# Patient Record
Sex: Female | Born: 1990 | Race: Black or African American | Hispanic: No | Marital: Married | Smoking: Never smoker
Health system: Southern US, Community
[De-identification: ages and names within clinical notes are randomized; demographics above are authoritative.]

## PROBLEM LIST (undated history)

## (undated) ENCOUNTER — Inpatient Hospital Stay (HOSPITAL_COMMUNITY): Payer: Self-pay

## (undated) DIAGNOSIS — J302 Other seasonal allergic rhinitis: Secondary | ICD-10-CM

## (undated) DIAGNOSIS — G4730 Sleep apnea, unspecified: Secondary | ICD-10-CM

## (undated) DIAGNOSIS — M1990 Unspecified osteoarthritis, unspecified site: Secondary | ICD-10-CM

## (undated) DIAGNOSIS — N189 Chronic kidney disease, unspecified: Secondary | ICD-10-CM

## (undated) DIAGNOSIS — F419 Anxiety disorder, unspecified: Secondary | ICD-10-CM

## (undated) DIAGNOSIS — R011 Cardiac murmur, unspecified: Secondary | ICD-10-CM

## (undated) DIAGNOSIS — Z8619 Personal history of other infectious and parasitic diseases: Secondary | ICD-10-CM

## (undated) DIAGNOSIS — J45909 Unspecified asthma, uncomplicated: Secondary | ICD-10-CM

## (undated) DIAGNOSIS — I10 Essential (primary) hypertension: Secondary | ICD-10-CM

## (undated) DIAGNOSIS — E119 Type 2 diabetes mellitus without complications: Secondary | ICD-10-CM

## (undated) DIAGNOSIS — F32A Depression, unspecified: Secondary | ICD-10-CM

## (undated) DIAGNOSIS — K219 Gastro-esophageal reflux disease without esophagitis: Secondary | ICD-10-CM

## (undated) DIAGNOSIS — F909 Attention-deficit hyperactivity disorder, unspecified type: Secondary | ICD-10-CM

## (undated) DIAGNOSIS — D49511 Neoplasm of unspecified behavior of right kidney: Secondary | ICD-10-CM

## (undated) DIAGNOSIS — T8859XA Other complications of anesthesia, initial encounter: Secondary | ICD-10-CM

## (undated) DIAGNOSIS — E785 Hyperlipidemia, unspecified: Secondary | ICD-10-CM

## (undated) DIAGNOSIS — K760 Fatty (change of) liver, not elsewhere classified: Secondary | ICD-10-CM

## (undated) DIAGNOSIS — E559 Vitamin D deficiency, unspecified: Secondary | ICD-10-CM

## (undated) HISTORY — PX: ABDOMINAL HYSTERECTOMY: SHX81

## (undated) HISTORY — PX: TUBAL LIGATION: SHX77

## (undated) HISTORY — PX: ESOPHAGOGASTRODUODENOSCOPY: SHX1529

## (undated) HISTORY — DX: Neoplasm of unspecified behavior of right kidney: D49.511

## (undated) HISTORY — DX: Personal history of other infectious and parasitic diseases: Z86.19

## (undated) HISTORY — PX: TONSILLECTOMY: SUR1361

## (undated) HISTORY — PX: UPPER GASTROINTESTINAL ENDOSCOPY: SHX188

## (undated) HISTORY — DX: Attention-deficit hyperactivity disorder, unspecified type: F90.9

## (undated) HISTORY — DX: Essential (primary) hypertension: I10

## (undated) HISTORY — PX: CHOLECYSTECTOMY: SHX55

## (undated) HISTORY — DX: Hyperlipidemia, unspecified: E78.5

---
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
## 2010-06-23 ENCOUNTER — Encounter: Payer: Self-pay | Admitting: Family Medicine

## 2010-06-25 ENCOUNTER — Other Ambulatory Visit (HOSPITAL_COMMUNITY)
Admission: RE | Admit: 2010-06-25 | Discharge: 2010-06-25 | Disposition: A | Discharge status: Home / Self Care | Payer: Medicaid Other | Source: Ambulatory Visit | Attending: Obstetrics and Gynecology | Admitting: Obstetrics and Gynecology

## 2010-06-25 ENCOUNTER — Other Ambulatory Visit: Payer: Self-pay | Admitting: Obstetrics and Gynecology

## 2010-06-25 ENCOUNTER — Encounter (INDEPENDENT_AMBULATORY_CARE_PROVIDER_SITE_OTHER): Payer: Self-pay | Admitting: Obstetrics and Gynecology

## 2010-06-25 DIAGNOSIS — R87619 Unspecified abnormal cytological findings in specimens from cervix uteri: Secondary | ICD-10-CM | POA: Insufficient documentation

## 2010-06-25 LAB — POCT PREGNANCY, URINE: Preg Test, Ur: NEGATIVE

## 2010-07-16 ENCOUNTER — Ambulatory Visit: Payer: Self-pay | Admitting: Physician Assistant

## 2010-07-16 DIAGNOSIS — R87612 Low grade squamous intraepithelial lesion on cytologic smear of cervix (LGSIL): Secondary | ICD-10-CM

## 2015-10-19 DIAGNOSIS — R1012 Left upper quadrant pain: Secondary | ICD-10-CM | POA: Diagnosis not present

## 2015-10-19 DIAGNOSIS — R1084 Generalized abdominal pain: Secondary | ICD-10-CM | POA: Diagnosis not present

## 2015-11-16 ENCOUNTER — Ambulatory Visit: Payer: 59 | Admitting: General Practice

## 2015-11-16 ENCOUNTER — Encounter: Payer: Self-pay | Admitting: Family Medicine

## 2015-11-16 DIAGNOSIS — Z3201 Encounter for pregnancy test, result positive: Secondary | ICD-10-CM

## 2015-11-16 LAB — POCT PREGNANCY, URINE: Preg Test, Ur: POSITIVE — AB

## 2015-11-16 NOTE — Progress Notes (Signed)
Patient here for UPT today. UPT positive. Patient reports first positive test 11/08/15. LMP 10/10/15 EDD 07/16/16. Patient reports having nexplanon removed in February-March and August was her first period. Patient states she has type 2 diabetes and is currently not on any medications or vitamins. Patient states she was on metformin in the past but when she got pregnant she had to be placed on insulin. Patient reports fasting 135ish. Recommended to patient she come see Maggie our diabetes educator in 2 weeks and to see Diane that day for ultrasound confirmation to ensure she is not further along. New OB visit in 5-6 weeks. Patient verbalized understanding to all. Provided new OB packet & recommended she begin PNV. Patient had no questions

## 2015-11-27 ENCOUNTER — Other Ambulatory Visit (HOSPITAL_COMMUNITY): Payer: Self-pay | Admitting: Radiology

## 2015-11-27 ENCOUNTER — Emergency Department (HOSPITAL_COMMUNITY)
Admission: EM | Admit: 2015-11-27 | Discharge: 2015-11-28 | Disposition: A | Discharge status: Home / Self Care | Payer: 59 | Attending: Emergency Medicine | Admitting: Emergency Medicine

## 2015-11-27 ENCOUNTER — Emergency Department (HOSPITAL_COMMUNITY): Payer: 59

## 2015-11-27 ENCOUNTER — Encounter (HOSPITAL_COMMUNITY): Payer: Self-pay

## 2015-11-27 DIAGNOSIS — R110 Nausea: Secondary | ICD-10-CM | POA: Diagnosis not present

## 2015-11-27 DIAGNOSIS — E119 Type 2 diabetes mellitus without complications: Secondary | ICD-10-CM | POA: Diagnosis not present

## 2015-11-27 DIAGNOSIS — Z3A01 Less than 8 weeks gestation of pregnancy: Secondary | ICD-10-CM | POA: Diagnosis not present

## 2015-11-27 DIAGNOSIS — R102 Pelvic and perineal pain: Secondary | ICD-10-CM | POA: Diagnosis not present

## 2015-11-27 DIAGNOSIS — O219 Vomiting of pregnancy, unspecified: Secondary | ICD-10-CM | POA: Diagnosis not present

## 2015-11-27 DIAGNOSIS — O24311 Unspecified pre-existing diabetes mellitus in pregnancy, first trimester: Secondary | ICD-10-CM | POA: Diagnosis not present

## 2015-11-27 DIAGNOSIS — R072 Precordial pain: Secondary | ICD-10-CM | POA: Diagnosis not present

## 2015-11-27 DIAGNOSIS — O26891 Other specified pregnancy related conditions, first trimester: Secondary | ICD-10-CM | POA: Diagnosis present

## 2015-11-27 DIAGNOSIS — O99411 Diseases of the circulatory system complicating pregnancy, first trimester: Secondary | ICD-10-CM | POA: Diagnosis not present

## 2015-11-27 DIAGNOSIS — O3481 Maternal care for other abnormalities of pelvic organs, first trimester: Secondary | ICD-10-CM | POA: Diagnosis not present

## 2015-11-27 HISTORY — DX: Type 2 diabetes mellitus without complications: E11.9

## 2015-11-27 LAB — COMPREHENSIVE METABOLIC PANEL
ALT: 29 U/L (ref 14–54)
ANION GAP: 9 (ref 5–15)
AST: 23 U/L (ref 15–41)
Albumin: 4.1 g/dL (ref 3.5–5.0)
Alkaline Phosphatase: 58 U/L (ref 38–126)
BUN: 7 mg/dL (ref 6–20)
CHLORIDE: 104 mmol/L (ref 101–111)
CO2: 22 mmol/L (ref 22–32)
CREATININE: 0.47 mg/dL (ref 0.44–1.00)
Calcium: 9 mg/dL (ref 8.9–10.3)
Glucose, Bld: 148 mg/dL — ABNORMAL HIGH (ref 65–99)
Potassium: 3.7 mmol/L (ref 3.5–5.1)
SODIUM: 135 mmol/L (ref 135–145)
Total Bilirubin: 0.4 mg/dL (ref 0.3–1.2)
Total Protein: 7.3 g/dL (ref 6.5–8.1)

## 2015-11-27 LAB — CBC WITH DIFFERENTIAL/PLATELET
BASOS PCT: 0 %
Basophils Absolute: 0 10*3/uL (ref 0.0–0.1)
EOS ABS: 0.2 10*3/uL (ref 0.0–0.7)
EOS PCT: 1 %
HCT: 41.7 % (ref 36.0–46.0)
HEMOGLOBIN: 13.5 g/dL (ref 12.0–15.0)
Lymphocytes Relative: 31 %
Lymphs Abs: 4 10*3/uL (ref 0.7–4.0)
MCH: 27.7 pg (ref 26.0–34.0)
MCHC: 32.4 g/dL (ref 30.0–36.0)
MCV: 85.5 fL (ref 78.0–100.0)
MONOS PCT: 6 %
Monocytes Absolute: 0.8 10*3/uL (ref 0.1–1.0)
NEUTROS PCT: 62 %
Neutro Abs: 8 10*3/uL — ABNORMAL HIGH (ref 1.7–7.7)
PLATELETS: 257 10*3/uL (ref 150–400)
RBC: 4.88 MIL/uL (ref 3.87–5.11)
RDW: 13.8 % (ref 11.5–15.5)
WBC: 13 10*3/uL — ABNORMAL HIGH (ref 4.0–10.5)

## 2015-11-27 LAB — LIPASE, BLOOD: LIPASE: 19 U/L (ref 11–51)

## 2015-11-27 LAB — I-STAT TROPONIN, ED: TROPONIN I, POC: 0 ng/mL (ref 0.00–0.08)

## 2015-11-27 LAB — I-STAT CG4 LACTIC ACID, ED: Lactic Acid, Venous: 1.56 mmol/L (ref 0.5–1.9)

## 2015-11-27 LAB — I-STAT BETA HCG BLOOD, ED (MC, WL, AP ONLY)

## 2015-11-27 MED ORDER — ONDANSETRON HCL 4 MG PO TABS: 4.0000 mg | ORAL_TABLET | Freq: Four times a day (QID) | ORAL | 0 refills | Status: DC

## 2015-11-27 MED ORDER — ONDANSETRON 4 MG PO TBDP
4.0000 mg | ORAL_TABLET | Freq: Once | ORAL | Status: AC
  Administered 2015-11-27: 4 mg via ORAL
  Filled 2015-11-27: qty 1

## 2015-11-27 MED ORDER — ACETAMINOPHEN 325 MG PO TABS
650.0000 mg | ORAL_TABLET | Freq: Once | ORAL | Status: AC
  Administered 2015-11-27: 650 mg via ORAL
  Filled 2015-11-27: qty 2

## 2015-11-27 NOTE — ED Triage Notes (Signed)
Pt presents with onset of mid-sternal chest pain  That began this morning.  +shortness of breath and diaphoresis.  Pt is pregnant, LMP 8/12

## 2015-11-27 NOTE — Discharge Instructions (Signed)
Return to [facility] for [place] and HCG in 14 days or arrange one with your OB-GYN doctor

## 2015-11-27 NOTE — Discharge Planning (Signed)
EDCM reviewed discharging chart for possible CM needs.  No needs identified.    

## 2015-11-27 NOTE — ED Provider Notes (Signed)
[place] DEPT Provider Note   CSN: [id number] Arrival date & time: 11/27/15  [id number]     History   Chief Complaint Chief Complaint  Patient presents with  . Chest Pain    HPI Michelle Lamb is a 25 y.o. female.  HPI Patient presents emergency room with some midsternal chest pain which began this morning.  Patient is currently rock's May 6 [redacted] weeks pregnant.  She has no history of cardiac disease.  Denies nausea or diaphoresis.  Said she had some mild shortness of breath.  Also has had slight headache.  Denies fever chills. Past Medical History:  Diagnosis Date  . Diabetes mellitus without complication ([place])     There are no active problems to display for this patient.   Past Surgical History:  Procedure Laterality Date  . CHOLECYSTECTOMY    . TONSILLECTOMY      OB History    Gravida Para Term Preterm AB Living   1             SAB TAB Ectopic Multiple Live Births                   Home Medications    Prior to Admission medications   Medication Sig Start Date End Date Taking? Authorizing Provider  ondansetron (ZOFRAN) 4 MG tablet Take 1 tablet (4 mg total) by mouth every 6 (six) hours. 11/27/15   Leonard Schwartz, MD    Family History History reviewed. No pertinent family history.  Social History Social History  Substance Use Topics  . Smoking status: Never Smoker  . Smokeless tobacco: Never Used  . Alcohol use No     Allergies   Review of patient's allergies indicates no known allergies.   Review of Systems Review of Systems  All other systems reviewed and are negative Physical Exam Updated Vital Signs BP 115/62 (BP Location: Right Arm)   Pulse 67   Temp 98.7 F (37.1 C) (Oral)   Resp 20   Ht 5\' 3"  (1.6 m)   Wt 284 lb (128.8 kg)   LMP 10/10/2015 (Exact Date)   SpO2 100%   BMI 50.31 kg/m   Physical Exam  Physical Exam  Nursing note and vitals reviewed. Constitutional: She is oriented to person, place, and time. She appears  well-developed and well-nourished. No distress.  HENT:  Head: Normocephalic and atraumatic.  Eyes: Pupils are equal, round, and reactive to light.  Neck: Normal range of motion.  Cardiovascular: Normal rate and intact distal pulses.   Pulmonary/Chest: No respiratory distress.  Abdominal: Normal appearance.  Patient has a gravid uterus.  No focal tenderness to palpation.   Musculoskeletal: Normal range of motion.  Neurological: She is alert and oriented to person, place, and time. No cranial nerve deficit.  Skin: Skin is warm and dry. No rash noted.  Psychiatric: She has a normal mood and affect. Her behavior is normal.   ED Treatments / Results  Labs (all labs ordered are listed, but only abnormal results are displayed) Labs Reviewed  COMPREHENSIVE METABOLIC PANEL - Abnormal; Notable for the following:       Result Value   Glucose, Bld 148 (*)    All other components within normal limits  CBC WITH DIFFERENTIAL/PLATELET - Abnormal; Notable for the following:    WBC 13.0 (*)    Neutro Abs 8.0 (*)    All other components within normal limits  I-STAT BETA HCG BLOOD, ED (MC, [facility], AP ONLY) - Abnormal; Notable for the following:  I-stat hCG, quantitative >2,000.0 (*)    All other components within normal limits  LIPASE, BLOOD  I-STAT TROPOININ, ED  I-STAT CG4 LACTIC ACID, ED    EKG  EKG Interpretation  Date/Time:  Friday November 27 2015 09:54:52 EDT Ventricular Rate:  98 PR Interval:    QRS Duration: 87 QT Interval:  328 QTC Calculation: 419 R Axis:   14 Text Interpretation:  Sinus rhythm Low voltage, precordial leads Baseline wander in lead(s) V3 V4 V5 V6 No previous tracing Confirmed by Yamilet Mcfayden  MD, Billyjoe Go ([id number]) on 11/27/2015 9:56:56 AM       Radiology US Ob Comp Less 14 Wks  Result Date: 11/27/2015 CLINICAL DATA:  Pelvic pain. EXAM: OBSTETRIC <14 WK [place] AND TRANSVAGINAL OB US TECHNIQUE: Both transabdominal and transvaginal ultrasound examinations were performed for  complete evaluation of the gestation as well as the maternal uterus, adnexal regions, and pelvic cul-de-sac. Transvaginal technique was performed to assess early pregnancy. COMPARISON:  No prior . FINDINGS: Intrauterine gestational sac: Single Yolk sac:  Not present Embryo:  Not present Cardiac Activity: Not present MSD: 1.3 cm 6 w   1  d Subchorionic hemorrhage: Tiny subchorionic hemorrhage cannot be excluded. Maternal uterus/adnexae: Small 1.5 cm small cyst right ovary, most likely corpus luteal cyst IMPRESSION: 1. Intrauterine gestational sac. No fetal pole noted. Probable early intrauterine gestational sac, but no yolk sac, fetal pole, or cardiac activity yet visualized. Recommend follow-up quantitative B-HCG levels and follow-up US in 14 days to confirm and assess viability and to exclude ectopic pregnancy. This recommendation follows SRU consensus guidelines: Diagnostic Criteria for Nonviable Pregnancy Early in the First Trimester. [place] Med [id number]. 2.  Tiny subchorionic hemorrhage cannot be excluded. 3. 1.5 cm small complex cyst right ovary, most likely corpus luteal cyst. Electronically Signed   By: Marcello Moores  Register   On: 11/27/2015 12:54   US Ob Transvaginal  Result Date: 11/27/2015 CLINICAL DATA:  Pelvic pain. EXAM: OBSTETRIC <14 WK [place] AND TRANSVAGINAL OB US TECHNIQUE: Both transabdominal and transvaginal ultrasound examinations were performed for complete evaluation of the gestation as well as the maternal uterus, adnexal regions, and pelvic cul-de-sac. Transvaginal technique was performed to assess early pregnancy. COMPARISON:  No prior . FINDINGS: Intrauterine gestational sac: Single Yolk sac:  Not present Embryo:  Not present Cardiac Activity: Not present MSD: 1.3 cm 6 w   1  d Subchorionic hemorrhage: Tiny subchorionic hemorrhage cannot be excluded. Maternal uterus/adnexae: Small 1.5 cm small cyst right ovary, most likely corpus luteal cyst IMPRESSION: 1. Intrauterine gestational  sac. No fetal pole noted. Probable early intrauterine gestational sac, but no yolk sac, fetal pole, or cardiac activity yet visualized. Recommend follow-up quantitative B-HCG levels and follow-up US in 14 days to confirm and assess viability and to exclude ectopic pregnancy. This recommendation follows SRU consensus guidelines: Diagnostic Criteria for Nonviable Pregnancy Early in the First Trimester. [place] [employer] Med [id number]. 2.  Tiny subchorionic hemorrhage cannot be excluded. 3. 1.5 cm small complex cyst right ovary, most likely corpus luteal cyst. Electronically Signed   By: Marcello Moores  Register   On: 11/27/2015 12:54    Procedures Procedures (including critical care time)  Medications Ordered in ED Medications  acetaminophen (TYLENOL) tablet 650 mg (650 mg Oral Given 11/27/15 1010)  ondansetron (ZOFRAN-ODT) disintegrating tablet 4 mg (4 mg Oral Given 11/27/15 1025)  ondansetron (ZOFRAN-ODT) disintegrating tablet 4 mg (4 mg Oral Given 11/27/15 1318)     Initial Impression / Assessment and Plan /  ED Course  I have reviewed the triage vital signs and the nursing notes.  Pertinent labs & imaging results that were available during my care of the patient were reviewed by me and considered in my medical decision making (see chart for details).  Clinical Course  Discussed the findings of the ultrasound with patient.  Instructed her to follow-up with her OB/GYN doctor for repeat ultrasound in 2 weeks or go to maternity admissions unit at [facility] hospital here in [place] for repeat ultrasound.    Final Clinical Impressions(s) / ED Diagnoses   Final diagnoses:  Nausea and vomiting of pregnancy, antepartum    New Prescriptions New Prescriptions   ONDANSETRON (ZOFRAN) 4 MG TABLET    Take 1 tablet (4 mg total) by mouth every 6 (six) hours.     Leonard Schwartz, MD 11/27/15 1324

## 2015-11-30 ENCOUNTER — Ambulatory Visit: Payer: 59 | Admitting: *Deleted

## 2015-11-30 ENCOUNTER — Encounter: Payer: 59 | Attending: Obstetrics and Gynecology | Admitting: Dietician

## 2015-11-30 DIAGNOSIS — O24119 Pre-existing diabetes mellitus, type 2, in pregnancy, unspecified trimester: Secondary | ICD-10-CM

## 2015-11-30 DIAGNOSIS — Z713 Dietary counseling and surveillance: Secondary | ICD-10-CM | POA: Diagnosis not present

## 2015-11-30 MED ORDER — GLUCOSE BLOOD VI STRP: ORAL_STRIP | 12 refills | Status: DC

## 2015-11-30 MED ORDER — BLOOD GLUCOSE METER KIT: PACK | 0 refills | Status: DC

## 2015-11-30 MED ORDER — ACCU-CHEK SOFT TOUCH LANCETS MISC: 12 refills | Status: DC

## 2015-11-30 NOTE — Progress Notes (Signed)
Michelle Lamb is a 25 yr old G32P3.  Notes she had GDM with her third child 22 months ago.  Currently working full time as a Quarry manager at [employer] " a lot of nausea and limited things that I can eat.  Finding that she is having palpations at work and was in the ED last Thursday (09/28). Notes that she developed type 2 diabetes following her delivery, and her diabetes is treated by Metformin.  Has stopped taking the Metformin.  I related that Metformin is used by ladies with GDM in our clinic and to plan to discuss medication with her MD at next visit.  She is to call the clinic and if her fasting glucose levels stay elevated above 90, she is to ask for an earlier appointment to see the MD. Last pregnancy, was followed by a practice in [place].   Completed review of GDM and its effects on the baby and mom. Review of the post partem self-care measures to help with weight loss and prevention of or the limiting of type 2 DM. Completed review of factors affecting blood glucose. Recommended walking 15 minutes after each meal. Review of blood glucose monitoring procedure.  She has obtained pregnancy Medicaid and we reviewed the Accu-Chek Aviva Plus meter.  Will have prescription posted to the [facility] in [place] on [address] for the meter, strips and lancets. Instructed to monitor fasting and 2 hr postprandial glucose levels,  Record and to bring her meter and glucose log to all clinic appointments. On random check this morning fasting level is 143 mg/dl.  She had a large fried dinner late last evening. Completed review of the carb restricted diet, carb counting and meal scheduling.  Tried to relate these factors in light of the nausea she is experiencing. Will follow as needed. Michelle Brae Gartman, RN, RD, LDN

## 2015-12-01 ENCOUNTER — Telehealth: Payer: Self-pay | Admitting: *Deleted

## 2015-12-01 NOTE — Telephone Encounter (Signed)
Patient left message on nurse line. Stated she was in [facility] ED on Fri for chest pain. They did and u/s and wanted her to f/u in 2 weeks for another. Wants to know if this can wait till the 30th when she has her doctor appointment if possible. Please return call.

## 2015-12-10 NOTE — Telephone Encounter (Signed)
Called patient, no answer- left message stating we are trying to reach you to see if you still need assistance, if so please call back.

## 2015-12-18 ENCOUNTER — Telehealth: Payer: Self-pay | Admitting: *Deleted

## 2015-12-18 NOTE — Telephone Encounter (Signed)
Pt left message stating that she needs the flu shot for her job and wants to know if she can receive it prior to her new Ob appt on 10/30. She stated that a message can be left on her voice mail.  I called pt back and left message stating that she may call back on Monday to schedule appt for flu shot prior to her appt on 10/30 or she can wait and receive it on that [place].

## 2015-12-24 DIAGNOSIS — O200 Threatened abortion: Secondary | ICD-10-CM | POA: Diagnosis not present

## 2015-12-24 DIAGNOSIS — Z3A01 Less than 8 weeks gestation of pregnancy: Secondary | ICD-10-CM | POA: Diagnosis not present

## 2015-12-24 DIAGNOSIS — Z3A10 10 weeks gestation of pregnancy: Secondary | ICD-10-CM | POA: Diagnosis not present

## 2015-12-28 ENCOUNTER — Ambulatory Visit (INDEPENDENT_AMBULATORY_CARE_PROVIDER_SITE_OTHER): Payer: 59 | Admitting: Obstetrics and Gynecology

## 2015-12-28 ENCOUNTER — Ambulatory Visit: Payer: Self-pay

## 2015-12-28 ENCOUNTER — Other Ambulatory Visit: Payer: Self-pay | Admitting: Obstetrics and Gynecology

## 2015-12-28 ENCOUNTER — Ambulatory Visit (HOSPITAL_COMMUNITY)
Admission: RE | Admit: 2015-12-28 | Discharge: 2015-12-28 | Disposition: A | Discharge status: Home / Self Care | Payer: 59 | Source: Ambulatory Visit | Attending: Obstetrics and Gynecology | Admitting: Obstetrics and Gynecology

## 2015-12-28 ENCOUNTER — Telehealth: Payer: Self-pay | Admitting: Obstetrics and Gynecology

## 2015-12-28 ENCOUNTER — Encounter: Payer: Self-pay | Admitting: Obstetrics and Gynecology

## 2015-12-28 VITALS — BP 144/100 | HR 92 | Wt 297.6 lb

## 2015-12-28 DIAGNOSIS — O10911 Unspecified pre-existing hypertension complicating pregnancy, first trimester: Secondary | ICD-10-CM | POA: Insufficient documentation

## 2015-12-28 DIAGNOSIS — O0991 Supervision of high risk pregnancy, unspecified, first trimester: Secondary | ICD-10-CM

## 2015-12-28 DIAGNOSIS — O200 Threatened abortion: Secondary | ICD-10-CM | POA: Insufficient documentation

## 2015-12-28 DIAGNOSIS — O208 Other hemorrhage in early pregnancy: Secondary | ICD-10-CM | POA: Insufficient documentation

## 2015-12-28 DIAGNOSIS — O3680X0 Pregnancy with inconclusive fetal viability, not applicable or unspecified: Secondary | ICD-10-CM

## 2015-12-28 DIAGNOSIS — E669 Obesity, unspecified: Secondary | ICD-10-CM | POA: Diagnosis not present

## 2015-12-28 DIAGNOSIS — O24911 Unspecified diabetes mellitus in pregnancy, first trimester: Secondary | ICD-10-CM | POA: Diagnosis not present

## 2015-12-28 DIAGNOSIS — Z3A01 Less than 8 weeks gestation of pregnancy: Secondary | ICD-10-CM | POA: Diagnosis not present

## 2015-12-28 DIAGNOSIS — O99210 Obesity complicating pregnancy, unspecified trimester: Secondary | ICD-10-CM | POA: Diagnosis not present

## 2015-12-28 DIAGNOSIS — O0990 Supervision of high risk pregnancy, unspecified, unspecified trimester: Secondary | ICD-10-CM | POA: Insufficient documentation

## 2015-12-28 DIAGNOSIS — Z124 Encounter for screening for malignant neoplasm of cervix: Secondary | ICD-10-CM | POA: Diagnosis not present

## 2015-12-28 DIAGNOSIS — Z6841 Body Mass Index (BMI) 40.0 and over, adult: Secondary | ICD-10-CM | POA: Insufficient documentation

## 2015-12-28 DIAGNOSIS — Z113 Encounter for screening for infections with a predominantly sexual mode of transmission: Secondary | ICD-10-CM | POA: Diagnosis not present

## 2015-12-28 DIAGNOSIS — Z8619 Personal history of other infectious and parasitic diseases: Secondary | ICD-10-CM | POA: Insufficient documentation

## 2015-12-28 DIAGNOSIS — O99211 Obesity complicating pregnancy, first trimester: Secondary | ICD-10-CM | POA: Diagnosis not present

## 2015-12-28 DIAGNOSIS — O209 Hemorrhage in early pregnancy, unspecified: Secondary | ICD-10-CM | POA: Diagnosis not present

## 2015-12-28 DIAGNOSIS — N939 Abnormal uterine and vaginal bleeding, unspecified: Secondary | ICD-10-CM | POA: Diagnosis present

## 2015-12-28 HISTORY — DX: Body Mass Index (BMI) 40.0 and over, adult: Z684

## 2015-12-28 LAB — POCT URINALYSIS DIP (DEVICE)
BILIRUBIN URINE: NEGATIVE
LEUKOCYTES UA: NEGATIVE
Nitrite: POSITIVE — AB
Protein, ur: 100 mg/dL — AB
SPECIFIC GRAVITY, URINE: 1.025 (ref 1.005–1.030)
UROBILINOGEN UA: 1 mg/dL (ref 0.0–1.0)
pH: 5.5 (ref 5.0–8.0)

## 2015-12-28 LAB — TSH: TSH: 0.79 mIU/L

## 2015-12-28 IMAGING — US US OB TRANSVAGINAL
1 series · 15 of 28 positions shown · non-contrast
Comparison: [DATE]

CLINICAL DATA: Pelvic pain and cramping. Vaginal bleeding.
Threatened abortion. First trimester pregnancy with inconclusive
fetal viability.

EXAM:
TRANSVAGINAL OB ULTRASOUND
TECHNIQUE: Transvaginal ultrasound was performed for complete evaluation of the
gestation as well as the maternal uterus, adnexal regions, and
pelvic cul-de-sac.

[Series 1: us ob transvaginal · 15 of 61 slices shown]
[im 1/61]
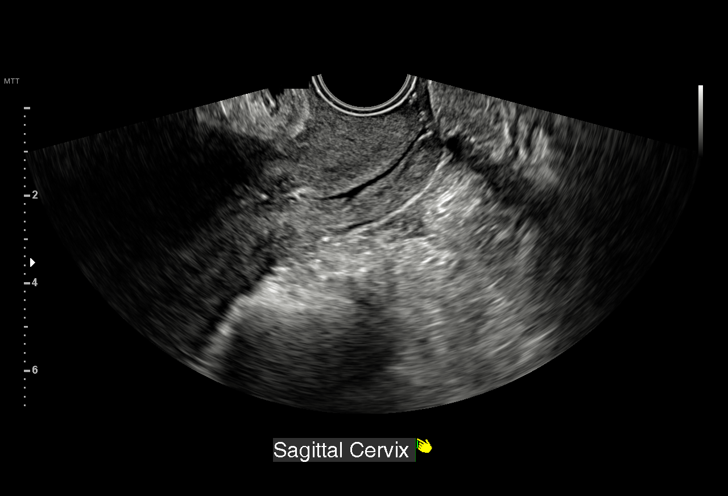
[im 5/61]
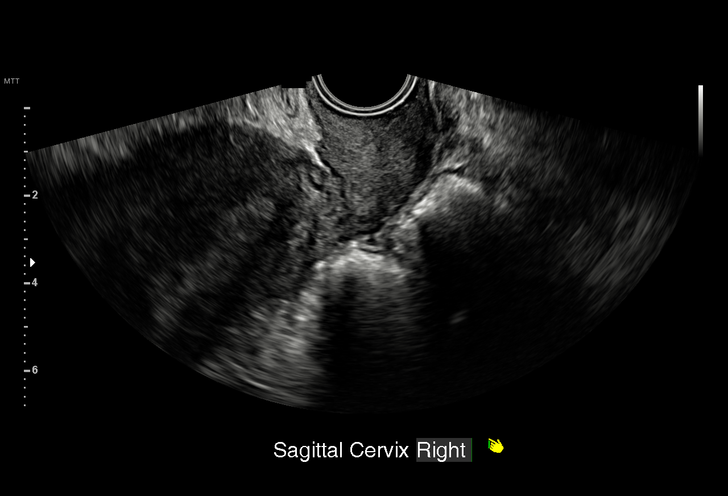
[im 9/61]
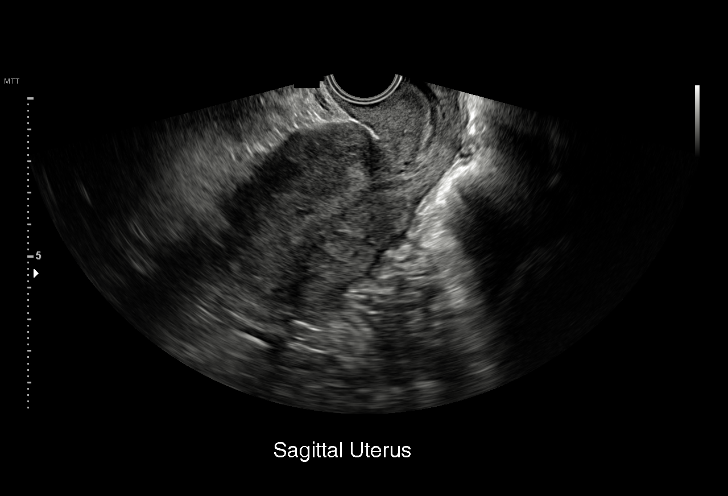
[im 14/61]
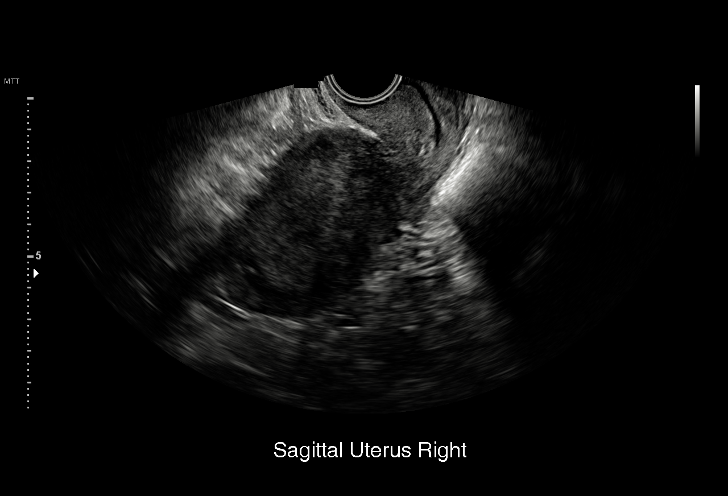
[im 18/61]
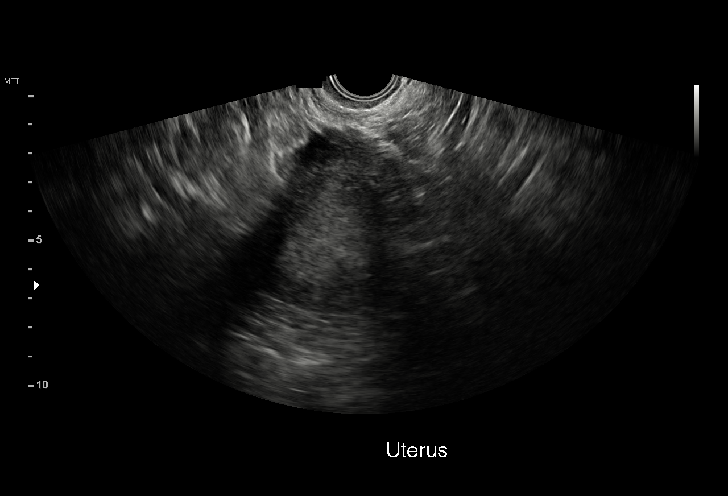
[im 23/61]
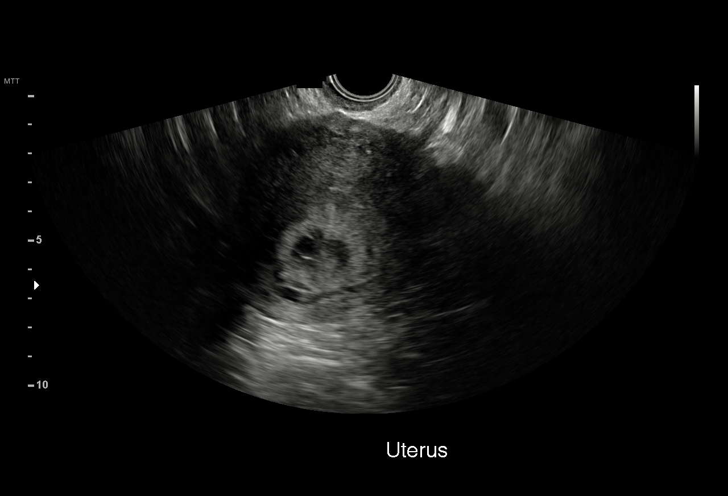
[im 27/61]
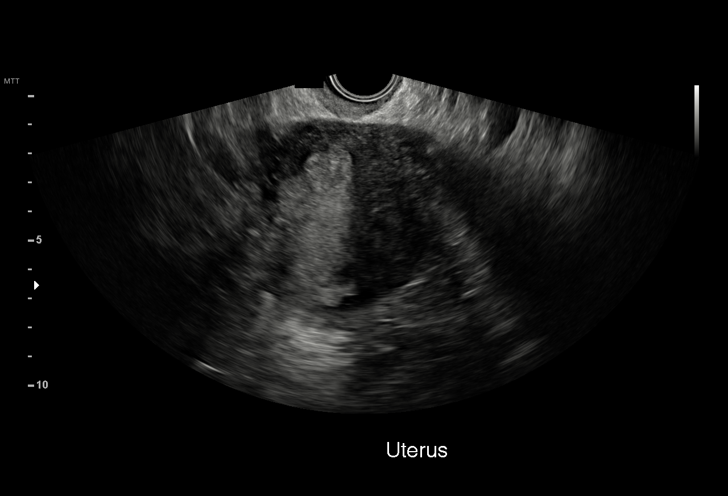
[im 32/61]
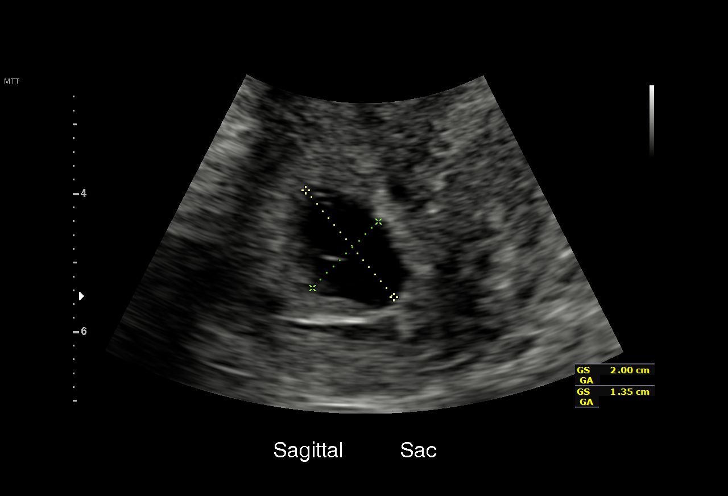
[im 34/61]
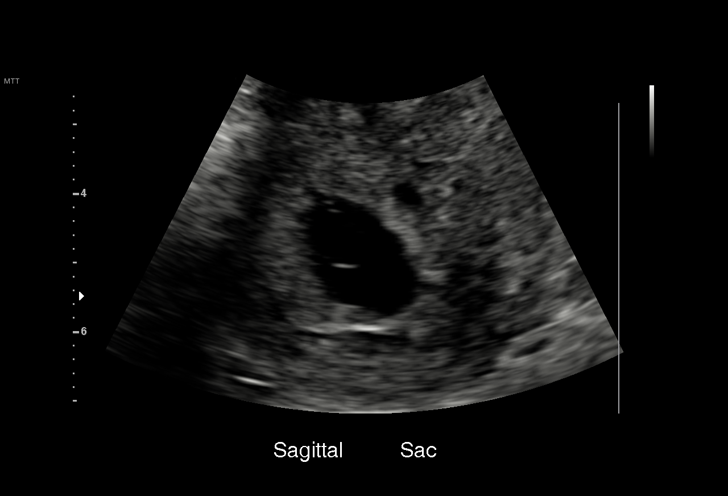
[im 38/61]
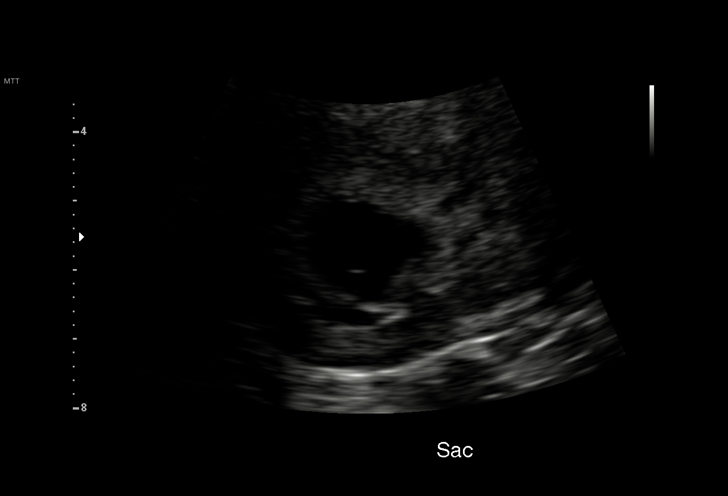
[im 43/61]
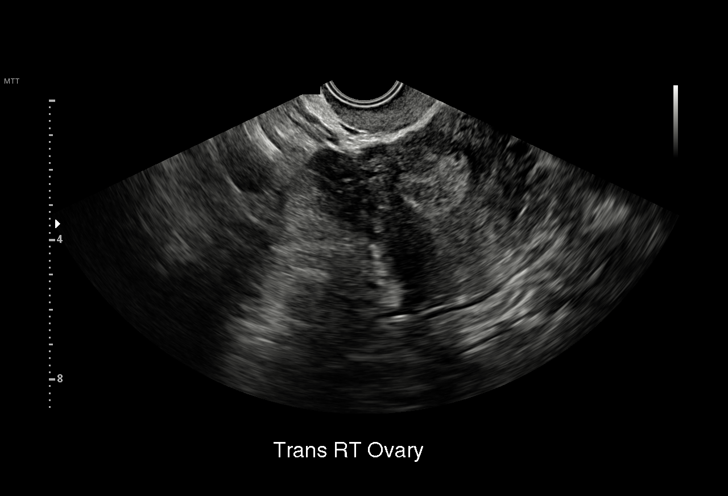
[im 47/61]
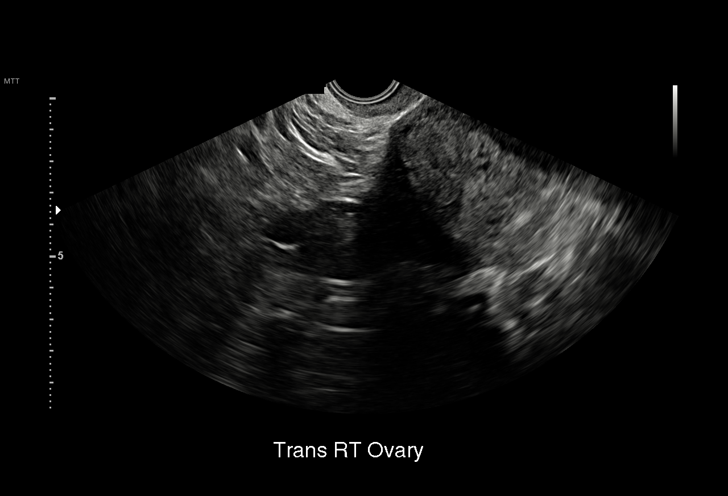
[im 52/61]
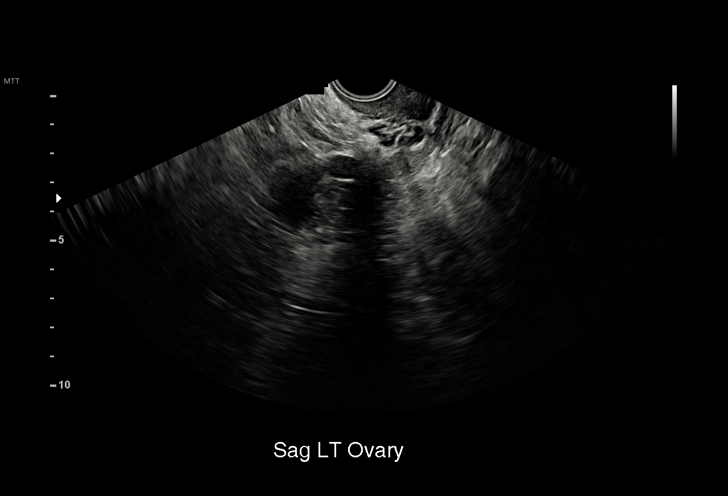
[im 56/61]
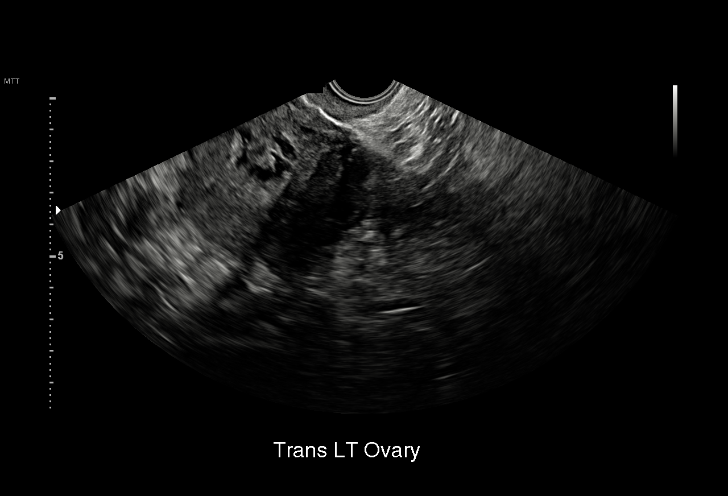
[im 61/61]
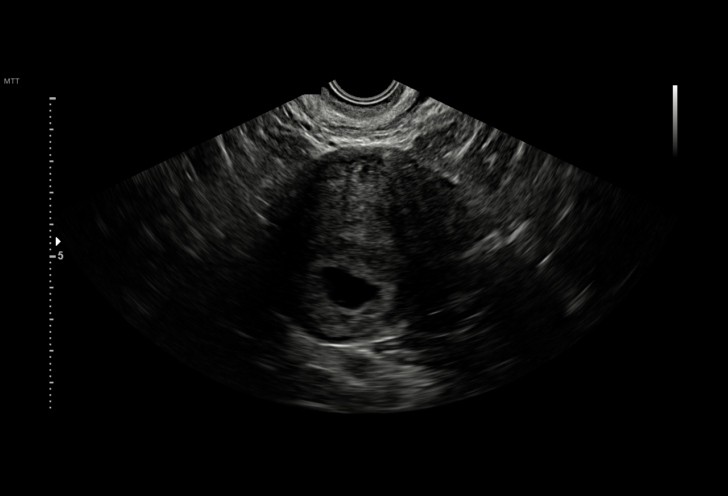

[15 of 28 positions shown; findings below may reference images not displayed]

FINDINGS: Intrauterine gestational sac: Single ; irregular shape

Yolk sac:  Abnormally enlarged yolk sac versus amnion

Embryo:  None visualized

MSD: 17  mm   6 w   4  d

Subchorionic hemorrhage: Small to moderate subchorionic hemorrhage
seen inferior to the gestational sac.

Maternal uterus/adnexae: Both ovaries are normal in appearance. No
mass or free fluid identified.
IMPRESSION: Single abnormal appearing intrauterine gestational sac shows lack of
interval progression and no embryo since prior study approximately 1
month ago. Findings meet definitive criteria for failed pregnancy.
This follows SRU consensus guidelines: Diagnostic Criteria for
Nonviable Pregnancy Early in the First Trimester. N Engl J Med

## 2015-12-28 MED ORDER — CEPHALEXIN 500 MG PO CAPS: 500.0000 mg | ORAL_CAPSULE | Freq: Four times a day (QID) | ORAL | 0 refills | Status: DC

## 2015-12-28 MED ORDER — CEPHALEXIN 500 MG PO CAPS
500.0000 mg | ORAL_CAPSULE | Freq: Three times a day (TID) | ORAL | 0 refills | Status: DC
Start: 2015-12-28 — End: 2015-12-28

## 2015-12-28 NOTE — Telephone Encounter (Signed)
GYN Telephone Note 12/28/2015 1547  D/w her re: failed pregnancy diagnosis and options including expectant vs medical vs surgical and she would like to do surgical.  Request sent to office for suction d&c scheduling and ER precautions given to her. She believes she is O pos and denies any h/o needing a shot b/c of her blood type.  Durene Romans MD Attending Center for [employer])

## 2015-12-28 NOTE — Progress Notes (Addendum)
New OB Note  12/31/2015   Clinic: Center for [facility]  Chief Complaint: NOB  Transfer of Care Patient: no  History of Present Illness: Ms. Stoffers is a 25 y.o. [id number] @ 11/2 weeks ([place] 07/16/16, based on Patient's last menstrual period was 10/10/2015 (exact date).=6wk u/s), with the above CC. Preg complicated by has Supervision of high-risk pregnancy; BMI 50.0-59.9, adult ([place]); Obesity in pregnancy; Chronic hypertension complicating or reason for care during pregnancy, first trimester; Diabetes mellitus type 2 affecting pregnancy in first trimester; Threatened abortion; and History of herpes simplex infection (serology only) on her problem list.   Her periods were: irregular after removal of her nexplanon in early 2017 She was using no method when she conceived.  She has Negative signs or symptoms of nausea/vomiting of pregnancy. She has Positive signs or symptoms of miscarriage or preterm labor: patient went to [facility] one week ago with non painful VB which is scant to small amount but continuing. She said that they drew blood and did an u/s but didn't tell her what they saw On any different medications around the time she conceived/early pregnancy: No  History of varicella: Yes  She denies any chest pain or SOB.  ROS: A 12-point review of systems was performed and negative, except as stated in the above HPI.  OBGYN History: As per HPI. OB History  Gravida Para Term Preterm AB Living  5 3 3   1 3   SAB TAB Ectopic Multiple Live Births  1       3    # Outcome Date GA Lbr Len/2nd Weight Sex Delivery Anes PTL Lv  5 Current           4 Term 02/12/14 [redacted]w[redacted]d  6 lb 12 oz (3.062 kg) F Vag-Spont EPI N LIV  3 SAB 2015          2 Term 08/06/08 [redacted]w[redacted]d  6 lb 5 oz (2.863 kg) F Vag-Spont EPI N LIV  1 Term 04/02/07 [redacted]w[redacted]d  5 lb 5 oz (2.41 kg) F Vag-Spont EPI Y LIV     Complications: Preeclampsia,Diabetes mellitus complicating pregnancy    Obstetric Comments  H/o GDM and HTN  in her last pregnancy    Any issues with any prior pregnancies: no Any prior children are healthy, doing well, without any problems or issues: yes History of pap smears: Yes. Last pap smear 2015 (negative). H/o CIN in 2012 History of STIs: Yes   Past Medical History: Past Medical History:  Diagnosis Date  . Benign essential HTN   . Diabetes mellitus without complication ([place])    TYPE 2 LAST DOSE FRIDAY OCTOBER 27 (METFORMIN)  . History of herpes simplex infection   . Hyperlipidemia     Past Surgical History: Past Surgical History:  Procedure Laterality Date  . CHOLECYSTECTOMY    . TONSILLECTOMY      Family History:  Family History  Problem Relation Age of Onset  . Hypertension Mother   . Cancer Maternal Grandmother     stomach cancer  . Depression Maternal Grandfather   . Cancer Maternal Grandfather     leukemia  . Depression Paternal Grandmother    She denies any female cancers, bleeding or blood clotting disorders.  She denies any history of mental retardation, birth defects or genetic disorders in her or the FOB's history  Social History:  Social History   Social History  . Marital status: Single    Spouse name: N/A  . Number of children: N/A  .  Years of education: N/A   Occupational History  . Not on file.   Social History Main Topics  . Smoking status: Never Smoker  . Smokeless tobacco: Never Used  . Alcohol use Yes     Comment: OCCASIONAL  . Drug use: No  . Sexual activity: Yes    Birth control/ protection: None     Comment: Nexplanon removed early 2017   Other Topics Concern  . Not on file   Social History Narrative  . No narrative on file  CNA with [employer]  Allergy: No Known Allergies  Health Maintenance:  Mammogram Up to Date: not applicable  Current Outpatient Medications: PNV  Physical Exam:   BP (!) 144/100   Pulse 92   Wt 297 lb 9.6 oz (135 kg)   LMP 10/10/2015 (Exact Date)   BMI 52.72 kg/m  Body mass index is 52.72  kg/m. FHTs: see below (none seen)  General appearance: Well nourished, well developed female in no acute distress.  Neck:  Supple, normal appearance, and no thyromegaly  Cardiovascular: S1, S2 normal, no murmur, rub or gallop, regular rate and rhythm Respiratory:  Clear to auscultation bilateral. Normal respiratory effort Abdomen: positive bowel sounds and no masses, hernias; diffusely non tender to palpation, non distended Breasts: breasts appear normal, no suspicious masses, no skin or nipple changes or axillary nodes, and normal inspection. Neuro/Psych:  Normal mood and affect.  Skin:  Warm and dry.  Lymphatic:  No inguinal lymphadenopathy.   Pelvic exam: is limited by body habitus EGBUS: within normal limits, Vagina: within normal limits and with scant amount of old pink mixed discharge  in the vault, Cervix: normal appearing cervix without discharge or lesions, closed/long/high, but no active bleeding. Cervix anterior and nttp Uterus:  Not palpable, and Adnexa:  normal adnexa and no mass, fullness, tenderness  Laboratory: none  Imaging:  As above. No records from [facility] from last week Bedside u/s (transabd) done after exam: ?GS but no fetus and uterus doesn't appear 11wks per ultrasonographer in clinic  Assessment: ?incomplete AB. Pt stable  Plan: 1. Supervision of high risk pregnancy in first trimester Routine care. Pt amenable to genetic screening but will hold off prenatal labs, etc until formal TVUS is done. +nitrites so will send in keflex - GC/Chlamydia probe amp ([place])not at [facility] - Urine Culture - Pain Mgmt, Profile 6 Conf w/o mM, U  2. BMI 50.0-59.9, adult ([facility]) - Protein / Creatinine Ratio, Urine - TSH  3.  Chronic hypertension complicating or reason for care during pregnancy, first trimester Patient states she was on labetalol in the past. Follow up BP at next visit and start meds PRN and based on u/s today Start baby ASA after 12wks if viable  pregnancy and no longer having bleeding Baseline cmp negative. Will get baseline PC ratio and TSH. 10/2015 ER ECG normal  4. Diabetes mellitus type 2 affecting pregnancy in first trimester Pt states supplies after early dm education visit not sent into pharmacy but patient didn't let us know Supplies sent in and RTC 1wk to f/u BS and BP - HgB A1c  5. Threatened abortion F/u viability scan today. D/w pt very concerning for miscarriage. Type and screen ordered. Will f/u results and call pt for plan of care discussion.   6. H/o HSV + serology Prodromal s/s precautions given Start ppx at 35-36wks  Problem list reviewed and updated.  Follow up in 1 weeks.  >50% of 30 min visit spent on counseling  and coordination of care.     Durene Romans MD Attending Center for [facility])

## 2015-12-28 NOTE — Progress Notes (Signed)
Initial prenatal labs today Prenatal education packet given Urine positive for nitrites

## 2015-12-29 ENCOUNTER — Telehealth: Payer: Self-pay | Admitting: General Practice

## 2015-12-29 LAB — PROTEIN / CREATININE RATIO, URINE
Creatinine, Urine: 120 mg/dL (ref 20–320)
Protein Creatinine Ratio: 292 mg/g creat — ABNORMAL HIGH (ref 21–161)
Total Protein, Urine: 35 mg/dL — ABNORMAL HIGH (ref 5–24)

## 2015-12-29 LAB — HEMOGLOBIN A1C
Hgb A1c MFr Bld: 7.2 % — ABNORMAL HIGH (ref <5.7)
Mean Plasma Glucose: 160 mg/dL

## 2015-12-29 LAB — GC/CHLAMYDIA PROBE AMP (~~LOC~~) NOT AT ARMC
CHLAMYDIA, DNA PROBE: NEGATIVE
NEISSERIA GONORRHEA: NEGATIVE

## 2015-12-29 LAB — ABO AND RH: RH TYPE: POSITIVE

## 2015-12-29 NOTE — Telephone Encounter (Signed)
Patient called into front office wanting to know when her surgery will be & also wanted to let someone know she has been feeling sick/nauseous all morning. Patient reports increase in pain & feeling hot flashes/cold all day. Spoke with Dr Kennon Rounds who recommended patient go to [facility] for evaluation. Discussed with patient. Patient verbalized understanding & had no questions

## 2015-12-30 ENCOUNTER — Other Ambulatory Visit: Payer: Self-pay | Admitting: Obstetrics and Gynecology

## 2015-12-30 ENCOUNTER — Encounter (HOSPITAL_COMMUNITY): Payer: Self-pay | Admitting: *Deleted

## 2015-12-30 LAB — PAIN MGMT, PROFILE 6 CONF W/O MM, U
6 ACETYLMORPHINE: NEGATIVE ng/mL (ref <10)
ALCOHOL METABOLITES: NEGATIVE ng/mL (ref <500)
Amphetamines: NEGATIVE ng/mL (ref <500)
BARBITURATES: NEGATIVE ng/mL (ref <300)
Benzodiazepines: NEGATIVE ng/mL (ref <100)
COCAINE METABOLITE: NEGATIVE ng/mL (ref <150)
Creatinine: 101.3 mg/dL (ref >=20.0)
METHADONE METABOLITE: NEGATIVE ng/mL (ref <100)
Marijuana Metabolite: NEGATIVE ng/mL (ref <20)
OXYCODONE: NEGATIVE ng/mL (ref <100)
Opiates: NEGATIVE ng/mL (ref <100)
Oxidant: NEGATIVE ug/mL (ref <200)
PH: 7 (ref 4.5–9.0)
PHENCYCLIDINE: NEGATIVE ng/mL (ref <25)
Please note:: 0

## 2015-12-30 LAB — URINE CULTURE

## 2015-12-31 ENCOUNTER — Encounter (HOSPITAL_COMMUNITY): Payer: Self-pay | Admitting: *Deleted

## 2015-12-31 ENCOUNTER — Encounter (HOSPITAL_COMMUNITY)
Admission: RE | Disposition: A | Discharge status: Home / Self Care | Payer: Self-pay | Source: Ambulatory Visit | Attending: Obstetrics and Gynecology

## 2015-12-31 ENCOUNTER — Ambulatory Visit (HOSPITAL_COMMUNITY): Payer: 59 | Admitting: Anesthesiology

## 2015-12-31 ENCOUNTER — Ambulatory Visit (HOSPITAL_COMMUNITY): Payer: 59

## 2015-12-31 ENCOUNTER — Ambulatory Visit (HOSPITAL_COMMUNITY)
Admission: RE | Admit: 2015-12-31 | Discharge: 2015-12-31 | Disposition: A | Discharge status: Home / Self Care | Payer: 59 | Source: Ambulatory Visit | Attending: Obstetrics and Gynecology | Admitting: Obstetrics and Gynecology

## 2015-12-31 DIAGNOSIS — O021 Missed abortion: Secondary | ICD-10-CM | POA: Diagnosis not present

## 2015-12-31 DIAGNOSIS — O034 Incomplete spontaneous abortion without complication: Secondary | ICD-10-CM | POA: Insufficient documentation

## 2015-12-31 DIAGNOSIS — O26891 Other specified pregnancy related conditions, first trimester: Secondary | ICD-10-CM | POA: Insufficient documentation

## 2015-12-31 DIAGNOSIS — Z3A11 11 weeks gestation of pregnancy: Secondary | ICD-10-CM | POA: Diagnosis not present

## 2015-12-31 DIAGNOSIS — O2341 Unspecified infection of urinary tract in pregnancy, first trimester: Secondary | ICD-10-CM | POA: Diagnosis not present

## 2015-12-31 DIAGNOSIS — O24911 Unspecified diabetes mellitus in pregnancy, first trimester: Secondary | ICD-10-CM | POA: Diagnosis not present

## 2015-12-31 DIAGNOSIS — Z3A01 Less than 8 weeks gestation of pregnancy: Secondary | ICD-10-CM | POA: Insufficient documentation

## 2015-12-31 DIAGNOSIS — Z6841 Body Mass Index (BMI) 40.0 and over, adult: Secondary | ICD-10-CM | POA: Diagnosis not present

## 2015-12-31 DIAGNOSIS — O99211 Obesity complicating pregnancy, first trimester: Secondary | ICD-10-CM | POA: Diagnosis not present

## 2015-12-31 DIAGNOSIS — E785 Hyperlipidemia, unspecified: Secondary | ICD-10-CM | POA: Diagnosis not present

## 2015-12-31 DIAGNOSIS — O161 Unspecified maternal hypertension, first trimester: Secondary | ICD-10-CM | POA: Diagnosis not present

## 2015-12-31 DIAGNOSIS — N719 Inflammatory disease of uterus, unspecified: Secondary | ICD-10-CM | POA: Diagnosis not present

## 2015-12-31 HISTORY — PX: DILATION AND CURETTAGE OF UTERUS: SHX78

## 2015-12-31 LAB — GLUCOSE, CAPILLARY
GLUCOSE-CAPILLARY: 111 mg/dL — AB (ref 65–99)
Glucose-Capillary: 127 mg/dL — ABNORMAL HIGH (ref 65–99)

## 2015-12-31 LAB — CBC
HCT: 38.6 % (ref 36.0–46.0)
Hemoglobin: 13.1 g/dL (ref 12.0–15.0)
MCH: 28 pg (ref 26.0–34.0)
MCHC: 33.9 g/dL (ref 30.0–36.0)
MCV: 82.5 fL (ref 78.0–100.0)
PLATELETS: 239 10*3/uL (ref 150–400)
RBC: 4.68 MIL/uL (ref 3.87–5.11)
RDW: 13.9 % (ref 11.5–15.5)
WBC: 12 10*3/uL — AB (ref 4.0–10.5)

## 2015-12-31 IMAGING — US US OB TRANSVAGINAL
1 series · 15 of 25 positions shown · non-contrast
Comparison: [DATE]

CLINICAL DATA: Incomplete abortion. By LMP patient is 11 weeks 3
days. LMP [DATE]. Patient is gravida 5 para 3 AB 1.

EXAM:
TRANSVAGINAL OB ULTRASOUND
TECHNIQUE: Transvaginal ultrasound was performed for complete evaluation of the
gestation as well as the maternal uterus, adnexal regions, and
pelvic cul-de-sac.

[Series 1: us ob transvaginal · 15 of 25 slices shown]
[im 1/25]
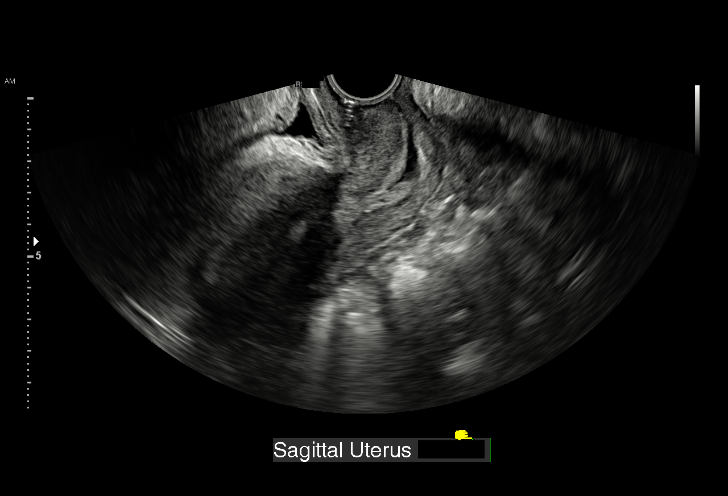
[im 3/25]
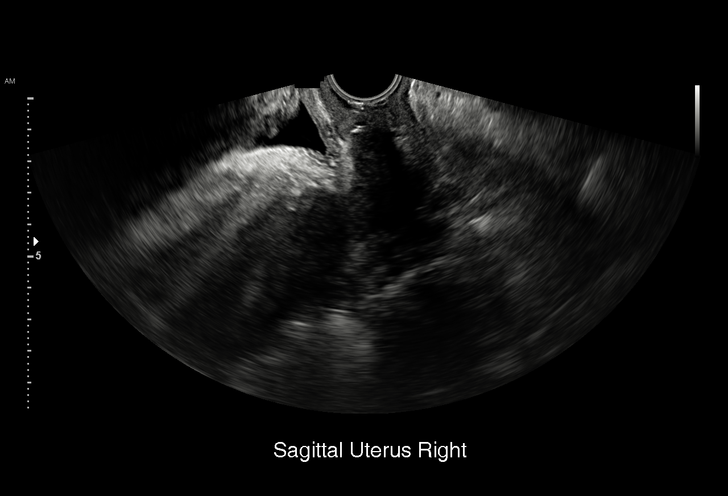
[im 5/25]
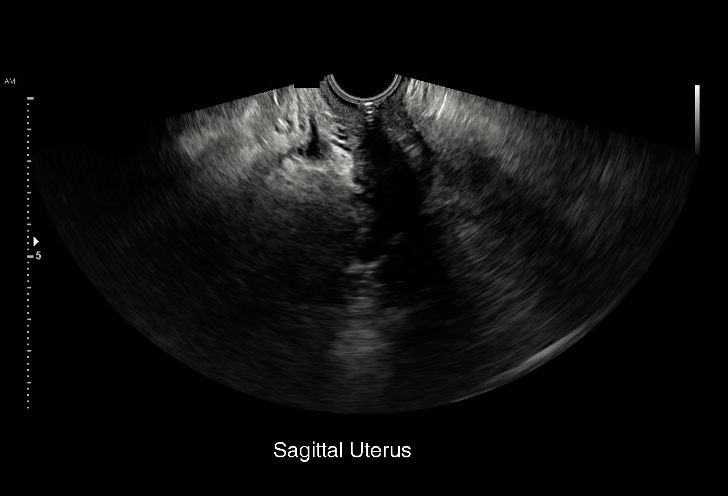
[im 6/25]
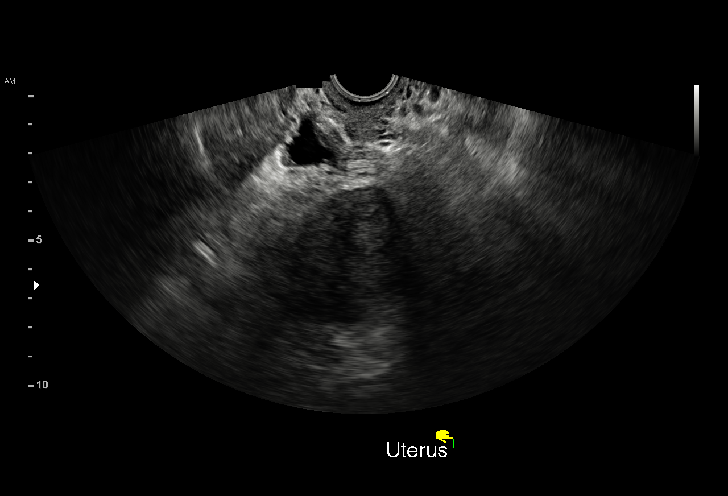
[im 8/25]
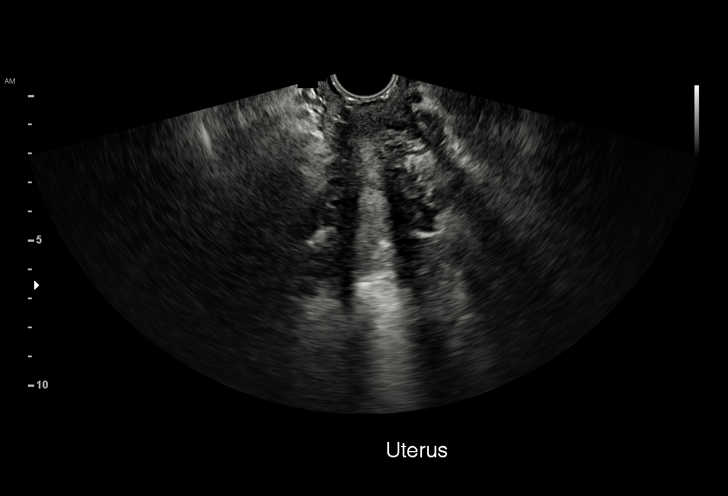
[im 10/25]
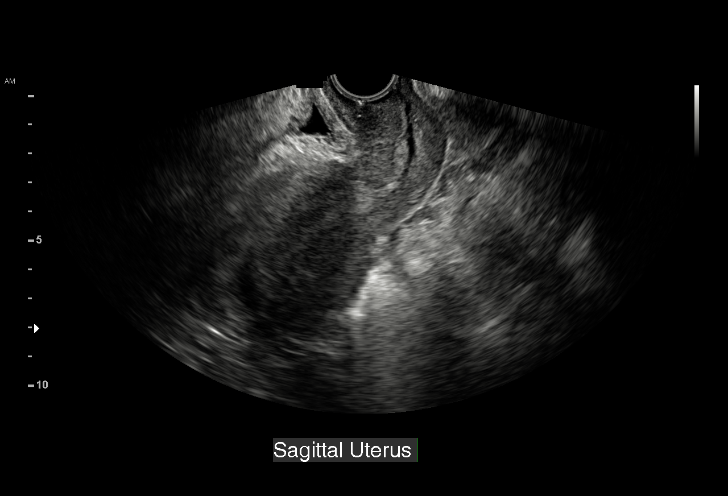
[im 11/25]
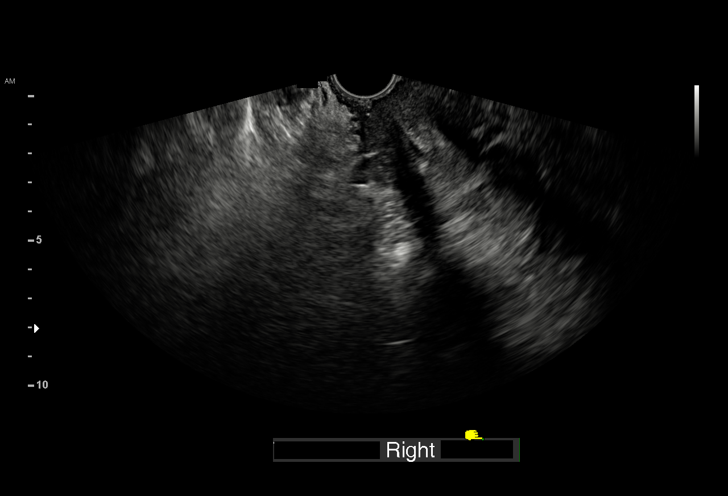
[im 13/25]
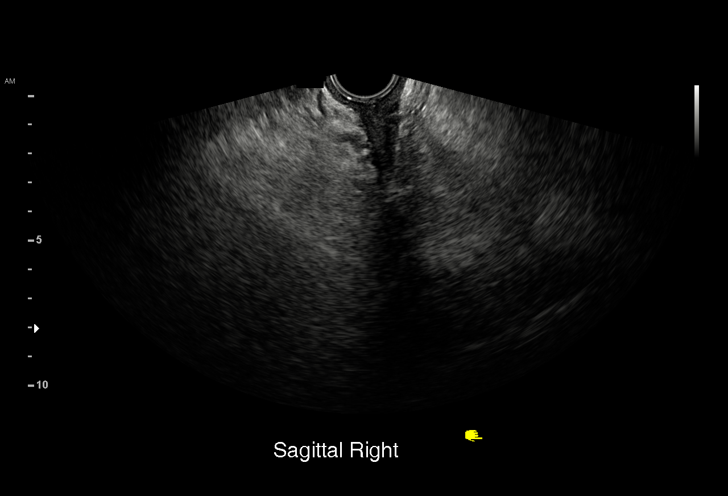
[im 15/25]
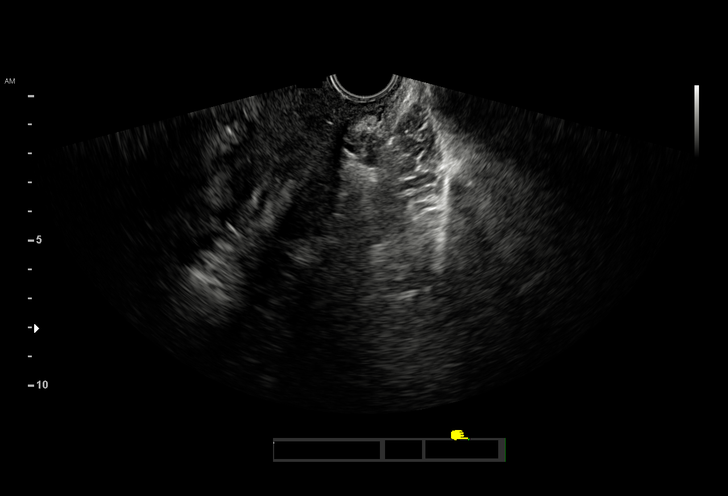
[im 16/25]
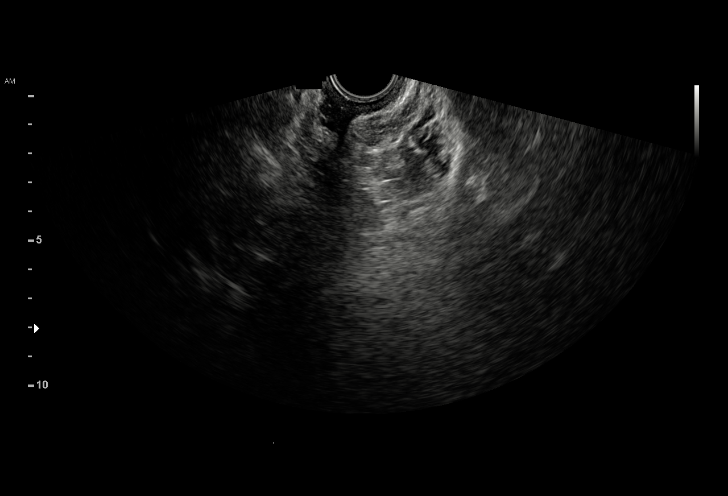
[im 18/25]
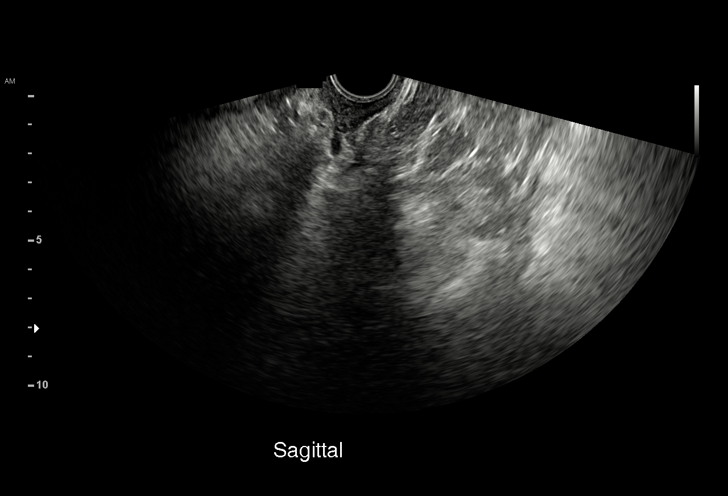
[im 20/25]
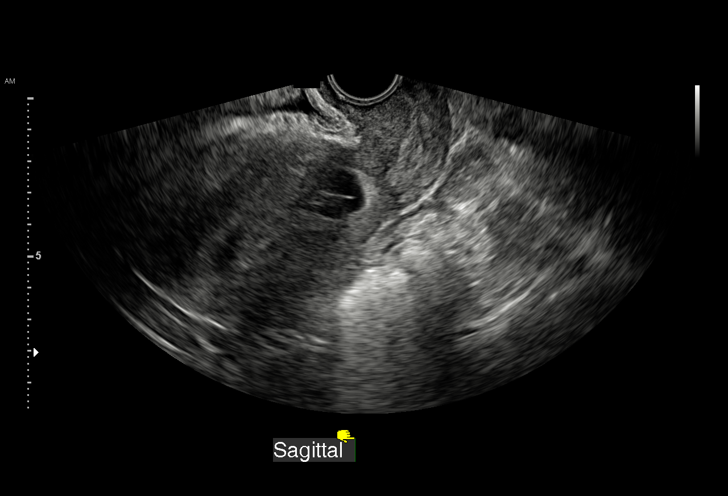
[im 21/25]
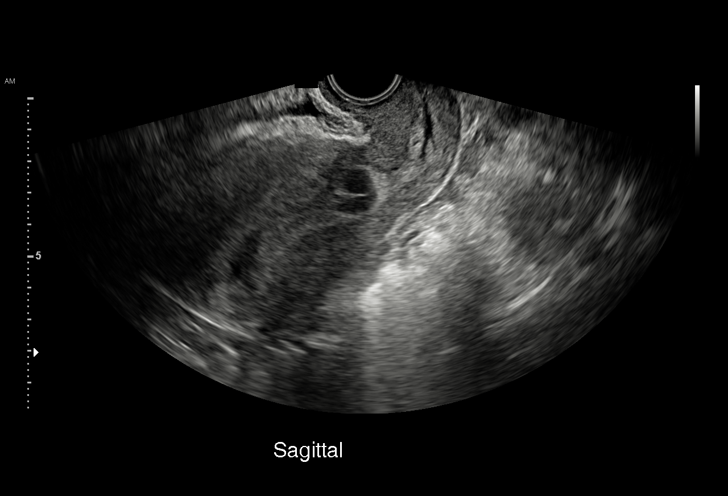
[im 23/25]
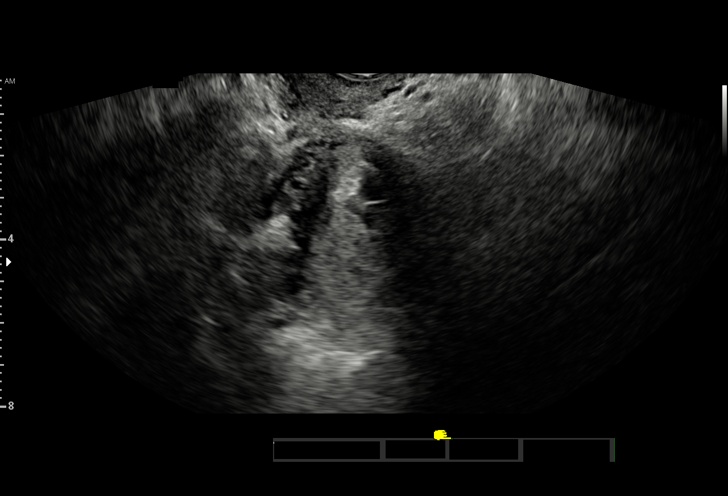
[im 25/25]
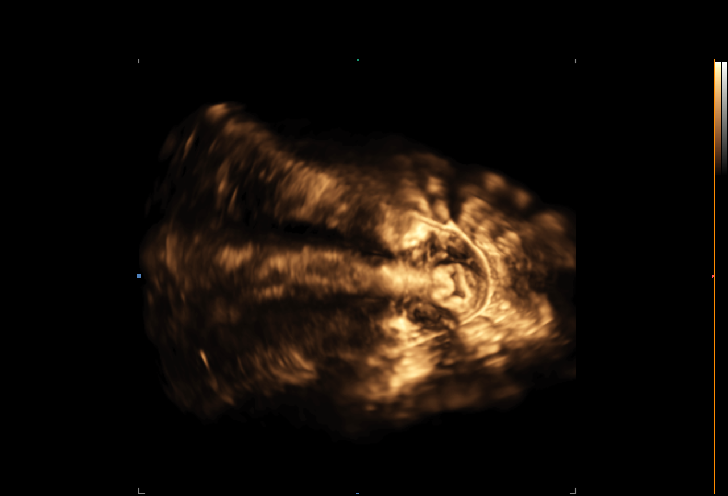

[15 of 25 positions shown; findings below may reference images not displayed]

FINDINGS: Intrauterine gestational sac: Possible ; there is a cystic structure
within the lower uterine segment endometrial canal. This could
represent the gestational sac image previously but now in the lower
uterine location. Images provided are inadequate to be confirmatory.
Study quality is degraded by patient tolerance for the exam.

Yolk sac:  Not seen

Embryo:  Not seen

Cardiac Activity: Absent

Subchorionic hemorrhage:  Fluid identified within the endometrium

Maternal uterus/adnexae: Ovaries are not visualized.
IMPRESSION: 1. Study today is quite limited by patient's tolerance for the exam.
2. There is a sac-like structure in the lower uterine endometrial
canal. There is a small amount of fluid in the upper uterine canal,
possibly indicating gestational sac in the upper canal. Detail is
too limited to resolve its location.
3. Consider transabdominal images and/or follow-up imaging when the
patient is able.

## 2015-12-31 SURGERY — DILATION AND CURETTAGE
Anesthesia: Epidural | Site: Vagina

## 2015-12-31 MED ORDER — LIDOCAINE HCL (PF) 1 % IJ SOLN
INTRAMUSCULAR | Status: DC | PRN
  Administered 2015-12-31: 20 mL

## 2015-12-31 MED ORDER — PROPOFOL 10 MG/ML IV BOLUS
INTRAVENOUS | Status: AC
  Filled 2015-12-31: qty 20

## 2015-12-31 MED ORDER — PROPOFOL 10 MG/ML IV BOLUS
INTRAVENOUS | Status: DC | PRN
  Administered 2015-12-31: 250 mg via INTRAVENOUS

## 2015-12-31 MED ORDER — MIDAZOLAM HCL 2 MG/2ML IJ SOLN
INTRAMUSCULAR | Status: AC
  Filled 2015-12-31: qty 2

## 2015-12-31 MED ORDER — LIDOCAINE HCL (CARDIAC) 20 MG/ML IV SOLN
INTRAVENOUS | Status: DC | PRN
  Administered 2015-12-31: 100 mg via INTRAVENOUS

## 2015-12-31 MED ORDER — NITROFURANTOIN MONOHYD MACRO 100 MG PO CAPS: ORAL_CAPSULE | ORAL | 0 refills | Status: DC

## 2015-12-31 MED ORDER — ACETAMINOPHEN 160 MG/5ML PO SOLN: 325.0000 mg | ORAL | Status: DC | PRN

## 2015-12-31 MED ORDER — FENTANYL CITRATE (PF) 100 MCG/2ML IJ SOLN
INTRAMUSCULAR | Status: AC
  Filled 2015-12-31: qty 2

## 2015-12-31 MED ORDER — GLYCOPYRROLATE 0.2 MG/ML IJ SOLN
INTRAMUSCULAR | Status: DC | PRN
  Administered 2015-12-31: 0.1 mg via INTRAVENOUS

## 2015-12-31 MED ORDER — LIDOCAINE HCL 2 % EX GEL
CUTANEOUS | Status: AC
  Filled 2015-12-31: qty 5

## 2015-12-31 MED ORDER — METOCLOPRAMIDE HCL 5 MG/ML IJ SOLN
INTRAMUSCULAR | Status: DC | PRN
  Administered 2015-12-31 (×2): 5 mg via INTRAVENOUS

## 2015-12-31 MED ORDER — FENTANYL CITRATE (PF) 100 MCG/2ML IJ SOLN
25.0000 ug | INTRAMUSCULAR | Status: DC | PRN
  Administered 2015-12-31: 50 ug via INTRAVENOUS

## 2015-12-31 MED ORDER — OXYCODONE HCL 5 MG/5ML PO SOLN: 5.0000 mg | Freq: Once | ORAL | Status: DC | PRN

## 2015-12-31 MED ORDER — ONDANSETRON HCL 4 MG/2ML IJ SOLN
INTRAMUSCULAR | Status: AC
  Filled 2015-12-31: qty 2

## 2015-12-31 MED ORDER — SODIUM CHLORIDE 0.9 % IV SOLN: INTRAVENOUS | Status: DC

## 2015-12-31 MED ORDER — ONDANSETRON HCL 4 MG/2ML IJ SOLN
INTRAMUSCULAR | Status: DC | PRN
  Administered 2015-12-31: 4 mg via INTRAVENOUS

## 2015-12-31 MED ORDER — ACETAMINOPHEN 325 MG PO TABS: 325.0000 mg | ORAL_TABLET | ORAL | Status: DC | PRN

## 2015-12-31 MED ORDER — LIDOCAINE HCL 1 % IJ SOLN
INTRAMUSCULAR | Status: AC
  Filled 2015-12-31: qty 20

## 2015-12-31 MED ORDER — FENTANYL CITRATE (PF) 100 MCG/2ML IJ SOLN
INTRAMUSCULAR | Status: AC
  Filled 2015-12-31: qty 4

## 2015-12-31 MED ORDER — MEPERIDINE HCL 25 MG/ML IJ SOLN: 6.2500 mg | INTRAMUSCULAR | Status: DC | PRN

## 2015-12-31 MED ORDER — KETOROLAC TROMETHAMINE 30 MG/ML IJ SOLN
INTRAMUSCULAR | Status: DC | PRN
  Administered 2015-12-31: 30 mg via INTRAVENOUS

## 2015-12-31 MED ORDER — MIDAZOLAM HCL 2 MG/2ML IJ SOLN
INTRAMUSCULAR | Status: DC | PRN
  Administered 2015-12-31 (×2): 1 mg via INTRAVENOUS

## 2015-12-31 MED ORDER — OXYCODONE HCL 5 MG PO TABS: 5.0000 mg | ORAL_TABLET | Freq: Once | ORAL | Status: DC | PRN

## 2015-12-31 MED ORDER — SCOPOLAMINE 1 MG/3DAYS TD PT72
1.0000 | MEDICATED_PATCH | Freq: Once | TRANSDERMAL | Status: DC
  Administered 2015-12-31: 1.5 mg via TRANSDERMAL

## 2015-12-31 MED ORDER — LACTATED RINGERS IV SOLN
INTRAVENOUS | Status: DC
  Administered 2015-12-31 (×2): via INTRAVENOUS

## 2015-12-31 MED ORDER — ONDANSETRON HCL 4 MG/2ML IJ SOLN: 4.0000 mg | Freq: Once | INTRAMUSCULAR | Status: DC | PRN

## 2015-12-31 MED ORDER — KETOROLAC TROMETHAMINE 30 MG/ML IJ SOLN: 30.0000 mg | Freq: Once | INTRAMUSCULAR | Status: DC

## 2015-12-31 MED ORDER — DOXYCYCLINE HYCLATE 100 MG IV SOLR
100.0000 mg | Freq: Once | INTRAVENOUS | Status: AC
  Administered 2015-12-31: 100 mg via INTRAVENOUS
  Filled 2015-12-31: qty 100

## 2015-12-31 MED ORDER — OXYCODONE-ACETAMINOPHEN 5-325 MG PO TABS: 1.0000 | ORAL_TABLET | Freq: Four times a day (QID) | ORAL | 0 refills | Status: DC | PRN

## 2015-12-31 MED ORDER — SCOPOLAMINE 1 MG/3DAYS TD PT72
MEDICATED_PATCH | TRANSDERMAL | Status: AC
  Administered 2015-12-31: 1.5 mg via TRANSDERMAL
  Filled 2015-12-31: qty 1

## 2015-12-31 MED ORDER — FENTANYL CITRATE (PF) 250 MCG/5ML IJ SOLN
INTRAMUSCULAR | Status: DC | PRN
  Administered 2015-12-31: 100 ug via INTRAVENOUS
  Administered 2015-12-31 (×2): 25 ug via INTRAVENOUS
  Administered 2015-12-31: 100 ug via INTRAVENOUS

## 2015-12-31 MED ORDER — LIDOCAINE HCL (CARDIAC) 20 MG/ML IV SOLN
INTRAVENOUS | Status: AC
  Filled 2015-12-31: qty 5

## 2015-12-31 SURGICAL SUPPLY — 18 items
CATH ROBINSON RED A/P 16FR (CATHETERS) IMPLANT
CLOTH BEACON ORANGE TIMEOUT ST (SAFETY) ×2 IMPLANT
CONTAINER PREFILL 10% NBF 60ML (FORM) ×4 IMPLANT
GLOVE BIO SURGEON STRL SZ7 (GLOVE) ×2 IMPLANT
GLOVE BIOGEL PI IND STRL 7.0 (GLOVE) ×1 IMPLANT
GLOVE BIOGEL PI IND STRL 7.5 (GLOVE) ×1 IMPLANT
GLOVE BIOGEL PI INDICATOR 7.0 (GLOVE) ×1
GLOVE BIOGEL PI INDICATOR 7.5 (GLOVE) ×1
GOWN STRL REUS W/ TWL LRG LVL3 (GOWN DISPOSABLE) ×1 IMPLANT
GOWN STRL REUS W/ TWL XL LVL3 (GOWN DISPOSABLE) ×1 IMPLANT
GOWN STRL REUS W/TWL LRG LVL3 (GOWN DISPOSABLE) ×5 IMPLANT
GOWN STRL REUS W/TWL XL LVL3 (GOWN DISPOSABLE) ×1
NS IRRIG 1000ML POUR BTL (IV SOLUTION) ×2 IMPLANT
PACK VAGINAL MINOR WOMEN LF (CUSTOM PROCEDURE TRAY) ×2 IMPLANT
PAD OB MATERNITY 4.3X12.25 (PERSONAL CARE ITEMS) ×2 IMPLANT
PAD PREP 24X48 CUFFED NSTRL (MISCELLANEOUS) ×2 IMPLANT
TOWEL OR 17X24 6PK STRL BLUE (TOWEL DISPOSABLE) ×4 IMPLANT
VACURETTE 7MM CVD STRL WRAP (CANNULA) ×2 IMPLANT

## 2015-12-31 NOTE — Anesthesia Preprocedure Evaluation (Addendum)
Anesthesia Evaluation  Patient identified by MRN, date of birth, ID band Patient awake    Reviewed: Allergy & Precautions, H&P , NPO status , Patient's Chart, lab work & pertinent test results  Airway Mallampati: III  TM Distance: >3 FB Neck ROM: full    Dental  (+) Teeth Intact   Pulmonary neg pulmonary ROS,    Pulmonary exam normal        Cardiovascular hypertension, Normal cardiovascular exam     Neuro/Psych negative neurological ROS  negative psych ROS   GI/Hepatic negative GI ROS, Neg liver ROS,   Endo/Other  diabetesMorbid obesity  Renal/GU negative Renal ROS     Musculoskeletal   Abdominal (+) + obese,   Peds  Hematology negative hematology ROS (+)   Anesthesia Other Findings   Reproductive/Obstetrics negative OB ROS                            Anesthesia Physical Anesthesia Plan  ASA: III  Anesthesia Plan: Epidural   Post-op Pain Management:    Induction: Intravenous  Airway Management Planned: LMA  Additional Equipment:   Intra-op Plan:   Post-operative Plan:   Informed Consent: I have reviewed the patients History and Physical, chart, labs and discussed the procedure including the risks, benefits and alternatives for the proposed anesthesia with the patient or authorized representative who has indicated his/her understanding and acceptance.   Dental Advisory Given  Plan Discussed with: CRNA and Surgeon  Anesthesia Plan Comments:         Anesthesia Quick Evaluation

## 2015-12-31 NOTE — Op Note (Signed)
Operative Note   12/31/2015  PRE-OP DIAGNOSIS: ?Incomplete abortion. Gestational sac 18mm consistent with 6 weeks. BMI 53   POST-OP DIAGNOSIS: Likely completed abortion  SURGEON: Surgeon(s) and Role:    * Aletha Halim, MD - Primary  ASSISTANT: None  PROCEDURE:  Suction dilation and curettage  ANESTHESIA: Monitor Anesthesia Care and paracervical block  ESTIMATED BLOOD LOSS: 54mL  DRAINS: no I/O cath done  TOTAL IV FLUIDS: per anesthesia notes  SPECIMENS: products of conception to pathology  VTE PROPHYLAXIS: SCDs to the bilateral lower extremities  ANTIBIOTICS: Doxycycline 100mg  IV x 1 pre op  COMPLICATIONS: none  DISPOSITION: PACU - hemodynamically stable.  CONDITION: stable  BLOOD TYPE: O positive. Rhogam given:not applicable  FINDINGS: Exam under anesthesia limited by body habitus but no discrete masses felt. Gritty texture at the end of the case but no discrete sac or tissue noted on curettage specimens or suction specimen.   PROCEDURE IN DETAIL:  After informed consent was obtained, the patient was taken to the operating room where anesthesia was obtained without difficulty. The patient was positioned in the dorsal lithotomy position in [place]. The patient was examined under anesthesia, with the above noted findings.  The bi-valved speculum was placed inside the patient's vagina, and the the anterior lip of the cervix was seen and grasped with the tenaculum.  A paracervical block was achieved with 57mL of 1% lidocaine and then the cervix was progressively dilated to a 19 French-Pratt dilator.  The suction was then calibrated to 27mmHg and connected to the number 7 cannula, which was then introduced with the above noted findings. A gentle curettage was done, with no discrete products of conception seen.  The uterus was then sounded to 10cm x 2.  Next, the suction cannula was then introduced again into the uterine cavity to the fundus, the cannula at the external os  clamped with ringed forceps and brought back out; this was measured and went to 10cm.  Suction curettage done three more times yielding minimal results and four quadrant curettage done again with gritty texture noted throughout; suction was then done one more time.   Excellent hemostasis was noted, and all instruments were removed, with excellent hemostasis noted throughout.  She was then taken out of dorsal lithotomy. The patient tolerated the procedure well.  Sponge, lap and instrument counts were correct x2.  The patient was taken to recovery room in excellent condition.  Discussed with partner that patient likely had already passed POCs prior to procedure given above noted findings. Will follow up final pathology.   Durene Romans MD Attending Center for [employer] farm [employer])

## 2015-12-31 NOTE — Progress Notes (Addendum)
Pre Op H&P  12/31/2015  Time: 1343  Clinic: [facility]  Chief Complaint: Pre op   History of Present Illness: [name] is a 25 y.o. [id number],  with the above CC. Preg complicated by has Supervision of high-risk pregnancy; BMI 50.0-59.9, adult ([place]); Obesity in pregnancy; Chronic hypertension complicating or reason for care during pregnancy, first trimester; Diabetes mellitus type 2 affecting pregnancy in first trimester; Threatened abortion; and History of herpes simplex infection (serology only) on her problem list.   She was seen on 10/30 for her NOB and was diagnosed with an incomplete AB via formal transvag u/s with MSD of 14mm c/w [redacted]w[redacted]d with a yolk sac seen.  Options d/w pt after dx, including exp management vs medical management vs surgical management and she elected for surgical management.   Since she was last seen in clinic she's had some spotting but no heavy bleeding but she states she did pass a large clot when just went to the bathroom. No pain, fevers, chills, nausea, vomiting.   She is O pos.  ROS: as per HPI  OBGYN History: As per HPI. OB History  Gravida Para Term Preterm AB Living  5 3 3   1 3   SAB TAB Ectopic Multiple Live Births  1       3    # Outcome Date GA Lbr Len/2nd Weight Sex Delivery Anes PTL Lv  5 Current           4 Term 02/12/14 [redacted]w[redacted]d  6 lb 12 oz (3.062 kg) F Vag-Spont EPI N LIV  3 SAB 2015          2 Term 08/06/08 [redacted]w[redacted]d  6 lb 5 oz (2.863 kg) F Vag-Spont EPI N LIV  1 Term 04/02/07 [redacted]w[redacted]d  5 lb 5 oz (2.41 kg) F Vag-Spont EPI Y LIV     Complications: Preeclampsia,Diabetes mellitus complicating pregnancy    Obstetric Comments  H/o GDM and HTN in her last pregnancy    Any issues with any prior pregnancies: no Any prior children are healthy, doing well, without any problems or issues: yes History of pap smears: Yes. Last pap smear 2015 (negative). H/o CIN in 2012 History of STIs: Yes   Past Medical History: Past Medical  History:  Diagnosis Date  . Benign essential HTN   . Diabetes mellitus without complication ([place])    TYPE 2 LAST DOSE FRIDAY OCTOBER 27 (METFORMIN)  . History of herpes simplex infection   . Hyperlipidemia   . Vaginal delivery 2009, 2010, 2015    Past Surgical History: Past Surgical History:  Procedure Laterality Date  . CHOLECYSTECTOMY    . TONSILLECTOMY      Family History:  Family History  Problem Relation Age of Onset  . Hypertension Mother   . Cancer Maternal Grandmother     stomach cancer  . Depression Maternal Grandfather   . Cancer Maternal Grandfather     leukemia  . Depression Paternal Grandmother    She denies any female cancers, bleeding or blood clotting disorders.  She denies any history of mental retardation, birth defects or genetic disorders in her or the FOB's history  Social History:  Social History   Social History  . Marital status: Single    Spouse name: N/A  . Number of children: N/A  . Years of education: N/A   Occupational History  . Not on file.   Social History Main Topics  . Smoking status: Never Smoker  . Smokeless tobacco:  Never Used  . Alcohol use Yes     Comment: OCCASIONAL  . Drug use: No  . Sexual activity: Yes    Birth control/ protection: None     Comment: Nexplanon removed early 2017   Other Topics Concern  . Not on file   Social History Narrative  . No narrative on file  CNA with [employer]  Allergy: No Known Allergies  Health Maintenance:  Mammogram Up to Date: not applicable  Current Outpatient Medications: PNV  Physical Exam:   BP (!) 157/90   Pulse 89   Temp 98.4 F (36.9 C) (Oral)   Resp 18   Ht 5\' 3"  (1.6 m)   Wt 297 lb (134.7 kg)   LMP 10/10/2015 (Exact Date)   SpO2 100%   BMI 52.61 kg/m   Body mass index is 52.61 kg/m.   PE from 10/30 clinic visit General appearance: Well nourished, well developed female in no acute distress.  Neck:  Supple, normal appearance, and no thyromegaly   Cardiovascular: S1, S2 normal, no murmur, rub or gallop, regular rate and rhythm Respiratory:  Clear to auscultation bilateral. Normal respiratory effort Abdomen: positive bowel sounds and no masses, hernias; diffusely non tender to palpation, non distended Breasts: breasts appear normal, no suspicious masses, no skin or nipple changes or axillary nodes, and normal inspection. Neuro/Psych:  Normal mood and affect.  Skin:  Warm and dry.  Lymphatic:  No inguinal lymphadenopathy.   Pelvic exam: is limited by body habitus EGBUS: within normal limits, Vagina: within normal limits and with scant amount of old pink mixed discharge  in the vault, Cervix: normal appearing cervix without discharge or lesions, closed/long/high, but no active bleeding. Cervix anterior and nttp Uterus:  Not palpable, and Adnexa:  normal adnexa and no mass, fullness, tenderness  Laboratory: CBC Latest Ref Rng & Units 12/31/2015 11/27/2015  WBC 4.0 - 10.5 K/uL 12.0(H) 13.0(H)  Hemoglobin 12.0 - 15.0 g/dL 13.1 13.5  Hematocrit 36.0 - 46.0 % 38.6 41.7  Platelets 150 - 400 K/uL 239 257   Imaging:  As above.  Bedside u/s: transabdominal. Bladder empty. U/s mid plane. Difficult to see uterine cavity but GS seen. U/s machine doesn't have doppler flow capability  Assessment: possible completed AB. Pat stable  Plan: ?incomplete AB Will get transvag u/s and if +GS seen will proceed with suction D&C. Pt consented to this and possible uterine perforation, etc risks d/w her.  If negative, will come by and d/w pt that likely has passed on it's own and d/w her how to proceed from there.  UTI Patient hasn't filled keflex Rx yet. Sx are back and pt told not to pick and to pick up macrobid that i'll send in  Primary care Patient will need referral for PCP after procedure.     Durene Romans MD Attending Center for [place] (Faculty Practice)   **ADDENDUM** Time: 1432 U/s images reviewed and appears to be GS  still in the uterus. Will proceed with suction D&C when all parties are ready.  Durene Romans MD Attending Center for [employer] farm [employer])

## 2015-12-31 NOTE — H&P (Signed)
See progress note from today

## 2015-12-31 NOTE — Anesthesia Procedure Notes (Signed)
Procedure Name: LMA Insertion Date/Time: 12/31/2015 2:54 PM Performed by: Flossie Dibble Pre-anesthesia Checklist: Patient identified, Emergency Drugs available, Suction available and Patient being monitored Patient Re-evaluated:Patient Re-evaluated prior to inductionPreoxygenation: Pre-oxygenation with 100% oxygen Intubation Type: IV induction LMA: LMA inserted LMA Size: 4.0 Number of attempts: 1 Placement Confirmation: positive ETCO2 and breath sounds checked- equal and bilateral Tube secured with: Tape Dental Injury: Teeth and Oropharynx as per pre-operative assessment

## 2015-12-31 NOTE — Discharge Instructions (Addendum)
We will discuss your surgery once again in detail at your post-op visit in two to four weeks. If you havent already done so, please call to make your appointment as soon as possible.  Dilation and Curettage or Vacuum Curettage, Care After These instructions give you information on caring for yourself after your procedure. Your doctor may also give you more specific instructions. Call your doctor if you have any problems or questions after your procedure. HOME CARE  Do not drive for 24 hours.  Wait 1 week before doing any activities that wear you out.  Do not stand for a long time.  Limit stair climbing to once or twice a day.  Rest often.  Continue with your usual diet.  Drink enough fluids to keep your pee (urine) clear or pale yellow.  If you have a hard time pooping (constipation), you may:  Take a medicine to help you go poop (laxative) as told by your doctor.  Eat more fruit and bran.  Drink more fluids.  Take showers, not baths, for as long as told by your doctor.  Do not swim or use a hot tub until your doctor says it is okay.  Have someone with you for 1day after the procedure.  Do not douche, use tampons, or have sex (intercourse) until seen by your doctor  Only take medicines as told by your doctor. Do not take aspirin. It can cause bleeding.  Keep all doctor visits. GET HELP IF:  You have cramps or pain not helped by medicine.  You have new pain in the belly (abdomen).  You have a bad smelling fluid coming from your vagina.  You have a rash.  You have problems with any medicine. GET HELP RIGHT AWAY IF:   You start to bleed more than a regular period.  You have a fever.  You have chest pain.  You have trouble breathing.  You feel dizzy or feel like passing out (fainting).  You pass out.  You have pain in the tops of your shoulders.  You have vaginal bleeding with or without clumps of blood (blood clots). MAKE SURE YOU:  Understand  these instructions.  Will watch your condition.  Will get help right away if you are not doing well or get worse. Document Released: 11/24/2007 Document Revised: 02/19/2013 Document Reviewed: 09/13/2012 [facility] Patient Information 2015 [place], [place]. This information is not intended to replace advice given to you by your health care provider. Make sure you discuss any questions you have with your health care provider. DISCHARGE INSTRUCTIONS: D&C / D&E The following instructions have been prepared to help you care for yourself upon your return home.   Personal hygiene:  Use sanitary pads for vaginal drainage, not tampons.  Shower the day after your procedure.  NO tub baths, pools or Jacuzzis for 2-3 weeks.  Wipe front to back after using the bathroom.  Activity and limitations:  Do NOT drive or operate any equipment for 24 hours. The effects of anesthesia are still present and drowsiness may result.  Do NOT rest in bed all day.  Walking is encouraged.  Walk up and down stairs slowly.  You may resume your normal activity in one to two days or as indicated by your physician.  Sexual activity: NO intercourse for at least 2 weeks after the procedure, or as indicated by your physician.  Diet: Eat a light meal as desired this evening. You may resume your usual diet tomorrow.  Return to work: You  may resume your work activities in one to two days or as indicated by your doctor.  What to expect after your surgery: Expect to have vaginal bleeding/discharge for 2-3 days and spotting for up to 10 days. It is not unusual to have soreness for up to 1-2 weeks. You may have a slight burning sensation when you urinate for the first day. Mild cramps may continue for a couple of days. You may have a regular period in 2-6 weeks.  Call your doctor for any of the following:  Excessive vaginal bleeding, saturating and changing one pad every hour.  Inability to urinate 6 hours after discharge  from hospital.  Pain not relieved by pain medication.  Fever of 100.4 F or greater.  Unusual vaginal discharge or odor.   Call for an appointment:    Patients signature: ______________________  Nurses signature ________________________  Support person's signature_______________________  Post Anesthesia Home Care Instructions  NO IBUPROFEN PRODUCTS UNTIL: 9:30 PM TONIGHT  Activity: Get plenty of rest for the remainder of the day. A responsible adult should stay with you for 24 hours following the procedure.  For the next 24 hours, DO NOT: -Drive a car -Paediatric nurse -Drink alcoholic beverages -Take any medication unless instructed by your physician -Make any legal decisions or sign important papers.  Meals: Start with liquid foods such as gelatin or soup. Progress to regular foods as tolerated. Avoid greasy, spicy, heavy foods. If nausea and/or vomiting occur, drink only clear liquids until the nausea and/or vomiting subsides. Call your physician if vomiting continues.  Special Instructions/Symptoms: Your throat may feel dry or sore from the anesthesia or the breathing tube placed in your throat during surgery. If this causes discomfort, gargle with warm salt water. The discomfort should disappear within 24 hours.  If you had a scopolamine patch placed behind your ear for the management of post- operative nausea and/or vomiting:  1. The medication in the patch is effective for 72 hours, after which it should be removed.  Wrap patch in a tissue and discard in the trash. Wash hands thoroughly with soap and water. 2. You may remove the patch earlier than 72 hours if you experience unpleasant side effects which may include dry mouth, dizziness or visual disturbances. 3. Avoid touching the patch. Wash your hands with soap and water after contact with the patch.

## 2015-12-31 NOTE — Anesthesia Postprocedure Evaluation (Signed)
Anesthesia Post Note  Patient: Michelle Lamb  Procedure(s) Performed: Procedure(s) (LRB): SUCTION DILATATION AND CURETTAGE (N/A)  Patient location during evaluation: PACU Anesthesia Type: General Level of consciousness: awake Pain management: pain level controlled Vital Signs Assessment: post-procedure vital signs reviewed and stable Respiratory status: respiratory function stable Cardiovascular status: stable Postop Assessment: patient able to bend at knees Anesthetic complications: no     Last Vitals:  Vitals:   12/31/15 1600 12/31/15 1615  BP: 137/66 138/63  Pulse: 77 73  Resp: 16 20  Temp:      Last Pain:  Vitals:   12/31/15 1630  TempSrc:   PainSc: (P) 0-No pain   Pain Goal: Patients Stated Pain Goal: (P) 4 (12/31/15 1630)               [name]

## 2015-12-31 NOTE — Transfer of Care (Signed)
Immediate Anesthesia Transfer of Care Note  Patient: Michelle Lamb  Procedure(s) Performed: Procedure(s): SUCTION DILATATION AND CURETTAGE (N/A)  Patient Location: PACU  Anesthesia Type:General  Level of Consciousness: awake, alert  and oriented  Airway & Oxygen Therapy: Patient Spontanous Breathing and Patient connected to nasal cannula oxygen  Post-op Assessment: Report given to RN, Post -op Vital signs reviewed and stable and Patient moving all extremities  Post vital signs: Reviewed and stable  Last Vitals:  Vitals:   12/31/15 1303  BP: (!) 157/90  Pulse: 89  Resp: 18  Temp: 36.9 C    Last Pain:  Vitals:   12/31/15 1303  TempSrc: Oral  PainSc: 5       Patients Stated Pain Goal: 4 ([id number])  Complications: No apparent anesthesia complications

## 2016-01-01 ENCOUNTER — Inpatient Hospital Stay (HOSPITAL_COMMUNITY): Payer: 59 | Admitting: Anesthesiology

## 2016-01-01 ENCOUNTER — Encounter (HOSPITAL_COMMUNITY)
Admission: AD | Disposition: A | Discharge status: Home / Self Care | Payer: Self-pay | Source: Ambulatory Visit | Attending: Obstetrics and Gynecology

## 2016-01-01 ENCOUNTER — Encounter (HOSPITAL_COMMUNITY): Payer: Self-pay | Admitting: Obstetrics and Gynecology

## 2016-01-01 ENCOUNTER — Inpatient Hospital Stay (HOSPITAL_COMMUNITY): Payer: 59

## 2016-01-01 ENCOUNTER — Inpatient Hospital Stay (HOSPITAL_COMMUNITY)
Admission: AD | Admit: 2016-01-01 | Discharge: 2016-01-02 | Disposition: A | Discharge status: Home / Self Care | Payer: 59 | Source: Ambulatory Visit | Attending: Obstetrics and Gynecology | Admitting: Obstetrics and Gynecology

## 2016-01-01 DIAGNOSIS — O021 Missed abortion: Secondary | ICD-10-CM | POA: Diagnosis not present

## 2016-01-01 DIAGNOSIS — Z6841 Body Mass Index (BMI) 40.0 and over, adult: Secondary | ICD-10-CM | POA: Diagnosis not present

## 2016-01-01 DIAGNOSIS — O209 Hemorrhage in early pregnancy, unspecified: Secondary | ICD-10-CM

## 2016-01-01 HISTORY — PX: DILATION AND EVACUATION: SHX1459

## 2016-01-01 LAB — CBC
HCT: 29.4 % — ABNORMAL LOW (ref 36.0–46.0)
HEMATOCRIT: 33.7 % — AB (ref 36.0–46.0)
HEMOGLOBIN: 11.3 g/dL — AB (ref 12.0–15.0)
HEMOGLOBIN: 9.7 g/dL — AB (ref 12.0–15.0)
MCH: 27.6 pg (ref 26.0–34.0)
MCH: 27.4 pg (ref 26.0–34.0)
MCHC: 33.5 g/dL (ref 30.0–36.0)
MCHC: 33 g/dL (ref 30.0–36.0)
MCV: 82.4 fL (ref 78.0–100.0)
MCV: 83.1 fL (ref 78.0–100.0)
PLATELETS: 279 10*3/uL (ref 150–400)
Platelets: 245 10*3/uL (ref 150–400)
RBC: 4.09 MIL/uL (ref 3.87–5.11)
RBC: 3.54 MIL/uL — ABNORMAL LOW (ref 3.87–5.11)
RDW: 13.7 % (ref 11.5–15.5)
RDW: 13.8 % (ref 11.5–15.5)
WBC: 13.1 10*3/uL — AB (ref 4.0–10.5)
WBC: 12.1 10*3/uL — ABNORMAL HIGH (ref 4.0–10.5)

## 2016-01-01 LAB — GLUCOSE, CAPILLARY: Glucose-Capillary: 219 mg/dL — ABNORMAL HIGH (ref 65–99)

## 2016-01-01 LAB — PREPARE RBC (CROSSMATCH)

## 2016-01-01 SURGERY — DILATION AND EVACUATION, UTERUS
Anesthesia: General | Site: Vagina

## 2016-01-01 MED ORDER — SUCCINYLCHOLINE CHLORIDE 20 MG/ML IJ SOLN
INTRAMUSCULAR | Status: DC | PRN
  Administered 2016-01-01: 140 mg via INTRAVENOUS

## 2016-01-01 MED ORDER — PROPOFOL 10 MG/ML IV BOLUS
INTRAVENOUS | Status: AC
  Filled 2016-01-01: qty 40

## 2016-01-01 MED ORDER — PROPOFOL 10 MG/ML IV BOLUS
INTRAVENOUS | Status: DC | PRN
  Administered 2016-01-01: 200 mg via INTRAVENOUS

## 2016-01-01 MED ORDER — KETOROLAC TROMETHAMINE 30 MG/ML IJ SOLN
30.0000 mg | Freq: Once | INTRAMUSCULAR | Status: AC
  Administered 2016-01-01: 30 mg via INTRAVENOUS
  Filled 2016-01-01: qty 1

## 2016-01-01 MED ORDER — PROMETHAZINE HCL 25 MG/ML IJ SOLN: 6.2500 mg | INTRAMUSCULAR | Status: DC | PRN

## 2016-01-01 MED ORDER — LACTATED RINGERS IV SOLN
INTRAVENOUS | Status: DC | PRN
  Administered 2016-01-01: 22:00:00 via INTRAVENOUS

## 2016-01-01 MED ORDER — LIDOCAINE HCL (CARDIAC) 20 MG/ML IV SOLN
INTRAVENOUS | Status: DC | PRN
  Administered 2016-01-01: 80 mg via INTRAVENOUS

## 2016-01-01 MED ORDER — FENTANYL CITRATE (PF) 100 MCG/2ML IJ SOLN
INTRAMUSCULAR | Status: AC
  Filled 2016-01-01: qty 2

## 2016-01-01 MED ORDER — OXYTOCIN 40 UNITS IN LACTATED RINGERS INFUSION - SIMPLE MED
40.0000 [IU] | Freq: Once | INTRAVENOUS | Status: AC
  Administered 2016-01-01: 40 [IU] via INTRAVENOUS

## 2016-01-01 MED ORDER — LIDOCAINE HCL 1 % IJ SOLN
INTRAMUSCULAR | Status: DC | PRN
  Administered 2016-01-01: 20 mL

## 2016-01-01 MED ORDER — ONDANSETRON HCL 4 MG/2ML IJ SOLN
INTRAMUSCULAR | Status: DC | PRN
  Administered 2016-01-01: 4 mg via INTRAVENOUS

## 2016-01-01 MED ORDER — FENTANYL CITRATE (PF) 100 MCG/2ML IJ SOLN: 25.0000 ug | INTRAMUSCULAR | Status: DC | PRN

## 2016-01-01 MED ORDER — MISOPROSTOL 200 MCG PO TABS
800.0000 ug | ORAL_TABLET | Freq: Once | ORAL | Status: DC
  Filled 2016-01-01: qty 4

## 2016-01-01 MED ORDER — OXYTOCIN 40 UNITS IN LACTATED RINGERS INFUSION - SIMPLE MED
INTRAVENOUS | Status: DC | PRN
  Administered 2016-01-01: 100 mL via INTRAVENOUS

## 2016-01-01 MED ORDER — MIDAZOLAM HCL 2 MG/2ML IJ SOLN
INTRAMUSCULAR | Status: AC
  Filled 2016-01-01: qty 2

## 2016-01-01 MED ORDER — MIDAZOLAM HCL 2 MG/2ML IJ SOLN
INTRAMUSCULAR | Status: DC | PRN
  Administered 2016-01-01: 2 mg via INTRAVENOUS

## 2016-01-01 MED ORDER — SODIUM CHLORIDE 0.9 % IV SOLN: Freq: Once | INTRAVENOUS | Status: DC

## 2016-01-01 MED ORDER — LIDOCAINE HCL 1 % IJ SOLN
INTRAMUSCULAR | Status: AC
  Filled 2016-01-01: qty 20

## 2016-01-01 MED ORDER — MISOPROSTOL 200 MCG PO TABS: 200.0000 ug | ORAL_TABLET | Freq: Two times a day (BID) | ORAL | 0 refills | Status: DC

## 2016-01-01 MED ORDER — OXYTOCIN 40 UNITS IN LACTATED RINGERS INFUSION - SIMPLE MED
INTRAVENOUS | Status: AC
  Administered 2016-01-01: 40 [IU] via INTRAVENOUS
  Filled 2016-01-01: qty 1000

## 2016-01-01 MED ORDER — SOD CITRATE-CITRIC ACID 500-334 MG/5ML PO SOLN
30.0000 mL | Freq: Once | ORAL | Status: AC
  Administered 2016-01-01: 30 mL via ORAL
  Filled 2016-01-01: qty 15

## 2016-01-01 MED ORDER — FENTANYL CITRATE (PF) 100 MCG/2ML IJ SOLN
INTRAMUSCULAR | Status: DC | PRN
  Administered 2016-01-01: 100 ug via INTRAVENOUS

## 2016-01-01 SURGICAL SUPPLY — 20 items
CATH ROBINSON RED A/P 16FR (CATHETERS) ×3 IMPLANT
CLOTH BEACON ORANGE TIMEOUT ST (SAFETY) ×3 IMPLANT
DECANTER SPIKE VIAL GLASS SM (MISCELLANEOUS) ×3 IMPLANT
GLOVE BIOGEL PI IND STRL 7.0 (GLOVE) ×1 IMPLANT
GLOVE BIOGEL PI INDICATOR 7.0 (GLOVE) ×2
GLOVE ECLIPSE 9.0 STRL (GLOVE) ×6 IMPLANT
GOWN STRL REUS W/TWL 2XL LVL3 (GOWN DISPOSABLE) ×3 IMPLANT
GOWN STRL REUS W/TWL LRG LVL3 (GOWN DISPOSABLE) ×3 IMPLANT
KIT BERKELEY 1ST TRIMESTER 3/8 (MISCELLANEOUS) ×3 IMPLANT
NS IRRIG 1000ML POUR BTL (IV SOLUTION) ×3 IMPLANT
PACK VAGINAL MINOR WOMEN LF (CUSTOM PROCEDURE TRAY) ×3 IMPLANT
PAD OB MATERNITY 4.3X12.25 (PERSONAL CARE ITEMS) ×3 IMPLANT
PAD PREP 24X48 CUFFED NSTRL (MISCELLANEOUS) ×3 IMPLANT
SET BERKELEY SUCTION TUBING (SUCTIONS) ×3 IMPLANT
TOWEL OR 17X24 6PK STRL BLUE (TOWEL DISPOSABLE) ×6 IMPLANT
VACURETTE 10 RIGID CVD (CANNULA) ×3 IMPLANT
VACURETTE 12 RIGID CVD (CANNULA) IMPLANT
VACURETTE 7MM CVD STRL WRAP (CANNULA) IMPLANT
VACURETTE 8 RIGID CVD (CANNULA) IMPLANT
VACURETTE 9 RIGID CVD (CANNULA) IMPLANT

## 2016-01-01 NOTE — Transfer of Care (Signed)
Immediate Anesthesia Transfer of Care Note  Patient: Michelle Lamb  Procedure(s) Performed: Procedure(s): DILATATION AND EVACUATION (N/A)  Patient Location: PACU  Anesthesia Type:General  Level of Consciousness: awake, alert  and oriented  Airway & Oxygen Therapy: Patient Spontanous Breathing and Patient connected to nasal cannula oxygen  Post-op Assessment: Report given to RN and Post -op Vital signs reviewed and stable  Post vital signs: Reviewed and stable  Last Vitals:  Vitals:   01/01/16 2124 01/01/16 2135  BP: 124/59 (!) 138/115  Pulse: 91 92  Resp:    Temp:      Last Pain:  Vitals:   01/01/16 2016  TempSrc: Oral         Complications: No apparent anesthesia complications

## 2016-01-01 NOTE — Discharge Instructions (Signed)
Incomplete Miscarriage A miscarriage is the sudden loss of an unborn baby (fetus) before the 20th week of pregnancy. In an incomplete miscarriage, parts of the fetus or placenta (afterbirth) remain in the body.  Having a miscarriage can be an emotional experience. Talk with your health care provider about any questions you may have about miscarrying, the grieving process, and your future pregnancy plans. CAUSES   Problems with the fetal chromosomes that make it impossible for the baby to develop normally. Problems with the baby's genes or chromosomes are most often the result of errors that occur by chance as the embryo divides and grows. The problems are not inherited from the parents.  Infection of the cervix or uterus.  Hormone problems.  Problems with the cervix, such as having an incompetent cervix. This is when the tissue in the cervix is not strong enough to hold the pregnancy.  Problems with the uterus, such as an abnormally shaped uterus, uterine fibroids, or congenital abnormalities.  Certain medical conditions.  Smoking, drinking alcohol, or taking illegal drugs.  Trauma. SYMPTOMS   Vaginal bleeding or spotting, with or without cramps or pain.  Pain or cramping in the abdomen or lower back.  Passing fluid, tissue, or blood clots from the vagina. DIAGNOSIS  Your health care provider will perform a physical exam. You may also have an ultrasound to confirm the miscarriage. Blood or urine tests may also be ordered. TREATMENT   Usually, a dilation and curettage (D&C) procedure is performed. During a D&C procedure, the cervix is widened (dilated) and any remaining fetal or placental tissue is gently removed from the uterus.  Antibiotic medicines are prescribed if there is an infection. Other medicines may be given to reduce the size of the uterus (contract) if there is a lot of bleeding.  If you have Rh negative blood and your baby was Rh positive, you will need a Rho (D)  immune globulin shot. This shot will protect any future baby from having Rh blood problems in future pregnancies.  You may be confined to bed rest. This means you should stay in bed and only get up to use the bathroom. HOME CARE INSTRUCTIONS   Rest as directed by your health care provider.  Restrict activity as directed by your health care provider. You may be allowed to continue light activity if curettage was not done but you require further treatment.  Keep track of the number of pads you use each day. Keep track of how soaked (saturated) they are. Record this information.  Do not  use tampons.  Do not douche or have sexual intercourse until approved by your health care provider.  Keep all follow-up appointments for reevaluation and continuing management.  Only take over-the-counter or prescription medicines for pain, fever, or discomfort as directed by your health care provider.  Take antibiotic medicine as directed by your health care provider. Make sure you finish it even if you start to feel better. SEEK IMMEDIATE MEDICAL CARE IF:   You experience severe cramps in your stomach, back, or abdomen.  You have an unexplained temperature (make sure to record these temperatures).  You pass large clots or tissue (save these for your health care provider to inspect).  Your bleeding increases.  You become light-headed, weak, or have fainting episodes. MAKE SURE YOU:   Understand these instructions.  Will watch your condition.  Will get help right away if you are not doing well or get worse.   This information is not intended to   replace advice given to you by your health care provider. Make sure you discuss any questions you have with your health care provider.   Document Released: 02/14/2005 Document Revised: 03/07/2014 Document Reviewed: 09/13/2012 Elsevier Interactive Patient Education 2016 [employer]  

## 2016-01-01 NOTE — Anesthesia Procedure Notes (Signed)
Procedure Name: Intubation Date/Time: 01/01/2016 10:24 PM Performed by: Elenore Paddy Pre-anesthesia Checklist: Patient identified, Emergency Drugs available, Suction available, Patient being monitored and Timeout performed Patient Re-evaluated:Patient Re-evaluated prior to inductionOxygen Delivery Method: Circle system utilized Preoxygenation: Pre-oxygenation with 100% oxygen Intubation Type: IV induction, Rapid sequence and Cricoid Pressure applied Laryngoscope Size: Mac and 3 Grade View: Grade I Tube type: Oral Tube size: 7.0 mm Number of attempts: 1 Airway Equipment and Method: Stylet Placement Confirmation: ETT inserted through vocal cords under direct vision,  positive ETCO2 and breath sounds checked- equal and bilateral Secured at: 22 cm Tube secured with: Tape Dental Injury: Teeth and Oropharynx as per pre-operative assessment

## 2016-01-01 NOTE — Anesthesia Preprocedure Evaluation (Signed)
Anesthesia Evaluation  Patient identified by MRN, date of birth, ID band Patient awake    Reviewed: Allergy & Precautions, NPO status , Patient's Chart, lab work & pertinent test results  History of Anesthesia Complications Negative for: history of anesthetic complications  Airway Mallampati: II  TM Distance: >3 FB Neck ROM: Full    Dental  (+) Teeth Intact, Dental Advisory Given   Pulmonary neg pulmonary ROS,    Pulmonary exam normal breath sounds clear to auscultation       Cardiovascular hypertension, Normal cardiovascular exam Rhythm:Regular Rate:Normal     Neuro/Psych negative neurological ROS  negative psych ROS   GI/Hepatic negative GI ROS, Neg liver ROS,   Endo/Other  diabetesMorbid obesity  Renal/GU negative Renal ROS     Musculoskeletal negative musculoskeletal ROS (+)   Abdominal   Peds  Hematology negative hematology ROS (+)   Anesthesia Other Findings Day of surgery medications reviewed with the patient.  Reproductive/Obstetrics D&C 12/31/15                             Anesthesia Physical Anesthesia Plan  ASA: III and emergent  Anesthesia Plan: General   Post-op Pain Management:    Induction: Intravenous, Rapid sequence and Cricoid pressure planned  Airway Management Planned: Oral ETT  Additional Equipment:   Intra-op Plan:   Post-operative Plan: Extubation in OR  Informed Consent: I have reviewed the patients History and Physical, chart, labs and discussed the procedure including the risks, benefits and alternatives for the proposed anesthesia with the patient or authorized representative who has indicated his/her understanding and acceptance.   Dental advisory given  Plan Discussed with: CRNA  Anesthesia Plan Comments: (Risks/benefits of general anesthesia discussed with patient including risk of damage to teeth, lips, gum, and tongue, nausea/vomiting,  allergic reactions to medications, and the possibility of heart attack, stroke and death.  All patient questions answered.  Patient wishes to proceed.  Ate at 1900. Will proceed as case is emergent. RSI with GETA.)        Anesthesia Quick Evaluation

## 2016-01-01 NOTE — MAU Note (Signed)
Started bleeding about around 5 got worse when she arrived. Started having clots and heavy bleeding.

## 2016-01-01 NOTE — MAU Provider Note (Signed)
Chief Complaint: Vaginal Bleeding   First Provider Initiated Contact with Patient 01/01/16 2035     SUBJECTIVE HPI: Michelle Lamb is a 25 y.o. [id number] at POD#1 D&C for incomplete AB who presents to Maternity Admissions reporting heavy bleeding and passing clots this evening. No obvious POC's obtained during D&C. Pathology pending.   Associated signs and symptoms: Pos for mild cramping. Neg for fever, chills, dizziness. Unsure if she has passed tissue this evening.   Past Medical History:  Diagnosis Date  . Benign essential HTN   . Diabetes mellitus without complication ([place])   . History of herpes simplex infection   . Hyperlipidemia   . Vaginal delivery 2009, 2010, 2015   OB History  Gravida Para Term Preterm AB Living  5 3 3   1 3   SAB TAB Ectopic Multiple Live Births  1       3    # Outcome Date GA Lbr Len/2nd Weight Sex Delivery Anes PTL Lv  5 Current           4 Term 02/12/14 [redacted]w[redacted]d  6 lb 12 oz (3.062 kg) F Vag-Spont EPI N LIV  3 SAB 2015          2 Term 08/06/08 [redacted]w[redacted]d  6 lb 5 oz (2.863 kg) F Vag-Spont EPI N LIV  1 Term 04/02/07 [redacted]w[redacted]d  5 lb 5 oz (2.41 kg) F Vag-Spont EPI Y LIV     Complications: Preeclampsia,Diabetes mellitus complicating pregnancy    Obstetric Comments  H/o GDM and HTN in her last pregnancy   Past Surgical History:  Procedure Laterality Date  . CHOLECYSTECTOMY    . DILATION AND CURETTAGE OF UTERUS N/A 12/31/2015   Procedure: SUCTION DILATATION AND CURETTAGE;  Surgeon: Aletha Halim, MD;  Location: [facility];  Service: Gynecology;  Laterality: N/A;  . TONSILLECTOMY     Social History   Social History  . Marital status: Single    Spouse name: N/A  . Number of children: N/A  . Years of education: N/A   Occupational History  . Not on file.   Social History Main Topics  . Smoking status: Never Smoker  . Smokeless tobacco: Never Used  . Alcohol use Yes     Comment: OCCASIONAL  . Drug use: No  . Sexual activity: Yes    Birth control/ protection:  None     Comment: Nexplanon removed early 2017   Other Topics Concern  . Not on file   Social History Narrative  . No narrative on file   Family History  Problem Relation Age of Onset  . Hypertension Mother   . Cancer Maternal Grandmother     stomach cancer  . Depression Maternal Grandfather   . Cancer Maternal Grandfather     leukemia  . Depression Paternal Grandmother    No current facility-administered medications on file prior to encounter.    Current Outpatient Prescriptions on File Prior to Encounter  Medication Sig Dispense Refill  . oxyCODONE-acetaminophen (ROXICET) 5-325 MG tablet Take 1 tablet by mouth every 6 (six) hours as needed for severe pain. 4 tablet 0  . Prenatal Vit-Fe Fumarate-FA (PRENATAL MULTIVITAMIN) TABS tablet Take 1 tablet by mouth daily.      No Known Allergies  I have reviewed patient's Past Medical Hx, Surgical Hx, Family Hx, Social Hx, medications and allergies.   Review of Systems  Constitutional: Negative for chills and fever.  Gastrointestinal: Positive for abdominal pain. Negative for nausea and vomiting.  Genitourinary: Positive for vaginal  bleeding. Negative for vaginal pain.  Neurological: Negative for dizziness.    OBJECTIVE Patient Vitals for the past 24 hrs:  BP Temp Temp src Pulse Resp  01/01/16 2135 (!) 138/115 - - 92 -  01/01/16 2124 124/59 - - 91 -  01/01/16 2120 (!) 115/49 - - 89 -  01/01/16 2119 (!) 104/26 - - 92 -  01/01/16 2113 134/67 - - 90 -  01/01/16 2100 144/87 - - - -  01/01/16 2034 154/68 - - 90 -  01/01/16 2016 (!) 165/101 98.8 F (37.1 C) Oral 99 18  Initial 3 BP's w/ incorrect cuff.  Constitutional: Well-developed, well-nourished female in no acute distress. No pallor.  Cardiovascular: normal rate Respiratory: normal rate and effort.  GI: Abd soft, non-tender. MS: Extremities nontender, no edema, normal ROM Neurologic: Alert and oriented x 4.  GU:  SPECULUM EXAM: NEFG, large amount of bright red blood  and clots, cervix clean, but incompletely visualized due to pt intolerance of exam. 2 cm possible fragment of tissue included in clots.   LAB RESULTS Results for orders placed or performed during the hospital encounter of 01/01/16 (from the past 24 hour(s))  Glucose, capillary     Status: Abnormal   Collection Time: 01/01/16  8:20 PM  Result Value Ref Range   Glucose-Capillary 219 (H) 65 - 99 mg/dL  CBC     Status: Abnormal   Collection Time: 01/01/16  8:20 PM  Result Value Ref Range   WBC 13.1 (H) 4.0 - 10.5 K/uL   RBC 4.09 3.87 - 5.11 MIL/uL   Hemoglobin 11.3 (L) 12.0 - 15.0 g/dL   HCT 33.7 (L) 36.0 - 46.0 %   MCV 82.4 78.0 - 100.0 fL   MCH 27.6 26.0 - 34.0 pg   MCHC 33.5 30.0 - 36.0 g/dL   RDW 13.7 11.5 - 15.5 %   Platelets 279 150 - 400 K/uL  Type and screen [place]     Status: None   Collection Time: 01/01/16  8:20 PM  Result Value Ref Range   ABO/RH(D) O POS    Antibody Screen NEG    Sample Expiration 01/04/2016     IMAGING US Ob Transvaginal  Result Date: 12/31/2015 CLINICAL DATA:  Incomplete abortion. By LMP patient is 11 weeks 3 days. LMP 10/12/2015. Patient is gravida 5 para 3 AB 1. EXAM: TRANSVAGINAL OB ULTRASOUND TECHNIQUE: Transvaginal ultrasound was performed for complete evaluation of the gestation as well as the maternal uterus, adnexal regions, and pelvic cul-de-sac. COMPARISON:  12/28/2015 FINDINGS: Intrauterine gestational sac: Possible ; there is a cystic structure within the lower uterine segment endometrial canal. This could represent the gestational sac image previously but now in the lower uterine location. Images provided are inadequate to be confirmatory. Study quality is degraded by patient tolerance for the exam. Yolk sac:  Not seen Embryo:  Not seen Cardiac Activity: Absent Subchorionic hemorrhage:  Fluid identified within the endometrium Maternal uterus/adnexae: Ovaries are not visualized. IMPRESSION: 1. Study today is quite limited  by patient's tolerance for the exam. 2. There is a sac-like structure in the lower uterine endometrial canal. There is a small amount of fluid in the upper uterine canal, possibly indicating gestational sac in the upper canal. Detail is too limited to resolve its location. 3. Consider transabdominal images and/or follow-up imaging when the patient is able. Electronically Signed   By: Nolon Nations M.D.   On: 12/31/2015 14:35   US Ob Transvaginal  Result Date: 12/28/2015  CLINICAL DATA:  Pelvic pain and cramping. Vaginal bleeding. Threatened abortion. First trimester pregnancy with inconclusive fetal viability. EXAM: TRANSVAGINAL OB ULTRASOUND TECHNIQUE: Transvaginal ultrasound was performed for complete evaluation of the gestation as well as the maternal uterus, adnexal regions, and pelvic cul-de-sac. COMPARISON:  11/27/2015 FINDINGS: Intrauterine gestational sac: Single ; irregular shape Yolk sac:  Abnormally enlarged yolk sac versus amnion Embryo:  None visualized MSD: 17  mm   6 w   4  d Subchorionic hemorrhage: Small to moderate subchorionic hemorrhage seen inferior to the gestational sac. Maternal uterus/adnexae: Both ovaries are normal in appearance. No mass or free fluid identified. IMPRESSION: Single abnormal appearing intrauterine gestational sac shows lack of interval progression and no embryo since prior study approximately 1 month ago. Findings meet definitive criteria for failed pregnancy. This follows SRU consensus guidelines: Diagnostic Criteria for Nonviable Pregnancy Early in the First Trimester. Alison Stalling J Med [phone]. Electronically Signed   By: Earle Gell M.D.   On: 12/28/2015 12:05    MAU COURSE Orders Placed This Encounter  Procedures  . Glucose, capillary  . CBC  . Diet NPO time specified Except for: Sips with Meds  . Practitioner attestation of consent  . Complete patient signature process for consent form  . Informed Consent Details: Transcribe to consent form and  obtain patient signature  . Type and screen [place]  . ABO/Rh  . Prepare RBC  Pitocin, Toradol, LR bolus  Dr Glo Herring notified of Hx, exam, current EBL ~600. Will come see pt.  Dr. Glo Herring repeat spec exam. EBL now ~900 ml. Bleeding improved w/ pitocin but still actively bleeding. Will prep for OR for D&C.   MDM/ASSESSMENT POD#1 D&C w/ significant hemorrhage that has not resolved w/ pitocin. Showing hemodynamic changes.   PLAN Prep OR per Dr. Glo Herring.   [place], [place] 01/01/2016  8:47 PM

## 2016-01-01 NOTE — Progress Notes (Signed)
To OR via stretcher

## 2016-01-01 NOTE — Brief Op Note (Signed)
01/01/2016  11:00 PM  PATIENT:  Michelle Lamb  25 y.o. female  PRE-OPERATIVE DIAGNOSIS:  Retained products status post dilation and curettage Missed AB POST-OPERATIVE DIAGNOSIS:  Same completed  PROCEDURE:  Procedure(s): DILATATION AND EVACUATION (N/A)  SURGEON:  Surgeon(s) and Role:    * Jonnie Kind, MD - Primary  PHYSICIAN ASSISTANT:   ASSISTANTS: none   ANESTHESIA:   general and paracervical block  EBL:  Total I/O In: 1000 [I.V.:1000] Out: 666 [Urine:75; Blood:591]  BLOOD ADMINISTERED:none  DRAINS: none   LOCAL MEDICATIONS USED:  MARCAINE    and Amount: 20 ml  SPECIMEN:  Source of Specimen:  Uterine curettings, products of conception  DISPOSITION OF SPECIMEN:  PATHOLOGY  COUNTS:  YES  TOURNIQUET:  * No tourniquets in log *  DICTATION: .Dragon Dictation  PLAN OF CARE: Discharge to home after PACU  PATIENT DISPOSITION:  PACU - hemodynamically stable.   Delay start of Pharmacological VTE agent (>24hrs) due to surgical blood loss or risk of bleeding: yes Heavy vaginal bleeding noted upon return to MAU tonight 24 hours after suction D&C notable for technical difficulties as per operative note No suspicion of perforation . Details of procedure patient was taken operating room prepped and draped for vaginal procedure timeout conducted. Patient was hemodynamically stable. Cervix was grasped with single-tooth tenaculum and uterus sounded to 11 cm in the anteflexed position as previously confirmed by ultrasound in the MAU, and then uterus easily dilated to 31 [place] allowing introduction of a 10 mm suction curette which was used in a circumferential fashion under 60 mmHg pressure removing some blood and significant amounts of tissue products of conception estimated 50 cc. Sharp curettage confirmed that the uterine cavity was smooth and uniform gritty feel was obtained the procedure was successfully completed. Paracervical block had been applied at the start of the  procedure. Minimal bleeding was present at the end of the case. Patient will go to recovery room and then be discharged home

## 2016-01-01 NOTE — H&P (Signed)
Chief Complaint: Vaginal Bleeding   First Provider Initiated Contact with Patient 01/01/16 2035     SUBJECTIVE HPI: Michelle Lamb is a 25 y.o. [id number] at POD#1 D&C for incomplete AB who presents to Maternity Admissions reporting heavy bleeding and passing clots this evening. No obvious POC's obtained during D&C. Pathology pending.   Associated signs and symptoms: Pos for mild cramping. Neg for fever, chills, dizziness. Unsure if she has passed tissue this evening.       Past Medical History:  Diagnosis Date  . Benign essential HTN   . Diabetes mellitus without complication ([place])   . History of herpes simplex infection   . Hyperlipidemia   . Vaginal delivery 2009, 2010, 2015                   OB History  Gravida Para Term Preterm AB Living  5 3 3   1 3   SAB TAB Ectopic Multiple Live Births  1       3    # Outcome Date GA Lbr Len/2nd Weight Sex Delivery Anes PTL Lv  5 Current           4 Term 02/12/14 [redacted]w[redacted]d  6 lb 12 oz (3.062 kg) F Vag-Spont EPI N LIV  3 SAB 2015          2 Term 08/06/08 [redacted]w[redacted]d  6 lb 5 oz (2.863 kg) F Vag-Spont EPI N LIV  1 Term 04/02/07 [redacted]w[redacted]d  5 lb 5 oz (2.41 kg) F Vag-Spont EPI Y LIV     Complications: Preeclampsia,Diabetes mellitus complicating pregnancy    Obstetric Comments  H/o GDM and HTN in her last pregnancy        Past Surgical History:  Procedure Laterality Date  . CHOLECYSTECTOMY    . DILATION AND CURETTAGE OF UTERUS N/A 12/31/2015   Procedure: SUCTION DILATATION AND CURETTAGE;  Surgeon: Aletha Halim, MD;  Location: [facility];  Service: Gynecology;  Laterality: N/A;  . TONSILLECTOMY     Social History        Social History  . Marital status: Single    Spouse name: N/A  . Number of children: N/A  . Years of education: N/A      Occupational History  . Not on file.         Social History Main Topics  . Smoking status: Never Smoker  . Smokeless tobacco: Never Used  . Alcohol use Yes      Comment: OCCASIONAL  . Drug use: No  . Sexual activity: Yes    Birth control/ protection: None     Comment: Nexplanon removed early 2017       Other Topics Concern  . Not on file      Social History Narrative  . No narrative on file         Family History  Problem Relation Age of Onset  . Hypertension Mother   . Cancer Maternal Grandmother     stomach cancer  . Depression Maternal Grandfather   . Cancer Maternal Grandfather     leukemia  . Depression Paternal Grandmother    No current facility-administered medications on file prior to encounter.          Current Outpatient Prescriptions on File Prior to Encounter  Medication Sig Dispense Refill  . oxyCODONE-acetaminophen (ROXICET) 5-325 MG tablet Take 1 tablet by mouth every 6 (six) hours as needed for severe pain. 4 tablet 0  . Prenatal Vit-Fe Fumarate-FA (PRENATAL MULTIVITAMIN) TABS tablet Take  1 tablet by mouth daily.      No Known Allergies  I have reviewed patient's Past Medical Hx, Surgical Hx, Family Hx, Social Hx, medications and allergies.   Review of Systems  Constitutional: Negative for chills and fever.  Gastrointestinal: Positive for abdominal pain. Negative for nausea and vomiting.  Genitourinary: Positive for vaginal bleeding. Negative for vaginal pain.  Neurological: Negative for dizziness.    OBJECTIVE Patient Vitals for the past 24 hrs:  BP Temp Temp src Pulse Resp  01/01/16 2135 (!) 138/115 - - 92 -  01/01/16 2124 124/59 - - 91 -  01/01/16 2120 (!) 115/49 - - 89 -  01/01/16 2119 (!) 104/26 - - 92 -  01/01/16 2113 134/67 - - 90 -  01/01/16 2100 144/87 - - - -  01/01/16 2034 154/68 - - 90 -  01/01/16 2016 (!) 165/101 98.8 F (37.1 C) Oral 99 18  Initial 3 BP's w/ incorrect cuff.  Constitutional: Well-developed, well-nourished female in no acute distress. No pallor.  Cardiovascular: normal rate Respiratory: normal rate and effort.  GI: Abd soft, non-tender. MS:  Extremities nontender, no edema, normal ROM Neurologic: Alert and oriented x 4.  GU:      SPECULUM EXAM: NEFG, large amount of bright red blood and clots, cervix clean, but incompletely visualized due to pt intolerance of exam. 2 cm possible fragment of tissue included in clots.   LAB RESULTS Lab Results Last 24 Hours       Results for orders placed or performed during the hospital encounter of 01/01/16 (from the past 24 hour(s))  Glucose, capillary     Status: Abnormal   Collection Time: 01/01/16  8:20 PM  Result Value Ref Range   Glucose-Capillary 219 (H) 65 - 99 mg/dL  CBC     Status: Abnormal   Collection Time: 01/01/16  8:20 PM  Result Value Ref Range   WBC 13.1 (H) 4.0 - 10.5 K/uL   RBC 4.09 3.87 - 5.11 MIL/uL   Hemoglobin 11.3 (L) 12.0 - 15.0 g/dL   HCT 33.7 (L) 36.0 - 46.0 %   MCV 82.4 78.0 - 100.0 fL   MCH 27.6 26.0 - 34.0 pg   MCHC 33.5 30.0 - 36.0 g/dL   RDW 13.7 11.5 - 15.5 %   Platelets 279 150 - 400 K/uL  Type and screen [place]     Status: None   Collection Time: 01/01/16  8:20 PM  Result Value Ref Range   ABO/RH(D) O POS    Antibody Screen NEG    Sample Expiration 01/04/2016       IMAGING  Imaging Results  US Ob Transvaginal  Result Date: 12/31/2015 CLINICAL DATA:  Incomplete abortion. By LMP patient is 11 weeks 3 days. LMP 10/12/2015. Patient is gravida 5 para 3 AB 1. EXAM: TRANSVAGINAL OB ULTRASOUND TECHNIQUE: Transvaginal ultrasound was performed for complete evaluation of the gestation as well as the maternal uterus, adnexal regions, and pelvic cul-de-sac. COMPARISON:  12/28/2015 FINDINGS: Intrauterine gestational sac: Possible ; there is a cystic structure within the lower uterine segment endometrial canal. This could represent the gestational sac image previously but now in the lower uterine location. Images provided are inadequate to be confirmatory. Study quality is degraded by patient tolerance for the exam.  Yolk sac:  Not seen Embryo:  Not seen Cardiac Activity: Absent Subchorionic hemorrhage:  Fluid identified within the endometrium Maternal uterus/adnexae: Ovaries are not visualized. IMPRESSION: 1. Study today is quite limited by patient's  tolerance for the exam. 2. There is a sac-like structure in the lower uterine endometrial canal. There is a small amount of fluid in the upper uterine canal, possibly indicating gestational sac in the upper canal. Detail is too limited to resolve its location. 3. Consider transabdominal images and/or follow-up imaging when the patient is able. Electronically Signed   By: Nolon Nations M.D.   On: 12/31/2015 14:35   US Ob Transvaginal  Result Date: 12/28/2015 CLINICAL DATA:  Pelvic pain and cramping. Vaginal bleeding. Threatened abortion. First trimester pregnancy with inconclusive fetal viability. EXAM: TRANSVAGINAL OB ULTRASOUND TECHNIQUE: Transvaginal ultrasound was performed for complete evaluation of the gestation as well as the maternal uterus, adnexal regions, and pelvic cul-de-sac. COMPARISON:  11/27/2015 FINDINGS: Intrauterine gestational sac: Single ; irregular shape Yolk sac:  Abnormally enlarged yolk sac versus amnion Embryo:  None visualized MSD: 17  mm   6 w   4  d Subchorionic hemorrhage: Small to moderate subchorionic hemorrhage seen inferior to the gestational sac. Maternal uterus/adnexae: Both ovaries are normal in appearance. No mass or free fluid identified. IMPRESSION: Single abnormal appearing intrauterine gestational sac shows lack of interval progression and no embryo since prior study approximately 1 month ago. Findings meet definitive criteria for failed pregnancy. This follows SRU consensus guidelines: Diagnostic Criteria for Nonviable Pregnancy Early in the First Trimester. Alison Stalling J Med [phone]. Electronically Signed   By: Earle Gell M.D.   On: 12/28/2015 12:05     MAU COURSE    Orders Placed This Encounter  Procedures  .  Glucose, capillary  . CBC  . Diet NPO time specified Except for: Sips with Meds  . Practitioner attestation of consent  . Complete patient signature process for consent form  . Informed Consent Details: Transcribe to consent form and obtain patient signature  . Type and screen [place]  . ABO/Rh  . Prepare RBC  Pitocin, Toradol, LR bolus  Dr Glo Herring notified of Hx, exam, current EBL ~600. Will come see pt.  Dr. Glo Herring repeat spec exam. EBL now ~900 ml. Bleeding improved w/ pitocin but still actively bleeding. Will prep for OR for D&C.   MDM/ASSESSMENT POD#1 D&C w/ significant hemorrhage that has not resolved w/ pitocin. Showing hemodynamic changes.   PLAN Prep OR per Dr. Glo Herring.   [place], [place] 01/01/2016  8:47 PM

## 2016-01-01 NOTE — Op Note (Signed)
Please see the brief operative note for details 

## 2016-01-02 LAB — ABO/RH: ABO/RH(D): O POS

## 2016-01-02 NOTE — Anesthesia Postprocedure Evaluation (Signed)
Anesthesia Post Note  Patient: Michelle Lamb  Procedure(s) Performed: Procedure(s) (LRB): DILATATION AND EVACUATION (N/A)  Patient location during evaluation: PACU Anesthesia Type: General Level of consciousness: awake and alert Pain management: pain level controlled Vital Signs Assessment: post-procedure vital signs reviewed and stable Respiratory status: spontaneous breathing, nonlabored ventilation, respiratory function stable and patient connected to nasal cannula oxygen Cardiovascular status: blood pressure returned to baseline and stable Postop Assessment: no signs of nausea or vomiting Anesthetic complications: no     Last Vitals:  Vitals:   01/01/16 2345 01/02/16 0035  BP: (!) 114/53 134/70  Pulse: 82   Resp: 20 18  Temp: 36.9 C 36.9 C    Last Pain:  Vitals:   01/01/16 2016  TempSrc: Oral   Pain Goal:                 Catalina Gravel

## 2016-01-04 ENCOUNTER — Telehealth: Payer: Self-pay | Admitting: General Practice

## 2016-01-04 ENCOUNTER — Encounter: Payer: Self-pay | Admitting: *Deleted

## 2016-01-04 ENCOUNTER — Encounter (HOSPITAL_COMMUNITY): Payer: Self-pay | Admitting: Obstetrics and Gynecology

## 2016-01-04 NOTE — Telephone Encounter (Signed)
Patient called and left message stating she has a question about returning to work. Called patient and she states she had a procedure on 11/2 and had to have another surgery on 11/3. Patient wants to know when she can return back to work. Spoke with Dr Ihor Dow who states patient can return a week from surgery or whenever she feels able. Discussed with patient. Patient states she will come by the office tomorrow to pick up a note. Patient is thinking about returning on Wednesday to work. Patient had no questions

## 2016-01-05 ENCOUNTER — Ambulatory Visit (HOSPITAL_COMMUNITY): Payer: 59

## 2016-01-05 LAB — TYPE AND SCREEN
ABO/RH(D): O POS
Antibody Screen: NEGATIVE
UNIT DIVISION: 0
Unit division: 0

## 2016-01-11 ENCOUNTER — Ambulatory Visit (INDEPENDENT_AMBULATORY_CARE_PROVIDER_SITE_OTHER): Payer: 59 | Admitting: Clinical

## 2016-01-11 ENCOUNTER — Encounter: Payer: 59 | Admitting: Obstetrics and Gynecology

## 2016-01-11 ENCOUNTER — Encounter: Payer: Self-pay | Admitting: Obstetrics and Gynecology

## 2016-01-11 ENCOUNTER — Ambulatory Visit (INDEPENDENT_AMBULATORY_CARE_PROVIDER_SITE_OTHER): Payer: 59 | Admitting: Obstetrics and Gynecology

## 2016-01-11 VITALS — BP 112/69 | HR 86 | Temp 98.3°F | Wt 293.8 lb

## 2016-01-11 DIAGNOSIS — R1030 Lower abdominal pain, unspecified: Secondary | ICD-10-CM | POA: Diagnosis not present

## 2016-01-11 DIAGNOSIS — Z1389 Encounter for screening for other disorder: Secondary | ICD-10-CM

## 2016-01-11 DIAGNOSIS — Z1331 Encounter for screening for depression: Secondary | ICD-10-CM

## 2016-01-11 DIAGNOSIS — F4321 Adjustment disorder with depressed mood: Secondary | ICD-10-CM

## 2016-01-11 DIAGNOSIS — F4320 Adjustment disorder, unspecified: Secondary | ICD-10-CM

## 2016-01-11 LAB — POCT URINALYSIS DIP (DEVICE)
Bilirubin Urine: NEGATIVE
Glucose, UA: NEGATIVE mg/dL
Hgb urine dipstick: NEGATIVE
KETONES UR: NEGATIVE mg/dL
Leukocytes, UA: NEGATIVE
Nitrite: NEGATIVE
PH: 6 (ref 5.0–8.0)
PROTEIN: NEGATIVE mg/dL
Specific Gravity, Urine: 1.025 (ref 1.005–1.030)
Urobilinogen, UA: 0.2 mg/dL (ref 0.0–1.0)

## 2016-01-11 NOTE — BH Specialist Note (Signed)
Session Start time: 8:35   End Time: 9:20 Total Time:  45 minutes Type of Service: [place] Interpreter: No.   Interpreter Name & Language: n/a # [facility] Visits July 2017-June 2018: 1st   SUBJECTIVE: Michelle Lamb is a 25 y.o. female  Pt. was referred by Aletha Halim, MD for:  anxiety and depression. Pt. reports the following symptoms/concerns: Pt states that her primary concern is that she is not sleeping well, and is easily irritated after recent miscarriage; very open to try additional strategies to cope with overwhelming life stress, including anxiety/stress when she sees happy mothers/babies. Duration of problem:  Less than two weeks Severity: moderate Previous treatment: treated with antidepressant after first miscarriage, uncertain about taking again   OBJECTIVE: Mood: Depressed & Affect: Tearful Risk of harm to self or others: No known risk of harm to self or others Assessments administered: PHQ9: 15/ GAD7: 16  LIFE CONTEXT:  Family & Social: Lives with fiancee and two children; does not feel she can talk about feelings with friends/family School/ Work: Working fulltime, thinks she may have returned to work too soon  Self-Care: Little appetite or sleep, in mourning over loss Life changes: Recent miscarriage What is important to pt/family (values): Children and fiancee, having a child with fiancee   GOALS ADDRESSED:  -Alleviate symptoms of anxiety and depression  INTERVENTIONS: Strength-based, Supportive and Meditation: CALM relaxation breathing exercise   ASSESSMENT:  Pt currently experiencing Grief.  Pt may benefit from psychoeducation and brief therapeutic intervention regarding coping with symptoms of anxiety and depression related to grief.      PLAN: 1. F/U with behavioral health clinician: Two weeks, or as needed/ (Discuss BH meds and additional grief support at next visit) 2. Behavioral Health meds: none  3. Behavioral  recommendations:  -Talk to medical provider at next visit about [place] meds, if symptoms do not improve -Practice daily relaxation breathing exercise -Consider Worry Hour strategy to cope with daily life stressors -Read educational materials regarding coping with symptoms of anxiety and depression 4. Referral: Brief Counseling/Psychotherapy and Psychoeducation   [place] Clinician  Geraldine Contras:   Warm Hand Off Completed.        Depression screen [facility] 2/9 01/11/2016 12/28/2015  Decreased Interest 2 1  Down, Depressed, Hopeless 3 0  PHQ - 2 Score 5 1  Altered sleeping 3 0  Tired, decreased energy 2 1  Change in appetite 0 0  Feeling bad or failure about yourself  3 0  Trouble concentrating 2 0  Moving slowly or fidgety/restless 0 0  Suicidal thoughts 0 0  PHQ-9 Score 15 2   GAD 7 : Generalized Anxiety Score 01/11/2016 12/28/2015  Nervous, Anxious, on Edge 3 0  Control/stop worrying 3 0  Worry too much - different things 3 0  Trouble relaxing 2 0  Restless 0 0  Easily annoyed or irritable 3 0  Afraid - awful might happen 2 0  Total GAD 7 Score 16 0

## 2016-01-11 NOTE — Progress Notes (Signed)
Obstetrics and Gynecology Visit Post Op Evaluation  Appointment Date: 01/11/2016  OBGYN Clinic: Center for [facility]  Primary Care Provider: No PCP Per Patient  Chief Complaint:  Chief Complaint  Patient presents with  . Follow-up    History of Present Illness: Michelle Lamb is a 25 y.o. African-American [id number] (Patient's last menstrual period was 10/10/2015 (exact date).), seen for the above chief complaint. Her past medical history is significant for DM2, HTN, BMI 50s   Patient is s/p 11/2 suction D&C for incomplete AB in early pregnancy and had repeat procedure on 11/3 for rPOCs and bleeding. Pt was discharged from the PACU both times.  She denies any fevers, chills, nausea, vomiting, dysuria, VB, discharge. Has occasional low belly cramps and is feeling down about the miscarriage. Pt back at work and doing well there.    CBC Latest Ref Rng & Units 01/01/2016 01/01/2016 12/31/2015  WBC 4.0 - 10.5 K/uL 12.1(H) 13.1(H) 12.0(H)  Hemoglobin 12.0 - 15.0 g/dL 9.7(L) 11.3(L) 13.1  Hematocrit 36.0 - 46.0 % 29.4(L) 33.7(L) 38.6  Platelets 150 - 400 K/uL 245 279 239   Pathology from both surgeries were appropriate with negative POCs seen  Review of Systems: Her 12 point review of systems is negative or as noted in the History of Present Illness.  Past Medical History:  Past Medical History:  Diagnosis Date  . Benign essential HTN   . Diabetes mellitus without complication ([place])    TYPE 2 LAST DOSE FRIDAY OCTOBER 27 (METFORMIN)  . History of herpes simplex infection   . Hyperlipidemia   . Vaginal delivery 2009, 2010, 2015    Past Surgical History:  Past Surgical History:  Procedure Laterality Date  . CHOLECYSTECTOMY    . DILATION AND CURETTAGE OF UTERUS N/A 12/31/2015   Procedure: SUCTION DILATATION AND CURETTAGE;  Surgeon: Aletha Halim, MD;  Location: [facility];  Service: Gynecology;  Laterality: N/A;  . DILATION AND EVACUATION N/A 01/01/2016   Procedure: DILATATION  AND EVACUATION;  Surgeon: Jonnie Kind, MD;  Location: [facility];  Service: Gynecology;  Laterality: N/A;  . TONSILLECTOMY      Past Obstetrical History:  OB History  Gravida Para Term Preterm AB Living  5 3 3   1 3   SAB TAB Ectopic Multiple Live Births  1       3    # Outcome Date GA Lbr Len/2nd Weight Sex Delivery Anes PTL Lv  5 Gravida           4 Term 02/12/14 [redacted]w[redacted]d  6 lb 12 oz (3.062 kg) F Vag-Spont EPI N LIV  3 SAB 2015          2 Term 08/06/08 [redacted]w[redacted]d  6 lb 5 oz (2.863 kg) F Vag-Spont EPI N LIV  1 Term 04/02/07 [redacted]w[redacted]d  5 lb 5 oz (2.41 kg) F Vag-Spont EPI Y LIV     Complications: Preeclampsia,Diabetes mellitus complicating pregnancy    Obstetric Comments  H/o GDM and HTN in her last pregnancy   Past Gynecological History: As per HPI. History of pap smears: Yes. Last pap smear 2015 (negative). H/o CIN in 2012 History of STIs: Yes  Social History:  Social History   Social History  . Marital status: Single    Spouse name: N/A  . Number of children: N/A  . Years of education: N/A   Occupational History  . Not on file.   Social History Main Topics  . Smoking status: Never Smoker  . Smokeless tobacco: Never  Used  . Alcohol use Yes     Comment: OCCASIONAL  . Drug use: No  . Sexual activity: Yes    Birth control/ protection: None     Comment: Nexplanon removed early 2017   Other Topics Concern  . Not on file   Social History Narrative  . No narrative on file    Family History:  Family History  Problem Relation Age of Onset  . Hypertension Mother   . Cancer Maternal Grandmother     stomach cancer  . Depression Maternal Grandfather   . Cancer Maternal Grandfather     leukemia  . Depression Paternal Grandmother    She denies any female cancers, bleeding or blood clotting disorders.   Medications Michelle Lamb had no medications administered during this visit. Current Outpatient Prescriptions  Medication Sig Dispense Refill  . acetaminophen (TYLENOL) 500 MG  tablet Take 1,000 mg by mouth every 6 (six) hours as needed for mild pain, moderate pain or headache.    . Prenatal Vit-Fe Fumarate-FA (PRENATAL MULTIVITAMIN) TABS tablet Take 1 tablet by mouth daily.     . misoprostol (CYTOTEC) 200 MCG tablet Take 1 tablet (200 mcg total) by mouth 2 (two) times daily before a meal. (Patient not taking: Reported on 01/11/2016) 4 tablet 0  . oxyCODONE-acetaminophen (ROXICET) 5-325 MG tablet Take 1 tablet by mouth every 6 (six) hours as needed for severe pain. (Patient not taking: Reported on 01/11/2016) 4 tablet 0   No current facility-administered medications for this visit.     Allergies Patient has no known allergies.   Physical Exam:  BP 112/69   Pulse 86   Temp 98.3 F (36.8 C)   Wt 293 lb 12.8 oz (133.3 kg)   LMP 10/10/2015 (Exact Date)   Breastfeeding? No   BMI 52.04 kg/m  Body mass index is 52.04 kg/m. General appearance: Well nourished, well developed female in no acute distress.  Abdomen: positive bowel sounds and no masses, hernias; diffusely non tender to palpation, non distended Neuro/Psych:  Normal mood and affect.  Skin:  Warm and dry.   Laboratory: as above  Radiology: as above   Assessment: pt with appropriate depressive s/s but physically doing well  Plan:  D/w pt re: surgery and post op course and miscarriage and patient grieiving appropriately and she's amenable to seeing Vesta Mixer SW today. Will see patient back in about two weeks for exam and d/w re: contraception, PCP referral and f/u mood  Patient contracts for safety  Orders Placed This Encounter  Procedures  . Urine Culture  . Ambulatory referral to [place]  . POCT urinalysis dip (device)    RTC 2wks  Durene Romans MD Attending Center for [facility])

## 2016-01-15 LAB — URINE CULTURE

## 2016-01-18 ENCOUNTER — Other Ambulatory Visit: Payer: Self-pay

## 2016-01-18 MED ORDER — NITROFURANTOIN MONOHYD MACRO 100 MG PO CAPS: 100.0000 mg | ORAL_CAPSULE | Freq: Two times a day (BID) | ORAL | 0 refills | Status: DC

## 2016-01-18 NOTE — Telephone Encounter (Signed)
Patient has been notified of result and medications has been called into her pharmacy.

## 2016-01-27 ENCOUNTER — Ambulatory Visit: Payer: 59 | Admitting: Obstetrics and Gynecology

## 2016-01-28 ENCOUNTER — Encounter: Payer: Self-pay | Admitting: Obstetrics and Gynecology

## 2016-01-28 NOTE — Progress Notes (Signed)
Patient did not keep GYN follow up appointment for 01/27/2016.  Durene Romans MD Attending Center for [employer])

## 2016-02-10 ENCOUNTER — Ambulatory Visit: Payer: 59 | Admitting: Obstetrics and Gynecology

## 2016-02-10 ENCOUNTER — Encounter: Payer: Self-pay | Admitting: *Deleted

## 2016-02-10 NOTE — Progress Notes (Signed)
Michelle Lamb missed a scheduled post op appointment again. Per discussion with provider - no need to call patient. May reschedule if she calls.

## 2016-02-24 ENCOUNTER — Telehealth: Payer: Self-pay

## 2016-02-24 NOTE — Telephone Encounter (Signed)
LM for pt that her FMLA paperwork has been filled out and faxed.  Her original copy is available for her to keep up at the front office. If she has any questions to please give the office a call.

## 2016-02-29 DIAGNOSIS — Z5189 Encounter for other specified aftercare: Secondary | ICD-10-CM

## 2016-02-29 HISTORY — DX: Encounter for other specified aftercare: Z51.89

## 2016-03-18 DIAGNOSIS — J111 Influenza due to unidentified influenza virus with other respiratory manifestations: Secondary | ICD-10-CM | POA: Diagnosis not present

## 2016-06-08 ENCOUNTER — Inpatient Hospital Stay (HOSPITAL_COMMUNITY)
Admission: AD | Admit: 2016-06-08 | Discharge: 2016-06-08 | Discharge status: Against Medical Advice | Payer: Medicaid Other | Source: Ambulatory Visit | Attending: Obstetrics & Gynecology | Admitting: Obstetrics & Gynecology

## 2016-06-08 ENCOUNTER — Encounter (HOSPITAL_COMMUNITY): Payer: Self-pay | Admitting: *Deleted

## 2016-06-08 DIAGNOSIS — Z5321 Procedure and treatment not carried out due to patient leaving prior to being seen by health care provider: Secondary | ICD-10-CM | POA: Insufficient documentation

## 2016-06-08 LAB — URINALYSIS, ROUTINE W REFLEX MICROSCOPIC
BILIRUBIN URINE: NEGATIVE
Glucose, UA: >=500 mg/dL — AB
HGB URINE DIPSTICK: NEGATIVE
KETONES UR: 5 mg/dL — AB
Leukocytes, UA: NEGATIVE
NITRITE: NEGATIVE
Protein, ur: NEGATIVE mg/dL
SPECIFIC GRAVITY, URINE: 1.027 (ref 1.005–1.030)
pH: 6 (ref 5.0–8.0)

## 2016-06-08 NOTE — MAU Note (Signed)
Pt C/O upper abd pain, if she sits forward she has lower abd pain - started today.  Also having mid-upper back pain today.  Denies bleeding, having white discharge.  Get [facility] in [place].

## 2016-06-08 NOTE — Progress Notes (Signed)
Pt left after being triaged.

## 2017-01-31 ENCOUNTER — Encounter (HOSPITAL_COMMUNITY): Payer: Self-pay

## 2018-03-03 DIAGNOSIS — R05 Cough: Secondary | ICD-10-CM | POA: Diagnosis not present

## 2018-04-13 DIAGNOSIS — R51 Headache: Secondary | ICD-10-CM | POA: Diagnosis not present

## 2018-12-04 DIAGNOSIS — Z03818 Encounter for observation for suspected exposure to other biological agents ruled out: Secondary | ICD-10-CM | POA: Diagnosis not present

## 2019-01-14 DIAGNOSIS — Z20828 Contact with and (suspected) exposure to other viral communicable diseases: Secondary | ICD-10-CM | POA: Diagnosis not present

## 2019-06-20 DIAGNOSIS — I10 Essential (primary) hypertension: Secondary | ICD-10-CM | POA: Insufficient documentation

## 2019-06-20 DIAGNOSIS — E1169 Type 2 diabetes mellitus with other specified complication: Secondary | ICD-10-CM | POA: Insufficient documentation

## 2019-06-20 HISTORY — DX: Essential (primary) hypertension: I10

## 2019-10-29 DIAGNOSIS — G4733 Obstructive sleep apnea (adult) (pediatric): Secondary | ICD-10-CM

## 2019-10-29 HISTORY — DX: Obstructive sleep apnea (adult) (pediatric): G47.33

## 2019-12-25 ENCOUNTER — Encounter: Payer: Self-pay | Admitting: Family Medicine

## 2019-12-25 ENCOUNTER — Ambulatory Visit (INDEPENDENT_AMBULATORY_CARE_PROVIDER_SITE_OTHER): Payer: No Typology Code available for payment source | Admitting: Family Medicine

## 2019-12-25 VITALS — BP 140/96 | HR 94 | Temp 97.3°F | Resp 16 | Ht 63.0 in | Wt 283.0 lb

## 2019-12-25 DIAGNOSIS — R1031 Right lower quadrant pain: Secondary | ICD-10-CM

## 2019-12-25 DIAGNOSIS — G4489 Other headache syndrome: Secondary | ICD-10-CM

## 2019-12-25 DIAGNOSIS — R42 Dizziness and giddiness: Secondary | ICD-10-CM | POA: Diagnosis not present

## 2019-12-25 DIAGNOSIS — N3001 Acute cystitis with hematuria: Secondary | ICD-10-CM

## 2019-12-25 LAB — POCT URINALYSIS DIP (CLINITEK)
Bilirubin, UA: NEGATIVE
Glucose, UA: NEGATIVE mg/dL
Nitrite, UA: POSITIVE — AB
POC PROTEIN,UA: 100 — AB
Spec Grav, UA: >=1.03 — AB (ref 1.010–1.025)
Urobilinogen, UA: 1 E.U./dL
pH, UA: 6 (ref 5.0–8.0)

## 2019-12-25 NOTE — Progress Notes (Signed)
Acute Office Visit  Subjective:    Patient ID: Michelle Lamb, female    DOB: 1991-02-10, 29 y.o.   MRN: [id number]  Chief Complaint  Patient presents with  . Dizziness  . Abdominal Pain   HPI:  Patient is in today for dizziness, nauseas, abdominal pain and headache on the top of the head and forehead. She tried tylenol for a little bit and it did not help too long. Patient had covid on 09/2019. She has not been exposed to covid. Patient denied cough, fever,  Body aches. Patient is diabetic and she is taking trulicity 1.5 mg weekly, metformin. She checked her sugar everyday. Today was 106 mg/dl.  Dizziness Associated symptoms include abdominal pain and headaches. Pertinent negatives include no arthralgias, chest pain, congestion, coughing, fatigue, fever, myalgias, sore throat or weakness.  Abdominal Pain Associated symptoms include headaches. Pertinent negatives include no arthralgias, fever or myalgias.     Past Medical History:  Diagnosis Date  . Benign essential HTN   . Diabetes mellitus without complication ([place])    TYPE 2 LAST DOSE FRIDAY OCTOBER 27 (METFORMIN)  . History of herpes simplex infection   . Hyperlipidemia   . Vaginal delivery 2009, 2010, 2015    Past Surgical History:  Procedure Laterality Date  . CESAREAN SECTION  09/29/2016  . CHOLECYSTECTOMY    . DILATION AND CURETTAGE OF UTERUS N/A 12/31/2015   Procedure: SUCTION DILATATION AND CURETTAGE;  Surgeon: Aletha Halim, MD;  Location: [facility];  Service: Gynecology;  Laterality: N/A;  . DILATION AND EVACUATION N/A 01/01/2016   Procedure: DILATATION AND EVACUATION;  Surgeon: Jonnie Kind, MD;  Location: [facility];  Service: Gynecology;  Laterality: N/A;  . TONSILLECTOMY      Family History  Problem Relation Age of Onset  . Hypertension Mother   . Cancer Maternal Grandmother        stomach cancer  . Depression Maternal Grandfather   . Cancer Maternal Grandfather        leukemia  . Depression Paternal  Grandmother     Social History   Socioeconomic History  . Marital status: Married    Spouse name: Not on file  . Number of children: 4  . Years of education: Not on file  . Highest education level: Not on file  Occupational History  . Occupation: Physiological scientist  Tobacco Use  . Smoking status: Never Smoker  . Smokeless tobacco: Never Used  Substance and Sexual Activity  . Alcohol use: Yes    Comment: OCCASIONAL  . Drug use: No  . Sexual activity: Yes    Birth control/protection: None    Comment: Nexplanon removed early 2017  Other Topics Concern  . Not on file  Social History Narrative  . Not on file   Social Determinants of Health   Financial Resource Strain:   . Difficulty of Paying Living Expenses: Not on file  Food Insecurity:   . Worried About Charity fundraiser in the Last Year: Not on file  . Ran Out of Food in the Last Year: Not on file  Transportation Needs:   . Lack of Transportation (Medical): Not on file  . Lack of Transportation (Non-Medical): Not on file  Physical Activity:   . Days of Exercise per Week: Not on file  . Minutes of Exercise per Session: Not on file  Stress:   . Feeling of Stress : Not on file  Social Connections:   . Frequency of Communication with Friends and  Family: Not on file  . Frequency of Social Gatherings with Friends and Family: Not on file  . Attends Religious Services: Not on file  . Active Member of Clubs or Organizations: Not on file  . Attends Archivist Meetings: Not on file  . Marital Status: Not on file  Intimate Partner Violence:   . Fear of Current or Ex-Partner: Not on file  . Emotionally Abused: Not on file  . Physically Abused: Not on file  . Sexually Abused: Not on file    Outpatient Medications Prior to Visit  Medication Sig Dispense Refill  . albuterol (VENTOLIN HFA) 108 (90 Base) MCG/ACT inhaler Inhale 2 puffs into the lungs every 6 (six) hours as needed.    .  budesonide-formoterol (SYMBICORT) 80-4.5 MCG/ACT inhaler Inhale 2 puffs into the lungs 2 (two) times daily.    . DULoxetine (CYMBALTA) 60 MG capsule Take 60 mg by mouth daily.    Marland Kitchen escitalopram (LEXAPRO) 5 MG tablet Take 5 mg by mouth daily.    Marland Kitchen glucose blood (ACCU-CHEK GUIDE) test strip USE 1 STRIP TO CHECK GLUCOSE THREE TIMES DAILY    . hydrochlorothiazide (HYDRODIURIL) 12.5 MG tablet Take 12.5 mg by mouth daily.    . hydrOXYzine (ATARAX/VISTARIL) 10 MG tablet Take 10 mg by mouth 2 (two) times daily.    . metFORMIN (GLUCOPHAGE-XR) 500 MG 24 hr tablet Take 500 mg by mouth daily.    . metoprolol tartrate (LOPRESSOR) 50 MG tablet Take 50 mg by mouth 2 (two) times daily.    . phentermine (ADIPEX-P) 37.5 MG tablet One po QAM    . topiramate (TOPAMAX) 50 MG tablet One po daily    . TRULICITY 1.5 VZ/5.6LO SOPN Inject into the skin.    . Cholecalciferol 125 MCG (5000 UT) capsule Take by mouth.    . cyanocobalamin 1000 MCG tablet Take by mouth.    . insulin glargine (LANTUS SOLOSTAR) 100 UNIT/ML Solostar Pen Inject into the skin.    Marland Kitchen acetaminophen (TYLENOL) 500 MG tablet Take 1,000 mg by mouth every 6 (six) hours as needed for mild pain, moderate pain or headache.    Marland Kitchen amLODipine (NORVASC) 5 MG tablet Take 5 mg by mouth daily.    . cloNIDine (CATAPRES) 0.1 MG tablet     . dicyclomine (BENTYL) 20 MG tablet Take 20 mg by mouth 3 (three) times daily.    . misoprostol (CYTOTEC) 200 MCG tablet Take 1 tablet (200 mcg total) by mouth 2 (two) times daily before a meal. (Patient not taking: Reported on 01/11/2016) 4 tablet 0  . nitrofurantoin, macrocrystal-monohydrate, (MACROBID) 100 MG capsule Take 1 capsule (100 mg total) by mouth 2 (two) times daily. 14 capsule 0  . omeprazole (PRILOSEC) 40 MG capsule Take 40 mg by mouth 2 (two) times daily.    Marland Kitchen oxyCODONE-acetaminophen (ROXICET) 5-325 MG tablet Take 1 tablet by mouth every 6 (six) hours as needed for severe pain. (Patient not taking: Reported on  01/11/2016) 4 tablet 0  . Prenatal Vit-Fe Fumarate-FA (PRENATAL MULTIVITAMIN) TABS tablet Take 1 tablet by mouth daily.      No facility-administered medications prior to visit.    No Known Allergies  Review of Systems  Constitutional: Negative for fatigue and fever.  HENT: Negative for congestion, ear pain, sinus pressure and sore throat.   Eyes: Negative for visual disturbance.  Respiratory: Negative for cough and shortness of breath.   Cardiovascular: Negative for chest pain and palpitations.  Gastrointestinal: Positive for abdominal pain.  Endocrine: Negative for polydipsia, polyphagia and polyuria.  Musculoskeletal: Negative for arthralgias, back pain and myalgias.  Neurological: Positive for dizziness and headaches. Negative for weakness.       Objective:    Physical Exam Constitutional:      Appearance: She is obese.  HENT:     Right Ear: Tympanic membrane, ear canal and external ear normal.     Left Ear: Tympanic membrane, ear canal and external ear normal.     Mouth/Throat:     Mouth: Mucous membranes are moist.  Cardiovascular:     Rate and Rhythm: Normal rate and regular rhythm.     Pulses: Normal pulses.     Heart sounds: Normal heart sounds.  Pulmonary:     Effort: Pulmonary effort is normal.     Breath sounds: Normal breath sounds.  Abdominal:     General: Abdomen is protuberant. Bowel sounds are normal.     Tenderness: There is abdominal tenderness in the right lower quadrant.     BP (!) 140/96   Pulse 94   Temp (!) 97.3 F (36.3 C)   Resp 16   Ht 5\' 3"  (1.6 m)   Wt 283 lb (128.4 kg)   SpO2 97%   BMI 50.13 kg/m  Wt Readings from Last 3 Encounters:  12/25/19 283 lb (128.4 kg)  06/08/16 (!) 305 lb (138.3 kg)  01/11/16 293 lb 12.8 oz (133.3 kg)    Health Maintenance Due  Topic Date Due  . Hepatitis C Screening  Never done  . PNEUMOCOCCAL POLYSACCHARIDE VACCINE AGE 20-64 HIGH RISK  Never done  . FOOT EXAM  Never done  . OPHTHALMOLOGY EXAM   Never done  . URINE MICROALBUMIN  Never done  . COVID-19 Vaccine (1) Never done  . HIV Screening  Never done  . PAP SMEAR-Modifier  Never done  . HEMOGLOBIN A1C  06/27/2016    There are no preventive care reminders to display for this patient.   Lab Results  Component Value Date   TSH 0.79 12/28/2015   Lab Results  Component Value Date   WBC 12.1 (H) 01/01/2016   HGB 9.7 (L) 01/01/2016   HCT 29.4 (L) 01/01/2016   MCV 83.1 01/01/2016   PLT 245 01/01/2016   Lab Results  Component Value Date   NA 135 11/27/2015   K 3.7 11/27/2015   CO2 22 11/27/2015   GLUCOSE 148 (H) 11/27/2015   BUN 7 11/27/2015   CREATININE 0.47 11/27/2015   BILITOT 0.4 11/27/2015   ALKPHOS 58 11/27/2015   AST 23 11/27/2015   ALT 29 11/27/2015   PROT 7.3 11/27/2015   ALBUMIN 4.1 11/27/2015   CALCIUM 9.0 11/27/2015   ANIONGAP 9 11/27/2015   No results found for: CHOL No results found for: HDL No results found for: LDLCALC No results found for: TRIG No results found for: CHOLHDL Lab Results  Component Value Date   HGBA1C 7.2 (H) 12/28/2015       Assessment & Plan:  1. RLQ abdominal pain - POCT URINALYSIS DIP (CLINITEK) - normal.  Monitor for worsening symptoms.   2. Other headache syndrome Tension. Does not seem like migraines.  otc tylenol.  3. Dizziness  decrease trulicity to 8.11 mg weekly.   No orders of the defined types were placed in this encounter.   Orders Placed This Encounter  Procedures  . Urine Culture  . POCT URINALYSIS DIP (CLINITEK)     Follow-up: No follow-ups on file.  An After Visit Summary  was printed and given to the patient.  Rochel Brome [facility] [phone]

## 2019-12-29 LAB — URINE CULTURE

## 2019-12-30 ENCOUNTER — Other Ambulatory Visit: Payer: Self-pay | Admitting: Family Medicine

## 2019-12-30 MED ORDER — AMOXICILLIN-POT CLAVULANATE 875-125 MG PO TABS: 1.0000 | ORAL_TABLET | Freq: Two times a day (BID) | ORAL | 0 refills | Status: DC

## 2019-12-30 MED ORDER — NITROFURANTOIN MONOHYD MACRO 100 MG PO CAPS: 100.0000 mg | ORAL_CAPSULE | Freq: Two times a day (BID) | ORAL | 0 refills | Status: DC

## 2020-01-02 ENCOUNTER — Encounter: Payer: Self-pay | Admitting: Family Medicine

## 2020-03-22 ENCOUNTER — Other Ambulatory Visit: Payer: Self-pay | Admitting: Family Medicine

## 2020-03-22 MED ORDER — AMOXICILLIN-POT CLAVULANATE 875-125 MG PO TABS: 1.0000 | ORAL_TABLET | Freq: Two times a day (BID) | ORAL | 0 refills | Status: DC

## 2020-05-20 ENCOUNTER — Other Ambulatory Visit: Payer: Self-pay | Admitting: Physician Assistant

## 2020-05-20 DIAGNOSIS — D72828 Other elevated white blood cell count: Secondary | ICD-10-CM

## 2020-05-20 LAB — CBC WITH DIFFERENTIAL/PLATELET
Basophils Absolute: 0.1 10*3/uL (ref 0.0–0.2)
Basos: 1 %
EOS (ABSOLUTE): 0.4 10*3/uL (ref 0.0–0.4)
Eos: 3 %
Hematocrit: 38.1 % (ref 34.0–46.6)
Hemoglobin: 12.5 g/dL (ref 11.1–15.9)
Immature Grans (Abs): 0 10*3/uL (ref 0.0–0.1)
Immature Granulocytes: 0 %
Lymphocytes Absolute: 3.7 10*3/uL — ABNORMAL HIGH (ref 0.7–3.1)
Lymphs: 32 %
MCH: 26.8 pg (ref 26.6–33.0)
MCHC: 32.8 g/dL (ref 31.5–35.7)
MCV: 82 fL (ref 79–97)
Monocytes Absolute: 0.6 10*3/uL (ref 0.1–0.9)
Monocytes: 5 %
Neutrophils Absolute: 6.9 10*3/uL (ref 1.4–7.0)
Neutrophils: 59 %
Platelets: 330 10*3/uL (ref 150–450)
RBC: 4.67 x10E6/uL (ref 3.77–5.28)
RDW: 13.7 % (ref 11.7–15.4)
WBC: 11.7 10*3/uL — ABNORMAL HIGH (ref 3.4–10.8)

## 2020-05-22 DIAGNOSIS — J45909 Unspecified asthma, uncomplicated: Secondary | ICD-10-CM | POA: Insufficient documentation

## 2020-06-02 DIAGNOSIS — Z9884 Bariatric surgery status: Secondary | ICD-10-CM | POA: Insufficient documentation

## 2020-06-02 HISTORY — PX: GASTRIC BYPASS: SHX52

## 2020-06-02 HISTORY — DX: Morbid (severe) obesity due to excess calories: E66.01

## 2020-06-02 HISTORY — DX: Bariatric surgery status: Z98.84

## 2020-07-09 LAB — HM DIABETES EYE EXAM

## 2020-09-16 ENCOUNTER — Other Ambulatory Visit: Payer: Self-pay

## 2020-09-16 DIAGNOSIS — E1165 Type 2 diabetes mellitus with hyperglycemia: Secondary | ICD-10-CM

## 2020-09-16 DIAGNOSIS — E782 Mixed hyperlipidemia: Secondary | ICD-10-CM

## 2020-09-16 DIAGNOSIS — E559 Vitamin D deficiency, unspecified: Secondary | ICD-10-CM

## 2020-09-17 LAB — COMPREHENSIVE METABOLIC PANEL
ALT: 23 IU/L (ref 0–32)
AST: 24 IU/L (ref 0–40)
Albumin/Globulin Ratio: 2.1 (ref 1.2–2.2)
Albumin: 4.4 g/dL (ref 3.8–4.8)
Alkaline Phosphatase: 75 IU/L (ref 44–121)
BUN/Creatinine Ratio: 21 (ref 9–23)
BUN: 9 mg/dL (ref 6–20)
Bilirubin Total: 0.5 mg/dL (ref 0.0–1.2)
CO2: 24 mmol/L (ref 20–29)
Calcium: 9.2 mg/dL (ref 8.7–10.2)
Chloride: 105 mmol/L (ref 96–106)
Creatinine, Ser: 0.43 mg/dL — ABNORMAL LOW (ref 0.57–1.00)
Globulin, Total: 2.1 g/dL (ref 1.5–4.5)
Glucose: 88 mg/dL (ref 65–99)
Potassium: 3.8 mmol/L (ref 3.5–5.2)
Sodium: 145 mmol/L — ABNORMAL HIGH (ref 134–144)
Total Protein: 6.5 g/dL (ref 6.0–8.5)
eGFR: 133 mL/min/{1.73_m2} (ref >59)

## 2020-09-17 LAB — CBC WITH DIFFERENTIAL/PLATELET
Basophils Absolute: 0 10*3/uL (ref 0.0–0.2)
Basos: 0 %
EOS (ABSOLUTE): 0.2 10*3/uL (ref 0.0–0.4)
Eos: 2 %
Hematocrit: 38.1 % (ref 34.0–46.6)
Hemoglobin: 12.3 g/dL (ref 11.1–15.9)
Immature Grans (Abs): 0 10*3/uL (ref 0.0–0.1)
Immature Granulocytes: 0 %
Lymphocytes Absolute: 4.8 10*3/uL — ABNORMAL HIGH (ref 0.7–3.1)
Lymphs: 44 %
MCH: 26.3 pg — ABNORMAL LOW (ref 26.6–33.0)
MCHC: 32.3 g/dL (ref 31.5–35.7)
MCV: 81 fL (ref 79–97)
Monocytes Absolute: 0.8 10*3/uL (ref 0.1–0.9)
Monocytes: 7 %
Neutrophils Absolute: 5.1 10*3/uL (ref 1.4–7.0)
Neutrophils: 47 %
Platelets: 346 10*3/uL (ref 150–450)
RBC: 4.68 x10E6/uL (ref 3.77–5.28)
RDW: 16.1 % — ABNORMAL HIGH (ref 11.7–15.4)
WBC: 10.9 10*3/uL — ABNORMAL HIGH (ref 3.4–10.8)

## 2020-09-17 LAB — LIPID PANEL
Chol/HDL Ratio: 3.8 ratio (ref 0.0–4.4)
Cholesterol, Total: 180 mg/dL (ref 100–199)
HDL: 47 mg/dL (ref >39)
LDL Chol Calc (NIH): 117 mg/dL — ABNORMAL HIGH (ref 0–99)
Triglycerides: 86 mg/dL (ref 0–149)
VLDL Cholesterol Cal: 16 mg/dL (ref 5–40)

## 2020-09-17 LAB — MICROALBUMIN / CREATININE URINE RATIO
Creatinine, Urine: 348.1 mg/dL
Microalb/Creat Ratio: 13 mg/g creat (ref 0–29)
Microalbumin, Urine: 46.7 ug/mL

## 2020-09-17 LAB — HEMOGLOBIN A1C
Est. average glucose Bld gHb Est-mCnc: 108 mg/dL
Hgb A1c MFr Bld: 5.4 % (ref 4.8–5.6)

## 2020-09-17 LAB — CARDIOVASCULAR RISK ASSESSMENT

## 2020-09-17 LAB — VITAMIN D 25 HYDROXY (VIT D DEFICIENCY, FRACTURES): Vit D, 25-Hydroxy: 10.8 ng/mL — ABNORMAL LOW (ref 30.0–100.0)

## 2020-09-23 ENCOUNTER — Other Ambulatory Visit: Payer: Self-pay

## 2020-09-23 DIAGNOSIS — F418 Other specified anxiety disorders: Secondary | ICD-10-CM | POA: Insufficient documentation

## 2020-09-23 HISTORY — DX: Other specified anxiety disorders: F41.8

## 2020-09-29 ENCOUNTER — Other Ambulatory Visit: Payer: Self-pay | Admitting: Nurse Practitioner

## 2020-09-29 ENCOUNTER — Ambulatory Visit: Payer: No Typology Code available for payment source | Admitting: Physician Assistant

## 2020-09-29 DIAGNOSIS — R112 Nausea with vomiting, unspecified: Secondary | ICD-10-CM

## 2020-09-29 MED ORDER — ONDANSETRON 4 MG PO TBDP: 4.0000 mg | ORAL_TABLET | Freq: Three times a day (TID) | ORAL | 1 refills | Status: DC | PRN

## 2020-09-30 ENCOUNTER — Encounter: Payer: Self-pay | Admitting: Family Medicine

## 2020-09-30 ENCOUNTER — Ambulatory Visit: Payer: Medicaid Other | Admitting: Physician Assistant

## 2020-09-30 ENCOUNTER — Other Ambulatory Visit: Payer: Self-pay

## 2020-09-30 ENCOUNTER — Encounter: Payer: Self-pay | Admitting: Physician Assistant

## 2020-09-30 VITALS — BP 112/70 | HR 72 | Temp 97.3°F | Resp 15 | Ht 63.0 in | Wt 223.0 lb

## 2020-09-30 DIAGNOSIS — R634 Abnormal weight loss: Secondary | ICD-10-CM

## 2020-09-30 DIAGNOSIS — E782 Mixed hyperlipidemia: Secondary | ICD-10-CM

## 2020-09-30 DIAGNOSIS — E559 Vitamin D deficiency, unspecified: Secondary | ICD-10-CM | POA: Insufficient documentation

## 2020-09-30 DIAGNOSIS — F418 Other specified anxiety disorders: Secondary | ICD-10-CM | POA: Diagnosis not present

## 2020-09-30 HISTORY — DX: Mixed hyperlipidemia: E78.2

## 2020-09-30 HISTORY — DX: Vitamin D deficiency, unspecified: E55.9

## 2020-09-30 HISTORY — DX: Abnormal weight loss: R63.4

## 2020-09-30 MED ORDER — VITAMIN D (ERGOCALCIFEROL) 1.25 MG (50000 UNIT) PO CAPS: ORAL_CAPSULE | ORAL | 1 refills | Status: DC

## 2020-09-30 MED ORDER — LORAZEPAM 1 MG PO TABS: 1.0000 mg | ORAL_TABLET | Freq: Every day | ORAL | 1 refills | Status: DC | PRN

## 2020-09-30 NOTE — Progress Notes (Signed)
Subjective:  Patient ID: Michelle Lamb, female    DOB: 03/23/1990  Age: 30 y.o. MRN: [id number]  Chief Complaint  Patient presents with   Vit D def    HPI  Pt with history of low vitamin D - currently she is on ergocalciferol 50000units weekly however recent labwork shows level low at 10.8 -- recommend to increase to twice weekly and will recheck in one month  Pt with history of hyperlipidemia - last labwork 2 weeks ago showed normal total chol and triglycerides however LDL elevated at 117 --- currently pt not on medication and is watching her diet  Pt with history of diabetes diagnosed in 2015 - she has since had gastric bypass surgery on April 5th and has not been on glucophage or trulicity since that time - recent labwork shows glucose at 88 and hgb a1c normal at 5.4- recommend to stay off medication at this time  Pt with history of anxiety with depression - currently on lexapro '5mg'$  qd and ativan '1mg'$  prn - states those medications working well for her  Pt with history of seasonal asthma - uses ventolin and symbicort as needed  Pt with history of intermittent left leg pain - she occasionally takes neurontin '100mg'$  at night for this Michelle does not take regularly due to how medication makes her feel next morning  Recent CBC and CMP were done which were normal Current Outpatient Medications on File Prior to Visit  Medication Sig Dispense Refill   albuterol (VENTOLIN HFA) 108 (90 Base) MCG/ACT inhaler Inhale 2 puffs into Michelle lungs every 6 (six) hours as needed.     budesonide-formoterol (SYMBICORT) 80-4.5 MCG/ACT inhaler Inhale 2 puffs into Michelle lungs 2 (two) times daily.     escitalopram (LEXAPRO) 5 MG tablet Take 5 mg by mouth daily.     gabapentin (NEURONTIN) 100 MG capsule TAKE 1 CAPSULE BY MOUTH THREE TIMES DAILY AS NEEDED     glucose blood (ACCU-CHEK GUIDE) test strip USE 1 STRIP TO CHECK GLUCOSE THREE TIMES DAILY     lactulose (CHRONULAC) 10 GM/15ML solution SMARTSIG:15 Milliliter(s)  By Mouth Every 6 Hours PRN     metroNIDAZOLE (FLAGYL) 500 MG tablet Take 500 mg by mouth 2 (two) times daily.     omeprazole (PRILOSEC) 20 MG capsule Take 20 mg by mouth daily.     ondansetron (ZOFRAN-ODT) 4 MG disintegrating tablet Take 1 tablet (4 mg total) by mouth every 8 (eight) hours as needed for nausea or vomiting. 30 tablet 1   promethazine (PHENERGAN) 25 MG tablet Take by mouth.     No current facility-administered medications on file prior to visit.   Past Medical History:  Diagnosis Date   Benign essential HTN    Diabetes mellitus without complication ([place])    TYPE 2 LAST DOSE FRIDAY OCTOBER 27 (METFORMIN)   History of herpes simplex infection    Hyperlipidemia    Vaginal delivery 2009, 2010, 2015   Past Surgical History:  Procedure Laterality Date   CESAREAN SECTION  09/29/2016   CHOLECYSTECTOMY     DILATION AND CURETTAGE OF UTERUS N/A 12/31/2015   Procedure: SUCTION DILATATION AND CURETTAGE;  Surgeon: Aletha Halim, MD;  Location: [facility];  Service: Gynecology;  Laterality: N/A;   DILATION AND EVACUATION N/A 01/01/2016   Procedure: DILATATION AND EVACUATION;  Surgeon: Jonnie Kind, MD;  Location: [facility];  Service: Gynecology;  Laterality: N/A;   ESOPHAGOGASTRODUODENOSCOPY     GASTRIC BYPASS  06/02/2020   TONSILLECTOMY  Family History  Problem Relation Age of Onset   Hypertension Mother    Cancer Maternal Grandmother        stomach cancer   Depression Maternal Grandfather    Cancer Maternal Grandfather        leukemia   Depression Paternal Grandmother    Social History   Socioeconomic History   Marital status: Married    Spouse name: Not on file   Number of children: 4   Years of education: Not on file   Highest education level: Not on file  Occupational History   Occupation: Physiological scientist  Tobacco Use   Smoking status: Never   Smokeless tobacco: Never  Substance and Sexual Activity   Alcohol use: Yes    Comment: OCCASIONAL    Drug use: No   Sexual activity: Yes    Birth control/protection: None    Comment: Nexplanon removed early 2017  Other Topics Concern   Not on file  Social History Narrative   Not on file   Social Determinants of Health   Financial Resource Strain: Not on file  Food Insecurity: Not on file  Transportation Needs: Not on file  Physical Activity: Not on file  Stress: Not on file  Social Connections: Not on file    Review of Systems  CONSTITUTIONAL: Negative for chills, fatigue, fever, unintentional weight gain and unintentional weight loss.  E/N/T: Negative for ear pain, nasal congestion and sore throat.  CARDIOVASCULAR: Negative for chest pain, dizziness, palpitations and pedal edema.  RESPIRATORY: Negative for recent cough and dyspnea.  GASTROINTESTINAL: Negative for abdominal pain, acid reflux symptoms, constipation, diarrhea, nausea and vomiting.  MSK: see HPI INTEGUMENTARY: Negative for rash.  PSYCHIATRIC: Negative for sleep disturbance and to question depression screen.  Negative for depression, negative for anhedonia.      Objective:  PHYSICAL EXAM:   VS: BP 112/70   Pulse 72   Temp (!) 97.3 F (36.3 C)   Resp 15   Ht '5\' 3"'$  (1.6 m)   Wt 223 lb (101.2 kg)   LMP 08/30/2020 (Approximate)   SpO2 98%   BMI 39.50 kg/m   GEN: Well nourished, well developed, in no acute distress  Cardiac: RRR; no murmurs, rubs, or gallops, Respiratory:  normal respiratory rate and pattern with no distress - normal breath sounds with no rales, rhonchi, wheezes or rubs Skin: warm and dry, no rash  Psych: euthymic mood, appropriate affect and demeanor  Diabetic Foot Exam - Simple   No data filed      Lab Results  Component Value Date   WBC 10.9 (H) 09/16/2020   HGB 12.3 09/16/2020   HCT 38.1 09/16/2020   PLT 346 09/16/2020   GLUCOSE 88 09/16/2020   CHOL 180 09/16/2020   TRIG 86 09/16/2020   HDL 47 09/16/2020   LDLCALC 117 (H) 09/16/2020   ALT 23 09/16/2020   AST 24  09/16/2020   NA 145 (H) 09/16/2020   K 3.8 09/16/2020   CL 105 09/16/2020   CREATININE 0.43 (L) 09/16/2020   BUN 9 09/16/2020   CO2 24 09/16/2020   TSH 0.79 12/28/2015   HGBA1C 5.4 09/16/2020      Assessment & Plan:   1. Vitamin D insufficiency - VITAMIN D 25 Hydroxy (Vit-D Deficiency, Fractures); Future - Vitamin D, Ergocalciferol, (DRISDOL) 1.25 MG (50000 UNIT) CAPS capsule; One pill twice weekly  Dispense: 24 capsule; Refill: 1  2. Weight loss - TSH; Future Continue to watch diet and follow up  with specialist as directed 3. Anxiety with depression - LORazepam (ATIVAN) 1 MG tablet; Take 1 tablet (1 mg total) by mouth daily as needed.  Dispense: 30 tablet; Refill: 1 Continue other meds 4. Mixed hyperlipidemia  Watch diet  Meds ordered this encounter  Medications   LORazepam (ATIVAN) 1 MG tablet    Sig: Take 1 tablet (1 mg total) by mouth daily as needed.    Dispense:  30 tablet    Refill:  1    Order Specific Question:   Supervising Provider    Answer:   Shelton Silvas   Vitamin D, Ergocalciferol, (DRISDOL) 1.25 MG (50000 UNIT) CAPS capsule    Sig: One pill twice weekly    Dispense:  24 capsule    Refill:  1    Order Specific Question:   Supervising Provider    AnswerShelton Silvas    Orders Placed This Encounter  Procedures   TSH   VITAMIN D 25 Hydroxy (Vit-D Deficiency, Fractures)     Follow-up: Return in about 6 months (around 04/02/2021) for chronic fasting and 1 month for labwork (nurse).  An After Visit Summary was printed and given to Michelle patient.  Yetta Flock [facility] [phone]

## 2020-10-01 ENCOUNTER — Ambulatory Visit: Payer: Medicaid Other | Admitting: Physician Assistant

## 2020-10-05 ENCOUNTER — Encounter: Payer: Self-pay | Admitting: Nurse Practitioner

## 2020-10-05 ENCOUNTER — Ambulatory Visit: Payer: Medicaid Other | Admitting: Nurse Practitioner

## 2020-10-05 ENCOUNTER — Other Ambulatory Visit: Payer: Self-pay

## 2020-10-05 VITALS — BP 110/70 | HR 88 | Temp 97.0°F | Ht 63.0 in | Wt 223.0 lb

## 2020-10-05 DIAGNOSIS — N3001 Acute cystitis with hematuria: Secondary | ICD-10-CM

## 2020-10-05 DIAGNOSIS — R1032 Left lower quadrant pain: Secondary | ICD-10-CM | POA: Diagnosis not present

## 2020-10-05 DIAGNOSIS — R1031 Right lower quadrant pain: Secondary | ICD-10-CM

## 2020-10-05 DIAGNOSIS — Z8619 Personal history of other infectious and parasitic diseases: Secondary | ICD-10-CM | POA: Diagnosis not present

## 2020-10-05 LAB — POCT URINALYSIS DIPSTICK
Glucose, UA: NEGATIVE
Nitrite, UA: POSITIVE
Protein, UA: POSITIVE — AB
Spec Grav, UA: >=1.03 — AB (ref 1.010–1.025)
Urobilinogen, UA: 1 E.U./dL
pH, UA: 5.5 (ref 5.0–8.0)

## 2020-10-05 MED ORDER — SULFAMETHOXAZOLE-TRIMETHOPRIM 800-160 MG PO TABS: 1.0000 | ORAL_TABLET | Freq: Two times a day (BID) | ORAL | 0 refills | Status: DC

## 2020-10-05 MED ORDER — FLUCONAZOLE 150 MG PO TABS
150.0000 mg | ORAL_TABLET | Freq: Once | ORAL | 0 refills | Status: AC
Start: 2020-10-05 — End: 2020-10-05

## 2020-10-05 NOTE — Patient Instructions (Addendum)
Rest and push fluids Take Bactrim twice daily for 10 days We will call you with CT abd and pelvis appointment  Urinary Tract Infection, Adult A urinary tract infection (UTI) is an infection of any part of the urinary tract. The urinary tract includes: The kidneys. The ureters. The bladder. The urethra. These organs make, store, and get rid of pee (urine) in the body. What are the causes? This infection is caused by germs (bacteria) in your genital area. These germs grow and cause swelling (inflammation) of your urinary tract. What increases the risk? The following factors may make you more likely to develop this condition: Using a small, thin tube (catheter) to drain pee. Not being able to control when you pee or poop (incontinence). Being female. If you are female, these things can increase the risk: Using these methods to prevent pregnancy: A medicine that kills sperm (spermicide). A device that blocks sperm (diaphragm). Having low levels of a female hormone (estrogen). Being pregnant. You are more likely to develop this condition if: You have genes that add to your risk. You are sexually active. You take antibiotic medicines. You have trouble peeing because of: A prostate that is bigger than normal, if you are female. A blockage in the part of your body that drains pee from the bladder. A kidney stone. A nerve condition that affects your bladder. Not getting enough to drink. Not peeing often enough. You have other conditions, such as: Diabetes. A weak disease-fighting system (immune system). Sickle cell disease. Gout. Injury of the spine. What are the signs or symptoms? Symptoms of this condition include: Needing to pee right away. Peeing small amounts often. Pain or burning when peeing. Blood in the pee. Pee that smells bad or not like normal. Trouble peeing. Pee that is cloudy. Fluid coming from the vagina, if you are female. Pain in the belly or lower  back. Other symptoms include: Vomiting. Not feeling hungry. Feeling mixed up (confused). This may be the first symptom in older adults. Being tired and grouchy (irritable). A fever. Watery poop (diarrhea). How is this treated? Taking antibiotic medicine. Taking other medicines. Drinking enough water. In some cases, you may need to see a specialist. Follow these instructions at home:  Medicines Take over-the-counter and prescription medicines only as told by your doctor. If you were prescribed an antibiotic medicine, take it as told by your doctor. Do not stop taking it even if you start to feel better. General instructions Make sure you: Pee until your bladder is empty. Do not hold pee for a long time. Empty your bladder after sex. Wipe from front to back after peeing or pooping if you are a female. Use each tissue one time when you wipe. Drink enough fluid to keep your pee pale yellow. Keep all follow-up visits. Contact a doctor if: You do not get better after 1-2 days. Your symptoms go away and then come back. Get help right away if: You have very bad back pain. You have very bad pain in your lower belly. You have a fever. You have chills. You feeling like you will vomit or you vomit. Summary A urinary tract infection (UTI) is an infection of any part of the urinary tract. This condition is caused by germs in your genital area. There are many risk factors for a UTI. Treatment includes antibiotic medicines. Drink enough fluid to keep your pee pale yellow. This information is not intended to replace advice given to you by your health care provider. Make  sure you discuss any questions you have with your healthcare provider. Document Revised: 09/27/2019 Document Reviewed: 09/27/2019 Elsevier Patient Education  [place].

## 2020-10-05 NOTE — Progress Notes (Signed)
Acute Office Visit  Subjective:    Patient ID: Michelle Lamb, female    DOB: 15-Feb-1991, 29 y.o.   MRN: [id number]  Chief Complaint  Patient presents with   Lower abdominal pain    HPI Patient is in today for RLQ pain, nausea, and vomiting for one-week. Treatment includes Tylenol. She had gastric bypass April 5th, 2022.    Abdominal Pain  She reports new onset abdominal pain. The most recent episode started about a week ago and is rapidly worsening. The abdominal pain is located in the right lower quadrant with radiation to the periumbilical region. It is described as aching, is 8/10 in intensity, occurring constantly. It is aggravated by  sitting  and is relieved by nothing. She has tried Tylenol to try to relieve pain with no relief.  Associated symptoms: No anorexia  Yes belching  No bloody stool Yes blood in urine   Yes constipation Yes diarrhea  No dysuria No fever  Yes flatus Yes headaches  Yes headaches No joint pains  No myalgias Yes nausea  Yes vomiting Yes weight loss-intentional     Recent GI studies:upper GI endoscopy Relevant medical history includes: had esophageal dilation after gastric bypass due to difficulty swallowing  Previous labs Lab Results  Component Value Date   WBC 10.9 (H) 09/16/2020   HGB 12.3 09/16/2020   HCT 38.1 09/16/2020   MCV 81 09/16/2020   MCH 26.3 (L) 09/16/2020   RDW 16.1 (H) 09/16/2020   PLT 346 09/16/2020   Lab Results  Component Value Date   GLUCOSE 88 09/16/2020   NA 145 (H) 09/16/2020   K 3.8 09/16/2020   CL 105 09/16/2020   CO2 24 09/16/2020   BUN 9 09/16/2020   CREATININE 0.43 (L) 09/16/2020   GFRNONAA >60 11/27/2015   GFRAA >60 11/27/2015   CALCIUM 9.2 09/16/2020   PROT 6.5 09/16/2020   ALBUMIN 4.4 09/16/2020   LABGLOB 2.1 09/16/2020   AGRATIO 2.1 09/16/2020   BILITOT 0.5 09/16/2020   ALKPHOS 75 09/16/2020   AST 24 09/16/2020   ALT 23 09/16/2020   ANIONGAP 9 11/27/2015     Past Medical History:   Diagnosis Date   Benign essential HTN    Diabetes mellitus without complication ([place])    TYPE 2 LAST DOSE FRIDAY OCTOBER 27 (METFORMIN)   History of herpes simplex infection    Hyperlipidemia    Vaginal delivery 2009, 2010, 2015    Past Surgical History:  Procedure Laterality Date   CESAREAN SECTION  09/29/2016   CHOLECYSTECTOMY     DILATION AND CURETTAGE OF UTERUS N/A 12/31/2015   Procedure: SUCTION DILATATION AND CURETTAGE;  Surgeon: Aletha Halim, MD;  Location: [facility];  Service: Gynecology;  Laterality: N/A;   DILATION AND EVACUATION N/A 01/01/2016   Procedure: DILATATION AND EVACUATION;  Surgeon: Jonnie Kind, MD;  Location: [facility];  Service: Gynecology;  Laterality: N/A;   ESOPHAGOGASTRODUODENOSCOPY     GASTRIC BYPASS  06/02/2020   TONSILLECTOMY      Family History  Problem Relation Age of Onset   Hypertension Mother    Cancer Maternal Grandmother        stomach cancer   Depression Maternal Grandfather    Cancer Maternal Grandfather        leukemia   Depression Paternal Grandmother     Social History   Socioeconomic History   Marital status: Married    Spouse name: Not on file   Number of children: 4   Years  of education: Not on file   Highest education level: Not on file  Occupational History   Occupation: Physiological scientist  Tobacco Use   Smoking status: Never   Smokeless tobacco: Never  Substance and Sexual Activity   Alcohol use: Yes    Comment: OCCASIONAL   Drug use: No   Sexual activity: Yes    Birth control/protection: None    Comment: Nexplanon removed early 2017  Other Topics Concern   Not on file  Social History Narrative   Not on file   Social Determinants of Health   Financial Resource Strain: Not on file  Food Insecurity: Not on file  Transportation Needs: Not on file  Physical Activity: Not on file  Stress: Not on file  Social Connections: Not on file  Intimate Partner Violence: Not on file    Outpatient  Medications Prior to Visit  Medication Sig Dispense Refill   albuterol (VENTOLIN HFA) 108 (90 Base) MCG/ACT inhaler Inhale 2 puffs into the lungs every 6 (six) hours as needed.     budesonide-formoterol (SYMBICORT) 80-4.5 MCG/ACT inhaler Inhale 2 puffs into the lungs 2 (two) times daily.     escitalopram (LEXAPRO) 5 MG tablet Take 5 mg by mouth daily.     gabapentin (NEURONTIN) 100 MG capsule TAKE 1 CAPSULE BY MOUTH THREE TIMES DAILY AS NEEDED     glucose blood (ACCU-CHEK GUIDE) test strip USE 1 STRIP TO CHECK GLUCOSE THREE TIMES DAILY     lactulose (CHRONULAC) 10 GM/15ML solution SMARTSIG:15 Milliliter(s) By Mouth Every 6 Hours PRN     LORazepam (ATIVAN) 1 MG tablet Take 1 tablet (1 mg total) by mouth daily as needed. 30 tablet 1   metroNIDAZOLE (FLAGYL) 500 MG tablet Take 500 mg by mouth 2 (two) times daily.     omeprazole (PRILOSEC) 20 MG capsule Take 20 mg by mouth daily.     ondansetron (ZOFRAN-ODT) 4 MG disintegrating tablet Take 1 tablet (4 mg total) by mouth every 8 (eight) hours as needed for nausea or vomiting. 30 tablet 1   promethazine (PHENERGAN) 25 MG tablet Take by mouth.     Vitamin D, Ergocalciferol, (DRISDOL) 1.25 MG (50000 UNIT) CAPS capsule One pill twice weekly 24 capsule 1   No facility-administered medications prior to visit.    No Known Allergies  Review of Systems  Constitutional:  Positive for appetite change (decreased) and fatigue. Negative for fever.  HENT:  Negative for congestion, ear pain, sinus pressure and sore throat.   Eyes:  Negative for pain.  Respiratory:  Negative for cough, chest tightness, shortness of breath and wheezing.   Cardiovascular:  Negative for chest pain and palpitations.  Gastrointestinal:  Positive for abdominal pain (Lower abdominal pain), constipation, diarrhea, nausea and vomiting.  Endocrine: Negative.   Genitourinary:  Positive for frequency and hematuria. Negative for dysuria.  Musculoskeletal:  Negative for arthralgias, back  pain, joint swelling and myalgias.  Skin:  Negative for rash.  Allergic/Immunologic: Negative.   Neurological:  Positive for headaches. Negative for dizziness and weakness.  Hematological: Negative.   Psychiatric/Behavioral:  Negative for dysphoric mood. The patient is not nervous/anxious.       Objective:    Physical Exam Vitals reviewed.  Constitutional:      Appearance: Normal appearance.  Cardiovascular:     Rate and Rhythm: Normal rate and regular rhythm.     Pulses: Normal pulses.     Heart sounds: Normal heart sounds.  Pulmonary:     Effort: Pulmonary effort  is normal.     Breath sounds: Normal breath sounds.  Abdominal:     General: Bowel sounds are normal.     Palpations: Abdomen is soft.     Tenderness: There is abdominal tenderness. There is right CVA tenderness, left CVA tenderness, guarding and rebound.  Musculoskeletal:        General: Normal range of motion.  Skin:    General: Skin is warm and dry.     Capillary Refill: Capillary refill takes less than 2 seconds.  Neurological:     General: No focal deficit present.     Mental Status: She is alert and oriented to person, place, and time.  Psychiatric:        Mood and Affect: Mood normal.        Behavior: Behavior normal.    BP 110/70 (BP Location: Left Arm, Patient Position: Sitting)   Pulse 88   Temp (!) 97 F (36.1 C) (Temporal)   Ht _0  (1.6 m)   Wt 223 lb (101.2 kg)   SpO2 98%   BMI 39.50 kg/m  Wt Readings from Last 3 Encounters:  10/05/20 223 lb (101.2 kg)  09/30/20 223 lb (101.2 kg)  12/25/19 283 lb (128.4 kg)    Health Maintenance Due  Topic Date Due   INFLUENZA VACCINE  09/28/2020       Lab Results  Component Value Date   TSH 0.79 12/28/2015   Lab Results  Component Value Date   WBC 10.9 (H) 09/16/2020   HGB 12.3 09/16/2020   HCT 38.1 09/16/2020   MCV 81 09/16/2020   PLT 346 09/16/2020   Lab Results  Component Value Date   NA 145 (H) 09/16/2020   K 3.8 09/16/2020    CO2 24 09/16/2020   GLUCOSE 88 09/16/2020   BUN 9 09/16/2020   CREATININE 0.43 (L) 09/16/2020   BILITOT 0.5 09/16/2020   ALKPHOS 75 09/16/2020   AST 24 09/16/2020   ALT 23 09/16/2020   PROT 6.5 09/16/2020   ALBUMIN 4.4 09/16/2020   CALCIUM 9.2 09/16/2020   ANIONGAP 9 11/27/2015   EGFR 133 09/16/2020   Lab Results  Component Value Date   CHOL 180 09/16/2020   Lab Results  Component Value Date   HDL 47 09/16/2020   Lab Results  Component Value Date   LDLCALC 117 (H) 09/16/2020   Lab Results  Component Value Date   TRIG 86 09/16/2020   Lab Results  Component Value Date   CHOLHDL 3.8 09/16/2020   Lab Results  Component Value Date   HGBA1C 5.4 09/16/2020         Assessment & Plan:    1. Acute cystitis with hematuria - Urine Culture - sulfamethoxazole-trimethoprim (BACTRIM DS) 800-160 MG tablet; Take 1 tablet by mouth 2 (two) times daily.  Dispense: 20 tablet; Refill: 0  2. Acute bilateral lower abdominal pain - POCT urinalysis dipstick - CT ABDOMEN PELVIS W WO CONTRAST  3. RLQ abdominal pain - CT ABDOMEN PELVIS W WO CONTRAST  4. History of candidiasis of vagina - fluconazole (DIFLUCAN) 150 MG tablet; Take 1 tablet (150 mg total) by mouth once for 1 dose.  Dispense: 1 tablet; Refill: 0     Rest and push fluids Take Bactrim twice daily for 10 days We will call you with CT abd and pelvis appointment  I,Lauren M Auman,acting as a scribe for [employer], NP.,have documented all relevant documentation on the behalf of Rip Harbour, NP,as directed by  Rip Harbour, NP while in the presence of Rip Harbour, NP.    I, Rip Harbour, NP, have reviewed all documentation for this visit. The documentation on 10/05/20 for the exam, diagnosis, procedures, and orders are all accurate and complete.

## 2020-10-07 LAB — URINE CULTURE

## 2020-10-23 ENCOUNTER — Encounter: Payer: Self-pay | Admitting: Physician Assistant

## 2020-10-28 ENCOUNTER — Other Ambulatory Visit: Payer: Medicaid Other

## 2020-11-04 ENCOUNTER — Other Ambulatory Visit: Payer: Self-pay | Admitting: Nurse Practitioner

## 2020-11-04 DIAGNOSIS — K769 Liver disease, unspecified: Secondary | ICD-10-CM

## 2020-11-09 ENCOUNTER — Ambulatory Visit (INDEPENDENT_AMBULATORY_CARE_PROVIDER_SITE_OTHER): Payer: Medicaid Other | Admitting: Physician Assistant

## 2020-11-09 ENCOUNTER — Encounter: Payer: Self-pay | Admitting: Physician Assistant

## 2020-11-09 VITALS — BP 118/80 | HR 69 | Temp 97.0°F | Ht 63.0 in | Wt 213.0 lb

## 2020-11-09 DIAGNOSIS — N3001 Acute cystitis with hematuria: Secondary | ICD-10-CM

## 2020-11-09 LAB — POCT URINALYSIS DIP (CLINITEK)
Glucose, UA: NEGATIVE mg/dL
Ketones, POC UA: NEGATIVE mg/dL
Nitrite, UA: NEGATIVE
Spec Grav, UA: >=1.03 — AB (ref 1.010–1.025)
Urobilinogen, UA: 2 E.U./dL — AB
pH, UA: 6 (ref 5.0–8.0)

## 2020-11-09 MED ORDER — CIPROFLOXACIN HCL 500 MG PO TABS: 500.0000 mg | ORAL_TABLET | Freq: Two times a day (BID) | ORAL | 0 refills | Status: AC

## 2020-11-09 NOTE — Progress Notes (Signed)
Acute Office Visit  Subjective:    Patient ID: Michelle Lamb, female    DOB: [birth year], 30 y.o.   MRN: [id number]  Chief Complaint  Patient presents with   Urinary Tract Infection    HPI Patient is in today for uti symptoms - states she began having some low back pain and discomfort today - no hematuria Pt does have known kidney stone on right side Denies nausea, vomiting , fever  Past Medical History:  Diagnosis Date   Benign essential HTN    Diabetes mellitus without complication ([place])    TYPE 2 LAST DOSE FRIDAY OCTOBER 27 (METFORMIN)   History of herpes simplex infection    Hyperlipidemia    Vaginal delivery 2009, 2010, 2015    Past Surgical History:  Procedure Laterality Date   CESAREAN SECTION  09/29/2016   CHOLECYSTECTOMY     DILATION AND CURETTAGE OF UTERUS N/A 12/31/2015   Procedure: SUCTION DILATATION AND CURETTAGE;  Surgeon: Aletha Halim, MD;  Location: [facility];  Service: Gynecology;  Laterality: N/A;   DILATION AND EVACUATION N/A 01/01/2016   Procedure: DILATATION AND EVACUATION;  Surgeon: Jonnie Kind, MD;  Location: [facility];  Service: Gynecology;  Laterality: N/A;   ESOPHAGOGASTRODUODENOSCOPY     GASTRIC BYPASS  06/02/2020   TONSILLECTOMY      Family History  Problem Relation Age of Onset   Hypertension Mother    Cancer Maternal Grandmother        stomach cancer   Depression Maternal Grandfather    Cancer Maternal Grandfather        leukemia   Depression Paternal Grandmother     Social History   Socioeconomic History   Marital status: Married    Spouse name: Not on file   Number of children: 4   Years of education: Not on file   Highest education level: Not on file  Occupational History   Occupation: Physiological scientist  Tobacco Use   Smoking status: Never   Smokeless tobacco: Never  Substance and Sexual Activity   Alcohol use: Yes    Comment: OCCASIONAL   Drug use: No   Sexual activity: Yes    Birth control/protection:  None    Comment: Nexplanon removed early 2017  Other Topics Concern   Not on file  Social History Narrative   Not on file   Social Determinants of Health   Financial Resource Strain: Not on file  Food Insecurity: Not on file  Transportation Needs: Not on file  Physical Activity: Not on file  Stress: Not on file  Social Connections: Not on file  Intimate Partner Violence: Not on file    Outpatient Medications Prior to Visit  Medication Sig Dispense Refill   albuterol (VENTOLIN HFA) 108 (90 Base) MCG/ACT inhaler Inhale 2 puffs into the lungs every 6 (six) hours as needed.     budesonide-formoterol (SYMBICORT) 80-4.5 MCG/ACT inhaler Inhale 2 puffs into the lungs 2 (two) times daily.     escitalopram (LEXAPRO) 5 MG tablet Take 5 mg by mouth daily.     gabapentin (NEURONTIN) 100 MG capsule TAKE 1 CAPSULE BY MOUTH THREE TIMES DAILY AS NEEDED     glucose blood (ACCU-CHEK GUIDE) test strip USE 1 STRIP TO CHECK GLUCOSE THREE TIMES DAILY     lactulose (CHRONULAC) 10 GM/15ML solution SMARTSIG:15 Milliliter(s) By Mouth Every 6 Hours PRN     LORazepam (ATIVAN) 1 MG tablet Take 1 tablet (1 mg total) by mouth daily as needed. 30 tablet 1  metroNIDAZOLE (FLAGYL) 500 MG tablet Take 500 mg by mouth 2 (two) times daily.     omeprazole (PRILOSEC) 20 MG capsule Take 20 mg by mouth daily.     ondansetron (ZOFRAN-ODT) 4 MG disintegrating tablet Take 1 tablet (4 mg total) by mouth every 8 (eight) hours as needed for nausea or vomiting. 30 tablet 1   promethazine (PHENERGAN) 25 MG tablet Take by mouth.     sulfamethoxazole-trimethoprim (BACTRIM DS) 800-160 MG tablet Take 1 tablet by mouth 2 (two) times daily. 20 tablet 0   Vitamin D, Ergocalciferol, (DRISDOL) 1.25 MG (50000 UNIT) CAPS capsule One pill twice weekly 24 capsule 1   No facility-administered medications prior to visit.    No Known Allergies  Review of Systems CONSTITUTIONAL: Negative for chills, fatigue, fever, unintentional weight gain  and unintentional weight loss.  CARDIOVASCULAR: Negative for chest pain, dizziness, palpitations and pedal edema.  RESPIRATORY: Negative for recent cough and dyspnea.  GASTROINTESTINAL: Negative for abdominal pain, acid reflux symptoms, constipation, diarrhea, nausea and vomiting.  MSK:see HPI GU - see HPI      Objective:    Physical Exam  BP 118/80   Pulse 69   Temp (!) 97 F (36.1 C)   Ht $R'5\' 3"'eN$  (1.6 m)   Wt 213 lb (96.6 kg)   SpO2 99%   BMI 37.73 kg/m  Wt Readings from Last 3 Encounters:  11/09/20 213 lb (96.6 kg)  10/05/20 223 lb (101.2 kg)  09/30/20 223 lb (101.2 kg)  PHYSICAL EXAM:   VS: BP 118/80   Pulse 69   Temp (!) 97 F (36.1 C)   Ht $R'5\' 3"'ho$  (1.6 m)   Wt 213 lb (96.6 kg)   SpO2 99%   BMI 37.73 kg/m   GEN: Well nourished, well developed, in no acute distress  Cardiac: RRR; no murmurs, rubs, or gallops,no edema -  Respiratory:  normal respiratory rate and pattern with no distress - normal breath sounds with no rales, rhonchi, wheezes or rubs GI: normal bowel sounds, no masses or tenderness Mild right back side pain  Office Visit on 11/09/2020  Component Date Value Ref Range Status   Color, UA 11/09/2020 brown (A) yellow Final   Clarity, UA 11/09/2020 cloudy (A) clear Final   Glucose, UA 11/09/2020 negative  negative mg/dL Final   Bilirubin, UA 11/09/2020 small (A) negative Final   Ketones, POC UA 11/09/2020 negative  negative mg/dL Final   Spec Grav, UA 11/09/2020 >=1.030 (A) 1.010 - 1.025 Final   Blood, UA 11/09/2020 moderate (A) negative Final   pH, UA 11/09/2020 6.0  5.0 - 8.0 Final   POC PROTEIN,UA 11/09/2020 trace  negative, trace Final   Urobilinogen, UA 11/09/2020 2.0 (A) 0.2 or 1.0 E.U./dL Final   Nitrite, UA 11/09/2020 Negative  Negative Final   Leukocytes, UA 11/09/2020 Large (3+) (A) Negative Final    Health Maintenance Due  Topic Date Due   COVID-19 Vaccine (3 - Booster for Pfizer series) 09/09/2019   INFLUENZA VACCINE  09/28/2020     There are no preventive care reminders to display for this patient.   Lab Results  Component Value Date   TSH 0.79 12/28/2015   Lab Results  Component Value Date   WBC 10.9 (H) 09/16/2020   HGB 12.3 09/16/2020   HCT 38.1 09/16/2020   MCV 81 09/16/2020   PLT 346 09/16/2020   Lab Results  Component Value Date   NA 145 (H) 09/16/2020   K 3.8 09/16/2020   CO2  24 09/16/2020   GLUCOSE 88 09/16/2020   BUN 9 09/16/2020   CREATININE 0.43 (L) 09/16/2020   BILITOT 0.5 09/16/2020   ALKPHOS 75 09/16/2020   AST 24 09/16/2020   ALT 23 09/16/2020   PROT 6.5 09/16/2020   ALBUMIN 4.4 09/16/2020   CALCIUM 9.2 09/16/2020   ANIONGAP 9 11/27/2015   EGFR 133 09/16/2020   Lab Results  Component Value Date   CHOL 180 09/16/2020   Lab Results  Component Value Date   HDL 47 09/16/2020   Lab Results  Component Value Date   LDLCALC 117 (H) 09/16/2020   Lab Results  Component Value Date   TRIG 86 09/16/2020   Lab Results  Component Value Date   CHOLHDL 3.8 09/16/2020   Lab Results  Component Value Date   HGBA1C 5.4 09/16/2020       Assessment & Plan:  1. Urinary tract infection with hematuria, site unspecified - POCT URINALYSIS DIP (CLINITEK)    No orders of the defined types were placed in this encounter.   Orders Placed This Encounter  Procedures   POCT URINALYSIS DIP (CLINITEK)      Follow-up: Return if symptoms worsen or fail to improve.  An After Visit Summary was printed and given to the patient.  Yetta Flock [facility] [phone]

## 2020-11-12 LAB — URINE CULTURE

## 2020-11-23 ENCOUNTER — Encounter: Payer: Self-pay | Admitting: Nurse Practitioner

## 2020-11-23 ENCOUNTER — Ambulatory Visit (INDEPENDENT_AMBULATORY_CARE_PROVIDER_SITE_OTHER): Payer: Medicaid Other | Admitting: Nurse Practitioner

## 2020-11-23 VITALS — BP 138/82 | HR 70 | Temp 97.3°F | Ht 63.0 in | Wt 213.0 lb

## 2020-11-23 DIAGNOSIS — M62838 Other muscle spasm: Secondary | ICD-10-CM

## 2020-11-23 DIAGNOSIS — M5441 Lumbago with sciatica, right side: Secondary | ICD-10-CM | POA: Diagnosis not present

## 2020-11-23 MED ORDER — TRIAMCINOLONE ACETONIDE 40 MG/ML IJ SUSP
60.0000 mg | Freq: Once | INTRAMUSCULAR | Status: AC
  Administered 2020-11-23: 60 mg via INTRAMUSCULAR

## 2020-11-23 MED ORDER — KETOROLAC TROMETHAMINE 60 MG/2ML IM SOLN
60.0000 mg | Freq: Once | INTRAMUSCULAR | Status: AC
  Administered 2020-11-23: 60 mg via INTRAMUSCULAR

## 2020-11-23 MED ORDER — CYCLOBENZAPRINE HCL 10 MG PO TABS: 10.0000 mg | ORAL_TABLET | Freq: Three times a day (TID) | ORAL | 0 refills | Status: DC | PRN

## 2020-11-23 NOTE — Progress Notes (Signed)
Acute Office Visit  Subjective:    Patient ID: Michelle Lamb, female    DOB: 1990/08/04, 30 y.o.   MRN: [id number]  Chief Complaint  Patient presents with   Low back pain    Right Sided with Sciatica    HPI Patient is in today for right hip/back pain.Onset was yesterday. She has lost 90 pounds since April with gastric bypass surgery. She denies falling, trauma, or previous injury to right hip or back.  Back Pain  She reports new onset back pain. There was not an injury that may have caused the pain. The most recent episode started yesterday and is staying constant. The pain is located in the right hip radiating to calf. It is described as sharp, is 8/10 in intensity, occurring intermittently. Symptoms are worse in the: same all day  Aggravating factors: standing, weight bearing, moving right leg or hip Relieving factors: none.  She has tried application of heat, application of ice, and prescription pain relievers with no relief.   Associated symptoms: No abdominal pain No bowel incontinence  No chest pain No dysuria   No fever No headaches  Yes joint pains right hip No pelvic pain  Yes weakness in leg  No tingling in lower extremities  No urinary incontinence Yes weight loss        Past Medical History:  Diagnosis Date   Benign essential HTN    Diabetes mellitus without complication ([place])    TYPE 2 LAST DOSE FRIDAY OCTOBER 27 (METFORMIN)   History of herpes simplex infection    Hyperlipidemia    Vaginal delivery 2009, 2010, 2015    Past Surgical History:  Procedure Laterality Date   CESAREAN SECTION  09/29/2016   CHOLECYSTECTOMY     DILATION AND CURETTAGE OF UTERUS N/A 12/31/2015   Procedure: SUCTION DILATATION AND CURETTAGE;  Surgeon: Aletha Halim, MD;  Location: [facility];  Service: Gynecology;  Laterality: N/A;   DILATION AND EVACUATION N/A 01/01/2016   Procedure: DILATATION AND EVACUATION;  Surgeon: Jonnie Kind, MD;  Location: [facility];  Service: Gynecology;   Laterality: N/A;   ESOPHAGOGASTRODUODENOSCOPY     GASTRIC BYPASS  06/02/2020   TONSILLECTOMY      Family History  Problem Relation Age of Onset   Hypertension Mother    Cancer Maternal Grandmother        stomach cancer   Depression Maternal Grandfather    Cancer Maternal Grandfather        leukemia   Depression Paternal Grandmother     Social History   Socioeconomic History   Marital status: Married    Spouse name: Not on file   Number of children: 4   Years of education: Not on file   Highest education level: Not on file  Occupational History   Occupation: Physiological scientist  Tobacco Use   Smoking status: Never   Smokeless tobacco: Never  Substance and Sexual Activity   Alcohol use: Yes    Comment: OCCASIONAL   Drug use: No   Sexual activity: Yes    Birth control/protection: None    Comment: Nexplanon removed early 2017  Other Topics Concern   Not on file  Social History Narrative   Not on file   Social Determinants of Health   Financial Resource Strain: Not on file  Food Insecurity: Not on file  Transportation Needs: Not on file  Physical Activity: Not on file  Stress: Not on file  Social Connections: Not on file  Intimate Partner  Violence: Not on file    Outpatient Medications Prior to Visit  Medication Sig Dispense Refill   albuterol (VENTOLIN HFA) 108 (90 Base) MCG/ACT inhaler Inhale 2 puffs into the lungs every 6 (six) hours as needed.     budesonide-formoterol (SYMBICORT) 80-4.5 MCG/ACT inhaler Inhale 2 puffs into the lungs 2 (two) times daily.     escitalopram (LEXAPRO) 5 MG tablet Take 5 mg by mouth daily.     gabapentin (NEURONTIN) 100 MG capsule TAKE 1 CAPSULE BY MOUTH THREE TIMES DAILY AS NEEDED     glucose blood (ACCU-CHEK GUIDE) test strip USE 1 STRIP TO CHECK GLUCOSE THREE TIMES DAILY     lactulose (CHRONULAC) 10 GM/15ML solution SMARTSIG:15 Milliliter(s) By Mouth Every 6 Hours PRN     LORazepam (ATIVAN) 1 MG tablet Take 1 tablet (1  mg total) by mouth daily as needed. 30 tablet 1   omeprazole (PRILOSEC) 20 MG capsule Take 20 mg by mouth daily.     ondansetron (ZOFRAN-ODT) 4 MG disintegrating tablet Take 1 tablet (4 mg total) by mouth every 8 (eight) hours as needed for nausea or vomiting. 30 tablet 1   promethazine (PHENERGAN) 25 MG tablet Take by mouth.     Vitamin D, Ergocalciferol, (DRISDOL) 1.25 MG (50000 UNIT) CAPS capsule One pill twice weekly 24 capsule 1   No facility-administered medications prior to visit.    No Known Allergies  Review of Systems  Constitutional:  Negative for appetite change, fatigue and fever.  HENT:  Negative for congestion, ear pain, sinus pressure and sore throat.   Eyes:  Negative for pain.  Respiratory:  Negative for cough, chest tightness, shortness of breath and wheezing.   Cardiovascular:  Negative for chest pain and palpitations.  Gastrointestinal:  Negative for abdominal pain, constipation, diarrhea, nausea and vomiting.  Genitourinary:  Negative for dysuria and hematuria.  Musculoskeletal:  Positive for back pain (Right sided with sciatica). Negative for arthralgias, joint swelling and myalgias.  Skin:  Negative for rash.  Neurological:  Negative for dizziness, weakness and headaches.  Psychiatric/Behavioral:  Negative for dysphoric mood. The patient is not nervous/anxious.       Objective:    Physical Exam Vitals reviewed.  Constitutional:      Appearance: Normal appearance.  Cardiovascular:     Rate and Rhythm: Normal rate and regular rhythm.     Pulses: Normal pulses.     Heart sounds: Normal heart sounds.  Pulmonary:     Effort: Pulmonary effort is normal.     Breath sounds: Normal breath sounds.  Abdominal:     General: Bowel sounds are normal.     Palpations: Abdomen is soft.  Musculoskeletal:        General: Swelling and tenderness present.     Cervical back: Normal.     Thoracic back: Normal.     Lumbar back: Normal.     Right hip: Tenderness and bony  tenderness present. Decreased range of motion. Decreased strength.  Skin:    General: Skin is warm and dry.     Capillary Refill: Capillary refill takes less than 2 seconds.  Neurological:     General: No focal deficit present.     Mental Status: She is alert and oriented to person, place, and time.  Psychiatric:        Mood and Affect: Mood normal.        Behavior: Behavior normal.    BP 138/82 (BP Location: Left Arm, Patient Position: Sitting)   Pulse 70  Temp (!) 97.3 F (36.3 C) (Temporal)   Ht 5' 3"  (1.6 m)   Wt 213 lb (96.6 kg)   SpO2 98%   BMI 37.73 kg/m  Wt Readings from Last 3 Encounters:  11/23/20 213 lb (96.6 kg)  11/09/20 213 lb (96.6 kg)  10/05/20 223 lb (101.2 kg)    Health Maintenance Due  Topic Date Due   COVID-19 Vaccine (3 - Booster for Pfizer series) 09/09/2019   INFLUENZA VACCINE  09/28/2020       Lab Results  Component Value Date   TSH 0.79 12/28/2015   Lab Results  Component Value Date   WBC 10.9 (H) 09/16/2020   HGB 12.3 09/16/2020   HCT 38.1 09/16/2020   MCV 81 09/16/2020   PLT 346 09/16/2020   Lab Results  Component Value Date   NA 145 (H) 09/16/2020   K 3.8 09/16/2020   CO2 24 09/16/2020   GLUCOSE 88 09/16/2020   BUN 9 09/16/2020   CREATININE 0.43 (L) 09/16/2020   BILITOT 0.5 09/16/2020   ALKPHOS 75 09/16/2020   AST 24 09/16/2020   ALT 23 09/16/2020   PROT 6.5 09/16/2020   ALBUMIN 4.4 09/16/2020   CALCIUM 9.2 09/16/2020   ANIONGAP 9 11/27/2015   EGFR 133 09/16/2020   Lab Results  Component Value Date   CHOL 180 09/16/2020   Lab Results  Component Value Date   HDL 47 09/16/2020   Lab Results  Component Value Date   LDLCALC 117 (H) 09/16/2020   Lab Results  Component Value Date   TRIG 86 09/16/2020   Lab Results  Component Value Date   CHOLHDL 3.8 09/16/2020   Lab Results  Component Value Date   HGBA1C 5.4 09/16/2020         Assessment & Plan:    1. Acute right-sided low back pain with  right-sided sciatica - ketorolac (TORADOL) injection 60 mg - triamcinolone acetonide (KENALOG-40) injection 60 mg - cyclobenzaprine (FLEXERIL) 10 MG tablet; Take 1 tablet (10 mg total) by mouth 3 (three) times daily as needed for muscle spasms.  Dispense: 30 tablet; Refill: 0  2. Muscle spasm of right lower extremity - cyclobenzaprine (FLEXERIL) 10 MG tablet; Take 1 tablet (10 mg total) by mouth 3 (three) times daily as needed for muscle spasms.  Dispense: 30 tablet; Refill: 0     Take Flexeril 10 mg up to three times daily Apply Voltaren gel to right hip,leg Kenalog and Toradol given in office Alternate heat and ice to right hip Perform back stretching exercises as tolerated Follow-up as needed  I,Lauren M Auman,acting as a scribe for [employer], NP.,have documented all relevant documentation on the behalf of Rip Harbour, NP,as directed by  Rip Harbour, NP while in the presence of Rip Harbour, NP.   I, Rip Harbour, NP, have reviewed all documentation for this visit. The documentation on 11/23/20 for the exam, diagnosis, procedures, and orders are all accurate and complete.    SignedJerrell Belfast, DNP 11/27/20 at 11:36 AM

## 2020-11-23 NOTE — Patient Instructions (Addendum)
Take Flexeril 10 mg up to three times daily Apply Voltaren gel to right hip,leg Kenalog and Toradol given in office Alternate heat and ice to right hip Perform back stretching exercises as tolerated Follow-up as needed  Acute Back Pain, Adult Acute back pain is sudden and usually short-lived. It is often caused by an injury to the muscles and tissues in the back. The injury may result from: A muscle, tendon, or ligament getting overstretched or torn. Ligaments are tissues that connect bones to each other. Lifting something improperly can cause a back strain. Wear and tear (degeneration) of the spinal disks. Spinal disks are circular tissue that provide cushioning between the bones of the spine (vertebrae). Twisting motions, such as while playing sports or doing yard work. A hit to the back. Arthritis. You may have a physical exam, lab tests, and imaging tests to find the cause of your pain. Acute back pain usually goes away with rest and home care. Follow these instructions at home: Managing pain, stiffness, and swelling Take over-the-counter and prescription medicines only as told by your health care provider. Treatment may include medicines for pain and inflammation that are taken by mouth or applied to the skin, or muscle relaxants. Your health care provider may recommend applying ice during the first 24-48 hours after your pain starts. To do this: Put ice in a plastic bag. Place a towel between your skin and the bag. Leave the ice on for 20 minutes, 2-3 times a day. Remove the ice if your skin turns bright red. This is very important. If you cannot feel pain, heat, or cold, you have a greater risk of damage to the area. If directed, apply heat to the affected area as often as told by your health care provider. Use the heat source that your health care provider recommends, such as a moist heat pack or a heating pad. Place a towel between your skin and the heat source. Leave the heat on  for 20-30 minutes. Remove the heat if your skin turns bright red. This is especially important if you are unable to feel pain, heat, or cold. You have a greater risk of getting burned. Activity  Do not stay in bed. Staying in bed for more than 1-2 days can delay your recovery. Sit up and stand up straight. Avoid leaning forward when you sit or hunching over when you stand. If you work at a desk, sit close to it so you do not need to lean over. Keep your chin tucked in. Keep your neck drawn back, and keep your elbows bent at a 90-degree angle (right angle). Sit high and close to the steering wheel when you drive. Add lower back (lumbar) support to your car seat, if needed. Take short walks on even surfaces as soon as you are able. Try to increase the length of time you walk each day. Do not sit, drive, or stand in one place for more than 30 minutes at a time. Sitting or standing for long periods of time can put stress on your back. Do not drive or use heavy machinery while taking prescription pain medicine. Use proper lifting techniques. When you bend and lift, use positions that put less stress on your back: [place] your knees. Keep the load close to your body. Avoid twisting. Exercise regularly as told by your health care provider. Exercising helps your back heal faster and helps prevent back injuries by keeping muscles strong and flexible. Work with a physical therapist to make a  safe exercise program, as recommended by your health care provider. Do any exercises as told by your physical therapist. Lifestyle Maintain a healthy weight. Extra weight puts stress on your back and makes it difficult to have good posture. Avoid activities or situations that make you feel anxious or stressed. Stress and anxiety increase muscle tension and can make back pain worse. Learn ways to manage anxiety and stress, such as through exercise. General instructions Sleep on a firm mattress in a comfortable position.  Try lying on your side with your knees slightly bent. If you lie on your back, put a pillow under your knees. Keep your head and neck in a straight line with your spine (neutral position) when using electronic equipment like smartphones or pads. To do this: Raise your smartphone or pad to look at it instead of bending your head or neck to look down. Put the smartphone or pad at the level of your face while looking at the screen. Follow your treatment plan as told by your health care provider. This may include: Cognitive or behavioral therapy. Acupuncture or massage therapy. Meditation or yoga. Contact a health care provider if: You have pain that is not relieved with rest or medicine. You have increasing pain going down into your legs or buttocks. Your pain does not improve after 2 weeks. You have pain at night. You lose weight without trying. You have a fever or chills. You develop nausea or vomiting. You develop abdominal pain. Get help right away if: You develop new bowel or bladder control problems. You have unusual weakness or numbness in your arms or legs. You feel faint. These symptoms may represent a serious problem that is an emergency. Do not wait to see if the symptoms will go away. Get medical help right away. Call your local emergency services (911 in the [place] Do not drive yourself to the hospital. Summary Acute back pain is sudden and usually short-lived. Use proper lifting techniques. When you bend and lift, use positions that put less stress on your back. Take over-the-counter and prescription medicines only as told by your health care provider, and apply heat or ice as told. This information is not intended to replace advice given to you by your health care provider. Make sure you discuss any questions you have with your health care provider. Document Revised: 05/08/2020 Document Reviewed: 05/08/2020 [employer] Patient Education  [employer] [employer]. Back Exercises These  exercises help to make your trunk and back strong. They also help to keep the lower back flexible. Doing these exercises can help to prevent or lessen pain in your lower back. If you have back pain, try to do these exercises 2-3 times each day or as told by your doctor. As you get better, do the exercises once each day. Repeat the exercises more often as told by your doctor. To stop back pain from coming back, do the exercises once each day, or as told by your doctor. Do exercises exactly as told by your doctor. Stop right away if you feel sudden pain or your pain gets worse. Exercises Single knee to chest Do these steps 3-5 times in a row for each leg: Lie on your back on a firm bed or the floor with your legs stretched out. Bring one knee to your chest. Grab your knee or thigh with both hands and hold it in place. Pull on your knee until you feel a gentle stretch in your lower back or butt. Keep doing the stretch for  10-30 seconds. Slowly let go of your leg and straighten it. Pelvic tilt Do these steps 5-10 times in a row: Lie on your back on a firm bed or the floor with your legs stretched out. Bend your knees so they point up to the ceiling. Your feet should be flat on the floor. Tighten your lower belly (abdomen) muscles to press your lower back against the floor. This will make your tailbone point up to the ceiling instead of pointing down to your feet or the floor. Stay in this position for 5-10 seconds while you gently tighten your muscles and breathe evenly. Cat-cow Do these steps until your lower back bends more easily: Get on your hands and knees on a firm bed or the floor. Keep your hands under your shoulders, and keep your knees under your hips. You may put padding under your knees. Let your head hang down toward your chest. Tighten (contract) the muscles in your belly. Point your tailbone toward the floor so your lower back becomes rounded like the back of a cat. Stay in this  position for 5 seconds. Slowly lift your head. Let the muscles of your belly relax. Point your tailbone up toward the ceiling so your back forms a sagging arch like the back of a cow. Stay in this position for 5 seconds.  Press-ups Do these steps 5-10 times in a row: Lie on your belly (face-down) on a firm bed or the floor. Place your hands near your head, about shoulder-width apart. While you keep your back relaxed and keep your hips on the floor, slowly straighten your arms to raise the top half of your body and lift your shoulders. Do not use your back muscles. You may change where you place your hands to make yourself more comfortable. Stay in this position for 5 seconds. Keep your back relaxed. Slowly return to lying flat on the floor.  Bridges Do these steps 10 times in a row: Lie on your back on a firm bed or the floor. Bend your knees so they point up to the ceiling. Your feet should be flat on the floor. Your arms should be flat at your sides, next to your body. Tighten your butt muscles and lift your butt off the floor until your waist is almost as high as your knees. If you do not feel the muscles working in your butt and the back of your thighs, slide your feet 1-2 inches (2.5-5 cm) farther away from your butt. Stay in this position for 3-5 seconds. Slowly lower your butt to the floor, and let your butt muscles relax. If this exercise is too easy, try doing it with your arms crossed over your chest. Belly crunches Do these steps 5-10 times in a row: Lie on your back on a firm bed or the floor with your legs stretched out. Bend your knees so they point up to the ceiling. Your feet should be flat on the floor. Cross your arms over your chest. Tip your chin a little bit toward your chest, but do not bend your neck. Tighten your belly muscles and slowly raise your chest just enough to lift your shoulder blades a tiny bit off the floor. Avoid raising your body higher than that  because it can put too much stress on your lower back. Slowly lower your chest and your head to the floor. Back lifts Do these steps 5-10 times in a row: Lie on your belly (face-down) with your arms at your sides,  and rest your forehead on the floor. Tighten the muscles in your legs and your butt. Slowly lift your chest off the floor while you keep your hips on the floor. Keep the back of your head in line with the curve in your back. Look at the floor while you do this. Stay in this position for 3-5 seconds. Slowly lower your chest and your face to the floor. Contact a doctor if: Your back pain gets a lot worse when you do an exercise. Your back pain does not get better within 2 hours after you exercise. If you have any of these problems, stop doing the exercises. Do not do them again unless your doctor says it is okay. Get help right away if: You have sudden, very bad back pain. If this happens, stop doing the exercises. Do not do them again unless your doctor says it is okay. This information is not intended to replace advice given to you by your health care provider. Make sure you discuss any questions you have with your health care provider. Document Revised: 04/29/2020 Document Reviewed: 04/29/2020 Elsevier Patient Education  [place].

## 2020-11-26 ENCOUNTER — Encounter: Payer: Self-pay | Admitting: Family Medicine

## 2020-11-26 ENCOUNTER — Telehealth: Payer: Self-pay | Admitting: Nurse Practitioner

## 2020-11-26 ENCOUNTER — Other Ambulatory Visit: Payer: Self-pay | Admitting: Nurse Practitioner

## 2020-11-26 DIAGNOSIS — M5441 Lumbago with sciatica, right side: Secondary | ICD-10-CM

## 2020-11-26 DIAGNOSIS — K219 Gastro-esophageal reflux disease without esophagitis: Secondary | ICD-10-CM

## 2020-11-26 MED ORDER — OMEPRAZOLE 20 MG PO CPDR: DELAYED_RELEASE_CAPSULE | ORAL | 0 refills | Status: DC

## 2020-11-26 MED ORDER — PREDNISONE 20 MG PO TABS: 20.0000 mg | ORAL_TABLET | Freq: Every day | ORAL | 0 refills | Status: DC

## 2020-11-26 NOTE — Telephone Encounter (Signed)
Pt continues to experience right lower back pain and difficulty ambulating. Prednisone taper prescribed. Pt to take Prilosec 20 mg BID while taking Prednisone. Verbalized understanding of all instructions.

## 2020-11-29 ENCOUNTER — Ambulatory Visit
Admission: RE | Admit: 2020-11-29 | Discharge: 2020-11-29 | Disposition: A | Discharge status: Home / Self Care | Payer: Medicaid Other | Source: Ambulatory Visit | Attending: Nurse Practitioner | Admitting: Nurse Practitioner

## 2020-11-29 ENCOUNTER — Other Ambulatory Visit: Payer: Self-pay

## 2020-11-29 DIAGNOSIS — K769 Liver disease, unspecified: Secondary | ICD-10-CM

## 2020-11-29 IMAGING — MR MR ABDOMEN WO/W CM
14 of 16 series · 41 of 48 positions shown · IV contrast (multihance)
Comparison: CT of the abdomen and pelvis [DATE].
COMPARISON: CT of the abdomen and pelvis [DATE].

Addendum:
CLINICAL DATA: 30-year-old female with history of abdominal pain
for the past several months. Indeterminate liver lesion noted on
prior CT examination. Follow-up study.

EXAM:
MRI ABDOMEN WITHOUT AND WITH CONTRAST
TECHNIQUE: Multiplanar multisequence MR imaging of the abdomen was performed
both before and after the administration of intravenous contrast.
CONTRAST:  20mL MULTIHANCE GADOBENATE DIMEGLUMINE 529 MG/ML IV SOLN

[Series 2: T2 · coronal · 5.0mm · 1.56mm/px · 2 of 34 slices shown (1 of 3)]
[im 1/34]
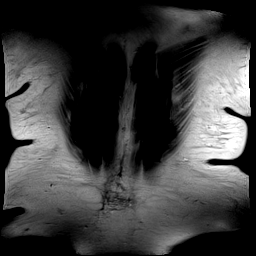
[im 34/34]
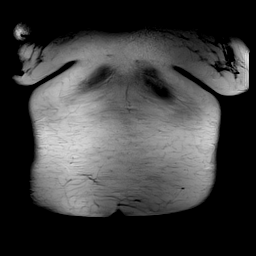

[Series 3: T1 · axial · 3.0mm · 1.25mm/px · z∈[-99,+137]mm · 8 of 160 slices shown]
[im 1/160]
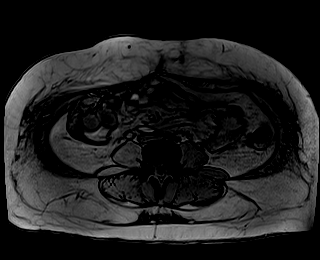
[im 23/160]
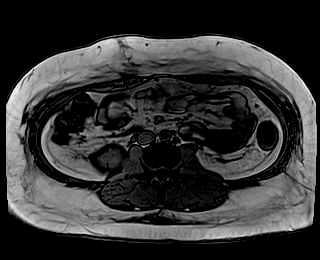
[im 46/160]
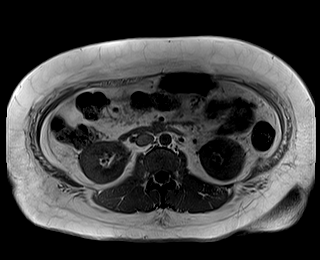
[im 69/160]
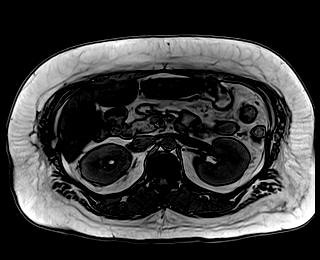
[im 91/160]
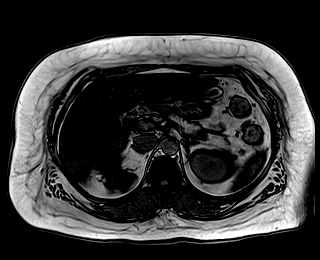
[im 114/160]
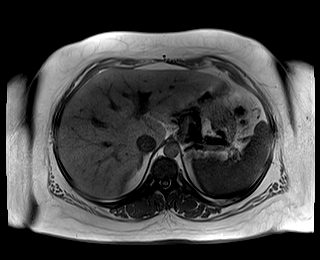
[im 137/160]
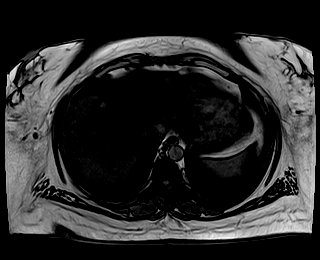
[im 160/160]
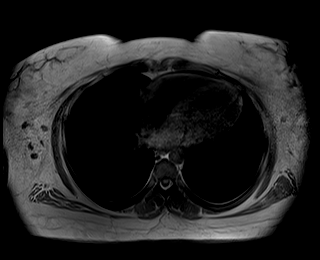

[Series 4: bSSFP · axial · 5.0mm · 1.25mm/px · z∈[-101,+139]mm · 3 of 41 slices shown]
[im 1/41]
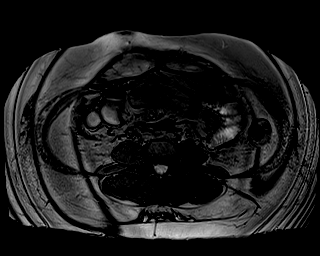
[im 21/41]
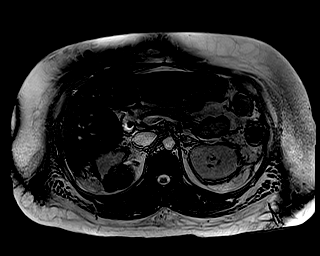
[im 41/41]
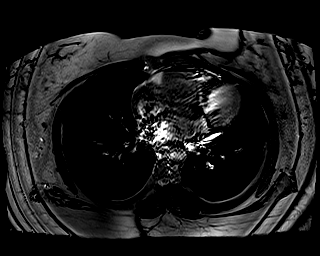

[Series 5: T2 · axial · 5.0mm · 1.56mm/px · z∈[-96,+162]mm · 2 of 44 slices shown (2 of 3)]
[im 1/44]
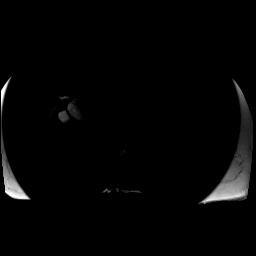
[im 44/44]
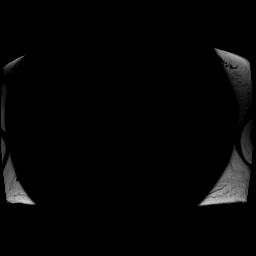

[Series 6: DWI · axial · 5.0mm · 1.42mm/px · z∈[-72,+138]mm · 4 of 108 slices shown (1 of 2)]
[im 1/108]
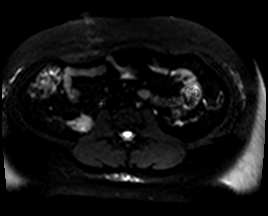
[im 36/108]
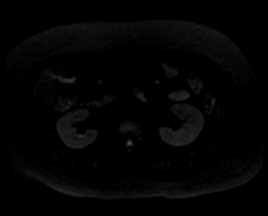
[im 72/108]
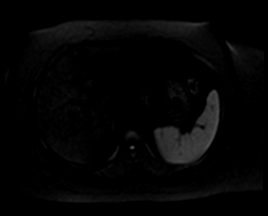
[im 108/108]
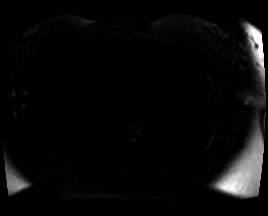

[Series 7: DWI · axial · 5.0mm · 1.42mm/px · 1 of 36 slices shown (2 of 2)]
[im 1/36]
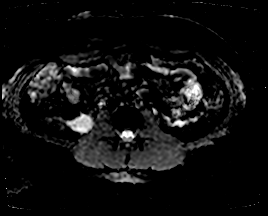

[Series 8: T2 · axial · 6.0mm · 1.25mm/px · 1 of 34 slices shown (3 of 3)]
[im 1/34]
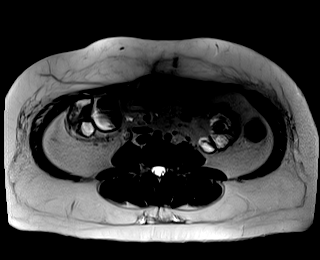

[Series 10: T1 dynamic · axial · non-contrast · 3.0mm · 1.25mm/px · z∈[-93,+143]mm · 3 of 80 slices shown]
[im 1/80]
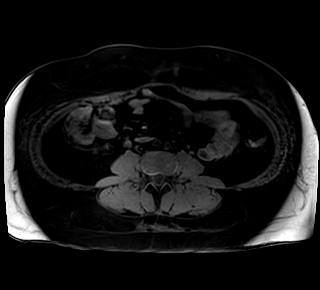
[im 40/80]
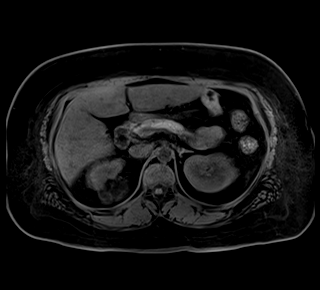
[im 80/80]
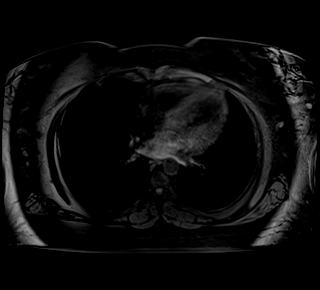

[Series 11: T1 dynamic post-contrast · axial · 3.0mm · 1.25mm/px · z∈[-93,+143]mm · 3 of 80 slices shown (1 of 5)]
[im 1/80]
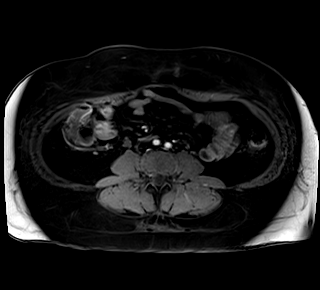
[im 40/80]
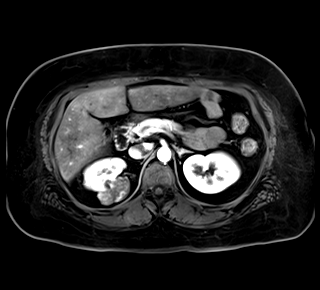
[im 80/80]
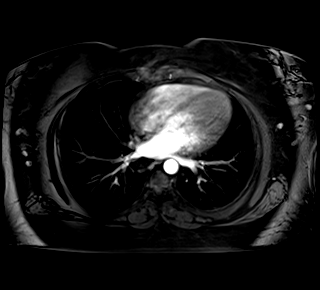

[Series 12: T1 dynamic post-contrast · axial · 3.0mm · 1.25mm/px · z∈[-93,+143]mm · 3 of 80 slices shown (2 of 5)]
[im 1/80]
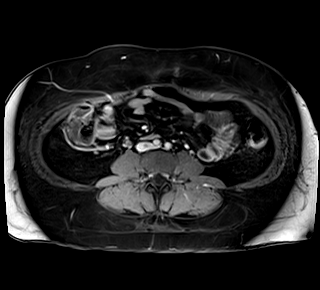
[im 40/80]
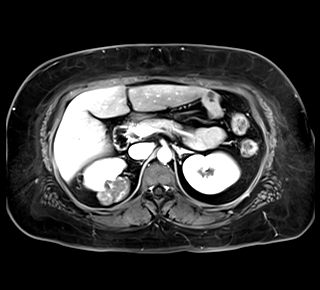
[im 80/80]
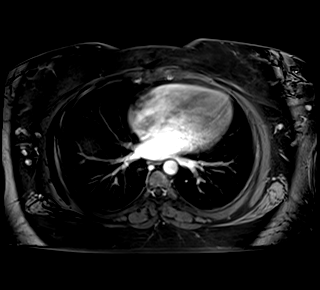

[Series 13: T1 dynamic post-contrast · axial · 3.0mm · 1.25mm/px · z∈[-93,+143]mm · 3 of 80 slices shown (3 of 5)]
[im 1/80]
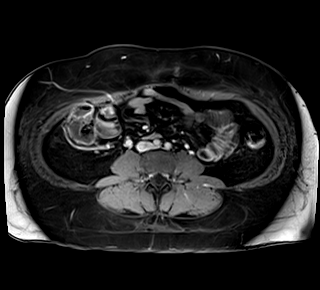
[im 40/80]
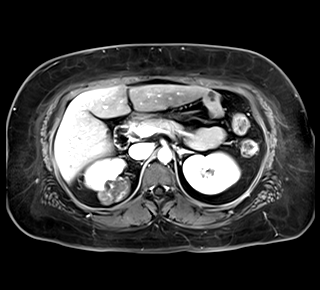
[im 80/80]
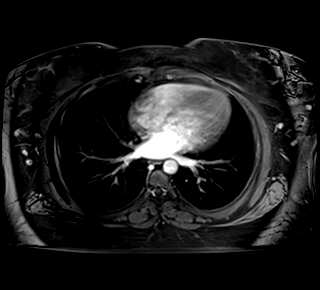

[Series 14: T1 dynamic post-contrast · coronal · 3.0mm · 1.25mm/px · 3 of 72 slices shown (4 of 5)]
[im 1/72]
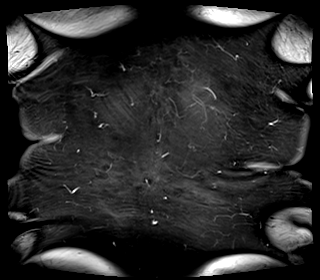
[im 36/72]
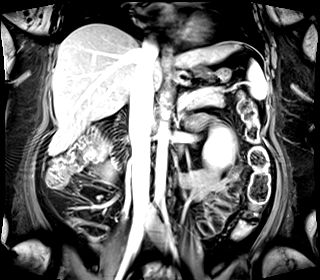
[im 72/72]
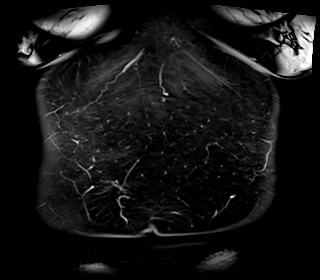

[Series 15: T1 dynamic post-contrast · axial · 3.0mm · 1.25mm/px · z∈[-93,+143]mm · 3 of 80 slices shown (5 of 5)]
[im 1/80]
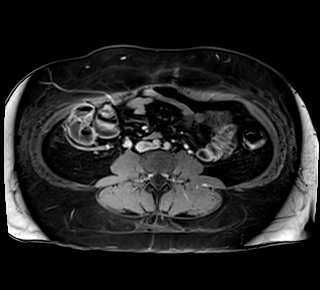
[im 40/80]
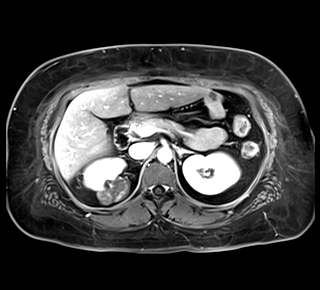
[im 80/80]
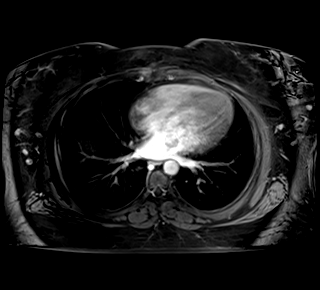

[Series 100: sub 25 sec · axial · 3.0mm · 1.25mm/px · z∈[-93,+23]mm · 2 of 80 slices shown]
[im 1/80]
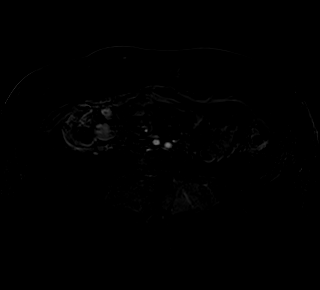
[im 40/80]
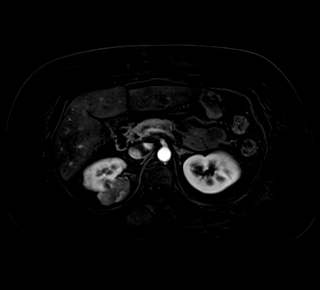

[41 of 48 positions shown; findings below may reference images not displayed]

FINDINGS: Lower chest: Unremarkable.

Hepatobiliary: Liver has a slightly nodular contour. There is severe
diffuse but heterogeneous loss of signal intensity throughout the
hepatic parenchyma on out of phase dual echo images, indicative of a
background of severe hepatic steatosis. Several small nodules are
noted throughout the hepatic parenchyma which demonstrate T1
hyperintensity (example in segment 8 on axial image 19 of series 10
measuring 1.3 x 1.0 cm). Some of these demonstrate mild T2
hyperintensity, and some of these demonstrate arterial phase
hyperenhancement (example on axial image 34 of series 11 measuring
1.0 x 0.8 cm in segment 3), but none of these demonstrate washout on
delayed post gadolinium imaging. In the central aspects of segments
4A and B of the liver (coronal image 49 of series 14 and axial image
32 of series 13) there is a lesion measuring approximately 5.5 x
x 3.4 cm which corresponds with more focal area of severe loss of
signal intensity on out of phase dual echo images, and heterogeneous
T1 and T2 signal intensity (predominantly centrally T1 hyperintense
and peripherally T1 hypointense, with mild generalized T2
hyperintensity), which demonstrates some central arterial phase
hyperenhancement which becomes less pronounced on delayed imaging.
Notably, there is no capsular pseudocapsule on delayed imaging. No
intra or extrahepatic biliary ductal dilatation. Status post
cholecystectomy.

Pancreas: No pancreatic mass. No pancreatic ductal dilatation. No
pancreatic or peripancreatic fluid collections or inflammatory
changes.

Spleen:  Unremarkable.

Adrenals/Urinary Tract: In the medial aspect of the upper pole of
the right kidney (axial image 21 of series 5 and coronal image 8 of
series 2) there is an exophytic lesion which predominantly follows
fat signal intensity on all pulse sequences, and demonstrates some
heterogeneous internal enhancement, compatible with a large
angiomyolipoma measuring 4.4 x 3.4 x 4.0 cm. Left kidney and
bilateral adrenal glands are normal in appearance. No
hydroureteronephrosis in the visualized portions of the abdomen.

Stomach/Bowel: Unremarkable.

Vascular/Lymphatic: No aneurysm identified in the visualized
abdominal vasculature. No lymphadenopathy noted in the abdomen.

Other:  No significant volume of ascites.  No pneumoperitoneum.

Musculoskeletal: Aggressive appearing osseous lesions are noted in
the visualized portions of the skeleton.
IMPRESSION: 1. There is a nodular liver with a background of hepatic steatosis.
In addition to multiple small nodular lesions in the liver, there is
a dominant lesion in segment 4A/4B has imaging characteristics
suggestive of a large hepatic adenoma containing large amounts of
fat. At this time, no definitive imaging findings to clearly
indicate an aggressive lesion such as a hepatocellular carcinoma are
confidently identified. Follow-up evaluation with repeat abdominal
MRI with and without IV gadolinium is recommended in 3-6 months to
ensure the stability of this and the smaller lesions.
2. Large angiomyolipoma in the upper pole of the right kidney,
similar to prior CT examination. Urologic consultation for further
management is recommended.

ADDENDUM:
There was a voice recognition error in the original dictation. The
musculoskeletal section of the findings should read: "NO aggressive
appearing osseous lesions are noted in the visualized portions of
the skeleton."

*** End of Addendum ***
FINDINGS: Lower chest: Unremarkable.

Hepatobiliary: Liver has a slightly nodular contour. There is severe
diffuse but heterogeneous loss of signal intensity throughout the
hepatic parenchyma on out of phase dual echo images, indicative of a
background of severe hepatic steatosis. Several small nodules are
noted throughout the hepatic parenchyma which demonstrate T1
hyperintensity (example in segment 8 on axial image 19 of series 10
measuring 1.3 x 1.0 cm). Some of these demonstrate mild T2
hyperintensity, and some of these demonstrate arterial phase
hyperenhancement (example on axial image 34 of series 11 measuring
1.0 x 0.8 cm in segment 3), but none of these demonstrate washout on
delayed post gadolinium imaging. In the central aspects of segments
4A and B of the liver (coronal image 49 of series 14 and axial image
32 of series 13) there is a lesion measuring approximately 5.5 x
x 3.4 cm which corresponds with more focal area of severe loss of
signal intensity on out of phase dual echo images, and heterogeneous
T1 and T2 signal intensity (predominantly centrally T1 hyperintense
and peripherally T1 hypointense, with mild generalized T2
hyperintensity), which demonstrates some central arterial phase
hyperenhancement which becomes less pronounced on delayed imaging.
Notably, there is no capsular pseudocapsule on delayed imaging. No
intra or extrahepatic biliary ductal dilatation. Status post
cholecystectomy.

Pancreas: No pancreatic mass. No pancreatic ductal dilatation. No
pancreatic or peripancreatic fluid collections or inflammatory
changes.

Spleen:  Unremarkable.

Adrenals/Urinary Tract: In the medial aspect of the upper pole of
the right kidney (axial image 21 of series 5 and coronal image 8 of
series 2) there is an exophytic lesion which predominantly follows
fat signal intensity on all pulse sequences, and demonstrates some
heterogeneous internal enhancement, compatible with a large
angiomyolipoma measuring 4.4 x 3.4 x 4.0 cm. Left kidney and
bilateral adrenal glands are normal in appearance. No
hydroureteronephrosis in the visualized portions of the abdomen.

Stomach/Bowel: Unremarkable.

Vascular/Lymphatic: No aneurysm identified in the visualized
abdominal vasculature. No lymphadenopathy noted in the abdomen.

Other:  No significant volume of ascites.  No pneumoperitoneum.

Musculoskeletal: Aggressive appearing osseous lesions are noted in
the visualized portions of the skeleton.
IMPRESSION: 1. There is a nodular liver with a background of hepatic steatosis.
In addition to multiple small nodular lesions in the liver, there is
a dominant lesion in segment 4A/4B has imaging characteristics
suggestive of a large hepatic adenoma containing large amounts of
fat. At this time, no definitive imaging findings to clearly
indicate an aggressive lesion such as a hepatocellular carcinoma are
confidently identified. Follow-up evaluation with repeat abdominal
MRI with and without IV gadolinium is recommended in 3-6 months to
ensure the stability of this and the smaller lesions.
2. Large angiomyolipoma in the upper pole of the right kidney,
similar to prior CT examination. Urologic consultation for further
management is recommended.

## 2020-11-29 MED ORDER — GADOBENATE DIMEGLUMINE 529 MG/ML IV SOLN
20.0000 mL | Freq: Once | INTRAVENOUS | Status: AC | PRN
  Administered 2020-11-29: 20 mL via INTRAVENOUS

## 2020-12-01 ENCOUNTER — Other Ambulatory Visit: Payer: Self-pay

## 2020-12-01 DIAGNOSIS — D1771 Benign lipomatous neoplasm of kidney: Secondary | ICD-10-CM

## 2020-12-01 DIAGNOSIS — R634 Abnormal weight loss: Secondary | ICD-10-CM

## 2020-12-01 MED ORDER — OZEMPIC (0.25 OR 0.5 MG/DOSE) 2 MG/1.5ML ~~LOC~~ SOPN: 0.2500 mg | PEN_INJECTOR | SUBCUTANEOUS | 0 refills | Status: DC

## 2020-12-03 ENCOUNTER — Other Ambulatory Visit: Payer: Self-pay | Admitting: Nurse Practitioner

## 2020-12-03 DIAGNOSIS — E1169 Type 2 diabetes mellitus with other specified complication: Secondary | ICD-10-CM

## 2020-12-03 MED ORDER — SEMAGLUTIDE (1 MG/DOSE) 4 MG/3ML ~~LOC~~ SOPN: 1.0000 mg | PEN_INJECTOR | SUBCUTANEOUS | 0 refills | Status: DC

## 2020-12-10 ENCOUNTER — Ambulatory Visit (INDEPENDENT_AMBULATORY_CARE_PROVIDER_SITE_OTHER): Payer: Medicaid Other | Admitting: Nurse Practitioner

## 2020-12-10 ENCOUNTER — Encounter: Payer: Self-pay | Admitting: Physician Assistant

## 2020-12-10 DIAGNOSIS — Z23 Encounter for immunization: Secondary | ICD-10-CM | POA: Diagnosis not present

## 2020-12-14 ENCOUNTER — Ambulatory Visit: Payer: Medicaid Other | Admitting: Nurse Practitioner

## 2020-12-14 ENCOUNTER — Ambulatory Visit (INDEPENDENT_AMBULATORY_CARE_PROVIDER_SITE_OTHER): Payer: Medicaid Other | Admitting: Family Medicine

## 2020-12-14 ENCOUNTER — Other Ambulatory Visit: Payer: Self-pay

## 2020-12-14 ENCOUNTER — Encounter: Payer: Self-pay | Admitting: Family Medicine

## 2020-12-14 VITALS — Ht 63.0 in | Wt 213.0 lb

## 2020-12-14 DIAGNOSIS — J101 Influenza due to other identified influenza virus with other respiratory manifestations: Secondary | ICD-10-CM

## 2020-12-14 HISTORY — DX: Influenza due to other identified influenza virus with other respiratory manifestations: J10.1

## 2020-12-14 MED ORDER — OSELTAMIVIR PHOSPHATE 75 MG PO CAPS: 75.0000 mg | ORAL_CAPSULE | Freq: Two times a day (BID) | ORAL | 0 refills | Status: AC

## 2020-12-14 NOTE — Progress Notes (Signed)
Virtual Visit via Video Note   This visit type was conducted due to national recommendations for restrictions regarding the COVID-19 Pandemic (e.g. social distancing) in an effort to limit this patient's exposure and mitigate transmission in our community.  Due to her co-morbid illnesses, this patient is at least at moderate risk for complications without adequate follow up.  This format is felt to be most appropriate for this patient at this time.  All issues noted in this document were discussed and addressed.  A limited physical exam was performed with this format.  A verbal consent was obtained for the virtual visit.   Patient Location:home Provider Location:office Evaluation Performed:  acute Acute Office Visit  Subjective:    Patient ID: Michelle Lamb, female    DOB: [birth year], 30 y.o.   MRN: [id number]  Chief Complaint  Patient presents with   URI    HPI: Patient is in today for URI sxs started this morning has only taken tylenol. 2 children tested positive for flu A. C/o headache and fatigue. Denies fever/chills/SOB/ST  Past Medical History:  Diagnosis Date   Benign essential HTN    Diabetes mellitus without complication ([place])    TYPE 2 LAST DOSE FRIDAY OCTOBER 27 (METFORMIN)   History of herpes simplex infection    Hyperlipidemia    Vaginal delivery 2009, 2010, 2015    Past Surgical History:  Procedure Laterality Date   CESAREAN SECTION  09/29/2016   CHOLECYSTECTOMY     DILATION AND CURETTAGE OF UTERUS N/A 12/31/2015   Procedure: SUCTION DILATATION AND CURETTAGE;  Surgeon: Aletha Halim, MD;  Location: [facility];  Service: Gynecology;  Laterality: N/A;   DILATION AND EVACUATION N/A 01/01/2016   Procedure: DILATATION AND EVACUATION;  Surgeon: Jonnie Kind, MD;  Location: [facility];  Service: Gynecology;  Laterality: N/A;   ESOPHAGOGASTRODUODENOSCOPY     GASTRIC BYPASS  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/2020   TONSILLECTOMY      Family History  Problem Relation Age of Onset   Hypertension  Mother    Cancer Maternal Grandmother        stomach cancer   Depression Maternal Grandfather    Cancer Maternal Grandfather        leukemia   Depression Paternal Grandmother     Social History   Socioeconomic History   Marital status: Married    Spouse name: Not on file   Number of children: 4   Years of education: Not on file   Highest education level: Not on file  Occupational History   Occupation: Physiological scientist  Tobacco Use   Smoking status: Never   Smokeless tobacco: Never  Substance and Sexual Activity   Alcohol use: Yes    Comment: OCCASIONAL   Drug use: No   Sexual activity: Yes    Birth control/protection: None    Comment: Nexplanon removed early 2017  Other Topics Concern   Not on file  Social History Narrative   Not on file   Social Determinants of Health   Financial Resource Strain: Not on file  Food Insecurity: Not on file  Transportation Needs: Not on file  Physical Activity: Not on file  Stress: Not on file  Social Connections: Not on file  Intimate Partner Violence: Not on file    Outpatient Medications Prior to Visit  Medication Sig Dispense Refill   albuterol (VENTOLIN HFA) 108 (90 Base) MCG/ACT inhaler Inhale 2 puffs into the lungs every 6 (six) hours as needed.     budesonide-formoterol (SYMBICORT) 80-4.5  MCG/ACT inhaler Inhale 2 puffs into the lungs 2 (two) times daily.     cyclobenzaprine (FLEXERIL) 10 MG tablet Take 1 tablet (10 mg total) by mouth 3 (three) times daily as needed for muscle spasms. 30 tablet 0   escitalopram (LEXAPRO) 5 MG tablet Take 5 mg by mouth daily.     gabapentin (NEURONTIN) 100 MG capsule TAKE 1 CAPSULE BY MOUTH THREE TIMES DAILY AS NEEDED     glucose blood (ACCU-CHEK GUIDE) test strip USE 1 STRIP TO CHECK GLUCOSE THREE TIMES DAILY     lactulose (CHRONULAC) 10 GM/15ML solution SMARTSIG:15 Milliliter(s) By Mouth Every 6 Hours PRN     LORazepam (ATIVAN) 1 MG tablet Take 1 tablet (1 mg total) by mouth  daily as needed. 30 tablet 1   omeprazole (PRILOSEC) 20 MG capsule Take twice daily while taking prednisone 30 capsule 0   ondansetron (ZOFRAN-ODT) 4 MG disintegrating tablet Take 1 tablet (4 mg total) by mouth every 8 (eight) hours as needed for nausea or vomiting. 30 tablet 1   promethazine (PHENERGAN) 25 MG tablet Take by mouth.     Semaglutide,0.25 or 0.5MG/DOS, (OZEMPIC, 0.25 OR 0.5 MG/DOSE,) 2 MG/1.5ML SOPN Inject 0.25 mg into the skin once a week. For 4 weeks starting 12/01/2020-12/29/2020. 1.5 mL 0   Vitamin D, Ergocalciferol, (DRISDOL) 1.25 MG (50000 UNIT) CAPS capsule One pill twice weekly 24 capsule 1   predniSONE (DELTASONE) 20 MG tablet Take 1 tablet (20 mg total) by mouth daily with breakfast. 1 po tid for 3 days then 1 po bid for 3 days then 1 po qd for 3 days 18 tablet 0   Semaglutide, 1 MG/DOSE, 4 MG/3ML SOPN Inject 1 mg as directed once a week. 3 mL 0   No facility-administered medications prior to visit.    No Known Allergies  Review of Systems  Constitutional:  Positive for fatigue. Negative for chills and fever.  HENT:  Positive for postnasal drip. Negative for congestion, ear pain, rhinorrhea, sinus pressure, sinus pain and sore throat.   Respiratory:  Negative for cough and shortness of breath.   Cardiovascular:  Negative for chest pain.  Gastrointestinal:  Negative for diarrhea and nausea.  Neurological:  Positive for headaches. Negative for dizziness.      Objective:    Physical Exam  Ht _0  (1.6 m)   Wt 213 lb (96.6 kg)   BMI 37.73 kg/m  Wt Readings from Last 3 Encounters:  12/14/20 213 lb (96.6 kg)  11/23/20 213 lb (96.6 kg)  11/09/20 213 lb (96.6 kg)    Health Maintenance Due  Topic Date Due   COVID-19 Vaccine (3 - Booster for [employer] series) 09/09/2019    There are no preventive care reminders to display for this patient.   Lab Results  Component Value Date   TSH 0.79 12/28/2015   Lab Results  Component Value Date   WBC 10.9 (H)  09/16/2020   HGB 12.3 09/16/2020   HCT 38.1 09/16/2020   MCV 81 09/16/2020   PLT 346 09/16/2020   Lab Results  Component Value Date   NA 145 (H) 09/16/2020   K 3.8 09/16/2020   CO2 24 09/16/2020   GLUCOSE 88 09/16/2020   BUN 9 09/16/2020   CREATININE 0.43 (L) 09/16/2020   BILITOT 0.5 09/16/2020   ALKPHOS 75 09/16/2020   AST 24 09/16/2020   ALT 23 09/16/2020   PROT 6.5 09/16/2020   ALBUMIN 4.4 09/16/2020   CALCIUM 9.2 09/16/2020   ANIONGAP 9  11/27/2015   EGFR 133 09/16/2020   Lab Results  Component Value Date   CHOL 180 09/16/2020   Lab Results  Component Value Date   HDL 47 09/16/2020   Lab Results  Component Value Date   LDLCALC 117 (H) 09/16/2020   Lab Results  Component Value Date   TRIG 86 09/16/2020   Lab Results  Component Value Date   CHOLHDL 3.8 09/16/2020   Lab Results  Component Value Date   HGBA1C 5.4 09/16/2020       Assessment & Plan:   Problem List Items Addressed This Visit       Respiratory   Influenza A - Primary    Tamiflu rx.  Recommend rest, fluids, tylenol for symptom mgmt.       Relevant Medications   oseltamivir (TAMIFLU) 75 MG capsule   Meds ordered this encounter  Medications   oseltamivir (TAMIFLU) 75 MG capsule    Sig: Take 1 capsule (75 mg total) by mouth 2 (two) times daily for 5 days.    Dispense:  10 capsule    Refill:  0     Time:  Today, I have spent 7 minutes with the patient with telehealth technology discussing the above problems.    Follow Up:  in person prn   An After Visit Summary was printed and given to the patient.  Rochel Brome, MD [facility] [phone]

## 2020-12-14 NOTE — Assessment & Plan Note (Signed)
Tamiflu rx.  Recommend rest, fluids, tylenol for symptom mgmt.

## 2020-12-22 ENCOUNTER — Ambulatory Visit (INDEPENDENT_AMBULATORY_CARE_PROVIDER_SITE_OTHER): Payer: Medicaid Other | Admitting: Physician Assistant

## 2020-12-22 ENCOUNTER — Encounter: Payer: Self-pay | Admitting: Physician Assistant

## 2020-12-22 VITALS — BP 118/82 | HR 86 | Temp 97.3°F | Ht 63.0 in | Wt 210.0 lb

## 2020-12-22 DIAGNOSIS — N3001 Acute cystitis with hematuria: Secondary | ICD-10-CM

## 2020-12-22 DIAGNOSIS — N3000 Acute cystitis without hematuria: Secondary | ICD-10-CM

## 2020-12-22 DIAGNOSIS — R5381 Other malaise: Secondary | ICD-10-CM

## 2020-12-22 HISTORY — DX: Other malaise: R53.81

## 2020-12-22 HISTORY — DX: Acute cystitis with hematuria: N30.01

## 2020-12-22 LAB — POCT URINALYSIS DIPSTICK
Bilirubin, UA: POSITIVE
Blood, UA: NEGATIVE
Glucose, UA: NEGATIVE
Ketones, UA: POSITIVE
Nitrite, UA: POSITIVE
Protein, UA: POSITIVE — AB
Spec Grav, UA: 1.025 (ref 1.010–1.025)
Urobilinogen, UA: 1 E.U./dL
pH, UA: 6 (ref 5.0–8.0)

## 2020-12-22 LAB — POCT INFLUENZA A/B
Influenza A, POC: NEGATIVE
Influenza B, POC: NEGATIVE

## 2020-12-22 LAB — POCT RAPID STREP A (OFFICE): Rapid Strep A Screen: NEGATIVE

## 2020-12-22 LAB — POC COVID19 BINAXNOW: SARS Coronavirus 2 Ag: NEGATIVE

## 2020-12-22 NOTE — Progress Notes (Signed)
Acute Office Visit  Subjective:    Patient ID: Michelle Lamb, female    DOB: 04-Jun-1990, 30 y.o.   MRN: [id number]  Chief Complaint  Patient presents with   Urinary Tract Infection    HPI: Patient is in today for complaints of low back pain and strong odor to her urine -  denies vaginal symptoms - LMP was 10/1  Pt also complains of having a headache and fatigue for the past week - she was recently empirically treated with tamiflu (her family with positive flu)  Past Medical History:  Diagnosis Date   Benign essential HTN    Diabetes mellitus without complication ([place])    TYPE 2 LAST DOSE FRIDAY OCTOBER 27 (METFORMIN)   History of herpes simplex infection    Hyperlipidemia    Vaginal delivery 2009, 2010, 2015    Past Surgical History:  Procedure Laterality Date   CESAREAN SECTION  09/29/2016   CHOLECYSTECTOMY     DILATION AND CURETTAGE OF UTERUS N/A 12/31/2015   Procedure: SUCTION DILATATION AND CURETTAGE;  Surgeon: Aletha Halim, MD;  Location: [facility];  Service: Gynecology;  Laterality: N/A;   DILATION AND EVACUATION N/A 01/01/2016   Procedure: DILATATION AND EVACUATION;  Surgeon: Jonnie Kind, MD;  Location: [facility];  Service: Gynecology;  Laterality: N/A;   ESOPHAGOGASTRODUODENOSCOPY     GASTRIC BYPASS  06/02/2020   TONSILLECTOMY      Family History  Problem Relation Age of Onset   Hypertension Mother    Cancer Maternal Grandmother        stomach cancer   Depression Maternal Grandfather    Cancer Maternal Grandfather        leukemia   Depression Paternal Grandmother     Social History   Socioeconomic History   Marital status: Married    Spouse name: Not on file   Number of children: 4   Years of education: Not on file   Highest education level: Not on file  Occupational History   Occupation: Physiological scientist  Tobacco Use   Smoking status: Never   Smokeless tobacco: Never  Substance and Sexual Activity   Alcohol use: Yes    Comment:  OCCASIONAL   Drug use: No   Sexual activity: Yes    Birth control/protection: None    Comment: Nexplanon removed early 2017  Other Topics Concern   Not on file  Social History Narrative   Not on file   Social Determinants of Health   Financial Resource Strain: Not on file  Food Insecurity: Not on file  Transportation Needs: Not on file  Physical Activity: Not on file  Stress: Not on file  Social Connections: Not on file  Intimate Partner Violence: Not on file    Outpatient Medications Prior to Visit  Medication Sig Dispense Refill   albuterol (VENTOLIN HFA) 108 (90 Base) MCG/ACT inhaler Inhale 2 puffs into the lungs every 6 (six) hours as needed.     budesonide-formoterol (SYMBICORT) 80-4.5 MCG/ACT inhaler Inhale 2 puffs into the lungs 2 (two) times daily.     cyclobenzaprine (FLEXERIL) 10 MG tablet Take 1 tablet (10 mg total) by mouth 3 (three) times daily as needed for muscle spasms. 30 tablet 0   escitalopram (LEXAPRO) 5 MG tablet Take 5 mg by mouth daily.     gabapentin (NEURONTIN) 100 MG capsule TAKE 1 CAPSULE BY MOUTH THREE TIMES DAILY AS NEEDED     glucose blood (ACCU-CHEK GUIDE) test strip USE 1 STRIP TO CHECK GLUCOSE  THREE TIMES DAILY     lactulose (CHRONULAC) 10 GM/15ML solution SMARTSIG:15 Milliliter(s) By Mouth Every 6 Hours PRN     LORazepam (ATIVAN) 1 MG tablet Take 1 tablet (1 mg total) by mouth daily as needed. 30 tablet 1   omeprazole (PRILOSEC) 20 MG capsule Take twice daily while taking prednisone 30 capsule 0   ondansetron (ZOFRAN-ODT) 4 MG disintegrating tablet Take 1 tablet (4 mg total) by mouth every 8 (eight) hours as needed for nausea or vomiting. 30 tablet 1   promethazine (PHENERGAN) 25 MG tablet Take by mouth.     Semaglutide,0.25 or 0.5MG/DOS, (OZEMPIC, 0.25 OR 0.5 MG/DOSE,) 2 MG/1.5ML SOPN Inject 0.25 mg into the skin once a week. For 4 weeks starting 12/01/2020-12/29/2020. 1.5 mL 0   Vitamin D, Ergocalciferol, (DRISDOL) 1.25 MG (50000 UNIT) CAPS capsule  One pill twice weekly 24 capsule 1   No facility-administered medications prior to visit.    No Known Allergies  Review of Systems CONSTITUTIONAL: Negative for chills, fatigue, fever, unintentional weight gain and unintentional weight loss.  E/N/T:see HPI CARDIOVASCULAR: Negative for chest pain, dizziness, palpitations and pedal edema.  RESPIRATORY: Negative for recent cough and dyspnea.  GASTROINTESTINAL: Negative for abdominal pain, acid reflux symptoms, constipation, diarrhea, nausea and vomiting.  GU - see HPI           Objective:    Physical Exam  BP 118/82   Pulse 86   Temp (!) 97.3 F (36.3 C)   Ht 5' 3"  (1.6 m)   Wt 210 lb (95.3 kg)   SpO2 99%   BMI 37.20 kg/m  Wt Readings from Last 3 Encounters:  12/22/20 210 lb (95.3 kg)  12/14/20 213 lb (96.6 kg)  11/23/20 213 lb (96.6 kg)   PHYSICAL EXAM:   VS: BP 118/82   Pulse 86   Temp (!) 97.3 F (36.3 C)   Ht 5' 3"  (1.6 m)   Wt 210 lb (95.3 kg)   SpO2 99%   BMI 37.20 kg/m   GEN: Well nourished, well developed, in no acute distress  HEENT: normal external ears and nose - normal external auditory canals and TMS -- Lips, Teeth and Gums - normal  Oropharynx - normal mucosa, palate, and posterior pharynx Cardiac: RRR; no murmurs, rubs, or gallops, Respiratory:  normal respiratory rate and pattern with no distress - normal breath sounds with no rales, rhonchi, wheezes or rubs  Office Visit on 12/22/2020  Component Date Value Ref Range Status   Glucose, UA 12/22/2020 Negative  Negative Final   Bilirubin, UA 12/22/2020 positive   Final   Ketones, UA 12/22/2020 positive   Final   Spec Grav, UA 12/22/2020 1.025  1.010 - 1.025 Final   Blood, UA 12/22/2020 neg   Final   pH, UA 12/22/2020 6.0  5.0 - 8.0 Final   Protein, UA 12/22/2020 Positive (A)  Negative Final   Urobilinogen, UA 12/22/2020 1.0  0.2 or 1.0 E.U./dL Final   Nitrite, UA 12/22/2020 positive   Final   Leukocytes, UA 12/22/2020 Moderate (2+) (A)   Negative Final   SARS Coronavirus 2 Ag 12/22/2020 Negative  Negative Final   Influenza A, POC 12/22/2020 Negative  Negative Final   Influenza B, POC 12/22/2020 Negative  Negative Final   Rapid Strep A Screen 12/22/2020 Negative  Negative Final    Health Maintenance Due  Topic Date Due   Pneumococcal Vaccine 56-56 Years old (1 - PCV) Never done   COVID-19 Vaccine (3 - Booster for [employer]  series) 06/07/2019    There are no preventive care reminders to display for this patient.   Lab Results  Component Value Date   TSH 0.79 12/28/2015   Lab Results  Component Value Date   WBC 10.9 (H) 09/16/2020   HGB 12.3 09/16/2020   HCT 38.1 09/16/2020   MCV 81 09/16/2020   PLT 346 09/16/2020   Lab Results  Component Value Date   NA 145 (H) 09/16/2020   K 3.8 09/16/2020   CO2 24 09/16/2020   GLUCOSE 88 09/16/2020   BUN 9 09/16/2020   CREATININE 0.43 (L) 09/16/2020   BILITOT 0.5 09/16/2020   ALKPHOS 75 09/16/2020   AST 24 09/16/2020   ALT 23 09/16/2020   PROT 6.5 09/16/2020   ALBUMIN 4.4 09/16/2020   CALCIUM 9.2 09/16/2020   ANIONGAP 9 11/27/2015   EGFR 133 09/16/2020   Lab Results  Component Value Date   CHOL 180 09/16/2020   Lab Results  Component Value Date   HDL 47 09/16/2020   Lab Results  Component Value Date   LDLCALC 117 (H) 09/16/2020   Lab Results  Component Value Date   TRIG 86 09/16/2020   Lab Results  Component Value Date   CHOLHDL 3.8 09/16/2020   Lab Results  Component Value Date   HGBA1C 5.4 09/16/2020       Assessment & Plan:   Problem List Items Addressed This Visit       Genitourinary   Acute cystitis without hematuria - Primary   Relevant Orders   Urine Culture   POCT urinalysis dipstick (Completed)     Other   Malaise   Relevant Orders   POC COVID-19 (Completed)   POCT Influenza A/B (Completed)   POCT rapid strep A (Completed)   No orders of the defined types were placed in this encounter.   Orders Placed This Encounter   Procedures   Urine Culture   POCT urinalysis dipstick   POC COVID-19   POCT Influenza A/B   POCT rapid strep A     Follow-up: No follow-ups on file.  An After Visit Summary was printed and given to the patient.  Yetta Flock [facility] [phone]

## 2020-12-23 MED ORDER — DOXYCYCLINE HYCLATE 100 MG PO TABS: 100.0000 mg | ORAL_TABLET | Freq: Two times a day (BID) | ORAL | 0 refills | Status: DC

## 2020-12-23 NOTE — Addendum Note (Signed)
Addended byMarge Duncans on: 12/23/2020 03:07 PM   Modules accepted: Orders

## 2020-12-24 ENCOUNTER — Ambulatory Visit (INDEPENDENT_AMBULATORY_CARE_PROVIDER_SITE_OTHER): Payer: Medicaid Other | Admitting: Family Medicine

## 2020-12-24 ENCOUNTER — Encounter: Payer: Self-pay | Admitting: Family Medicine

## 2020-12-24 VITALS — HR 99 | Temp 98.6°F | Ht 63.0 in | Wt 210.0 lb

## 2020-12-24 DIAGNOSIS — E860 Dehydration: Secondary | ICD-10-CM

## 2020-12-24 DIAGNOSIS — R1012 Left upper quadrant pain: Secondary | ICD-10-CM

## 2020-12-24 DIAGNOSIS — N3000 Acute cystitis without hematuria: Secondary | ICD-10-CM | POA: Diagnosis not present

## 2020-12-24 HISTORY — DX: Dehydration: E86.0

## 2020-12-24 HISTORY — DX: Left upper quadrant pain: R10.12

## 2020-12-24 LAB — POCT URINALYSIS DIPSTICK
Blood, UA: NEGATIVE
Glucose, UA: NEGATIVE
Nitrite, UA: NEGATIVE
Protein, UA: POSITIVE — AB
Spec Grav, UA: >=1.03 — AB (ref 1.010–1.025)
Urobilinogen, UA: 1 E.U./dL
pH, UA: 6 (ref 5.0–8.0)

## 2020-12-24 NOTE — Progress Notes (Signed)
Acute Office Visit  Subjective:    Patient ID: Maat Kafer, female    DOB: 13-Aug-1990, 30 y.o.   MRN: [id number]  Chief Complaint  Patient presents with   Abdominal Pain    HPI: Patient is in today for c/o abdominal pain and nausea. Recently started on doxycycline for UTI w/o hematuria. No dysuria. Worsened while here at work. No fever, chills. Seems to radiate to her back.  Past Medical History:  Diagnosis Date   Benign essential HTN    Diabetes mellitus without complication ([place])    TYPE 2 LAST DOSE FRIDAY OCTOBER 27 (METFORMIN)   History of herpes simplex infection    Hyperlipidemia    Vaginal delivery 2009, 2010, 2015    Past Surgical History:  Procedure Laterality Date   CESAREAN SECTION  09/29/2016   CHOLECYSTECTOMY     DILATION AND CURETTAGE OF UTERUS N/A 12/31/2015   Procedure: SUCTION DILATATION AND CURETTAGE;  Surgeon: Aletha Halim, MD;  Location: [facility];  Service: Gynecology;  Laterality: N/A;   DILATION AND EVACUATION N/A 01/01/2016   Procedure: DILATATION AND EVACUATION;  Surgeon: Jonnie Kind, MD;  Location: [facility];  Service: Gynecology;  Laterality: N/A;   ESOPHAGOGASTRODUODENOSCOPY     GASTRIC BYPASS  06/02/2020   TONSILLECTOMY      Family History  Problem Relation Age of Onset   Hypertension Mother    Cancer Maternal Grandmother        stomach cancer   Depression Maternal Grandfather    Cancer Maternal Grandfather        leukemia   Depression Paternal Grandmother     Social History   Socioeconomic History   Marital status: Married    Spouse name: Not on file   Number of children: 4   Years of education: Not on file   Highest education level: Not on file  Occupational History   Occupation: Physiological scientist  Tobacco Use   Smoking status: Never   Smokeless tobacco: Never  Substance and Sexual Activity   Alcohol use: Yes    Comment: OCCASIONAL   Drug use: No   Sexual activity: Yes    Birth control/protection: None     Comment: Nexplanon removed early 2017  Other Topics Concern   Not on file  Social History Narrative   Not on file   Social Determinants of Health   Financial Resource Strain: Not on file  Food Insecurity: Not on file  Transportation Needs: Not on file  Physical Activity: Not on file  Stress: Not on file  Social Connections: Not on file  Intimate Partner Violence: Not on file    Outpatient Medications Prior to Visit  Medication Sig Dispense Refill   albuterol (VENTOLIN HFA) 108 (90 Base) MCG/ACT inhaler Inhale 2 puffs into the lungs every 6 (six) hours as needed.     budesonide-formoterol (SYMBICORT) 80-4.5 MCG/ACT inhaler Inhale 2 puffs into the lungs 2 (two) times daily.     cyclobenzaprine (FLEXERIL) 10 MG tablet Take 1 tablet (10 mg total) by mouth 3 (three) times daily as needed for muscle spasms. 30 tablet 0   doxycycline (VIBRA-TABS) 100 MG tablet Take 1 tablet (100 mg total) by mouth 2 (two) times daily. 20 tablet 0   escitalopram (LEXAPRO) 5 MG tablet Take 5 mg by mouth daily.     gabapentin (NEURONTIN) 100 MG capsule TAKE 1 CAPSULE BY MOUTH THREE TIMES DAILY AS NEEDED     glucose blood (ACCU-CHEK GUIDE) test strip USE 1 STRIP  TO CHECK GLUCOSE THREE TIMES DAILY     lactulose (CHRONULAC) 10 GM/15ML solution SMARTSIG:15 Milliliter(s) By Mouth Every 6 Hours PRN     LORazepam (ATIVAN) 1 MG tablet Take 1 tablet (1 mg total) by mouth daily as needed. 30 tablet 1   omeprazole (PRILOSEC) 20 MG capsule Take twice daily while taking prednisone 30 capsule 0   ondansetron (ZOFRAN-ODT) 4 MG disintegrating tablet Take 1 tablet (4 mg total) by mouth every 8 (eight) hours as needed for nausea or vomiting. 30 tablet 1   promethazine (PHENERGAN) 25 MG tablet Take by mouth.     Semaglutide,0.25 or 0.5MG/DOS, (OZEMPIC, 0.25 OR 0.5 MG/DOSE,) 2 MG/1.5ML SOPN Inject 0.25 mg into the skin once a week. For 4 weeks starting 12/01/2020-12/29/2020. 1.5 mL 0   Vitamin D, Ergocalciferol, (DRISDOL) 1.25 MG  (50000 UNIT) CAPS capsule One pill twice weekly 24 capsule 1   No facility-administered medications prior to visit.    No Known Allergies  Review of Systems  Constitutional:  Negative for chills and fever.  HENT:  Negative for congestion and sore throat.   Respiratory:  Negative for cough and shortness of breath.   Cardiovascular:  Negative for chest pain.  Gastrointestinal:  Positive for abdominal pain and nausea.  Genitourinary:  Negative for dysuria, flank pain, frequency and urgency.  Musculoskeletal:  Positive for back pain.      Objective:    Physical Exam Vitals reviewed.  Constitutional:      Appearance: Normal appearance. She is well-developed and normal weight.  Neck:     Vascular: No carotid bruit.  Cardiovascular:     Rate and Rhythm: Normal rate and regular rhythm.     Pulses: Normal pulses.     Heart sounds: Normal heart sounds.  Pulmonary:     Effort: Pulmonary effort is normal. No respiratory distress.     Breath sounds: Normal breath sounds.  Abdominal:     General: Abdomen is flat. Bowel sounds are normal.     Palpations: Abdomen is soft.     Tenderness: There is abdominal tenderness in the periumbilical area and left upper quadrant. There is no right CVA tenderness or left CVA tenderness.  Neurological:     Mental Status: She is alert and oriented to person, place, and time.  Psychiatric:        Mood and Affect: Mood normal.        Behavior: Behavior normal.    Pulse 99   Temp 98.6 F (37 C)   Ht _0  (1.6 m)   Wt 210 lb (95.3 kg)   SpO2 98%   BMI 37.20 kg/m  Wt Readings from Last 3 Encounters:  12/24/20 210 lb (95.3 kg)  12/22/20 210 lb (95.3 kg)  12/14/20 213 lb (96.6 kg)    Health Maintenance Due  Topic Date Due   Pneumococcal Vaccine 70-97 Years old (1 - PCV) Never done    There are no preventive care reminders to display for this patient.   Lab Results  Component Value Date   TSH 0.79 12/28/2015   Lab Results  Component  Value Date   WBC 10.9 (H) 09/16/2020   HGB 12.3 09/16/2020   HCT 38.1 09/16/2020   MCV 81 09/16/2020   PLT 346 09/16/2020   Lab Results  Component Value Date   NA 145 (H) 09/16/2020   K 3.8 09/16/2020   CO2 24 09/16/2020   GLUCOSE 88 09/16/2020   BUN 9 09/16/2020   CREATININE 0.43 (L)  09/16/2020   BILITOT 0.5 09/16/2020   ALKPHOS 75 09/16/2020   AST 24 09/16/2020   ALT 23 09/16/2020   PROT 6.5 09/16/2020   ALBUMIN 4.4 09/16/2020   CALCIUM 9.2 09/16/2020   ANIONGAP 9 11/27/2015   EGFR 133 09/16/2020   Lab Results  Component Value Date   CHOL 180 09/16/2020   Lab Results  Component Value Date   HDL 47 09/16/2020   Lab Results  Component Value Date   LDLCALC 117 (H) 09/16/2020   Lab Results  Component Value Date   TRIG 86 09/16/2020   Lab Results  Component Value Date   CHOLHDL 3.8 09/16/2020   Lab Results  Component Value Date   HGBA1C 5.4 09/16/2020       Assessment & Plan:   Problem List Items Addressed This Visit       Genitourinary   Acute cystitis without hematuria - Primary    Complete doxycycline.  Trace leuks. Culture pending       Relevant Orders   POCT urinalysis dipstick (Completed)     Other   LUQ abdominal pain     GI cocktail given. Helped pain, but made her feel dizzy      Dehydration    Increase oral hydration.  Pt called her bariatric surgeon and is going to his office tomorrow for ivfs and evaluations.      No orders of the defined types were placed in this encounter.   Orders Placed This Encounter  Procedures   POCT urinalysis dipstick     Follow-up: No follow-ups on file.  An After Visit Summary was printed and given to the patient.  Rochel Brome, MD [facility] [phone]

## 2020-12-24 NOTE — Assessment & Plan Note (Signed)
Increase oral hydration.  Pt called her bariatric surgeon and is going to his office tomorrow for ivfs and evaluations.

## 2020-12-24 NOTE — Assessment & Plan Note (Signed)
  GI cocktail given. Helped pain, but made her feel dizzy

## 2020-12-24 NOTE — Assessment & Plan Note (Signed)
Complete doxycycline.  Trace leuks. Culture pending

## 2020-12-29 ENCOUNTER — Other Ambulatory Visit: Payer: Self-pay | Admitting: Physician Assistant

## 2020-12-29 DIAGNOSIS — N3000 Acute cystitis without hematuria: Secondary | ICD-10-CM

## 2020-12-29 LAB — URINE CULTURE

## 2020-12-29 MED ORDER — NITROFURANTOIN MONOHYD MACRO 100 MG PO CAPS: 100.0000 mg | ORAL_CAPSULE | Freq: Two times a day (BID) | ORAL | 0 refills | Status: DC

## 2021-01-08 ENCOUNTER — Other Ambulatory Visit: Payer: Self-pay

## 2021-01-08 DIAGNOSIS — R112 Nausea with vomiting, unspecified: Secondary | ICD-10-CM

## 2021-01-08 DIAGNOSIS — F418 Other specified anxiety disorders: Secondary | ICD-10-CM

## 2021-01-08 MED ORDER — LORAZEPAM 1 MG PO TABS: 1.0000 mg | ORAL_TABLET | Freq: Every day | ORAL | 1 refills | Status: DC | PRN

## 2021-01-08 MED ORDER — ONDANSETRON 4 MG PO TBDP: 4.0000 mg | ORAL_TABLET | Freq: Three times a day (TID) | ORAL | 1 refills | Status: DC | PRN

## 2021-01-11 ENCOUNTER — Encounter: Payer: Self-pay | Admitting: Physician Assistant

## 2021-01-11 ENCOUNTER — Ambulatory Visit (INDEPENDENT_AMBULATORY_CARE_PROVIDER_SITE_OTHER): Payer: Medicaid Other | Admitting: Physician Assistant

## 2021-01-11 VITALS — BP 120/80 | HR 94 | Temp 98.6°F | Ht 63.0 in | Wt 209.6 lb

## 2021-01-11 DIAGNOSIS — J060 Acute laryngopharyngitis: Secondary | ICD-10-CM

## 2021-01-11 LAB — POC INFLUENZA A&B (BINAX/QUICKVUE)
Influenza A, POC: NEGATIVE
Influenza B, POC: NEGATIVE

## 2021-01-11 LAB — POC COVID19 BINAXNOW: SARS Coronavirus 2 Ag: NEGATIVE

## 2021-01-11 LAB — POCT RAPID STREP A (OFFICE): Rapid Strep A Screen: NEGATIVE

## 2021-01-11 MED ORDER — HYDROCODONE BIT-HOMATROP MBR 5-1.5 MG/5ML PO SOLN: 5.0000 mL | Freq: Four times a day (QID) | ORAL | 0 refills | Status: DC | PRN

## 2021-01-11 MED ORDER — AZITHROMYCIN 250 MG PO TABS: ORAL_TABLET | ORAL | 0 refills | Status: AC

## 2021-01-11 NOTE — Progress Notes (Signed)
Acute Office Visit  Subjective:    Patient ID: Michelle Lamb, female    DOB: May 18, 1990, 30 y.o.   MRN: [id number]  Chief Complaint  Patient presents with   Cough   Sore Throat    HPI: Patient is in today for complaints of malaise, cough and congestion - has felt symptoms for 2 days Has had sore throat and cough has been productive - family with similar symptoms Denies fever - has had mild headache  Past Medical History:  Diagnosis Date   Benign essential HTN    Diabetes mellitus without complication ([place])    TYPE 2 LAST DOSE FRIDAY OCTOBER 27 (METFORMIN)   History of herpes simplex infection    Hyperlipidemia    Vaginal delivery 2009, 2010, 2015    Past Surgical History:  Procedure Laterality Date   CESAREAN SECTION  09/29/2016   CHOLECYSTECTOMY     DILATION AND CURETTAGE OF UTERUS N/A 12/31/2015   Procedure: SUCTION DILATATION AND CURETTAGE;  Surgeon: Aletha Halim, MD;  Location: [facility];  Service: Gynecology;  Laterality: N/A;   DILATION AND EVACUATION N/A 01/01/2016   Procedure: DILATATION AND EVACUATION;  Surgeon: Jonnie Kind, MD;  Location: [facility];  Service: Gynecology;  Laterality: N/A;   ESOPHAGOGASTRODUODENOSCOPY     GASTRIC BYPASS  06/02/2020   TONSILLECTOMY      Family History  Problem Relation Age of Onset   Hypertension Mother    Cancer Maternal Grandmother        stomach cancer   Depression Maternal Grandfather    Cancer Maternal Grandfather        leukemia   Depression Paternal Grandmother     Social History   Socioeconomic History   Marital status: Married    Spouse name: Not on file   Number of children: 4   Years of education: Not on file   Highest education level: Not on file  Occupational History   Occupation: Physiological scientist  Tobacco Use   Smoking status: Never   Smokeless tobacco: Never  Substance and Sexual Activity   Alcohol use: Yes    Comment: OCCASIONAL   Drug use: No   Sexual activity: Yes    Birth  control/protection: None    Comment: Nexplanon removed early 2017  Other Topics Concern   Not on file  Social History Narrative   Not on file   Social Determinants of Health   Financial Resource Strain: Not on file  Food Insecurity: Not on file  Transportation Needs: Not on file  Physical Activity: Not on file  Stress: Not on file  Social Connections: Not on file  Intimate Partner Violence: Not on file    Outpatient Medications Prior to Visit  Medication Sig Dispense Refill   albuterol (VENTOLIN HFA) 108 (90 Base) MCG/ACT inhaler Inhale 2 puffs into the lungs every 6 (six) hours as needed.     budesonide-formoterol (SYMBICORT) 80-4.5 MCG/ACT inhaler Inhale 2 puffs into the lungs 2 (two) times daily.     cyclobenzaprine (FLEXERIL) 10 MG tablet Take 1 tablet (10 mg total) by mouth 3 (three) times daily as needed for muscle spasms. 30 tablet 0   gabapentin (NEURONTIN) 100 MG capsule TAKE 1 CAPSULE BY MOUTH THREE TIMES DAILY AS NEEDED     lactulose (CHRONULAC) 10 GM/15ML solution SMARTSIG:15 Milliliter(s) By Mouth Every 6 Hours PRN     LORazepam (ATIVAN) 1 MG tablet Take 1 tablet (1 mg total) by mouth daily as needed. 30 tablet 1  omeprazole (PRILOSEC) 20 MG capsule Take twice daily while taking prednisone 30 capsule 0   ondansetron (ZOFRAN-ODT) 4 MG disintegrating tablet Take 1 tablet (4 mg total) by mouth every 8 (eight) hours as needed for nausea or vomiting. 30 tablet 1   promethazine (PHENERGAN) 25 MG tablet Take by mouth.     Semaglutide,0.25 or 0.5MG/DOS, (OZEMPIC, 0.25 OR 0.5 MG/DOSE,) 2 MG/1.5ML SOPN Inject 0.25 mg into the skin once a week. For 4 weeks starting 12/01/2020-12/29/2020. 1.5 mL 0   Vitamin D, Ergocalciferol, (DRISDOL) 1.25 MG (50000 UNIT) CAPS capsule One pill twice weekly 24 capsule 1   doxycycline (VIBRA-TABS) 100 MG tablet Take 1 tablet (100 mg total) by mouth 2 (two) times daily. 20 tablet 0   escitalopram (LEXAPRO) 5 MG tablet Take 5 mg by mouth daily.      glucose blood (ACCU-CHEK GUIDE) test strip USE 1 STRIP TO CHECK GLUCOSE THREE TIMES DAILY     nitrofurantoin, macrocrystal-monohydrate, (MACROBID) 100 MG capsule Take 1 capsule (100 mg total) by mouth 2 (two) times daily. 20 capsule 0   No facility-administered medications prior to visit.    No Known Allergies  Review of Systems CONSTITUTIONAL: Negative for chills, fatigue, fever, unintentional weight gain and unintentional weight loss.  E/N/T: see HPI CARDIOVASCULAR: Negative for chest pain, dizziness, palpitations and pedal edema.  RESPIRATORY: see HPI INTEGUMENTARY: Negative for rash.  NEUROLOGICAL: Negative for dizziness and headaches.  PSYCHIATRIC: Negative for sleep disturbance and to question depression screen.  Negative for depression, negative for anhedonia.         Objective:    Physical Exam PHYSICAL EXAM:   VS: BP 120/80 (BP Location: Left Arm, Patient Position: Sitting, Cuff Size: Large)   Pulse 94   Temp 98.6 F (37 C) (Temporal)   Ht _0  (1.6 m)   Wt 209 lb 9.6 oz (95.1 kg)   SpO2 99%   BMI 37.13 kg/m   GEN: Well nourished, well developed, in no acute distress  HEENT: normal external ears and nose - normal external auditory canals and TMS - - Lips, Teeth and Gums - normal  Oropharynx - erythema and pnd noted Cardiac: RRR; no murmurs, Respiratory:  normal respiratory rate and pattern with no distress - normal breath sounds with no rales, rhonchi, wheezes or rubs - rare exp rhonchi - clears with cough Skin: warm and dry, no rash   Office Visit on 01/11/2021  Component Date Value Ref Range Status   SARS Coronavirus 2 Ag 01/11/2021 Negative  Negative Final   Influenza A, POC 01/11/2021 Negative  Negative Final   Influenza B, POC 01/11/2021 Negative  Negative Final   Rapid Strep A Screen 01/11/2021 Negative  Negative Final    BP 120/80 (BP Location: Left Arm, Patient Position: Sitting, Cuff Size: Large)   Pulse 94   Temp 98.6 F (37 C) (Temporal)   Ht  _1  (1.6 m)   Wt 209 lb 9.6 oz (95.1 kg)   SpO2 99%   BMI 37.13 kg/m  Wt Readings from Last 3 Encounters:  01/11/21 209 lb 9.6 oz (95.1 kg)  12/24/20 210 lb (95.3 kg)  12/22/20 210 lb (95.3 kg)    Health Maintenance Due  Topic Date Due   Pneumococcal Vaccine 69-44 Years old (1 - PCV) Never done    There are no preventive care reminders to display for this patient.   Lab Results  Component Value Date   TSH 0.79 12/28/2015   Lab Results  Component Value  Date   WBC 10.9 (H) 09/16/2020   HGB 12.3 09/16/2020   HCT 38.1 09/16/2020   MCV 81 09/16/2020   PLT 346 09/16/2020   Lab Results  Component Value Date   NA 145 (H) 09/16/2020   K 3.8 09/16/2020   CO2 24 09/16/2020   GLUCOSE 88 09/16/2020   BUN 9 09/16/2020   CREATININE 0.43 (L) 09/16/2020   BILITOT 0.5 09/16/2020   ALKPHOS 75 09/16/2020   AST 24 09/16/2020   ALT 23 09/16/2020   PROT 6.5 09/16/2020   ALBUMIN 4.4 09/16/2020   CALCIUM 9.2 09/16/2020   ANIONGAP 9 11/27/2015   EGFR 133 09/16/2020   Lab Results  Component Value Date   CHOL 180 09/16/2020   Lab Results  Component Value Date   HDL 47 09/16/2020   Lab Results  Component Value Date   LDLCALC 117 (H) 09/16/2020   Lab Results  Component Value Date   TRIG 86 09/16/2020   Lab Results  Component Value Date   CHOLHDL 3.8 09/16/2020   Lab Results  Component Value Date   HGBA1C 5.4 09/16/2020       Assessment & Plan:   Problem List Items Addressed This Visit   None Visit Diagnoses     Acute laryngopharyngitis    -  Primary   Relevant Medications   azithromycin (ZITHROMAX) 250 MG tablet   HYDROcodone bit-homatropine (HYDROMET) 5-1.5 MG/5ML syrup   Other Relevant Orders   POC COVID-19 BinaxNow (Completed)   POC Influenza A&B(BINAX/QUICKVUE) (Completed)   POCT rapid strep A (Completed)      Meds ordered this encounter  Medications   azithromycin (ZITHROMAX) 250 MG tablet    Sig: Take 2 tablets on day 1, then 1 tablet daily on  days 2 through 5    Dispense:  6 tablet    Refill:  0    Order Specific Question:   Supervising Provider    Answer:   COX, Elnita Maxwell [[id number]]   HYDROcodone bit-homatropine (HYDROMET) 5-1.5 MG/5ML syrup    Sig: Take 5 mLs by mouth every 6 (six) hours as needed for cough.    Dispense:  120 mL    Refill:  0    Order Specific Question:   Supervising Provider    AnswerShelton Silvas    Orders Placed This Encounter  Procedures   POC COVID-19 BinaxNow   POC Influenza A&B(BINAX/QUICKVUE)   POCT rapid strep A     Follow-up: Return if symptoms worsen or fail to improve.  An After Visit Summary was printed and given to the patient.  Yetta Flock [facility] [phone]

## 2021-01-12 ENCOUNTER — Other Ambulatory Visit: Payer: Self-pay | Admitting: Nurse Practitioner

## 2021-01-12 DIAGNOSIS — Z20828 Contact with and (suspected) exposure to other viral communicable diseases: Secondary | ICD-10-CM

## 2021-01-12 MED ORDER — OSELTAMIVIR PHOSPHATE 75 MG PO CAPS: 75.0000 mg | ORAL_CAPSULE | Freq: Two times a day (BID) | ORAL | 0 refills | Status: DC

## 2021-01-12 MED ORDER — XOFLUZA (80 MG DOSE) 1 X 80 MG PO TBPK: 80.0000 mg | ORAL_TABLET | Freq: Once | ORAL | 0 refills | Status: AC

## 2021-01-13 ENCOUNTER — Ambulatory Visit (INDEPENDENT_AMBULATORY_CARE_PROVIDER_SITE_OTHER): Payer: Medicaid Other | Admitting: Nurse Practitioner

## 2021-01-13 ENCOUNTER — Encounter: Payer: Self-pay | Admitting: Nurse Practitioner

## 2021-01-13 VITALS — BP 126/72 | HR 96 | Temp 98.9°F | Ht 63.0 in | Wt 205.0 lb

## 2021-01-13 DIAGNOSIS — J101 Influenza due to other identified influenza virus with other respiratory manifestations: Secondary | ICD-10-CM

## 2021-01-13 DIAGNOSIS — R0981 Nasal congestion: Secondary | ICD-10-CM | POA: Diagnosis not present

## 2021-01-13 DIAGNOSIS — Z20828 Contact with and (suspected) exposure to other viral communicable diseases: Secondary | ICD-10-CM | POA: Diagnosis not present

## 2021-01-13 LAB — POCT INFLUENZA A/B
Influenza A, POC: POSITIVE — AB
Influenza B, POC: NEGATIVE

## 2021-01-13 LAB — POCT RAPID STREP A (OFFICE): Rapid Strep A Screen: NEGATIVE

## 2021-01-13 NOTE — Progress Notes (Signed)
Acute Office Visit  Subjective:    Patient ID: Michelle Lamb, female    DOB: 1990-10-26, 30 y.o.   MRN: [id number]  CC: Sinus congestion   HPI Patient is in today for upper respiratory symptoms of sore throat, body aches, and cough. Onset of symptom was two days ago. Treatment has included Mucinex and cough syrup.States two of her children had positive flu A tests yesterday.   Past Medical History:  Diagnosis Date   Benign essential HTN    Diabetes mellitus without complication ([place])    TYPE 2 LAST DOSE FRIDAY OCTOBER 27 (METFORMIN)   History of herpes simplex infection    Hyperlipidemia    Vaginal delivery 2009, 2010, 2015    Past Surgical History:  Procedure Laterality Date   CESAREAN SECTION  09/29/2016   CHOLECYSTECTOMY     DILATION AND CURETTAGE OF UTERUS N/A 12/31/2015   Procedure: SUCTION DILATATION AND CURETTAGE;  Surgeon: Aletha Halim, MD;  Location: [facility];  Service: Gynecology;  Laterality: N/A;   DILATION AND EVACUATION N/A 01/01/2016   Procedure: DILATATION AND EVACUATION;  Surgeon: Jonnie Kind, MD;  Location: [facility];  Service: Gynecology;  Laterality: N/A;   ESOPHAGOGASTRODUODENOSCOPY     GASTRIC BYPASS  06/02/2020   TONSILLECTOMY      Family History  Problem Relation Age of Onset   Hypertension Mother    Cancer Maternal Grandmother        stomach cancer   Depression Maternal Grandfather    Cancer Maternal Grandfather        leukemia   Depression Paternal Grandmother     Social History   Socioeconomic History   Marital status: Married    Spouse name: Not on file   Number of children: 4   Years of education: Not on file   Highest education level: Not on file  Occupational History   Occupation: Physiological scientist  Tobacco Use   Smoking status: Never   Smokeless tobacco: Never  Substance and Sexual Activity   Alcohol use: Yes    Comment: OCCASIONAL   Drug use: No   Sexual activity: Yes    Birth control/protection: None     Comment: Nexplanon removed early 2017  Other Topics Concern   Not on file  Social History Narrative   Not on file   Social Determinants of Health   Financial Resource Strain: Not on file  Food Insecurity: Not on file  Transportation Needs: Not on file  Physical Activity: Not on file  Stress: Not on file  Social Connections: Not on file  Intimate Partner Violence: Not on file    Outpatient Medications Prior to Visit  Medication Sig Dispense Refill   albuterol (VENTOLIN HFA) 108 (90 Base) MCG/ACT inhaler Inhale 2 puffs into the lungs every 6 (six) hours as needed.     azithromycin (ZITHROMAX) 250 MG tablet Take 2 tablets on day 1, then 1 tablet daily on days 2 through 5 6 tablet 0   budesonide-formoterol (SYMBICORT) 80-4.5 MCG/ACT inhaler Inhale 2 puffs into the lungs 2 (two) times daily.     cyclobenzaprine (FLEXERIL) 10 MG tablet Take 1 tablet (10 mg total) by mouth 3 (three) times daily as needed for muscle spasms. 30 tablet 0   gabapentin (NEURONTIN) 100 MG capsule TAKE 1 CAPSULE BY MOUTH THREE TIMES DAILY AS NEEDED     HYDROcodone bit-homatropine (HYDROMET) 5-1.5 MG/5ML syrup Take 5 mLs by mouth every 6 (six) hours as needed for cough. 120 mL 0  lactulose (CHRONULAC) 10 GM/15ML solution SMARTSIG:15 Milliliter(s) By Mouth Every 6 Hours PRN     LORazepam (ATIVAN) 1 MG tablet Take 1 tablet (1 mg total) by mouth daily as needed. 30 tablet 1   omeprazole (PRILOSEC) 20 MG capsule Take twice daily while taking prednisone 30 capsule 0   ondansetron (ZOFRAN-ODT) 4 MG disintegrating tablet Take 1 tablet (4 mg total) by mouth every 8 (eight) hours as needed for nausea or vomiting. 30 tablet 1   oseltamivir (TAMIFLU) 75 MG capsule Take 1 capsule (75 mg total) by mouth 2 (two) times daily. 10 capsule 0   promethazine (PHENERGAN) 25 MG tablet Take by mouth.     Semaglutide,0.25 or 0.5MG/DOS, (OZEMPIC, 0.25 OR 0.5 MG/DOSE,) 2 MG/1.5ML SOPN Inject 0.25 mg into the skin once a week. For 4 weeks  starting 12/01/2020-12/29/2020. 1.5 mL 0   Vitamin D, Ergocalciferol, (DRISDOL) 1.25 MG (50000 UNIT) CAPS capsule One pill twice weekly 24 capsule 1   No facility-administered medications prior to visit.    No Known Allergies  Review of Systems  Constitutional:  Positive for appetite change (decreased), chills, fatigue and fever.  HENT:  Positive for postnasal drip, rhinorrhea, sinus pain and sore throat.   Eyes: Negative.   Respiratory:  Positive for cough.   Cardiovascular: Negative.   Gastrointestinal:  Positive for nausea.  Endocrine: Negative.   Musculoskeletal:  Positive for arthralgias and myalgias.       Generalized body aches  Skin: Negative.   Neurological:  Positive for headaches.  Hematological: Negative.   Psychiatric/Behavioral: Negative.        Objective:    Physical Exam Vitals reviewed.  Constitutional:      Appearance: She is ill-appearing.  HENT:     Right Ear: Tympanic membrane normal.     Left Ear: Tympanic membrane normal.     Nose: Congestion and rhinorrhea present.     Mouth/Throat:     Pharynx: Posterior oropharyngeal erythema present.  Cardiovascular:     Rate and Rhythm: Normal rate and regular rhythm.  Pulmonary:     Effort: Pulmonary effort is normal.     Breath sounds: Normal breath sounds.  Abdominal:     General: Bowel sounds are normal.     Palpations: Abdomen is soft.  Skin:    General: Skin is warm and dry.     Capillary Refill: Capillary refill takes less than 2 seconds.  Neurological:     General: No focal deficit present.     Mental Status: She is alert and oriented to person, place, and time.  Psychiatric:        Mood and Affect: Mood normal.        Behavior: Behavior normal.    BP 126/72   Pulse 96   Temp 98.9 F (37.2 C)   Ht 5' 3"  (1.6 m)   Wt 205 lb (93 kg)   SpO2 99%   BMI 36.31 kg/m   Wt Readings from Last 3 Encounters:  01/11/21 209 lb 9.6 oz (95.1 kg)  12/24/20 210 lb (95.3 kg)  12/22/20 210 lb (95.3 kg)     Health Maintenance Due  Topic Date Due   Pneumococcal Vaccine 96-57 Years old (1 - PCV) Never done       Lab Results  Component Value Date   TSH 0.79 12/28/2015   Lab Results  Component Value Date   WBC 10.9 (H) 09/16/2020   HGB 12.3 09/16/2020   HCT 38.1 09/16/2020   MCV 81  09/16/2020   PLT 346 09/16/2020   Lab Results  Component Value Date   NA 145 (H) 09/16/2020   K 3.8 09/16/2020   CO2 24 09/16/2020   GLUCOSE 88 09/16/2020   BUN 9 09/16/2020   CREATININE 0.43 (L) 09/16/2020   BILITOT 0.5 09/16/2020   ALKPHOS 75 09/16/2020   AST 24 09/16/2020   ALT 23 09/16/2020   PROT 6.5 09/16/2020   ALBUMIN 4.4 09/16/2020   CALCIUM 9.2 09/16/2020   ANIONGAP 9 11/27/2015   EGFR 133 09/16/2020   Lab Results  Component Value Date   CHOL 180 09/16/2020   Lab Results  Component Value Date   HDL 47 09/16/2020   Lab Results  Component Value Date   LDLCALC 117 (H) 09/16/2020   Lab Results  Component Value Date   TRIG 86 09/16/2020   Lab Results  Component Value Date   CHOLHDL 3.8 09/16/2020   Lab Results  Component Value Date   HGBA1C 5.4 09/16/2020       Assessment & Plan:   1. Influenza A -rest and push fluids  2. Exposure to influenza  3. Sinus congestion - POCT rapid strep A - Influenza A/B   Follow-up: As needed  I, Rip Harbour, NP, have reviewed all documentation for this visit. The documentation on 01/13/21 for the exam, diagnosis, procedures, and orders are all accurate and complete.    Signed, Rip Harbour, NP

## 2021-01-14 ENCOUNTER — Ambulatory Visit: Payer: Medicaid Other | Admitting: Nurse Practitioner

## 2021-02-08 ENCOUNTER — Encounter: Payer: Self-pay | Admitting: Nurse Practitioner

## 2021-02-08 ENCOUNTER — Ambulatory Visit (INDEPENDENT_AMBULATORY_CARE_PROVIDER_SITE_OTHER): Payer: Medicaid Other | Admitting: Nurse Practitioner

## 2021-02-08 VITALS — BP 122/80 | HR 75 | Temp 96.9°F | Ht 63.0 in | Wt 200.0 lb

## 2021-02-08 DIAGNOSIS — D513 Other dietary vitamin B12 deficiency anemia: Secondary | ICD-10-CM

## 2021-02-08 DIAGNOSIS — Z8619 Personal history of other infectious and parasitic diseases: Secondary | ICD-10-CM

## 2021-02-08 DIAGNOSIS — N3001 Acute cystitis with hematuria: Secondary | ICD-10-CM | POA: Diagnosis not present

## 2021-02-08 DIAGNOSIS — R300 Dysuria: Secondary | ICD-10-CM | POA: Diagnosis not present

## 2021-02-08 LAB — POCT URINALYSIS DIPSTICK
Glucose, UA: NEGATIVE
Nitrite, UA: POSITIVE
Protein, UA: POSITIVE — AB
Spec Grav, UA: >=1.03 — AB (ref 1.010–1.025)
Urobilinogen, UA: 1 E.U./dL
pH, UA: 6 (ref 5.0–8.0)

## 2021-02-08 MED ORDER — CYANOCOBALAMIN 1000 MCG/ML IJ KIT: 1.0000 mL | PACK | INTRAMUSCULAR | 1 refills | Status: DC

## 2021-02-08 MED ORDER — SULFAMETHOXAZOLE-TRIMETHOPRIM 800-160 MG PO TABS: 1.0000 | ORAL_TABLET | Freq: Two times a day (BID) | ORAL | 0 refills | Status: DC

## 2021-02-08 MED ORDER — FLUCONAZOLE 150 MG PO TABS: 150.0000 mg | ORAL_TABLET | Freq: Once | ORAL | 0 refills | Status: AC

## 2021-02-08 NOTE — Progress Notes (Signed)
Acute Office Visit  Subjective:    Patient ID: Michelle Lamb, female    DOB: 1990/06/02, 30 y.o.   MRN: [id number]  CC Hematuria  HPI Patient is in today for urinary symptoms of dysuria, hematuria, and back pain. Onset of symptoms was approximately one week ago. Treatment has included previous Bactrim prescription she had at home and pushing fluids.  States symptoms have not improved.She has a known left renal cyst that she has a pending partial nephrectomy. She has been treated with a course of Macrobid and Doxycycline over the past 8 weeks.   Past Medical History:  Diagnosis Date   Benign essential HTN    Diabetes mellitus without complication ([place])    TYPE 2 LAST DOSE FRIDAY OCTOBER 27 (METFORMIN)   History of herpes simplex infection    Hyperlipidemia    Vaginal delivery 2009, 2010, 2015    Past Surgical History:  Procedure Laterality Date   CESAREAN SECTION  09/29/2016   CHOLECYSTECTOMY     DILATION AND CURETTAGE OF UTERUS N/A 12/31/2015   Procedure: SUCTION DILATATION AND CURETTAGE;  Surgeon: Aletha Halim, MD;  Location: [facility];  Service: Gynecology;  Laterality: N/A;   DILATION AND EVACUATION N/A 01/01/2016   Procedure: DILATATION AND EVACUATION;  Surgeon: Jonnie Kind, MD;  Location: [facility];  Service: Gynecology;  Laterality: N/A;   ESOPHAGOGASTRODUODENOSCOPY     GASTRIC BYPASS  06/02/2020   TONSILLECTOMY      Family History  Problem Relation Age of Onset   Hypertension Mother    Cancer Maternal Grandmother        stomach cancer   Depression Maternal Grandfather    Cancer Maternal Grandfather        leukemia   Depression Paternal Grandmother     Social History   Socioeconomic History   Marital status: Married    Spouse name: Not on file   Number of children: 4   Years of education: Not on file   Highest education level: Not on file  Occupational History   Occupation: Physiological scientist  Tobacco Use   Smoking status: Never   Smokeless  tobacco: Never  Substance and Sexual Activity   Alcohol use: Yes    Comment: OCCASIONAL   Drug use: No   Sexual activity: Yes    Birth control/protection: None    Comment: Nexplanon removed early 2017  Other Topics Concern   Not on file  Social History Narrative   Not on file   Social Determinants of Health   Financial Resource Strain: Not on file  Food Insecurity: Not on file  Transportation Needs: Not on file  Physical Activity: Not on file  Stress: Not on file  Social Connections: Not on file  Intimate Partner Violence: Not on file    Outpatient Medications Prior to Visit  Medication Sig Dispense Refill   albuterol (VENTOLIN HFA) 108 (90 Base) MCG/ACT inhaler Inhale 2 puffs into the lungs every 6 (six) hours as needed.     budesonide-formoterol (SYMBICORT) 80-4.5 MCG/ACT inhaler Inhale 2 puffs into the lungs 2 (two) times daily.     cyclobenzaprine (FLEXERIL) 10 MG tablet Take 1 tablet (10 mg total) by mouth 3 (three) times daily as needed for muscle spasms. 30 tablet 0   gabapentin (NEURONTIN) 100 MG capsule TAKE 1 CAPSULE BY MOUTH THREE TIMES DAILY AS NEEDED     HYDROcodone bit-homatropine (HYDROMET) 5-1.5 MG/5ML syrup Take 5 mLs by mouth every 6 (six) hours as needed for cough. Powers  mL 0   lactulose (CHRONULAC) 10 GM/15ML solution SMARTSIG:15 Milliliter(s) By Mouth Every 6 Hours PRN     LORazepam (ATIVAN) 1 MG tablet Take 1 tablet (1 mg total) by mouth daily as needed. 30 tablet 1   omeprazole (PRILOSEC) 20 MG capsule Take twice daily while taking prednisone 30 capsule 0   ondansetron (ZOFRAN-ODT) 4 MG disintegrating tablet Take 1 tablet (4 mg total) by mouth every 8 (eight) hours as needed for nausea or vomiting. 30 tablet 1   oseltamivir (TAMIFLU) 75 MG capsule Take 1 capsule (75 mg total) by mouth 2 (two) times daily. 10 capsule 0   promethazine (PHENERGAN) 25 MG tablet Take by mouth.     Semaglutide,0.25 or 0.5MG/DOS, (OZEMPIC, 0.25 OR 0.5 MG/DOSE,) 2 MG/1.5ML SOPN  Inject 0.25 mg into the skin once a week. For 4 weeks starting 12/01/2020-12/29/2020. 1.5 mL 0   Vitamin D, Ergocalciferol, (DRISDOL) 1.25 MG (50000 UNIT) CAPS capsule One pill twice weekly 24 capsule 1   No facility-administered medications prior to visit.    No Known Allergies  Review of Systems  Constitutional:  Positive for fatigue.  Gastrointestinal:  Positive for abdominal pain.  Genitourinary:  Positive for dysuria and hematuria.  Musculoskeletal:  Positive for back pain (left lower back).  All other systems reviewed and are negative.     Objective:    Physical Exam Vitals reviewed.  Pulmonary:     Effort: Pulmonary effort is normal.  Abdominal:     Tenderness: There is no guarding.  Skin:    General: Skin is warm and dry.     Capillary Refill: Capillary refill takes less than 2 seconds.  Neurological:     General: No focal deficit present.     Mental Status: She is alert and oriented to person, place, and time.  Psychiatric:        Mood and Affect: Mood normal.        Behavior: Behavior normal.    BP 122/80   Pulse 75   Temp (!) 96.9 F (36.1 C)   Ht _0  (1.6 m)   Wt 200 lb (90.7 kg)   SpO2 99%   BMI 35.43 kg/m   Wt Readings from Last 3 Encounters:  01/13/21 205 lb (93 kg)  01/11/21 209 lb 9.6 oz (95.1 kg)  12/24/20 210 lb (95.3 kg)    Health Maintenance Due  Topic Date Due   Pneumococcal Vaccine 32-5 Years old (1 - PCV) Never done     Lab Results  Component Value Date   TSH 0.79 12/28/2015   Lab Results  Component Value Date   WBC 10.9 (H) 09/16/2020   HGB 12.3 09/16/2020   HCT 38.1 09/16/2020   MCV 81 09/16/2020   PLT 346 09/16/2020   Lab Results  Component Value Date   NA 145 (H) 09/16/2020   K 3.8 09/16/2020   CO2 24 09/16/2020   GLUCOSE 88 09/16/2020   BUN 9 09/16/2020   CREATININE 0.43 (L) 09/16/2020   BILITOT 0.5 09/16/2020   ALKPHOS 75 09/16/2020   AST 24 09/16/2020   ALT 23 09/16/2020   PROT 6.5 09/16/2020   ALBUMIN  4.4 09/16/2020   CALCIUM 9.2 09/16/2020   ANIONGAP 9 11/27/2015   EGFR 133 09/16/2020   Lab Results  Component Value Date   CHOL 180 09/16/2020   Lab Results  Component Value Date   HDL 47 09/16/2020   Lab Results  Component Value Date   LDLCALC 117 (H) 09/16/2020  Lab Results  Component Value Date   TRIG 86 09/16/2020   Lab Results  Component Value Date   CHOLHDL 3.8 09/16/2020   Lab Results  Component Value Date   HGBA1C 5.4 09/16/2020       Assessment & Plan:   1. Acute cystitis with hematuria - sulfamethoxazole-trimethoprim (BACTRIM DS) 800-160 MG tablet; Take 1 tablet by mouth 2 (two) times daily.  Dispense: 10 tablet; Refill: 0 - Urine Culture - POCT urinalysis dipstick  2. Dysuria - POCT urinalysis dipstick  3. Other dietary vitamin B12 deficiency anemia - Cyanocobalamin 1000 MCG/ML KIT; Inject 1 mL as directed once a week. Inject 1 mL as directed once a week for four weeks, then once a month  Dispense: 1 kit; Refill: 1  4. History of candidiasis of vagina - fluconazole (DIFLUCAN) 150 MG tablet; Take 1 tablet (150 mg total) by mouth once for 1 day.  Dispense: 1 tablet; Refill: 0    Rest and push fluids Seek emergency medical care if symptoms worsen or become severe Notify office if symptoms fail to improve or worsen Follow-up as needed   Follow-up: PRN  I, Rip Harbour, NP, have reviewed all documentation for this visit. The documentation on 02/08/21 for the exam, diagnosis, procedures, and orders are all accurate and complete.   Signed, Rip Harbour, NP

## 2021-02-09 ENCOUNTER — Ambulatory Visit: Payer: Medicaid Other | Admitting: Physician Assistant

## 2021-02-10 ENCOUNTER — Other Ambulatory Visit: Payer: Self-pay | Admitting: Urology

## 2021-02-12 ENCOUNTER — Other Ambulatory Visit: Payer: Self-pay

## 2021-02-12 DIAGNOSIS — R112 Nausea with vomiting, unspecified: Secondary | ICD-10-CM

## 2021-02-12 MED ORDER — ONDANSETRON 4 MG PO TBDP: 4.0000 mg | ORAL_TABLET | Freq: Three times a day (TID) | ORAL | 1 refills | Status: DC | PRN

## 2021-02-13 LAB — URINE CULTURE

## 2021-02-14 ENCOUNTER — Other Ambulatory Visit: Payer: Self-pay | Admitting: Physician Assistant

## 2021-02-14 MED ORDER — DOXYCYCLINE HYCLATE 100 MG PO TABS: 100.0000 mg | ORAL_TABLET | Freq: Two times a day (BID) | ORAL | 0 refills | Status: DC

## 2021-02-15 ENCOUNTER — Encounter: Payer: Self-pay | Admitting: Physician Assistant

## 2021-02-15 ENCOUNTER — Ambulatory Visit (INDEPENDENT_AMBULATORY_CARE_PROVIDER_SITE_OTHER): Payer: Medicaid Other | Admitting: Physician Assistant

## 2021-02-15 VITALS — BP 146/88 | HR 89 | Temp 98.3°F | Ht 62.0 in | Wt 199.2 lb

## 2021-02-15 DIAGNOSIS — R5381 Other malaise: Secondary | ICD-10-CM

## 2021-02-15 DIAGNOSIS — N3090 Cystitis, unspecified without hematuria: Secondary | ICD-10-CM

## 2021-02-15 LAB — POC COVID19 BINAXNOW: SARS Coronavirus 2 Ag: NEGATIVE

## 2021-02-15 LAB — POC INFLUENZA A&B (BINAX/QUICKVUE)
Influenza A, POC: NEGATIVE
Influenza B, POC: NEGATIVE

## 2021-02-15 LAB — POCT RAPID STREP A (OFFICE): Rapid Strep A Screen: NEGATIVE

## 2021-02-15 NOTE — Progress Notes (Signed)
Acute Office Visit  Subjective:    Patient ID: Michelle Lamb, female    DOB: 1990-06-16, 30 y.o.   MRN: [id number]  Chief Complaint  Patient presents with   malaise    HPI: Patient is in today for complaints of malaise, sore throat and congestion --- pt states that for the past weeks she has felt like she has had a low grade temp of around 99 - last night she began having a sore throat and malaise - has had minimal cough that has not been very productive  Of note pt was seen for UTI on 12/12 and just got urine culture results back yesterday - the organism was resistant to several medications but did show sensitivity to doxycycline and that medication was called in for her last night - she has only taken one pill of this medication  Past Medical History:  Diagnosis Date   Benign essential HTN    Diabetes mellitus without complication ([place])    TYPE 2 LAST DOSE FRIDAY OCTOBER 27 (METFORMIN)   History of herpes simplex infection    Hyperlipidemia    Vaginal delivery 2009, 2010, 2015    Past Surgical History:  Procedure Laterality Date   CESAREAN SECTION  09/29/2016   CHOLECYSTECTOMY     DILATION AND CURETTAGE OF UTERUS N/A 12/31/2015   Procedure: SUCTION DILATATION AND CURETTAGE;  Surgeon: Aletha Halim, MD;  Location: [facility];  Service: Gynecology;  Laterality: N/A;   DILATION AND EVACUATION N/A 01/01/2016   Procedure: DILATATION AND EVACUATION;  Surgeon: Jonnie Kind, MD;  Location: [facility];  Service: Gynecology;  Laterality: N/A;   ESOPHAGOGASTRODUODENOSCOPY     GASTRIC BYPASS  06/02/2020   TONSILLECTOMY      Family History  Problem Relation Age of Onset   Hypertension Mother    Cancer Maternal Grandmother        stomach cancer   Depression Maternal Grandfather    Cancer Maternal Grandfather        leukemia   Depression Paternal Grandmother     Social History   Socioeconomic History   Marital status: Married    Spouse name: Not on file   Number of children: 4    Years of education: Not on file   Highest education level: Not on file  Occupational History   Occupation: Physiological scientist  Tobacco Use   Smoking status: Never   Smokeless tobacco: Never  Substance and Sexual Activity   Alcohol use: Yes    Comment: OCCASIONAL   Drug use: No   Sexual activity: Yes    Birth control/protection: None    Comment: Nexplanon removed early 2017  Other Topics Concern   Not on file  Social History Narrative   Not on file   Social Determinants of Health   Financial Resource Strain: Not on file  Food Insecurity: Not on file  Transportation Needs: Not on file  Physical Activity: Not on file  Stress: Not on file  Social Connections: Not on file  Intimate Partner Violence: Not on file    Outpatient Medications Prior to Visit  Medication Sig Dispense Refill   albuterol (VENTOLIN HFA) 108 (90 Base) MCG/ACT inhaler Inhale 2 puffs into the lungs every 6 (six) hours as needed.     budesonide-formoterol (SYMBICORT) 80-4.5 MCG/ACT inhaler Inhale 2 puffs into the lungs 2 (two) times daily.     Cyanocobalamin 1000 MCG/ML KIT Inject 1 mL as directed once a week. Inject 1 mL as directed once a  week for four weeks, then once a month 1 kit 1   cyclobenzaprine (FLEXERIL) 10 MG tablet Take 1 tablet (10 mg total) by mouth 3 (three) times daily as needed for muscle spasms. 30 tablet 0   doxycycline (VIBRA-TABS) 100 MG tablet Take 1 tablet (100 mg total) by mouth 2 (two) times daily. 20 tablet 0   gabapentin (NEURONTIN) 100 MG capsule TAKE 1 CAPSULE BY MOUTH THREE TIMES DAILY AS NEEDED     HYDROcodone bit-homatropine (HYDROMET) 5-1.5 MG/5ML syrup Take 5 mLs by mouth every 6 (six) hours as needed for cough. 120 mL 0   lactulose (CHRONULAC) 10 GM/15ML solution SMARTSIG:15 Milliliter(s) By Mouth Every 6 Hours PRN     LORazepam (ATIVAN) 1 MG tablet Take 1 tablet (1 mg total) by mouth daily as needed. 30 tablet 1   omeprazole (PRILOSEC) 20 MG capsule Take twice  daily while taking prednisone 30 capsule 0   ondansetron (ZOFRAN-ODT) 4 MG disintegrating tablet Take 1 tablet (4 mg total) by mouth every 8 (eight) hours as needed for nausea or vomiting. 90 tablet 1   oseltamivir (TAMIFLU) 75 MG capsule Take 1 capsule (75 mg total) by mouth 2 (two) times daily. 10 capsule 0   promethazine (PHENERGAN) 25 MG tablet Take by mouth.     Semaglutide,0.25 or 0.5MG/DOS, (OZEMPIC, 0.25 OR 0.5 MG/DOSE,) 2 MG/1.5ML SOPN Inject 0.25 mg into the skin once a week. For 4 weeks starting 12/01/2020-12/29/2020. 1.5 mL 0   Vitamin D, Ergocalciferol, (DRISDOL) 1.25 MG (50000 UNIT) CAPS capsule One pill twice weekly 24 capsule 1   No facility-administered medications prior to visit.    No Known Allergies  Review of Systems CONSTITUTIONAL: see HPI E/N/T: see HPI CARDIOVASCULAR: Negative for chest pain,  RESPIRATORY: Negative for recent cough and dyspnea.  GASTROINTESTINAL: Negative for abdominal pain,  constipation, diarrhea, nausea and vomiting.  INTEGUMENTARY: Negative for rash.   Office Visit on 02/15/2021  Component Date Value Ref Range Status   SARS Coronavirus 2 Ag 02/15/2021 Negative  Negative Final   Rapid Strep A Screen 02/15/2021 Negative  Negative Final   Influenza A, POC 02/15/2021 Negative  Negative Final   Influenza B, POC 02/15/2021 Negative  Negative Final        Objective:    Physical Exam  BP (!) 146/88    Pulse 89    Temp 98.3 F (36.8 C)    Ht 5' 2"  (1.575 m)    Wt 199 lb 3.2 oz (90.4 kg)    SpO2 99%    BMI 36.43 kg/m  Wt Readings from Last 3 Encounters:  02/15/21 199 lb 3.2 oz (90.4 kg)  02/08/21 200 lb (90.7 kg)  01/13/21 205 lb (93 kg)    There are no preventive care reminders to display for this patient.  There are no preventive care reminders to display for this patient.   Lab Results  Component Value Date   TSH 0.79 12/28/2015   Lab Results  Component Value Date   WBC 10.9 (H) 09/16/2020   HGB 12.3 09/16/2020   HCT 38.1  09/16/2020   MCV 81 09/16/2020   PLT 346 09/16/2020   Lab Results  Component Value Date   NA 145 (H) 09/16/2020   K 3.8 09/16/2020   CO2 24 09/16/2020   GLUCOSE 88 09/16/2020   BUN 9 09/16/2020   CREATININE 0.43 (L) 09/16/2020   BILITOT 0.5 09/16/2020   ALKPHOS 75 09/16/2020   AST 24 09/16/2020   ALT 23 09/16/2020  PROT 6.5 09/16/2020   ALBUMIN 4.4 09/16/2020   CALCIUM 9.2 09/16/2020   ANIONGAP 9 11/27/2015   EGFR 133 09/16/2020   Lab Results  Component Value Date   CHOL 180 09/16/2020   Lab Results  Component Value Date   HDL 47 09/16/2020   Lab Results  Component Value Date   LDLCALC 117 (H) 09/16/2020   Lab Results  Component Value Date   TRIG 86 09/16/2020   Lab Results  Component Value Date   CHOLHDL 3.8 09/16/2020   Lab Results  Component Value Date   HGBA1C 5.4 09/16/2020       Assessment & Plan:   Problem List Items Addressed This Visit       Other   Malaise - Primary   Relevant Orders   POC COVID-19 BinaxNow (Completed)   POCT rapid strep A (Completed)   POC Influenza A&B(BINAX/QUICKVUE) (Completed) Recommend rest, fluids and follow up if any symptoms change or worsen   Other Visit Diagnoses     Cystitis     Take doxycycline as directed - plan to repeat ua and culture next week      No orders of the defined types were placed in this encounter.   Orders Placed This Encounter  Procedures   POC COVID-19 BinaxNow   POCT rapid strep A   POC Influenza A&B(BINAX/QUICKVUE)     Follow-up: Return if symptoms worsen or fail to improve.  An After Visit Summary was printed and given to the patient.  Yetta Flock [facility] [phone]

## 2021-02-17 ENCOUNTER — Telehealth: Payer: Self-pay

## 2021-02-17 ENCOUNTER — Other Ambulatory Visit: Payer: Self-pay | Admitting: Physician Assistant

## 2021-02-17 DIAGNOSIS — J060 Acute laryngopharyngitis: Secondary | ICD-10-CM

## 2021-02-17 MED ORDER — HYDROCODONE BIT-HOMATROP MBR 5-1.5 MG/5ML PO SOLN: 5.0000 mL | Freq: Four times a day (QID) | ORAL | 0 refills | Status: DC | PRN

## 2021-02-17 NOTE — Telephone Encounter (Signed)
Patient is calling requesting medication for cough, especially at night time. Thank you!

## 2021-03-04 NOTE — Patient Instructions (Addendum)
DUE TO COVID-19 ONLY ONE VISITOR IS ALLOWED TO COME WITH YOU AND STAY IN THE WAITING ROOM ONLY DURING PRE OP AND PROCEDURE.   **NO VISITORS ARE ALLOWED IN THE SHORT STAY AREA OR RECOVERY ROOM!!**  IF YOU WILL BE ADMITTED INTO THE HOSPITAL YOU ARE ALLOWED ONLY TWO SUPPORT PEOPLE DURING VISITATION HOURS ONLY (7 AM -8PM)   The support person(s) must pass our screening, gel in and out, and wear a mask at all times, including in the patients room. Patients must also wear a mask when staff or their support person are in the room. Visitors GUEST BADGE MUST BE WORN VISIBLY  One adult visitor may remain with you overnight and MUST be in the room by 8 P.M.  No visitors under the age of 6. Any visitor under the age of 49 must be accompanied by an adult.        Your procedure is scheduled on: 03/15/20   Report [facility]    Report to short stay at: 5:15 AM   Call this number if you have problems the morning of surgery [phone]  CLEAR LIQUID DIET: STARTING THE DAY BEFORE SURGERY UNTIL: 4:15 am. Foods Allowed                                                                     Foods Excluded  Water, Black Coffee and tea, regular and decaf                             liquids that you cannot  Plain Jell-O in any flavor  (No red)                                           see through such as: Fruit ices (not with fruit pulp)                                     milk, soups, orange juice              Iced Popsicles (No red)                                    All solid food                                   Apple juices Sports drinks like Gatorade (No red) Lightly seasoned clear broth or consume(fat free) Sugar  Sample Menu Breakfast                                Lunch                                     Supper Cranberry juice  Beef broth                            Chicken broth Jell-O                                     Grape juice                            Apple juice Coffee or tea                        Jell-O                                      Popsicle                                                Coffee or tea                        Coffee or tea   Oral Hygiene is also important to reduce your risk of infection.                                    Remember - BRUSH YOUR TEETH THE MORNING OF SURGERY WITH YOUR REGULAR TOOTHPASTE   Do NOT smoke after Midnight   Take these medicines the morning of surgery with A SIP OF WATER: LORAZEPAM AND GABAPENTIN AS NEEDED. How to Manage Your Diabetes Before and After Surgery  Why is it important to control my blood sugar before and after surgery? Improving blood sugar levels before and after surgery helps healing and can limit problems. A way of improving blood sugar control is eating a healthy diet by:  Eating less sugar and carbohydrates  Increasing activity/exercise  Talking with your doctor about reaching your blood sugar goals High blood sugars (greater than 180 mg/dL) can raise your risk of infections and slow your recovery, so you will need to focus on controlling your diabetes during the weeks before surgery. Make sure that the doctor who takes care of your diabetes knows about your planned surgery including the date and location.  How do I manage my blood sugar before surgery? Check your blood sugar at least 4 times a day, starting 2 days before surgery, to make sure that the level is not too high or low. Check your blood sugar the morning of your surgery when you wake up and every 2 hours until you get to the Short Stay unit. If your blood sugar is less than 70 mg/dL, you will need to treat for low blood sugar: Do not take insulin. Treat a low blood sugar (less than 70 mg/dL) with  cup of clear juice (cranberry or apple), 4 glucose tablets, OR glucose gel. Recheck blood sugar in 15 minutes after treatment (to make sure it is greater than 70 mg/dL). If your blood sugar is not greater than  70 mg/dL on recheck, call [phone] for further instructions. Report your blood sugar to the short  stay nurse when you get to Short Stay.  If you are admitted to the hospital after surgery: Your blood sugar will be checked by the staff and you will probably be given insulin after surgery (instead of oral diabetes medicines) to make sure you have good blood sugar levels. The goal for blood sugar control after surgery is 80-180 mg/dL.   WHAT DO I DO ABOUT MY DIABETES MEDICATION?      The day of surgery, do not take other diabetes injectables, including Byetta (exenatide), Bydureon (exenatide ER), Victoza (liraglutide), or Trulicity (dulaglutide).  DO NOT TAKE ANY ORAL DIABETIC MEDICATIONS DAY OF YOUR SURGERY                              You may not have any metal on your body including hair pins, jewelry, and body piercing             Do not wear make-up, lotions, powders, perfumes/cologne, or deodorant  Do not wear nail polish including gel and S&S, artificial/acrylic nails, or any other type of covering on natural nails including finger and toenails. If you have artificial nails, gel coating, etc. that needs to be removed by a nail salon please have this removed prior to surgery or surgery may need to be canceled/ delayed if the surgeon/ anesthesia feels like they are unable to be safely monitored.   Do not shave  48 hours prior to surgery.    Do not bring valuables to the hospital. [place].   Contacts, dentures or bridgework may not be worn into surgery.   Bring small overnight bag day of surgery.    Patients discharged on the day of surgery will not be allowed to drive home.  We recommend you have a responsible adult to stay with you for the first 24 hours after anesthesia.   Special Instructions: Bring a copy of your healthcare power of attorney and living will documents         the day of surgery if you haven't scanned them  before.              Please read over the following fact sheets you were given: IF YOU HAVE QUESTIONS ABOUT YOUR PRE-OP INSTRUCTIONS PLEASE CALL [phone]     [facility] - Preparing for Surgery Before surgery, you can play an important role.  Because skin is not sterile, your skin needs to be as free of germs as possible.  You can reduce the number of germs on your skin by washing with CHG (chlorahexidine gluconate) soap before surgery.  CHG is an antiseptic cleaner which kills germs and bonds with the skin to continue killing germs even after washing. Please DO NOT use if you have an allergy to CHG or antibacterial soaps.  If your skin becomes reddened/irritated stop using the CHG and inform your nurse when you arrive at Short Stay. Do not shave (including legs and underarms) for at least 48 hours prior to the first CHG shower.  You may shave your face/neck. Please follow these instructions carefully:  1.  Shower with CHG Soap the night before surgery and the  morning of Surgery.  2.  If you choose to wash your hair, wash your hair first as usual with your  normal  shampoo.  3.  After you shampoo, rinse your  hair and body thoroughly to remove the  shampoo.                           4.  Use CHG as you would any other liquid soap.  You can apply chg directly  to the skin and wash                       Gently with a scrungie or clean washcloth.  5.  Apply the CHG Soap to your body ONLY FROM THE NECK DOWN.   Do not use on face/ open                           Wound or open sores. Avoid contact with eyes, ears mouth and genitals (private parts).                       Wash face,  Genitals (private parts) with your normal soap.             6.  Wash thoroughly, paying special attention to the area where your surgery  will be performed.  7.  Thoroughly rinse your body with warm water from the neck down.  8.  DO NOT shower/wash with your normal soap after using and rinsing off  the CHG Soap.                 9.  Pat yourself dry with a clean towel.            10.  Wear clean pajamas.            11.  Place clean sheets on your bed the night of your first shower and do not  sleep with pets. Day of Surgery : Do not apply any lotions/deodorants the morning of surgery.  Please wear clean clothes to the hospital/surgery center.  FAILURE TO FOLLOW THESE INSTRUCTIONS MAY RESULT IN THE CANCELLATION OF YOUR SURGERY PATIENT SIGNATURE_________________________________  NURSE SIGNATURE__________________________________  ________________________________________________________________________  WHAT IS A BLOOD TRANSFUSION? Blood Transfusion Information  A transfusion is the replacement of blood or some of its parts. Blood is made up of multiple cells which provide different functions. Red blood cells carry oxygen and are used for blood loss replacement. White blood cells fight against infection. Platelets control bleeding. Plasma helps clot blood. Other blood products are available for specialized needs, such as hemophilia or other clotting disorders. BEFORE THE TRANSFUSION  Who gives blood for transfusions?  Healthy volunteers who are fully evaluated to make sure their blood is safe. This is blood bank blood. Transfusion therapy is the safest it has ever been in the practice of medicine. Before blood is taken from a donor, a complete history is taken to make sure that person has no history of diseases nor engages in risky social behavior (examples are intravenous drug use or sexual activity with multiple partners). The donor's travel history is screened to minimize risk of transmitting infections, such as malaria. The donated blood is tested for signs of infectious diseases, such as HIV and hepatitis. The blood is then tested to be sure it is compatible with you in order to minimize the chance of a transfusion reaction. If you or a relative donates blood, this is often done in anticipation of surgery and is  not appropriate for emergency situations. It takes many days to process the donated blood. RISKS  AND COMPLICATIONS Although transfusion therapy is very safe and saves many lives, the main dangers of transfusion include:  Getting an infectious disease. Developing a transfusion reaction. This is an allergic reaction to something in the blood you were given. Every precaution is taken to prevent this. The decision to have a blood transfusion has been considered carefully by your caregiver before blood is given. Blood is not given unless the benefits outweigh the risks. AFTER THE TRANSFUSION Right after receiving a blood transfusion, you will usually feel much better and more energetic. This is especially true if your red blood cells have gotten low (anemic). The transfusion raises the level of the red blood cells which carry oxygen, and this usually causes an energy increase. The nurse administering the transfusion will monitor you carefully for complications. HOME CARE INSTRUCTIONS  No special instructions are needed after a transfusion. You may find your energy is better. Speak with your caregiver about any limitations on activity for underlying diseases you may have. SEEK MEDICAL CARE IF:  Your condition is not improving after your transfusion. You develop redness or irritation at the intravenous (IV) site. SEEK IMMEDIATE MEDICAL CARE IF:  Any of the following symptoms occur over the next 12 hours: Shaking chills. You have a temperature by mouth above 102 F (38.9 C), not controlled by medicine. Chest, back, or muscle pain. People around you feel you are not acting correctly or are confused. Shortness of breath or difficulty breathing. Dizziness and fainting. You get a rash or develop hives. You have a decrease in urine output. Your urine turns a dark color or changes to pink, red, or brown. Any of the following symptoms occur over the next 10 days: You have a temperature by mouth above  102 F (38.9 C), not controlled by medicine. Shortness of breath. Weakness after normal activity. The white part of the eye turns yellow (jaundice). You have a decrease in the amount of urine or are urinating less often. Your urine turns a dark color or changes to pink, red, or brown. Document Released: 02/12/2000 Document Revised: 05/09/2011 Document Reviewed: 10/01/2007 [facility] Patient Information 2014 [place], [place].  _______________________________________________________________________

## 2021-03-05 ENCOUNTER — Encounter (HOSPITAL_COMMUNITY): Payer: Self-pay

## 2021-03-05 ENCOUNTER — Other Ambulatory Visit: Payer: Self-pay

## 2021-03-05 ENCOUNTER — Encounter (HOSPITAL_COMMUNITY)
Admission: RE | Admit: 2021-03-05 | Discharge: 2021-03-05 | Disposition: A | Discharge status: Home / Self Care | Payer: No Typology Code available for payment source | Source: Ambulatory Visit | Attending: Urology | Admitting: Urology

## 2021-03-05 VITALS — BP 143/100 | HR 66 | Temp 98.6°F | Ht 62.0 in | Wt 195.0 lb

## 2021-03-05 DIAGNOSIS — E669 Obesity, unspecified: Secondary | ICD-10-CM | POA: Insufficient documentation

## 2021-03-05 DIAGNOSIS — E1169 Type 2 diabetes mellitus with other specified complication: Secondary | ICD-10-CM | POA: Diagnosis not present

## 2021-03-05 DIAGNOSIS — Z01812 Encounter for preprocedural laboratory examination: Secondary | ICD-10-CM | POA: Insufficient documentation

## 2021-03-05 DIAGNOSIS — Z01818 Encounter for other preprocedural examination: Secondary | ICD-10-CM

## 2021-03-05 HISTORY — DX: Anxiety disorder, unspecified: F41.9

## 2021-03-05 HISTORY — DX: Chronic kidney disease, unspecified: N18.9

## 2021-03-05 HISTORY — DX: Depression, unspecified: F32.A

## 2021-03-05 LAB — CBC
HCT: 37.4 % (ref 36.0–46.0)
Hemoglobin: 11.6 g/dL — ABNORMAL LOW (ref 12.0–15.0)
MCH: 26.5 pg (ref 26.0–34.0)
MCHC: 31 g/dL (ref 30.0–36.0)
MCV: 85.4 fL (ref 80.0–100.0)
Platelets: 321 10*3/uL (ref 150–400)
RBC: 4.38 MIL/uL (ref 3.87–5.11)
RDW: 15 % (ref 11.5–15.5)
WBC: 10.6 10*3/uL — ABNORMAL HIGH (ref 4.0–10.5)
nRBC: 0 % (ref 0.0–0.2)

## 2021-03-05 LAB — BASIC METABOLIC PANEL WITH GFR
Anion gap: 3 — ABNORMAL LOW (ref 5–15)
BUN: 13 mg/dL (ref 6–20)
CO2: 25 mmol/L (ref 22–32)
Calcium: 8.5 mg/dL — ABNORMAL LOW (ref 8.9–10.3)
Chloride: 108 mmol/L (ref 98–111)
Creatinine, Ser: 0.54 mg/dL (ref 0.44–1.00)
GFR, Estimated: >60 mL/min
Glucose, Bld: 74 mg/dL (ref 70–99)
Potassium: 4.1 mmol/L (ref 3.5–5.1)
Sodium: 136 mmol/L (ref 135–145)

## 2021-03-05 LAB — HEMOGLOBIN A1C
Hgb A1c MFr Bld: 5 % (ref 4.8–5.6)
Mean Plasma Glucose: 96.8 mg/dL

## 2021-03-05 LAB — GLUCOSE, CAPILLARY: Glucose-Capillary: 89 mg/dL (ref 70–99)

## 2021-03-05 NOTE — Progress Notes (Signed)
COVID Vaccine Completed: Yes Date COVID Vaccine completed: 04/12/19 x 2 COVID vaccine manufacturer: Pfizer     COVID Test: N/A PCP - [name]: PAC. LOV: 02/15/21 Cardiologist -   Chest x-ray -  EKG - 09/30/20: EPIC Stress Test -  ECHO -  Cardiac Cath -  Pacemaker/ICD device last checked:  Sleep Study - Yes  CPAP - NO  Fasting Blood Sugar - 70's Checks Blood Sugar ___3__ times a week  Blood Thinner Instructions: Aspirin Instructions: Last Dose:  Anesthesia review: Hx: HTN,OSA(NO CPAP),DIA  Patient denies shortness of breath, fever, cough and chest pain at PAT appointment   Patient verbalized understanding of instructions that were given to them at the PAT appointment. Patient was also instructed that they will need to review over the PAT instructions again at home before surgery.

## 2021-03-11 ENCOUNTER — Other Ambulatory Visit (HOSPITAL_COMMUNITY): Payer: Self-pay

## 2021-03-11 ENCOUNTER — Other Ambulatory Visit: Payer: Self-pay | Admitting: Physician Assistant

## 2021-03-11 ENCOUNTER — Other Ambulatory Visit: Payer: Self-pay

## 2021-03-11 DIAGNOSIS — E559 Vitamin D deficiency, unspecified: Secondary | ICD-10-CM

## 2021-03-11 DIAGNOSIS — D513 Other dietary vitamin B12 deficiency anemia: Secondary | ICD-10-CM

## 2021-03-11 DIAGNOSIS — R634 Abnormal weight loss: Secondary | ICD-10-CM

## 2021-03-11 DIAGNOSIS — F418 Other specified anxiety disorders: Secondary | ICD-10-CM

## 2021-03-11 MED ORDER — LORAZEPAM 1 MG PO TABS
1.0000 mg | ORAL_TABLET | Freq: Every day | ORAL | 1 refills | Status: DC | PRN
  Filled 2021-03-11: qty 30, 30d supply, fill #0
  Filled 2021-07-27: qty 30, 30d supply, fill #1

## 2021-03-11 MED ORDER — OZEMPIC (1 MG/DOSE) 4 MG/3ML ~~LOC~~ SOPN
1.0000 mg | PEN_INJECTOR | SUBCUTANEOUS | 1 refills | Status: DC
  Filled 2021-03-11: qty 9, 84d supply, fill #0

## 2021-03-11 MED ORDER — VITAMIN D (ERGOCALCIFEROL) 1.25 MG (50000 UNIT) PO CAPS
ORAL_CAPSULE | ORAL | 1 refills | Status: DC
  Filled 2021-03-11: qty 24, 84d supply, fill #0

## 2021-03-11 MED ORDER — CYANOCOBALAMIN 1000 MCG/ML IJ SOLN
INTRAMUSCULAR | 1 refills | Status: DC
  Filled 2021-03-11: qty 1, 30d supply, fill #0

## 2021-03-12 ENCOUNTER — Other Ambulatory Visit (HOSPITAL_COMMUNITY): Payer: Self-pay

## 2021-03-12 NOTE — H&P (Signed)
Office Visit Report     03/05/2021   --------------------------------------------------------------------------------   Michelle Lamb  MRN: [id number]  DOB: Jul 30, 1990, 31 year old Female  SSN:    PRIMARY CARE:  Everlean Alstrom. [place], [place]  REFERRING:  Everlean Alstrom. [place], [place]  PROVIDER:  Rexene Alberts, M.D.  TREATING:  Jiles Crocker, NP  LOCATION:  [facility] [phone]     --------------------------------------------------------------------------------   CC/HPI: Right renal angiomyolipoma   Michelle Lamb is a 31 year old female who is seen today in consultation from Dr. 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 This was initially noted on imaging on 10/10/2016 when she had a CT scan which did show a 2.6 cm right renal angiomyolipoma. This was apparently not discussed with [name] at that time. Her imaging was performed due to complaints of persistent left abdominal pain and right flank pressure. She was told multiple times that she had a urinary tract infection although denies any lower urinary tract symptoms such as dysuria, etc. She was treated with multiple rounds of antibiotics with only minimal improvement of her symptoms. More recently, she underwent a CT scan in September for further evaluation of known hepatic steatosis and liver lesions as well as due to persistent abdominal pain symptoms. She was noted to have an increase in the size of her right renal angiomyolipoma and was also noted to have nonobstructing small right renal calculi. A follow-up MRI in 11/30/2020 was performed to further characterize her liver lesions which appear to most likely be benign. She was placed on Ozempic per her primary care provider for treatment of her fatty liver and what sounds like diet-controlled diabetes. In addition, her right renal angiomyolipoma was better characterized and measured approximately 4.4 cm at this time off the upper pole of the right kidney. This is fairly exophytic. She  currently denies any gross hematuria or dysuria/suprapubic discomfort.   Her past medical history is significant for gastroesophageal reflux disease, obesity status post gastric bypass that was performed minimally invasively, lorazepam that she takes for anxiety at night, odansetron which she takes for chronic nausea, diet-controlled diabetes, and asthma although rarely requires her albuterol inhaler.   Her past surgical history is significant for a laparoscopic gastric bypass in April 2022 and a laparoscopic cholecystectomy in 2011.   03/05/2020: Patient with above-noted history, here today for preoperative examination prior to undergoing robotic assisted right partial nephrectomy on 1/16 with Dr. Alinda Money for definitive management of an enlarging AML. Since time of last office visit, the patient developed symptoms of a UTI. Urine culture positive for staph epidermidis. This was appropriately treated with Macrobid by patient's primary care provider. Otherwise, she denies any changes in past medical history, prescription medications taken on daily basis, no interval surgical or procedural interventions. Voiding symptoms have grossly returned to baseline with stable nonbothersome symptomology. Outside of recent antimicrobial treatment for UTI, she has had no persistent or recurring dysuria, denies gross hematuria. Continues to complain of pressure which is somewhat constant over the right lower back which has been a chronic finding for her. No radiation to the flank or lower abdomen. She denies any recent fevers or chills, nausea/vomiting, chest pain/shortness of breath, numbness or tingling in the extremities.     ALLERGIES: No Known Drug Allergies    MEDICATIONS: Omeprazole 20 mg capsule,delayed release  Cyclobenzaprine Hcl 10 mg tablet  Lorazepam 1 mg tablet  Ondansetron Hcl 4 mg tablet  Ozempic 0.25 mg or 0.5 mg dose (2 mg/1.5  ml) pen injector  Vitamin D2     GU PSH: No GU PSH    NON-GU PSH:  Gastric bypass - 12/29/2020 Remove Gallbladder - 2011     GU PMH: Benign tumor right kidney - 01/12/2021 Renal calculus - 01/12/2021    NON-GU PMH: Bacteriuria - 01/26/2021 Benign lipomatous neoplasm of kidney - 01/26/2021 Anxiety Asthma Diabetes Type 2 GERD    FAMILY HISTORY: 1 son - Runs in Family 3 daughters - Runs in Family Primary cancer of lesser curve of stomach - Runs in Family   SOCIAL HISTORY: Marital Status: Married Ethnicity: Not Hispanic Or Latino; Race: Black or African American Current Smoking Status: Patient has never smoked.   Tobacco Use Assessment Completed: Used Tobacco in last 30 days? Has never drank.  Drinks 2 caffeinated drinks per day.    REVIEW OF SYSTEMS:    GU Review Female:   Patient denies frequent urination, hard to postpone urination, burning /pain with urination, get up at night to urinate, leakage of urine, stream starts and stops, trouble starting your stream, have to strain to urinate, and being pregnant.  Gastrointestinal (Upper):   Patient denies nausea, vomiting, and indigestion/ heartburn.  Gastrointestinal (Lower):   Patient denies diarrhea and constipation.  Constitutional:   Patient denies fever, night sweats, weight loss, and fatigue.  Skin:   Patient denies skin rash/ lesion and itching.  Eyes:   Patient denies blurred vision and double vision.  Ears/ Nose/ Throat:   Patient denies sore throat and sinus problems.  Hematologic/Lymphatic:   Patient denies swollen glands and easy bruising.  Cardiovascular:   Patient denies leg swelling and chest pains.  Respiratory:   Patient denies cough and shortness of breath.  Endocrine:   Patient denies excessive thirst.  Musculoskeletal:   Patient reports back pain. Patient denies joint pain.  Neurological:   Patient denies headaches and dizziness.  Psychologic:   Patient denies depression and anxiety.   VITAL SIGNS:      03/05/2021 11:45 AM  Weight 196 lb / 88.9 kg  Height 62 in / 157.48  cm  BP 136/82 mmHg  Pulse 68 /min  Temperature 97.8 F / 36.5 C  BMI 35.8 kg/m   MULTI-SYSTEM PHYSICAL EXAMINATION:    Constitutional: Well-nourished. No physical deformities. Normally developed. Good grooming.  Neck: Neck symmetrical, not swollen. Normal tracheal position.  Respiratory: No labored breathing, no use of accessory muscles. Clear bilaterally.  Cardiovascular: Normal temperature, normal extremity pulses, no swelling, no varicosities. Regular rate and rhythm.  Skin: No paleness, no jaundice, no cyanosis. No lesion, no ulcer, no rash.  Neurologic / Psychiatric: Oriented to time, oriented to place, oriented to person. No depression, no anxiety, no agitation.  Gastrointestinal: No mass, no tenderness, no rigidity, obese abdomen. Well-healed laparoscopic scars.  Musculoskeletal: Normal gait and station of head and neck.     Complexity of Data:  Source Of History:  Patient, Medical Record Summary  Records Review:   Previous Doctor Records, Previous Hospital Records, Previous Patient Records  Urine Test Review:   Urinalysis, Urine Culture  X-Ray Review: C.T. Abdomen/Pelvis: Reviewed Films. Reviewed Report.  MRI Abdomen: Reviewed Films. Reviewed Report.     03/05/21  Urinalysis  Urine Appearance Cloudy   Urine Color Amber   Urine Glucose Neg mg/dL  Urine Bilirubin Neg mg/dL  Urine Ketones Neg mg/dL  Urine Specific Gravity 1.030   Urine Blood 3+ ery/uL  Urine pH 5.5   Urine Protein 1+ mg/dL  Urine Urobilinogen 1.0 mg/dL  Urine Nitrites Neg   Urine Leukocyte Esterase 1+ leu/uL  Urine WBC/hpf 0 - 5/hpf   Urine RBC/hpf 20 - 40/hpf   Urine Epithelial Cells 0 - 5/hpf   Urine Bacteria Mod (26-50/hpf)   Urine Mucous Not Present   Urine Yeast NS (Not Seen)   Urine Trichomonas Not Present   Urine Cystals NS (Not Seen)   Urine Casts NS (Not Seen)   Urine Sperm Not Present    PROCEDURES:          Urinalysis w/Scope Dipstick Dipstick Cont'd Micro  Color: Amber Bilirubin:  Neg mg/dL WBC/hpf: 0 - 5/hpf  Appearance: Cloudy Ketones: Neg mg/dL RBC/hpf: 20 - 40/hpf  Specific Gravity: 1.030 Blood: 3+ ery/uL Bacteria: Mod (26-50/hpf)  pH: 5.5 Protein: 1+ mg/dL Cystals: NS (Not Seen)  Glucose: Neg mg/dL Urobilinogen: 1.0 mg/dL Casts: NS (Not Seen)    Nitrites: Neg Trichomonas: Not Present    Leukocyte Esterase: 1+ leu/uL Mucous: Not Present      Epithelial Cells: 0 - 5/hpf      Yeast: NS (Not Seen)      Sperm: Not Present    Notes: Microscopic performed on unspun smple due to QNS.    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 Precautionary urine culture sent today to serve as a baseline. Pending results, she may require preoperative antimicrobial treatment. I will discuss this further with her urologist in the event this occurs. Moving forward she will keep scheduled surgery date for right robotic laparoscopic nephrectomy on 1/16 with Dr. Alinda Money.        Next Appointment:      Next Appointment: 03/15/2021 07:15 AM    Appointment Type: Surgery     Location: [facility] [phone]    Provider: Raynelle Bring, M.D.    Reason for Visit: WL/OP RT RA LAP PARTIAL NEPHRECTOMY      * Signed by Jiles Crocker, NP on 03/05/21 at 12:06 PM (EST)*

## 2021-03-14 NOTE — Anesthesia Preprocedure Evaluation (Addendum)
Anesthesia Evaluation  Patient identified by MRN, date of birth, ID band Patient awake    Reviewed: Allergy & Precautions, H&P , NPO status , Patient's Chart, lab work & pertinent test results  Airway Mallampati: III  TM Distance: >3 FB Neck ROM: Full    Dental no notable dental hx. (+) Teeth Intact, Dental Advisory Given   Pulmonary asthma ,    Pulmonary exam normal breath sounds clear to auscultation       Cardiovascular Exercise Tolerance: Good hypertension,  Rhythm:Regular Rate:Normal     Neuro/Psych Anxiety Depression negative neurological ROS     GI/Hepatic negative GI ROS, Neg liver ROS,   Endo/Other  diabetes, Type 2, Oral Hypoglycemic AgentsMorbid obesity  Renal/GU Renal disease  negative genitourinary   Musculoskeletal   Abdominal   Peds  Hematology negative hematology ROS (+)   Anesthesia Other Findings   Reproductive/Obstetrics negative OB ROS                            Anesthesia Physical Anesthesia Plan  ASA: 3  Anesthesia Plan: General   Post-op Pain Management: Tylenol PO (pre-op)   Induction: Intravenous  PONV Risk Score and Plan: 4 or greater and Ondansetron, Dexamethasone and Midazolam  Airway Management Planned: Oral ETT  Additional Equipment:   Intra-op Plan:   Post-operative Plan: Extubation in OR  Informed Consent: I have reviewed the patients History and Physical, chart, labs and discussed the procedure including the risks, benefits and alternatives for the proposed anesthesia with the patient or authorized representative who has indicated his/her understanding and acceptance.     Dental advisory given  Plan Discussed with: CRNA  Anesthesia Plan Comments:        Anesthesia Quick Evaluation

## 2021-03-15 ENCOUNTER — Observation Stay (HOSPITAL_COMMUNITY)
Admission: RE | Admit: 2021-03-15 | Discharge: 2021-03-17 | Disposition: A | Discharge status: Home / Self Care | Payer: No Typology Code available for payment source | Source: Ambulatory Visit | Attending: Urology | Admitting: Urology

## 2021-03-15 ENCOUNTER — Encounter (HOSPITAL_COMMUNITY): Payer: Self-pay | Admitting: Urology

## 2021-03-15 ENCOUNTER — Ambulatory Visit (HOSPITAL_COMMUNITY): Payer: No Typology Code available for payment source | Admitting: Anesthesiology

## 2021-03-15 ENCOUNTER — Other Ambulatory Visit: Payer: Self-pay

## 2021-03-15 ENCOUNTER — Encounter (HOSPITAL_COMMUNITY)
Admission: RE | Disposition: A | Discharge status: Home / Self Care | Payer: Self-pay | Source: Ambulatory Visit | Attending: Urology

## 2021-03-15 DIAGNOSIS — Z79899 Other long term (current) drug therapy: Secondary | ICD-10-CM | POA: Insufficient documentation

## 2021-03-15 DIAGNOSIS — Z20822 Contact with and (suspected) exposure to covid-19: Secondary | ICD-10-CM | POA: Insufficient documentation

## 2021-03-15 DIAGNOSIS — E119 Type 2 diabetes mellitus without complications: Secondary | ICD-10-CM | POA: Diagnosis not present

## 2021-03-15 DIAGNOSIS — N2889 Other specified disorders of kidney and ureter: Secondary | ICD-10-CM | POA: Diagnosis present

## 2021-03-15 DIAGNOSIS — R8271 Bacteriuria: Secondary | ICD-10-CM | POA: Insufficient documentation

## 2021-03-15 DIAGNOSIS — D1771 Benign lipomatous neoplasm of kidney: Principal | ICD-10-CM | POA: Insufficient documentation

## 2021-03-15 DIAGNOSIS — J45909 Unspecified asthma, uncomplicated: Secondary | ICD-10-CM | POA: Diagnosis not present

## 2021-03-15 DIAGNOSIS — D49511 Neoplasm of unspecified behavior of right kidney: Secondary | ICD-10-CM | POA: Diagnosis present

## 2021-03-15 DIAGNOSIS — Z01818 Encounter for other preprocedural examination: Secondary | ICD-10-CM

## 2021-03-15 HISTORY — PX: ROBOTIC ASSITED PARTIAL NEPHRECTOMY: SHX6087

## 2021-03-15 LAB — TYPE AND SCREEN
ABO/RH(D): O POS
Antibody Screen: NEGATIVE

## 2021-03-15 LAB — HEMOGLOBIN AND HEMATOCRIT, BLOOD
HCT: 36.1 % (ref 36.0–46.0)
Hemoglobin: 11.4 g/dL — ABNORMAL LOW (ref 12.0–15.0)

## 2021-03-15 LAB — BASIC METABOLIC PANEL
Anion gap: 7 (ref 5–15)
BUN: 11 mg/dL (ref 6–20)
CO2: 24 mmol/L (ref 22–32)
Calcium: 8.2 mg/dL — ABNORMAL LOW (ref 8.9–10.3)
Chloride: 104 mmol/L (ref 98–111)
Creatinine, Ser: 0.59 mg/dL (ref 0.44–1.00)
GFR, Estimated: >60 mL/min (ref >60)
Glucose, Bld: 106 mg/dL — ABNORMAL HIGH (ref 70–99)
Potassium: 3.4 mmol/L — ABNORMAL LOW (ref 3.5–5.1)
Sodium: 135 mmol/L (ref 135–145)

## 2021-03-15 LAB — GLUCOSE, CAPILLARY
Glucose-Capillary: 95 mg/dL (ref 70–99)
Glucose-Capillary: 148 mg/dL — ABNORMAL HIGH (ref 70–99)
Glucose-Capillary: 147 mg/dL — ABNORMAL HIGH (ref 70–99)

## 2021-03-15 LAB — RESP PANEL BY RT-PCR (FLU A&B, COVID) ARPGX2
Influenza A by PCR: NEGATIVE
Influenza B by PCR: NEGATIVE
SARS Coronavirus 2 by RT PCR: NEGATIVE

## 2021-03-15 LAB — PREGNANCY, URINE: Preg Test, Ur: NEGATIVE

## 2021-03-15 SURGERY — NEPHRECTOMY, PARTIAL, ROBOT-ASSISTED
Anesthesia: General | Laterality: Right

## 2021-03-15 MED ORDER — GABAPENTIN 100 MG PO CAPS: 100.0000 mg | ORAL_CAPSULE | Freq: Every day | ORAL | Status: DC | PRN

## 2021-03-15 MED ORDER — LACTATED RINGERS IR SOLN
Status: DC | PRN
  Administered 2021-03-15: 3000 mL

## 2021-03-15 MED ORDER — HYDROMORPHONE HCL 1 MG/ML IJ SOLN
0.2500 mg | INTRAMUSCULAR | Status: DC | PRN
  Administered 2021-03-15 (×2): 0.25 mg via INTRAVENOUS

## 2021-03-15 MED ORDER — BUPIVACAINE-EPINEPHRINE (PF) 0.5% -1:200000 IJ SOLN
INTRAMUSCULAR | Status: AC
  Filled 2021-03-15: qty 30

## 2021-03-15 MED ORDER — PANTOPRAZOLE SODIUM 40 MG PO TBEC
40.0000 mg | DELAYED_RELEASE_TABLET | Freq: Every day | ORAL | Status: DC
  Administered 2021-03-16 – 2021-03-17 (×2): 40 mg via ORAL
  Filled 2021-03-15 (×2): qty 1

## 2021-03-15 MED ORDER — HYDROMORPHONE HCL 1 MG/ML IJ SOLN
INTRAMUSCULAR | Status: AC
  Filled 2021-03-15: qty 1

## 2021-03-15 MED ORDER — PROPOFOL 10 MG/ML IV BOLUS
INTRAVENOUS | Status: AC
  Filled 2021-03-15: qty 20

## 2021-03-15 MED ORDER — DOCUSATE SODIUM 100 MG PO CAPS
100.0000 mg | ORAL_CAPSULE | Freq: Two times a day (BID) | ORAL | Status: DC
  Administered 2021-03-15 – 2021-03-17 (×4): 100 mg via ORAL
  Filled 2021-03-15 (×4): qty 1

## 2021-03-15 MED ORDER — HEPARIN SODIUM (PORCINE) 1000 UNIT/ML IJ SOLN
INTRAMUSCULAR | Status: AC
  Filled 2021-03-15: qty 1

## 2021-03-15 MED ORDER — LORAZEPAM 1 MG PO TABS: 1.0000 mg | ORAL_TABLET | Freq: Every day | ORAL | Status: DC | PRN

## 2021-03-15 MED ORDER — LACTATED RINGERS IV SOLN: INTRAVENOUS | Status: DC

## 2021-03-15 MED ORDER — ONDANSETRON HCL 4 MG/2ML IJ SOLN
INTRAMUSCULAR | Status: AC
  Filled 2021-03-15: qty 2

## 2021-03-15 MED ORDER — SODIUM CHLORIDE 0.9 % IV SOLN: INTRAVENOUS | Status: DC

## 2021-03-15 MED ORDER — ROCURONIUM BROMIDE 10 MG/ML (PF) SYRINGE
PREFILLED_SYRINGE | INTRAVENOUS | Status: DC | PRN
Start: 2021-03-15 — End: 2021-03-15
  Administered 2021-03-15: 70 mg via INTRAVENOUS
  Administered 2021-03-15: 30 mg via INTRAVENOUS

## 2021-03-15 MED ORDER — DOCUSATE SODIUM 100 MG PO CAPS: 100.0000 mg | ORAL_CAPSULE | Freq: Two times a day (BID) | ORAL | Status: DC

## 2021-03-15 MED ORDER — DIPHENHYDRAMINE HCL 12.5 MG/5ML PO ELIX: 12.5000 mg | ORAL_SOLUTION | Freq: Four times a day (QID) | ORAL | Status: DC | PRN

## 2021-03-15 MED ORDER — SODIUM CHLORIDE (PF) 0.9 % IJ SOLN
INTRAMUSCULAR | Status: DC | PRN
  Administered 2021-03-15: 20 mL

## 2021-03-15 MED ORDER — PHENYLEPHRINE 40 MCG/ML (10ML) SYRINGE FOR IV PUSH (FOR BLOOD PRESSURE SUPPORT)
PREFILLED_SYRINGE | INTRAVENOUS | Status: DC | PRN
Start: 2021-03-15 — End: 2021-03-15
  Administered 2021-03-15 (×2): 80 ug via INTRAVENOUS

## 2021-03-15 MED ORDER — ACETAMINOPHEN 10 MG/ML IV SOLN
1000.0000 mg | Freq: Four times a day (QID) | INTRAVENOUS | Status: DC
  Administered 2021-03-15 – 2021-03-16 (×3): 1000 mg via INTRAVENOUS
  Filled 2021-03-15 (×3): qty 100

## 2021-03-15 MED ORDER — MOMETASONE FURO-FORMOTEROL FUM 100-5 MCG/ACT IN AERO
2.0000 | INHALATION_SPRAY | Freq: Two times a day (BID) | RESPIRATORY_TRACT | Status: DC
  Administered 2021-03-15 – 2021-03-17 (×3): 2 via RESPIRATORY_TRACT
  Filled 2021-03-15: qty 8.8

## 2021-03-15 MED ORDER — ORAL CARE MOUTH RINSE: 15.0000 mL | Freq: Once | OROMUCOSAL | Status: AC

## 2021-03-15 MED ORDER — SUGAMMADEX SODIUM 200 MG/2ML IV SOLN
INTRAVENOUS | Status: DC | PRN
  Administered 2021-03-15: 200 mg via INTRAVENOUS

## 2021-03-15 MED ORDER — SODIUM CHLORIDE (PF) 0.9 % IJ SOLN
INTRAMUSCULAR | Status: AC
  Filled 2021-03-15: qty 20

## 2021-03-15 MED ORDER — ONDANSETRON 4 MG PO TBDP
4.0000 mg | ORAL_TABLET | Freq: Three times a day (TID) | ORAL | Status: DC | PRN
  Administered 2021-03-17: 4 mg via ORAL
  Filled 2021-03-15: qty 1

## 2021-03-15 MED ORDER — INSULIN ASPART 100 UNIT/ML IJ SOLN
0.0000 [IU] | INTRAMUSCULAR | Status: DC
  Administered 2021-03-15 (×2): 2 [IU] via SUBCUTANEOUS

## 2021-03-15 MED ORDER — PROPOFOL 10 MG/ML IV BOLUS
INTRAVENOUS | Status: DC | PRN
  Administered 2021-03-15: 200 mg via INTRAVENOUS

## 2021-03-15 MED ORDER — FENTANYL CITRATE (PF) 250 MCG/5ML IJ SOLN
INTRAMUSCULAR | Status: AC
  Filled 2021-03-15: qty 5

## 2021-03-15 MED ORDER — STERILE WATER FOR IRRIGATION IR SOLN
Status: DC | PRN
  Administered 2021-03-15: 1000 mL

## 2021-03-15 MED ORDER — CEFAZOLIN SODIUM-DEXTROSE 1-4 GM/50ML-% IV SOLN
1.0000 g | Freq: Three times a day (TID) | INTRAVENOUS | Status: AC
  Administered 2021-03-15 – 2021-03-16 (×2): 1 g via INTRAVENOUS
  Filled 2021-03-15 (×2): qty 50

## 2021-03-15 MED ORDER — DIPHENHYDRAMINE HCL 50 MG/ML IJ SOLN: 12.5000 mg | Freq: Four times a day (QID) | INTRAMUSCULAR | Status: DC | PRN

## 2021-03-15 MED ORDER — CHLORHEXIDINE GLUCONATE CLOTH 2 % EX PADS
6.0000 | MEDICATED_PAD | Freq: Every day | CUTANEOUS | Status: DC
  Administered 2021-03-15 – 2021-03-16 (×2): 6 via TOPICAL

## 2021-03-15 MED ORDER — FENTANYL CITRATE (PF) 100 MCG/2ML IJ SOLN
INTRAMUSCULAR | Status: DC | PRN
Start: 2021-03-15 — End: 2021-03-15
  Administered 2021-03-15: 100 ug via INTRAVENOUS
  Administered 2021-03-15: 50 ug via INTRAVENOUS
  Administered 2021-03-15: 100 ug via INTRAVENOUS

## 2021-03-15 MED ORDER — BUPIVACAINE-EPINEPHRINE (PF) 0.25% -1:200000 IJ SOLN
INTRAMUSCULAR | Status: AC
  Filled 2021-03-15: qty 30

## 2021-03-15 MED ORDER — ACETAMINOPHEN 500 MG PO TABS
1000.0000 mg | ORAL_TABLET | Freq: Once | ORAL | Status: AC
  Administered 2021-03-15: 1000 mg via ORAL
  Filled 2021-03-15: qty 2

## 2021-03-15 MED ORDER — ALBUTEROL SULFATE (2.5 MG/3ML) 0.083% IN NEBU: 2.5000 mg | INHALATION_SOLUTION | Freq: Four times a day (QID) | RESPIRATORY_TRACT | Status: DC | PRN

## 2021-03-15 MED ORDER — CHLORHEXIDINE GLUCONATE 0.12 % MT SOLN
15.0000 mL | Freq: Once | OROMUCOSAL | Status: AC
  Administered 2021-03-15: 15 mL via OROMUCOSAL

## 2021-03-15 MED ORDER — CEFAZOLIN SODIUM-DEXTROSE 2-4 GM/100ML-% IV SOLN
2.0000 g | Freq: Once | INTRAVENOUS | Status: AC
  Administered 2021-03-15: 2 g via INTRAVENOUS
  Filled 2021-03-15: qty 100

## 2021-03-15 MED ORDER — LIDOCAINE 2% (20 MG/ML) 5 ML SYRINGE
INTRAMUSCULAR | Status: DC | PRN
  Administered 2021-03-15: 100 mg via INTRAVENOUS

## 2021-03-15 MED ORDER — BUPIVACAINE LIPOSOME 1.3 % IJ SUSP
INTRAMUSCULAR | Status: AC
  Filled 2021-03-15: qty 20

## 2021-03-15 MED ORDER — MORPHINE SULFATE (PF) 2 MG/ML IV SOLN
2.0000 mg | INTRAVENOUS | Status: DC | PRN
  Administered 2021-03-15 – 2021-03-16 (×3): 2 mg via INTRAVENOUS
  Filled 2021-03-15 (×4): qty 1

## 2021-03-15 MED ORDER — BUPIVACAINE LIPOSOME 1.3 % IJ SUSP
INTRAMUSCULAR | Status: DC | PRN
  Administered 2021-03-15: 20 mL

## 2021-03-15 MED ORDER — MIDAZOLAM HCL 5 MG/5ML IJ SOLN
INTRAMUSCULAR | Status: DC | PRN
  Administered 2021-03-15: 2 mg via INTRAVENOUS

## 2021-03-15 MED ORDER — DEXAMETHASONE SODIUM PHOSPHATE 10 MG/ML IJ SOLN
INTRAMUSCULAR | Status: DC | PRN
  Administered 2021-03-15: 5 mg via INTRAVENOUS

## 2021-03-15 MED ORDER — TRAMADOL HCL 50 MG PO TABS
50.0000 mg | ORAL_TABLET | Freq: Four times a day (QID) | ORAL | 0 refills | Status: DC | PRN
Start: 2021-03-15 — End: 2021-04-09

## 2021-03-15 MED ORDER — MIDAZOLAM HCL 2 MG/2ML IJ SOLN
INTRAMUSCULAR | Status: AC
  Filled 2021-03-15: qty 2

## 2021-03-15 MED ORDER — ONDANSETRON HCL 4 MG/2ML IJ SOLN
INTRAMUSCULAR | Status: DC | PRN
Start: 2021-03-15 — End: 2021-03-15
  Administered 2021-03-15: 4 mg via INTRAVENOUS

## 2021-03-15 MED ORDER — ONDANSETRON HCL 4 MG/2ML IJ SOLN
4.0000 mg | INTRAMUSCULAR | Status: DC | PRN
  Administered 2021-03-15: 4 mg via INTRAVENOUS
  Filled 2021-03-15: qty 2

## 2021-03-15 SURGICAL SUPPLY — 55 items
APPLICATOR SURGIFLO ENDO (HEMOSTASIS) IMPLANT
BAG COUNTER SPONGE SURGICOUNT (BAG) IMPLANT
CHLORAPREP W/TINT 26 (MISCELLANEOUS) ×2 IMPLANT
CLIP LIGATING HEM O LOK PURPLE (MISCELLANEOUS) ×2 IMPLANT
CLIP LIGATING HEMO O LOK GREEN (MISCELLANEOUS) ×4 IMPLANT
COVER SURGICAL LIGHT HANDLE (MISCELLANEOUS) ×2 IMPLANT
COVER TIP SHEARS 8 DVNC (MISCELLANEOUS) ×1 IMPLANT
COVER TIP SHEARS 8MM DA VINCI (MISCELLANEOUS) ×1
DECANTER SPIKE VIAL GLASS SM (MISCELLANEOUS) ×2 IMPLANT
DERMABOND ADVANCED (GAUZE/BANDAGES/DRESSINGS) ×2
DERMABOND ADVANCED .7 DNX12 (GAUZE/BANDAGES/DRESSINGS) ×2 IMPLANT
DRAIN CHANNEL 15F RND FF 3/16 (WOUND CARE) ×2 IMPLANT
DRAPE ARM DVNC X/XI (DISPOSABLE) ×4 IMPLANT
DRAPE COLUMN DVNC XI (DISPOSABLE) ×1 IMPLANT
DRAPE DA VINCI XI ARM (DISPOSABLE) ×4
DRAPE DA VINCI XI COLUMN (DISPOSABLE) ×1
DRAPE INCISE IOBAN 66X45 STRL (DRAPES) ×2 IMPLANT
DRAPE SHEET LG 3/4 BI-LAMINATE (DRAPES) ×2 IMPLANT
DRSG TEGADERM 4X4.75 (GAUZE/BANDAGES/DRESSINGS) ×1 IMPLANT
ELECT PENCIL ROCKER SW 15FT (MISCELLANEOUS) ×2 IMPLANT
ELECT REM PT RETURN 15FT ADLT (MISCELLANEOUS) ×2 IMPLANT
EVACUATOR SILICONE 100CC (DRAIN) ×2 IMPLANT
GLOVE SURG ENC MOIS LTX SZ6.5 (GLOVE) ×2 IMPLANT
GLOVE SURG ENC TEXT LTX SZ7.5 (GLOVE) ×4 IMPLANT
GOWN STRL REUS W/TWL LRG LVL3 (GOWN DISPOSABLE) ×6 IMPLANT
HEMOSTAT SURGICEL 4X8 (HEMOSTASIS) IMPLANT
HOLDER FOLEY CATH W/STRAP (MISCELLANEOUS) ×2 IMPLANT
IRRIG SUCT STRYKERFLOW 2 WTIP (MISCELLANEOUS) ×2
IRRIGATION SUCT STRKRFLW 2 WTP (MISCELLANEOUS) ×1 IMPLANT
KIT BASIN OR (CUSTOM PROCEDURE TRAY) ×2 IMPLANT
KIT TURNOVER KIT A (KITS) IMPLANT
POUCH SPECIMEN RETRIEVAL 10MM (ENDOMECHANICALS) ×2 IMPLANT
PROTECTOR NERVE ULNAR (MISCELLANEOUS) ×4 IMPLANT
SEAL CANN UNIV 5-8 DVNC XI (MISCELLANEOUS) ×4 IMPLANT
SEAL XI 5MM-8MM UNIVERSAL (MISCELLANEOUS) ×4
SET TUBE SMOKE EVAC HIGH FLOW (TUBING) ×2 IMPLANT
SOLUTION ELECTROLUBE (MISCELLANEOUS) ×2 IMPLANT
SURGIFLO W/THROMBIN 8M KIT (HEMOSTASIS) ×2 IMPLANT
SUT ETHILON 3 0 PS 1 (SUTURE) ×3 IMPLANT
SUT MNCRL AB 4-0 PS2 18 (SUTURE) ×4 IMPLANT
SUT PDS PLUS 0 (SUTURE) ×3
SUT PDS PLUS AB 0 CT-2 (SUTURE) ×2 IMPLANT
SUT V-LOC BARB 180 2/0GR6 GS22 (SUTURE) ×2
SUT VIC AB 0 CT1 27 (SUTURE) ×1
SUT VIC AB 0 CT1 27XBRD ANTBC (SUTURE) ×1 IMPLANT
SUT VLOC BARB 180 ABS3/0GR12 (SUTURE) ×2
SUTURE V-LC BRB 180 2/0GR6GS22 (SUTURE) ×1 IMPLANT
SUTURE VLOC BRB 180 ABS3/0GR12 (SUTURE) ×1 IMPLANT
TOWEL OR 17X26 10 PK STRL BLUE (TOWEL DISPOSABLE) ×2 IMPLANT
TOWEL OR NON WOVEN STRL DISP B (DISPOSABLE) ×2 IMPLANT
TRAY FOLEY MTR SLVR 16FR STAT (SET/KITS/TRAYS/PACK) ×2 IMPLANT
TRAY LAPAROSCOPIC (CUSTOM PROCEDURE TRAY) ×2 IMPLANT
TROCAR 12M 150ML BLUNT (TROCAR) ×1 IMPLANT
TROCAR XCEL 12X100 BLDLESS (ENDOMECHANICALS) ×2 IMPLANT
WATER STERILE IRR 1000ML POUR (IV SOLUTION) ×2 IMPLANT

## 2021-03-15 NOTE — Progress Notes (Signed)
RN reviewed and educated patient on use of incentive spirometry. All questions addressed. Pt and spouse verbalized understanding.

## 2021-03-15 NOTE — Interval H&P Note (Signed)
History and Physical Interval Note:  03/15/2021 6:48 AM  Michelle Lamb  has presented today for surgery, with the diagnosis of RIGHT RENAL ANGIOMYOLIPOMA.  The various methods of treatment have been discussed with the patient and family. After consideration of risks, benefits and other options for treatment, the patient has consented to  Procedure(s): XI ROBOTIC ASSITED PARTIAL NEPHRECTOMY (Right) as a surgical intervention.  The patient's history has been reviewed, patient examined, no change in status, stable for surgery.  I have reviewed the patient's chart and labs.  Questions were answered to the patient's satisfaction.     [employer]

## 2021-03-15 NOTE — Plan of Care (Signed)
°  Problem: Education: Goal: Knowledge of the prescribed therapeutic regimen will improve Outcome: Progressing   Problem: Bowel/Gastric: Goal: Gastrointestinal status for postoperative course will improve Outcome: Progressing   Problem: Clinical Measurements: Goal: Postoperative complications will be avoided or minimized Outcome: Progressing   Problem: Respiratory: Goal: Ability to achieve and maintain a regular respiratory rate will improve Outcome: Progressing   Problem: Skin Integrity: Goal: Demonstration of wound healing without infection will improve Outcome: Progressing   Problem: Urinary Elimination: Goal: Ability to avoid or minimize complications of infection will improve Outcome: Progressing Goal: Ability to achieve and maintain urine output will improve Outcome: Progressing

## 2021-03-15 NOTE — Progress Notes (Signed)
Patient ID: Michelle Lamb, female   DOB: March 07, 1990, 31 y.o.   MRN: [id number]  Post-op note  Subjective: The patient is doing well.  Complains of incisional and right flank pain.  Objective: Vital signs in last 24 hours: Temp:  [97 F (36.1 C)-98.3 F (36.8 C)] 97.5 F (36.4 C) (01/16 1205) Pulse Rate:  [63-82] 77 (01/16 1205) Resp:  [12-19] 19 (01/16 1205) BP: (118-139)/(67-83) 128/79 (01/16 1205) SpO2:  [99 %-100 %] 100 % (01/16 1205) Weight:  [88.5 kg] 88.5 kg (01/16 0538)  Intake/Output from previous day: No intake/output data recorded. Intake/Output this shift: Total I/O In: 2000 [I.V.:2000] Out: 350 [Urine:200; Drains:100; Blood:50]  Physical Exam:  General: Alert and oriented. Abdomen: Soft, Nondistended. Incisions: Clean and dry.  Lab Results: Recent Labs    03/15/21 1050  HGB 11.4*  HCT 36.1    Assessment/Plan: POD#0   1) Continue to monitor, OOB to chair later tonight   Michelle Lamb. MD   LOS: 0 days   Michelle Lamb 03/15/2021, 2:48 PM

## 2021-03-15 NOTE — Op Note (Signed)
Michelle KitchenPreoperative diagnosis: Right renal neoplasm  Postoperative diagnosis: Right renal neoplasm  Procedure:  Right robotic-assisted laparoscopic partial nephrectomy  Surgeon: Pryor Curia. M.D.  Assistant(s): Debbrah Alar, PA-C  An assistant was required for this surgical procedure.  The duties of the assistant included but were not limited to suctioning, passing suture, camera manipulation, retraction. This procedure would not be able to be performed without an Environmental consultant.  Anesthesia: General  Complications: None  EBL: 100 mL  IVF:  2000 mL crystalloid  Specimens: Right renal neoplasm  Disposition of specimens: Pathology  Intraoperative findings:       1. Warm renal ischemia time: 10 minutes  Drains: # 15 Blake perinephric drain  Indication:  Michelle Lamb is a 31 y.o. year old patient with a right renal mass.  After a thorough review of the management options for their renal mass, they elected to proceed with surgical treatment and the above procedure.  We have discussed the potential benefits and risks of the procedure, side effects of the proposed treatment, the likelihood of the patient achieving the goals of the procedure, and any potential problems that might occur during the procedure or recuperation. Informed consent has been obtained.   Description of procedure:  The patient was taken to the operating room and a general anesthetic was administered. The patient was given preoperative antibiotics, placed in the right modified flank position with care to pad all potential pressure points, and prepped and draped in the usual sterile fashion. Next a preoperative timeout was performed.  A site was selected on in the midline for placement of the assistant port. This was placed using a standard open Hassan technique which allowed entry into the peritoneal cavity under direct vision and without difficulty. A 12 mm port was placed and a pneumoperitoneum established. The  camera was then used to inspect the abdomen and there was no evidence of any intra-abdominal injuries or other abnormalities. The remaining abdominal ports were then placed. 8 mm robotic ports were placed in the right upper quadrant, right lower quadrant, and far right lateral abdominal wall. An 8 mm port was placed for the camera site just right of the umbilicus. All ports were placed under direct vision without difficulty. The surgical cart was then docked.   Utilizing the cautery scissors, the white line of Toldt was incised allowing the colon to be mobilized medially and the plane between the mesocolon and the anterior layer of Gerotas fascia to be developed and the kidney to be exposed.  The ureter and gonadal vein were identified inferiorly and the ureter was lifted anteriorly off the psoas muscle.  Dissection proceeded superiorly along the gonadal vein until the renal vein was identified.  The renal hilum was then carefully isolated with a combination of blunt and sharp dissection allowing the renal arterial and venous structures to be separated and isolated in preparation for renal hilar vessel clamping. There was a single renal artery and renal vein.    Attention turned to the kidney and the perinephric fat surrounding the renal mass was removed and the kidney was mobilized sufficiently for exposure and resection of the renal mass.    Once the renal mass was properly isolated, preparations were made for resection of the tumor.  Reconstructive sutures were placed into the abdomen for the renorrhaphy portion of the procedure.  The renal artery was then clamped with bulldog clamps.  The tumor was then excised with cold scissor dissection along with an adequate visible gross margin of  normal renal parenchyma. The tumor appeared to be excised without any gross violation of the tumor. The renal collecting system was not entered during removal of the tumor.  A running 3-0 V-lock suture was then brought  through the capsule of the kidney and run along the base of the renal defect to provide hemostasis and close any entry into the renal collecting system if present. Weck clips were used to secure this suture outside the renal capsule at the proximal and distal ends. An additional hemostatic agent (Surgiflo) was then placed into the renal defect. A running 2-0 V lock suture was then used to close the capsule of the kidney using a sliding clip technique which resulted in excellent hemostasis.    The bulldog clamps were then removed from the renal hilar vessel(s).. Total warm renal ischemia time was 10 minutes. The renal tumor resection site was examined. Hemostasis appeared adequate.   The kidney was placed back into its normal anatomic position and covered with perinephric fat as needed.  A # 67 Blake drain was then brought through the lateral lower port site and positioned in the perinephric space.  It was secured to the skin with a nylon suture. The surgical cart was undocked.  The renal tumor specimen was removed intact within an endopouch retrieval bag via the upper midline port site. This incision site was closed at the fascial layer with 0-vicryl suture. All other laparoscopic/robotic ports were removed under direct vision and the pneumoperitoneum let down with inspection of the operative field performed and hemostasis again confirmed. All incision sites were then injected with local anesthetic and reapproximated at the skin level with 4-0 monocryl subcuticular closures.  Dermabond was applied to the skin.  The patient tolerated the procedure well and without complications.  The patient was able to be extubated and transferred to the recovery unit in satisfactory condition.  Pryor Curia MD

## 2021-03-15 NOTE — Anesthesia Procedure Notes (Signed)
Procedure Name: Intubation Date/Time: 03/15/2021 7:43 AM Performed by: Gean Maidens, CRNA Pre-anesthesia Checklist: Patient identified, Emergency Drugs available, Suction available, Patient being monitored and Timeout performed Patient Re-evaluated:Patient Re-evaluated prior to induction Oxygen Delivery Method: Circle system utilized Preoxygenation: Pre-oxygenation with 100% oxygen Induction Type: IV induction Ventilation: Mask ventilation without difficulty Laryngoscope Size: Miller and 4 Grade View: Grade I Tube type: Oral Tube size: 7.0 mm Number of attempts: 1 Airway Equipment and Method: Stylet Placement Confirmation: ETT inserted through vocal cords under direct vision, positive ETCO2 and breath sounds checked- equal and bilateral Secured at: 21 cm Tube secured with: Tape Dental Injury: Teeth and Oropharynx as per pre-operative assessment

## 2021-03-15 NOTE — Anesthesia Postprocedure Evaluation (Signed)
Anesthesia Post Note  Patient: Michelle Lamb  Procedure(s) Performed: XI ROBOTIC ASSITED PARTIAL NEPHRECTOMY (Right)     Patient location during evaluation: PACU Anesthesia Type: General Level of consciousness: awake and alert Pain management: pain level controlled Vital Signs Assessment: post-procedure vital signs reviewed and stable Respiratory status: spontaneous breathing, nonlabored ventilation and respiratory function stable Cardiovascular status: blood pressure returned to baseline and stable Postop Assessment: no apparent nausea or vomiting Anesthetic complications: no   No notable events documented.  Last Vitals:  Vitals:   03/15/21 1045 03/15/21 1100  BP: 137/83 131/80  Pulse: 68 64  Resp: 14 14  Temp:    SpO2: 100% 100%    Last Pain:  Vitals:   03/15/21 1100  TempSrc:   PainSc: 0-No pain                 Jacoba Cherney,W. EDMOND

## 2021-03-15 NOTE — Progress Notes (Signed)
Pt up to chair with RN assistance for supper. Pt tolerated well with c/o soreness. Premedicated with pain medication per MD orders.

## 2021-03-15 NOTE — Transfer of Care (Signed)
Immediate Anesthesia Transfer of Care Note  Patient: Michelle Lamb  Procedure(s) Performed: XI ROBOTIC ASSITED PARTIAL NEPHRECTOMY (Right)  Patient Location: PACU  Anesthesia Type:General  Level of Consciousness: sedated, patient cooperative and responds to stimulation  Airway & Oxygen Therapy: Patient Spontanous Breathing and Patient connected to face mask oxygen  Post-op Assessment: Report given to RN and Post -op Vital signs reviewed and stable  Post vital signs: Reviewed and stable  Last Vitals:  Vitals Value Taken Time  BP    Temp    Pulse 71 03/15/21 1022  Resp 15 03/15/21 1022  SpO2 100 % 03/15/21 1022  Vitals shown include unvalidated device data.  Last Pain:  Vitals:   03/15/21 0613  TempSrc: Oral  PainSc:       Patients Stated Pain Goal: 3 (30/86/57 [id number])  Complications: No notable events documented.

## 2021-03-15 NOTE — Discharge Instructions (Signed)

## 2021-03-16 ENCOUNTER — Encounter (HOSPITAL_COMMUNITY): Payer: Self-pay | Admitting: Urology

## 2021-03-16 DIAGNOSIS — D1771 Benign lipomatous neoplasm of kidney: Secondary | ICD-10-CM | POA: Diagnosis not present

## 2021-03-16 LAB — GLUCOSE, CAPILLARY
Glucose-Capillary: 68 mg/dL — ABNORMAL LOW (ref 70–99)
Glucose-Capillary: 69 mg/dL — ABNORMAL LOW (ref 70–99)
Glucose-Capillary: 72 mg/dL (ref 70–99)
Glucose-Capillary: 82 mg/dL (ref 70–99)
Glucose-Capillary: 86 mg/dL (ref 70–99)
Glucose-Capillary: 87 mg/dL (ref 70–99)
Glucose-Capillary: 90 mg/dL (ref 70–99)
Glucose-Capillary: 96 mg/dL (ref 70–99)

## 2021-03-16 LAB — BASIC METABOLIC PANEL
Anion gap: 6 (ref 5–15)
BUN: 9 mg/dL (ref 6–20)
CO2: 23 mmol/L (ref 22–32)
Calcium: 8.2 mg/dL — ABNORMAL LOW (ref 8.9–10.3)
Chloride: 106 mmol/L (ref 98–111)
Creatinine, Ser: 0.66 mg/dL (ref 0.44–1.00)
GFR, Estimated: >60 mL/min (ref >60)
Glucose, Bld: 68 mg/dL — ABNORMAL LOW (ref 70–99)
Potassium: 3.8 mmol/L (ref 3.5–5.1)
Sodium: 135 mmol/L (ref 135–145)

## 2021-03-16 LAB — CREATININE, FLUID (PLEURAL, PERITONEAL, JP DRAINAGE): Creat, Fluid: 0.7 mg/dL

## 2021-03-16 LAB — HEMOGLOBIN AND HEMATOCRIT, BLOOD
HCT: 33.9 % — ABNORMAL LOW (ref 36.0–46.0)
Hemoglobin: 10.8 g/dL — ABNORMAL LOW (ref 12.0–15.0)

## 2021-03-16 MED ORDER — ACETAMINOPHEN 325 MG PO TABS: 650.0000 mg | ORAL_TABLET | Freq: Four times a day (QID) | ORAL | Status: DC | PRN

## 2021-03-16 MED ORDER — TRAMADOL HCL 50 MG PO TABS
50.0000 mg | ORAL_TABLET | Freq: Four times a day (QID) | ORAL | Status: DC | PRN
  Administered 2021-03-16 (×2): 100 mg via ORAL
  Filled 2021-03-16 (×2): qty 2

## 2021-03-16 MED ORDER — ACETAMINOPHEN 325 MG PO TABS
650.0000 mg | ORAL_TABLET | Freq: Four times a day (QID) | ORAL | Status: DC | PRN
  Administered 2021-03-16: 650 mg via ORAL
  Filled 2021-03-16: qty 2

## 2021-03-16 NOTE — Progress Notes (Signed)
Inpatient Diabetes Program Recommendations  AACE/[name]: New Consensus Statement on Inpatient Glycemic Control  Target Ranges:  Prepandial:   less than 140 mg/dL      Peak postprandial:   less than 180 mg/dL (1-2 hours)      Critically ill patients:  140 - 180 mg/dL    Latest Reference Range & Units 03/16/21 00:42 03/16/21 04:17 03/16/21 04:48 03/16/21 07:51  Glucose-Capillary 70 - 99 mg/dL 87 69 (L) 82 68 (L)     Latest Reference Range & Units 03/15/21 12:27 03/15/21 16:04 03/15/21 19:27  Glucose-Capillary 70 - 99 mg/dL 95 148 (H) 147 (H)   Review of Glycemic Control  Diabetes history: DM2 Outpatient Diabetes medications: Ozempic 1 mg Qweek Current orders for Inpatient glycemic control: Novolog 0-15 units Q4H  Inpatient Diabetes Program Recommendations:    Insulin: Please consider changing CBGs to AC&HS and decreasing Novolog correction to 0-9 units TID with meals and Novolog 0-5 units QHS.  NOTE: In reviewing chart, noted patient had gastric bypass in April 2022 at [facility]. At time of discharge on 06/05/20, DM medications listed as Metformin XR 138 mg BID and Trulicity 3 mg Qweek. However per office note on 09/30/20 by S. Rosana Hoes, [place], "had gastric bypass surgery on April 5th and has not been on glucophage or trulicity since that time - recent labwork shows glucose at 88 and hgb a1c normal at 5.4- recommend to stay off medication at this time." Per note on 12/25/20 by Dr. Toney Rakes patient had been started on Ozempic 1 mg Qweek for fatty liver dx 3 weeks prior to visit.   Thanks, Barnie Alderman, RN, MSN, CDE Diabetes Coordinator Inpatient Diabetes Program [phone] (Team Pager from 8am to 5pm)

## 2021-03-16 NOTE — Progress Notes (Signed)
Patient ID: Michelle Lamb, female   DOB: [birth year], 31 y.o.   MRN: [id number]  1 Day Post-Op Subjective: Complains of incisional and right flank pain.  Mild nausea.  Passing flatus.  Objective: Vital signs in last 24 hours: Temp:  [97 F (36.1 C)-98.3 F (36.8 C)] 98.3 F (36.8 C) (01/17 0428) Pulse Rate:  [63-77] 75 (01/17 0428) Resp:  [12-19] 17 (01/17 0000) BP: (119-139)/(67-86) 126/86 (01/17 0428) SpO2:  [97 %-100 %] 98 % (01/17 0428)  Intake/Output from previous day: 01/16 0701 - 01/17 0700 In: 2717.6 [P.O.:478; I.V.:2139.6; IV Piggyback:100] Out: 1270 [Urine:1050; Drains:170; Blood:50] Intake/Output this shift: No intake/output data recorded.  Physical Exam:  General: Alert and oriented Abdomen: Soft, ND, positive BS Incisions: C/D/I Ext: NT, No erythema  Lab Results: Recent Labs    03/15/21 1050 03/16/21 0418  HGB 11.4* 10.8*  HCT 36.1 33.9*   BMET Recent Labs    03/15/21 1050 03/16/21 0418  NA 135 135  K 3.4* 3.8  CL 104 106  CO2 24 23  GLUCOSE 106* 68*  BUN 11 9  CREATININE 0.59 0.66  CALCIUM 8.2* 8.2*     Studies/Results: No results found.  Assessment/Plan: POD # 1 s/p right RAL partial nephrectomy - Ambulate, IS - Advance diet as tolerated - Oral pain medication - D/C Foley - Check drain Cr - Path pending   LOS: 0 days   Dutch Gray 03/16/2021, 7:16 AM

## 2021-03-16 NOTE — Progress Notes (Signed)
Pt was seen up and ambulating hall with spouse.

## 2021-03-17 DIAGNOSIS — D1771 Benign lipomatous neoplasm of kidney: Secondary | ICD-10-CM | POA: Diagnosis not present

## 2021-03-17 LAB — BASIC METABOLIC PANEL
Anion gap: 5 (ref 5–15)
BUN: 12 mg/dL (ref 6–20)
CO2: 23 mmol/L (ref 22–32)
Calcium: 8.1 mg/dL — ABNORMAL LOW (ref 8.9–10.3)
Chloride: 105 mmol/L (ref 98–111)
Creatinine, Ser: 0.66 mg/dL (ref 0.44–1.00)
GFR, Estimated: >60 mL/min (ref >60)
Glucose, Bld: 90 mg/dL (ref 70–99)
Potassium: 3.5 mmol/L (ref 3.5–5.1)
Sodium: 133 mmol/L — ABNORMAL LOW (ref 135–145)

## 2021-03-17 LAB — GLUCOSE, CAPILLARY
Glucose-Capillary: 74 mg/dL (ref 70–99)
Glucose-Capillary: 78 mg/dL (ref 70–99)

## 2021-03-17 NOTE — Discharge Summary (Signed)
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 Briefly, Michelle Lamb is a 31 y.o. 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   Hospital Course: She underwent a right RAL partial nephrectomy on 03/15/21.  She tolerated this procedure well.  She remained hemodynamically stable overnight.  She was able to ambulate, was transitioned to oral pain medication, and tolerated a diet.  Her renal function remained stable.  Her drain Cr was c/w serum and her drain was removed.  She was stable for discharge on POD # 2.  Laboratory values:  Recent Labs    03/15/21 1050 03/16/21 0418  HGB 11.4* 10.8*  HCT 36.1 33.9*   Recent Labs    03/16/21 0418 03/17/21 0435  CREATININE 0.66 0.66    Disposition: Home  Discharge instruction: The patient was instructed to be ambulatory but told to refrain from heavy lifting, strenuous activity, or driving.   Discharge medications:  Allergies as of 03/17/2021   No Known Allergies      Medication List     STOP taking these medications    CALCIUM 600 PO   cyanocobalamin 1000 MCG/ML injection Commonly known as: (VITAMIN B-12)   cyclobenzaprine 10 MG tablet Commonly known as: FLEXERIL   doxycycline 100 MG tablet Commonly known as: VIBRA-TABS   HYDROcodone bit-homatropine 5-1.5 MG/5ML syrup Commonly known as: Hydromet   multivitamin with minerals Tabs tablet   oseltamivir 75 MG capsule Commonly known as: Tamiflu   Vitamin D (Ergocalciferol) 1.25 MG (50000 UNIT) Caps capsule Commonly known as: DRISDOL       TAKE these medications    albuterol 108 (90 Base) MCG/ACT inhaler Commonly known as: VENTOLIN HFA Inhale 2 puffs into the lungs every 6 (six) hours as needed for  wheezing or shortness of breath.   budesonide-formoterol 80-4.5 MCG/ACT inhaler Commonly known as: SYMBICORT Inhale 2 puffs into the lungs 2 (two) times daily as needed (shortness of breath).   docusate sodium 100 MG capsule Commonly known as: COLACE Take 1 capsule (100 mg total) by mouth 2 (two) times daily.   gabapentin 100 MG capsule Commonly known as: NEURONTIN Take 100 mg by mouth daily as needed (pain).   lactulose 10 GM/15ML solution Commonly known as: CHRONULAC Take 10 g by mouth daily as needed for moderate constipation.   LORazepam 1 MG tablet Commonly known as: ATIVAN Take 1 tablet (1 mg total) by mouth daily as needed.   omeprazole 20 MG capsule Commonly known as: PRILOSEC Take twice daily while taking prednisone What changed:  how much to take how to take this when to take this additional instructions   ondansetron 4 MG disintegrating tablet Commonly known as: ZOFRAN-ODT Take 1 tablet (4 mg total) by mouth every 8 (eight) hours as needed for nausea or vomiting.   Ozempic (1 MG/DOSE) 4 MG/3ML Sopn Generic drug: Semaglutide (1 MG/DOSE) Inject 1 mg into the skin once a week.   traMADol 50 MG tablet Commonly known as: Ultram Take 1-2 tablets (50-100 mg total) by mouth every 6 (six) hours as needed for moderate pain or severe pain.        Followup:   Follow-up Information     Raynelle Bring, MD Follow up on 04/13/2021.   Specialty: Urology Why: at 1:30 Contact information: [address]  [place] [address] [phone]

## 2021-03-17 NOTE — Progress Notes (Signed)
Patient ID: Michelle Lamb, female   DOB: 01-Jun-1990, 31 y.o.   To whom it may concern:  This note is to document that Michelle Lamb was in the hospital for surgery from 03/15/21 - 03/17/21.  Please excuse her and her husband as they were in the hospital these days.   Dr. Raynelle Bring

## 2021-03-17 NOTE — Progress Notes (Signed)
°  Transition of Care [facility]) Screening Note   Patient Details  Name: Michelle Lamb Date of Birth: 1990-05-28   Transition of Care [facility]) CM/SW Contact:    Dessa Phi, RN Phone Number: 03/17/2021, 10:48 AM    Transition of Care Department [facility]) has reviewed patient and no TOC needs have been identified at this time. We will continue to monitor patient advancement through interdisciplinary progression rounds. If new patient transition needs arise, please place a TOC consult.

## 2021-03-17 NOTE — Progress Notes (Signed)
Patient ID: Michelle Lamb, female   DOB: [birth year], 31 y.o.   MRN: [id number]  2 Days Post-Op Subjective: Pt doing better.  Ambulating and tolerating diet.  Pain better controlled. Voiding well.  Drain removed last night.  Objective: Vital signs in last 24 hours: Temp:  [97.7 F (36.5 C)-98.9 F (37.2 C)] 98.7 F (37.1 C) (01/18 0414) Pulse Rate:  [75-97] 97 (01/18 0414) Resp:  [16-18] 18 (01/18 0414) BP: (108-127)/(71-79) 126/79 (01/18 0414) SpO2:  [94 %-99 %] 94 % (01/18 0414)  Intake/Output from previous day: 01/17 0701 - 01/18 0700 In: 118 [P.O.:118] Out: 330 [Urine:300; Drains:30] Intake/Output this shift: No intake/output data recorded.  Physical Exam:  General: Alert and oriented Abdomen: Soft, ND, moderate flank and incisional tenderness Incisions: C/D/I Ext: NT, No erythema  Lab Results: Recent Labs    03/15/21 1050 03/16/21 0418  HGB 11.4* 10.8*  HCT 36.1 33.9*   BMET Recent Labs    03/16/21 0418 03/17/21 0435  NA 135 133*  K 3.8 3.5  CL 106 105  CO2 23 23  GLUCOSE 68* 90  BUN 9 12  CREATININE 0.66 0.66  CALCIUM 8.2* 8.1*     Studies/Results: No results found.  Assessment/Plan: POD # 2 s/p right RAL partial nephrectomy - D/C home    LOS: 0 days   Dutch Gray 03/17/2021, 7:22 AM

## 2021-03-18 LAB — SURGICAL PATHOLOGY

## 2021-04-07 ENCOUNTER — Ambulatory Visit: Payer: Medicaid Other | Admitting: Family Medicine

## 2021-04-07 ENCOUNTER — Ambulatory Visit: Payer: Medicaid Other | Admitting: Physician Assistant

## 2021-04-08 ENCOUNTER — Ambulatory Visit: Payer: No Typology Code available for payment source | Admitting: Physician Assistant

## 2021-04-09 ENCOUNTER — Ambulatory Visit (INDEPENDENT_AMBULATORY_CARE_PROVIDER_SITE_OTHER): Payer: No Typology Code available for payment source | Admitting: Physician Assistant

## 2021-04-09 ENCOUNTER — Encounter: Payer: Self-pay | Admitting: Physician Assistant

## 2021-04-09 ENCOUNTER — Other Ambulatory Visit: Payer: Self-pay

## 2021-04-09 VITALS — BP 128/82 | HR 107 | Temp 98.8°F | Ht 62.0 in | Wt 188.0 lb

## 2021-04-09 DIAGNOSIS — E559 Vitamin D deficiency, unspecified: Secondary | ICD-10-CM

## 2021-04-09 DIAGNOSIS — R5381 Other malaise: Secondary | ICD-10-CM

## 2021-04-09 DIAGNOSIS — D49511 Neoplasm of unspecified behavior of right kidney: Secondary | ICD-10-CM | POA: Diagnosis not present

## 2021-04-09 DIAGNOSIS — D513 Other dietary vitamin B12 deficiency anemia: Secondary | ICD-10-CM

## 2021-04-09 DIAGNOSIS — E782 Mixed hyperlipidemia: Secondary | ICD-10-CM | POA: Diagnosis not present

## 2021-04-09 DIAGNOSIS — K760 Fatty (change of) liver, not elsewhere classified: Secondary | ICD-10-CM

## 2021-04-09 DIAGNOSIS — R160 Hepatomegaly, not elsewhere classified: Secondary | ICD-10-CM

## 2021-04-09 DIAGNOSIS — K769 Liver disease, unspecified: Secondary | ICD-10-CM

## 2021-04-09 NOTE — Progress Notes (Signed)
Subjective:  Patient ID: Michelle Lamb, female    DOB: 1990/12/06  Age: 31 y.o. MRN: [id number]  Chief Complaint  Patient presents with   Follow up   Pt recently had partial right nephrectomy with Dr Alinda Money [facility] on 2/14 - she is healing well and voices no concerns with surgery at this time besides some abdominal scarring and sutures - will follow up with his office next week  Pt with history of fatty liver - was started on ozempic and currently at 1mg  weekly dose which caused some nausea - recommend to try one more dose at that level but if nausea persistent would decrease back down to 0.5mg  weekly  Pt had what appeared to be hepatic adenoma on abdominal MRI and is due to recheck imaging to ensure stability of this area - will schedule   Pt with history of low vitamin D - currently she is on ergocalciferol 50000units twice weekly - due for labwork  Pt with history of hyperlipidemia - she is not currently on medication and able to control with diet since her weight loss surgery  Pt with history of diabetes diagnosed in 2015 - she has since had gastric bypass surgery on April 5th and has not been on glucophage or trulicity since that time - as stated above she is on ozempic but that is for treatment of her fatty liver  Pt with history of anxiety with depression - currently on ativan 1 mg prn which is working well for her  Pt with history of seasonal asthma - uses ventolin and symbicort as needed  Current Outpatient Medications on File Prior to Visit  Medication Sig Dispense Refill   albuterol (VENTOLIN HFA) 108 (90 Base) MCG/ACT inhaler Inhale 2 puffs into the lungs every 6 (six) hours as needed for wheezing or shortness of breath.     budesonide-formoterol (SYMBICORT) 80-4.5 MCG/ACT inhaler Inhale 2 puffs into the lungs 2 (two) times daily as needed (shortness of breath).     docusate sodium (COLACE) 100 MG capsule Take 1 capsule (100 mg total) by mouth 2 (two) times daily.      gabapentin (NEURONTIN) 100 MG capsule Take 100 mg by mouth daily as needed (pain).     LORazepam (ATIVAN) 1 MG tablet Take 1 tablet (1 mg total) by mouth daily as needed. 30 tablet 1   omeprazole (PRILOSEC) 20 MG capsule Take twice daily while taking prednisone (Patient taking differently: Take 20 mg by mouth 2 (two) times daily.) 30 capsule 0   ondansetron (ZOFRAN-ODT) 4 MG disintegrating tablet Take 1 tablet (4 mg total) by mouth every 8 (eight) hours as needed for nausea or vomiting. 90 tablet 1   Semaglutide, 1 MG/DOSE, (OZEMPIC, 1 MG/DOSE,) 4 MG/3ML SOPN Inject 1 mg into the skin once a week. 9 mL 1   Vitamin D, Ergocalciferol, (DRISDOL) 1.25 MG (50000 UNIT) CAPS capsule Take 50,000 Units by mouth 2 (two) times a week.     No current facility-administered medications on file prior to visit.   Past Medical History:  Diagnosis Date   Anxiety    Benign essential HTN    Chronic kidney disease    Depression    Diabetes mellitus without complication ([place])    TYPE 2 LAST DOSE FRIDAY OCTOBER 27 (METFORMIN)   History of herpes simplex infection    Hyperlipidemia    Vaginal delivery 2009, 2010, 2015   Past Surgical History:  Procedure Laterality Date   CESAREAN SECTION  09/29/2016  CHOLECYSTECTOMY     DILATION AND CURETTAGE OF UTERUS N/A 12/31/2015   Procedure: SUCTION DILATATION AND CURETTAGE;  Surgeon: Aletha Halim, MD;  Location: [facility];  Service: Gynecology;  Laterality: N/A;   DILATION AND EVACUATION N/A 01/01/2016   Procedure: DILATATION AND EVACUATION;  Surgeon: Jonnie Kind, MD;  Location: [facility];  Service: Gynecology;  Laterality: N/A;   ESOPHAGOGASTRODUODENOSCOPY     GASTRIC BYPASS  06/02/2020   ROBOTIC ASSITED PARTIAL NEPHRECTOMY Right 03/15/2021   Procedure: XI ROBOTIC ASSITED PARTIAL NEPHRECTOMY;  Surgeon: Raynelle Bring, MD;  Location: [facility];  Service: Urology;  Laterality: Right;   TONSILLECTOMY      Family History  Problem Relation Age of Onset   Hypertension  Mother    Cancer Maternal Grandmother        stomach cancer   Depression Maternal Grandfather    Cancer Maternal Grandfather        leukemia   Depression Paternal Grandmother    Social History   Socioeconomic History   Marital status: Married    Spouse name: Not on file   Number of children: 4   Years of education: Not on file   Highest education level: Not on file  Occupational History   Occupation: Physiological scientist  Tobacco Use   Smoking status: Never   Smokeless tobacco: Never  Vaping Use   Vaping Use: Never used  Substance and Sexual Activity   Alcohol use: Yes    Comment: OCCASIONAL   Drug use: No   Sexual activity: Yes    Birth control/protection: None    Comment: Nexplanon removed early 2017  Other Topics Concern   Not on file  Social History Narrative   Not on file   Social Determinants of Health   Financial Resource Strain: Not on file  Food Insecurity: Not on file  Transportation Needs: Not on file  Physical Activity: Not on file  Stress: Not on file  Social Connections: Not on file    CONSTITUTIONAL: see HPI E/N/T: Negative for ear pain, nasal congestion and sore throat.  CARDIOVASCULAR: Negative for chest pain,  palpitations and pedal edema.  RESPIRATORY: Negative for recent cough and dyspnea.  GASTROINTESTINAL: see HPI MSK: Negative for arthralgias and myalgias.  INTEGUMENTARY: Negative for rash.       Objective:  PHYSICAL EXAM:   VS: BP 128/82 (BP Location: Left Arm, Patient Position: Sitting)    Pulse (!) 107    Temp 98.8 F (37.1 C) (Oral)    Ht 5\' 2"  (1.575 m)    Wt 188 lb (85.3 kg)    SpO2 98%    BMI 34.39 kg/m   GEN: Well nourished, well developed, in no acute distress  Cardiac: RRR; no murmurs,  Respiratory:  normal respiratory rate and pattern with no distress - normal breath sounds with no rales, rhonchi, wheezes or rubs GI: normal bowel sounds, no masses or tenderness Skin: warm and dry, no rash  Lab Results   Component Value Date   WBC 10.6 (H) 03/05/2021   HGB 10.8 (L) 03/16/2021   HCT 33.9 (L) 03/16/2021   PLT 321 03/05/2021   GLUCOSE 90 03/17/2021   CHOL 180 09/16/2020   TRIG 86 09/16/2020   HDL 47 09/16/2020   LDLCALC 117 (H) 09/16/2020   ALT 23 09/16/2020   AST 24 09/16/2020   NA 133 (L) 03/17/2021   K 3.5 03/17/2021   CL 105 03/17/2021   CREATININE 0.66 03/17/2021   BUN 12 03/17/2021  CO2 23 03/17/2021   TSH 0.79 12/28/2015   HGBA1C 5.0 03/05/2021      Assessment & Plan:  Neoplasm right kidney - continue follow up with specialist Hepatic steatosis - continue ozempic Liver mass - will schedule repeat abdominal MRI w/wo contrast Hyperlipidemia - labwork pending - continue to watch diet Vit D def - continue current supplements Anxiety - continue ativan as needed  No orders of the defined types were placed in this encounter.   Orders Placed This Encounter  Procedures   MR Abdomen W Wo Contrast   CBC with Differential/Platelet   Comprehensive metabolic panel   TSH   Lipid panel   Magnesium   VITAMIN D 25 Hydroxy (Vit-D Deficiency, Fractures)   B12 and Folate Panel     Follow-up: Return in about 3 months (around 07/07/2021) for chronic fasting follow up.  An After Visit Summary was printed and given to the patient.  Yetta Flock [facility] [phone]

## 2021-04-10 LAB — COMPREHENSIVE METABOLIC PANEL
ALT: 24 IU/L (ref 0–32)
AST: 22 IU/L (ref 0–40)
Albumin/Globulin Ratio: 1.9 (ref 1.2–2.2)
Albumin: 4.4 g/dL (ref 3.8–4.8)
Alkaline Phosphatase: 92 IU/L (ref 44–121)
BUN/Creatinine Ratio: 16 (ref 9–23)
BUN: 12 mg/dL (ref 6–20)
Bilirubin Total: 0.6 mg/dL (ref 0.0–1.2)
CO2: 23 mmol/L (ref 20–29)
Calcium: 9.6 mg/dL (ref 8.7–10.2)
Chloride: 102 mmol/L (ref 96–106)
Creatinine, Ser: 0.74 mg/dL (ref 0.57–1.00)
Globulin, Total: 2.3 g/dL (ref 1.5–4.5)
Glucose: 78 mg/dL (ref 70–99)
Potassium: 4.2 mmol/L (ref 3.5–5.2)
Sodium: 139 mmol/L (ref 134–144)
Total Protein: 6.7 g/dL (ref 6.0–8.5)
eGFR: 110 mL/min/{1.73_m2} (ref >59)

## 2021-04-10 LAB — LIPID PANEL
Chol/HDL Ratio: 3.1 ratio (ref 0.0–4.4)
Cholesterol, Total: 195 mg/dL (ref 100–199)
HDL: 63 mg/dL (ref >39)
LDL Chol Calc (NIH): 114 mg/dL — ABNORMAL HIGH (ref 0–99)
Triglycerides: 103 mg/dL (ref 0–149)
VLDL Cholesterol Cal: 18 mg/dL (ref 5–40)

## 2021-04-10 LAB — CBC WITH DIFFERENTIAL/PLATELET
Basophils Absolute: 0.1 10*3/uL (ref 0.0–0.2)
Basos: 1 %
EOS (ABSOLUTE): 0.5 10*3/uL — ABNORMAL HIGH (ref 0.0–0.4)
Eos: 5 %
Hematocrit: 36.5 % (ref 34.0–46.6)
Hemoglobin: 11.9 g/dL (ref 11.1–15.9)
Immature Grans (Abs): 0 10*3/uL (ref 0.0–0.1)
Immature Granulocytes: 0 %
Lymphocytes Absolute: 2.8 10*3/uL (ref 0.7–3.1)
Lymphs: 26 %
MCH: 26.6 pg (ref 26.6–33.0)
MCHC: 32.6 g/dL (ref 31.5–35.7)
MCV: 82 fL (ref 79–97)
Monocytes Absolute: 0.8 10*3/uL (ref 0.1–0.9)
Monocytes: 8 %
Neutrophils Absolute: 6.5 10*3/uL (ref 1.4–7.0)
Neutrophils: 60 %
Platelets: 434 10*3/uL (ref 150–450)
RBC: 4.48 x10E6/uL (ref 3.77–5.28)
RDW: 14.5 % (ref 11.7–15.4)
WBC: 10.8 10*3/uL (ref 3.4–10.8)

## 2021-04-10 LAB — B12 AND FOLATE PANEL
Folate: 5.6 ng/mL (ref >3.0)
Vitamin B-12: 623 pg/mL (ref 232–1245)

## 2021-04-10 LAB — CARDIOVASCULAR RISK ASSESSMENT

## 2021-04-10 LAB — MAGNESIUM: Magnesium: 1.9 mg/dL (ref 1.6–2.3)

## 2021-04-10 LAB — VITAMIN D 25 HYDROXY (VIT D DEFICIENCY, FRACTURES): Vit D, 25-Hydroxy: 7.8 ng/mL — ABNORMAL LOW (ref 30.0–100.0)

## 2021-04-10 LAB — TSH: TSH: 0.697 u[IU]/mL (ref 0.450–4.500)

## 2021-04-12 ENCOUNTER — Other Ambulatory Visit: Payer: Self-pay | Admitting: Physician Assistant

## 2021-04-12 DIAGNOSIS — E559 Vitamin D deficiency, unspecified: Secondary | ICD-10-CM

## 2021-04-12 MED ORDER — CHOLECALCIFEROL 1.25 MG (50000 UT) PO CAPS: ORAL_CAPSULE | ORAL | 3 refills | Status: DC

## 2021-04-15 ENCOUNTER — Encounter: Payer: No Typology Code available for payment source | Admitting: Genetic Counselor

## 2021-04-23 ENCOUNTER — Ambulatory Visit (INDEPENDENT_AMBULATORY_CARE_PROVIDER_SITE_OTHER): Payer: No Typology Code available for payment source

## 2021-04-23 ENCOUNTER — Other Ambulatory Visit: Payer: Self-pay | Admitting: Physician Assistant

## 2021-04-23 DIAGNOSIS — R5381 Other malaise: Secondary | ICD-10-CM

## 2021-04-23 LAB — POCT URINALYSIS DIP (CLINITEK)
Blood, UA: NEGATIVE
Glucose, UA: NEGATIVE mg/dL
Ketones, POC UA: NEGATIVE mg/dL
Nitrite, UA: POSITIVE — AB
POC PROTEIN,UA: 30 — AB
Spec Grav, UA: >=1.03 — AB (ref 1.010–1.025)
Urobilinogen, UA: 1 E.U./dL
pH, UA: 6 (ref 5.0–8.0)

## 2021-04-23 MED ORDER — NITROFURANTOIN MONOHYD MACRO 100 MG PO CAPS: 100.0000 mg | ORAL_CAPSULE | Freq: Two times a day (BID) | ORAL | 0 refills | Status: DC

## 2021-04-25 ENCOUNTER — Ambulatory Visit
Admission: RE | Admit: 2021-04-25 | Discharge: 2021-04-25 | Disposition: A | Discharge status: Home / Self Care | Payer: No Typology Code available for payment source | Source: Ambulatory Visit | Attending: Physician Assistant | Admitting: Physician Assistant

## 2021-04-25 ENCOUNTER — Other Ambulatory Visit: Payer: Self-pay

## 2021-04-25 DIAGNOSIS — K769 Liver disease, unspecified: Secondary | ICD-10-CM

## 2021-04-25 DIAGNOSIS — R160 Hepatomegaly, not elsewhere classified: Secondary | ICD-10-CM

## 2021-04-25 IMAGING — MR MR ABDOMEN WO/W CM
11 of 17 series · 25 of 48 positions shown · IV contrast (17ml Multihance)
Comparison: CT AP [DATE] and MR abdomen [DATE]

CLINICAL DATA: Evaluate indeterminate liver lesion identified on
previous exam.

EXAM:
MRI ABDOMEN WITHOUT AND WITH CONTRAST
TECHNIQUE: Multiplanar multisequence MR imaging of the abdomen was performed
both before and after the administration of intravenous contrast.
CONTRAST:  15mL MULTIHANCE GADOBENATE DIMEGLUMINE 529 MG/ML IV SOLN

[Series 3: T2 · coronal · 5.0mm · 1.56mm/px · 1 of 30 slices shown (1 of 3)]
[im 1/30]
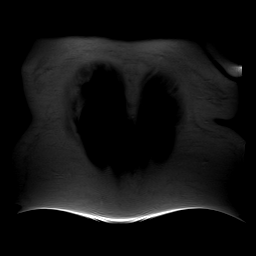

[Series 4: axial tru fisp · axial · 5.0mm · 1.41mm/px · 1 of 40 slices shown]
[im 1/40]
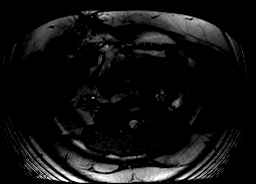

[Series 5: T2 · axial · 6.5mm · 0.74mm/px · 1 of 34 slices shown (2 of 3)]
[im 1/34]
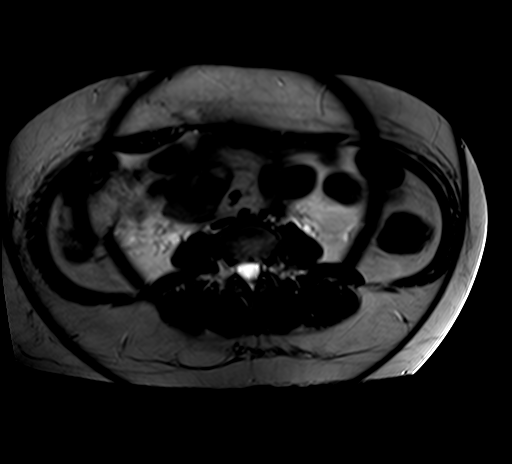

[Series 6: [id number] · axial · 6.0mm · 1.98mm/px · z∈[-32,+225]mm · 3 of 102 slices shown]
[im 1/102]
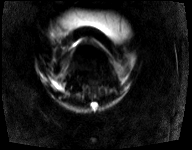
[im 51/102]
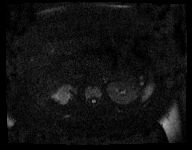
[im 102/102]
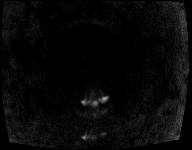

[Series 7: [id number]_adc · axial · 6.0mm · 1.98mm/px · 1 of 34 slices shown]
[im 1/34]
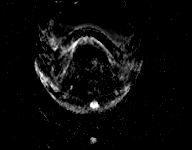

[Series 8: T2 · axial · 5.0mm · 1.37mm/px · 1 of 35 slices shown (3 of 3)]
[im 1/35]
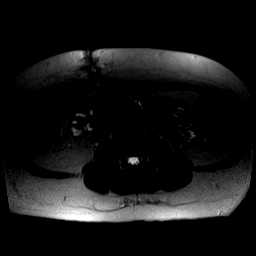

[Series 9: axial in out · axial · 6.0mm · 0.74mm/px · 1 of 64 slices shown]
[im 1/64]
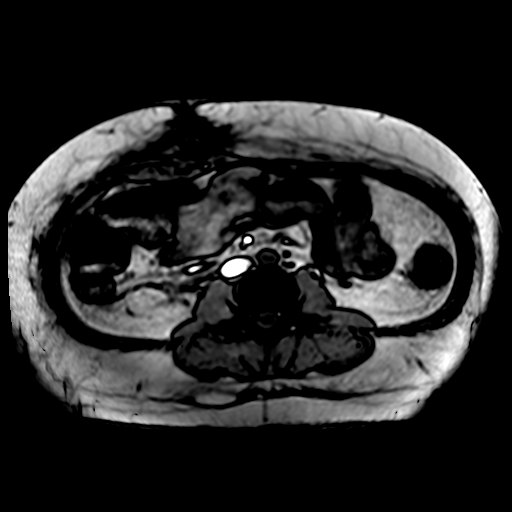

[Series 10: T1 dynamic · axial · non-contrast · 2.2mm · 0.78mm/px · z∈[+2,+210]mm · 4 of 96 slices shown]
[im 1/96]
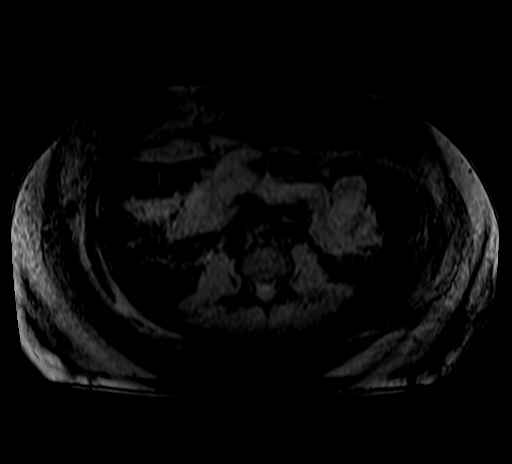
[im 32/96]
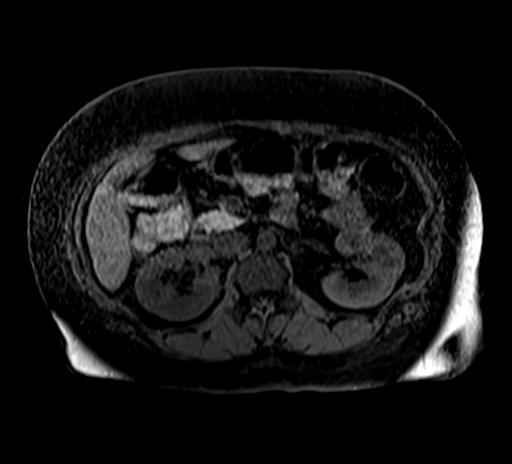
[im 64/96]
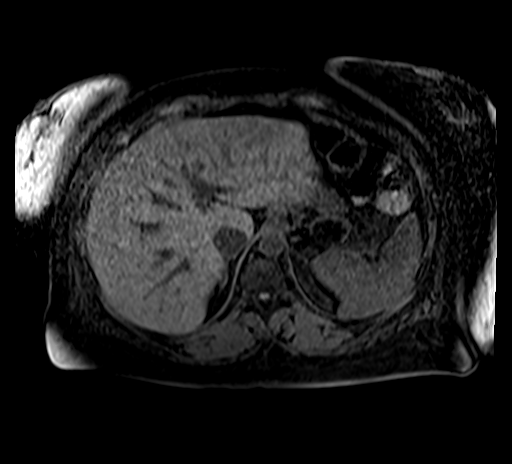
[im 96/96]
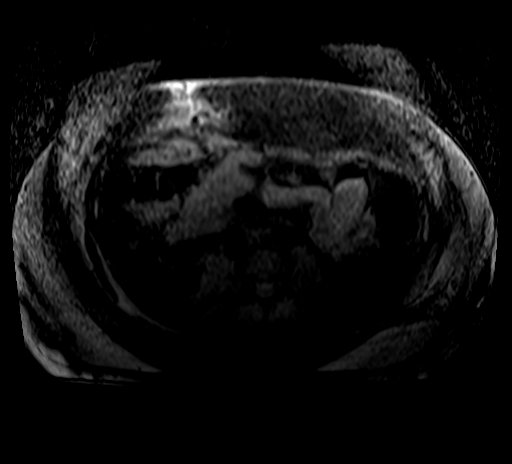

[Series 11: post 25 sec · axial · 2.2mm · 0.78mm/px · z∈[+2,+210]mm · 4 of 96 slices shown]
[im 1/96]
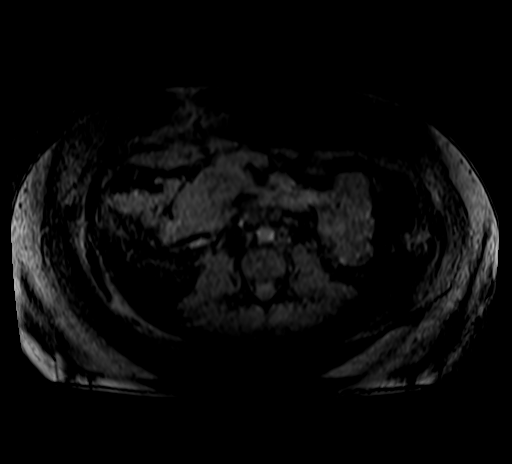
[im 32/96]
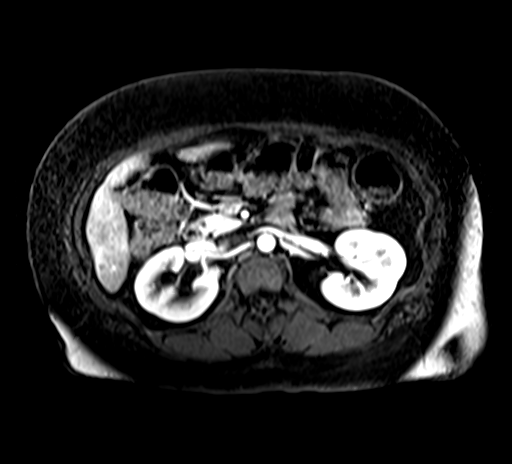
[im 64/96]
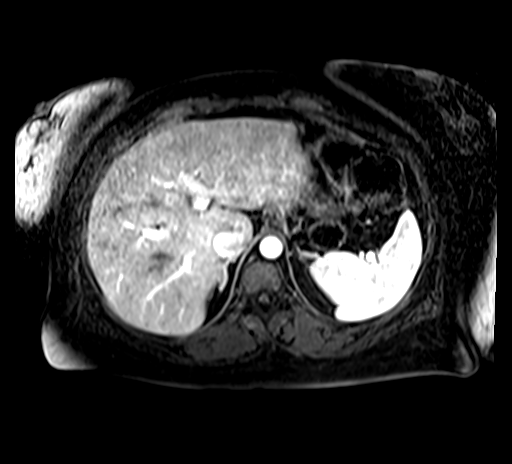
[im 96/96]
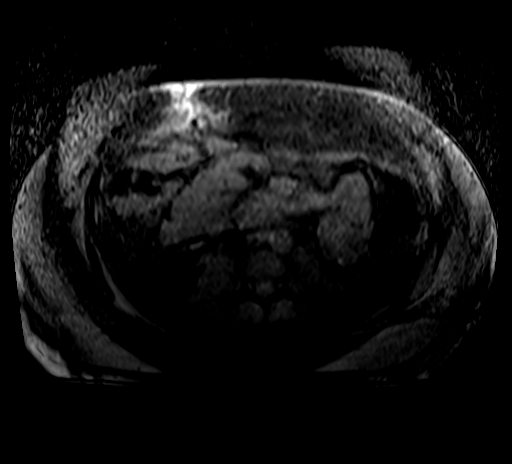

[Series 12: post 25 sec_sub · axial · 2.2mm · 0.78mm/px · z∈[+2,+210]mm · 4 of 96 slices shown]
[im 1/96]
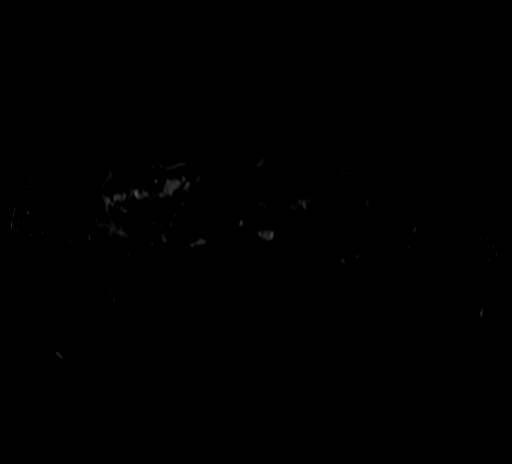
[im 32/96]
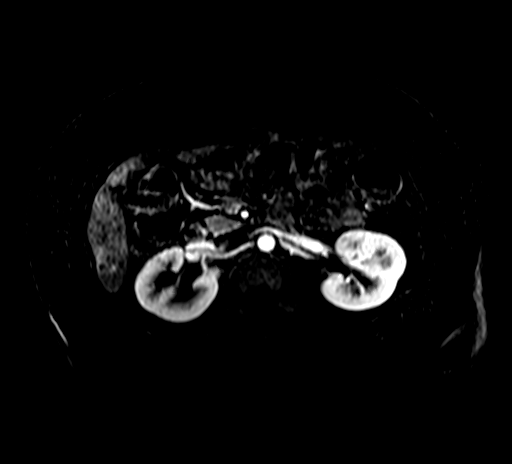
[im 64/96]
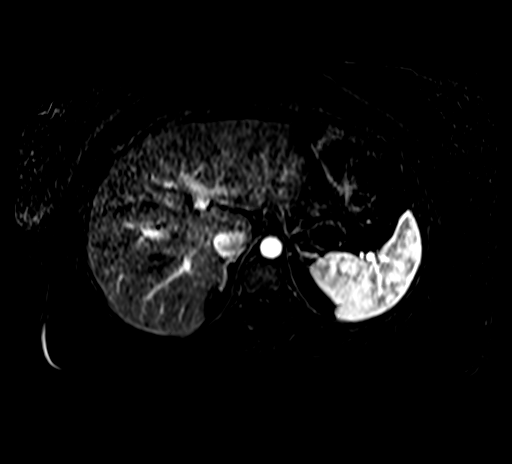
[im 96/96]
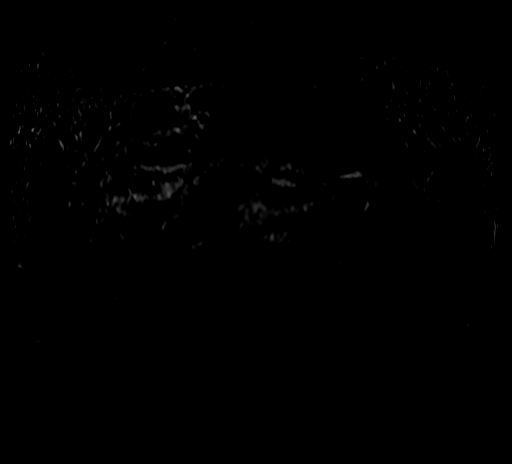

[Series 13: post 45 sec · axial · 2.2mm · 0.78mm/px · z∈[+2,+210]mm · 4 of 96 slices shown]
[im 1/96]
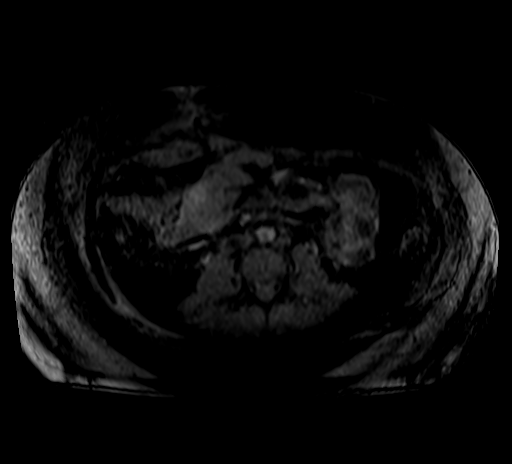
[im 32/96]
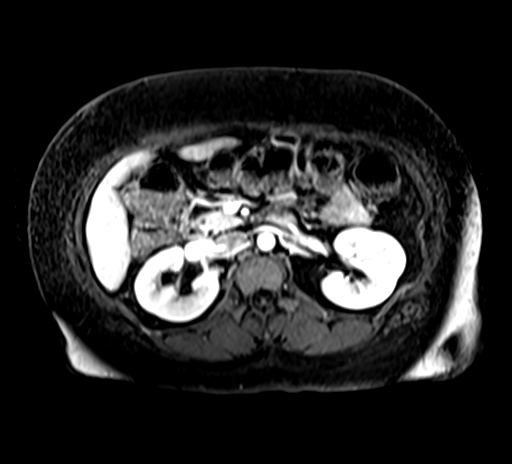
[im 64/96]
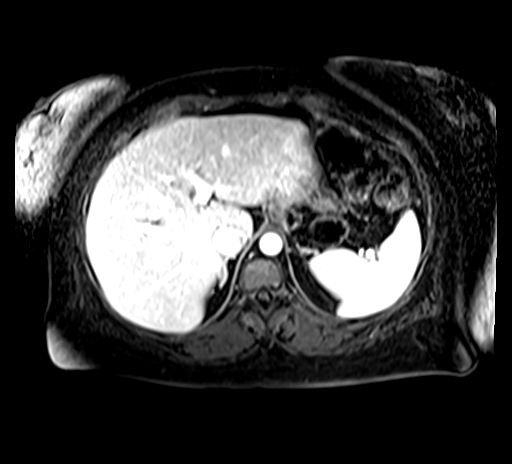
[im 96/96]
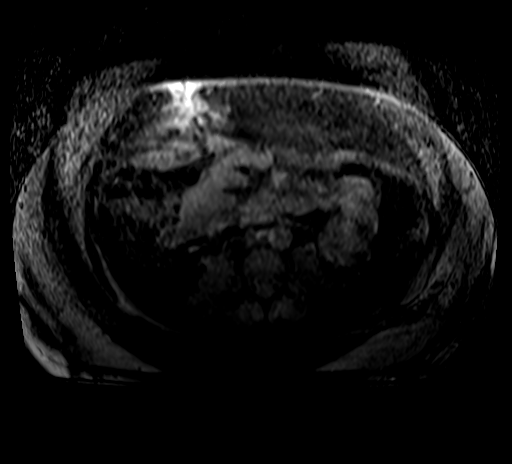

[25 of 48 positions shown; findings below may reference images not displayed]

FINDINGS: Lower chest: No acute findings.

Hepatobiliary:

Persistent but improved appearance of diffuse hepatic steatosis.
Previously noted scattered T1 hyperintense nodules noted on the out
of phase sequence images significantly diminished in multiplicity
compared with previous exam. A few tiny areas of focal nodular fatty
sparing identified within the right lobe, image [REDACTED]and image
[REDACTED]

The previously characterized dominant lesion in segment 4A/4B the
measures 2.2 x 2.2 by 1.7 cm, image 38/15. Previously this measured
3.3 x 2.9 by 2.7 cm.

The previously characterized 0.9 x 0.8 cm segment 3 arterial phase
enhancing lesion is no longer visualized.

No new enhancing liver lesions identified at this time.

Status post cholecystectomy. No bile duct dilatation.

Pancreas: No mass, inflammatory changes, or other parenchymal
abnormality identified.

Spleen:  Within normal limits in size and appearance.

Adrenals/Urinary Tract:  Normal adrenal glands.

Status post partial nephrectomy from the upper pole of the right
kidney. There is new right-sided hydronephrosis up to the level of
the UPJ. Etiology indeterminate. Unremarkable appearance of the left
kidney.

Stomach/Bowel: Postoperative changes from gastric bypass surgery. No
dilated bowel loops identified.

Vascular/Lymphatic: No pathologically enlarged lymph nodes
identified. No abdominal aortic aneurysm demonstrated.

Other:  There is no free fluid or fluid collections.

Musculoskeletal: No suspicious bone lesions identified.
IMPRESSION: 1. Interval improvement in previously noted severe diffuse hepatic
steatosis.
2. The previously characterized lesion in segment 4A/4B is decreased
in size compared with previous exam. The signal and enhancement
characteristics are favored to represent benign process, likely
benign adenoma versus severe focal fatty deposition.
3. Additional small scattered nodular densities are also improved in
the interval.
4. Status post partial nephrectomy from the upper pole of the right
kidney.
5. New right-sided hydronephrosis up to the level of the UPJ.
Etiology indeterminate. Urologic consultation is recommended.
6. Insert AMOS report

## 2021-04-25 MED ORDER — GADOBENATE DIMEGLUMINE 529 MG/ML IV SOLN
15.0000 mL | Freq: Once | INTRAVENOUS | Status: AC | PRN
  Administered 2021-04-25: 15 mL via INTRAVENOUS

## 2021-04-28 LAB — URINE CULTURE

## 2021-04-30 ENCOUNTER — Other Ambulatory Visit: Payer: Self-pay | Admitting: Urology

## 2021-04-30 ENCOUNTER — Other Ambulatory Visit: Payer: Self-pay

## 2021-04-30 ENCOUNTER — Encounter (HOSPITAL_COMMUNITY): Payer: Self-pay | Admitting: Urology

## 2021-04-30 NOTE — H&P (Signed)
Office Visit Report     04/30/2021   --------------------------------------------------------------------------------   Michelle Lamb  MRN: [id number]  DOB: 26-Jun-1990, 31 year old Female  SSN:    PRIMARY CARE:  Everlean Alstrom. [place], [place]  REFERRING:  Ammie Dalton, NP  PROVIDER:  Raynelle Bring, M.D.  LOCATION:  [facility] [phone]     --------------------------------------------------------------------------------   CC/HPI: 1. Renal angiomyolipoma of the right kidney  2. Right hydronephrosis   [name] returns today after she underwent an MRI of the abdomen earlier this week for routine follow-up of her known hepatic lesion. This demonstrated incidentally detected mild right hydronephrosis. I was notified that she had had her MRI and reviewed this independently and this did raise possible concern for right ureteral obstruction. As such, I recommended that she follow-up with a CT scan with delayed imaging of the kidneys and ureters considering her recent partial nephrectomy. She continues to have mild right-sided flank pain which has been persistent since surgery. She denies any hematuria. She is currently on Bactrim for E. coli bacteriuria/UTI. She denies any fevers.     ALLERGIES: No Known Drug Allergies    MEDICATIONS: Omeprazole 20 mg capsule,delayed release  Cyclobenzaprine Hcl 10 mg tablet  Lorazepam 1 mg tablet  Ondansetron Hcl 4 mg tablet  Ozempic 0.25 mg or 0.5 mg dose (2 mg/1.5 ml) pen injector  Vitamin D2     GU PSH: Locm 300-399Mg /Ml Iodine,1Ml - 04/29/2021 Partial nephrectomy (laparoscopic) - 03/15/2021     NON-GU PSH: Gastric bypass - 12/29/2020 Remove Gallbladder - 2011     GU PMH: Benign tumor right kidney - 04/29/2021, - 01/12/2021 Renal calculus - 01/12/2021      PMH Notes:   1) Right angiomyolipoma: She is s/p a right RAL partial nephrectomy on 03/15/21. 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   Baseline renal function: normal   NON-GU PMH:  Benign lipomatous neoplasm of kidney - 04/13/2021, - 03/30/2021, - 03/05/2021, - 01/26/2021 Bacteriuria - 03/05/2021, - 01/26/2021 Anxiety Asthma Diabetes Type 2 GERD    FAMILY HISTORY: 1 son - Runs in Family 3 daughters - Runs in Family Primary cancer of lesser curve of stomach - Runs in Family   SOCIAL HISTORY: Marital Status: Married Ethnicity: Not Hispanic Or Latino; Race: Black or African American Current Smoking Status: Patient has never smoked.   Tobacco Use Assessment Completed: Used Tobacco in last 30 days? Has never drank.  Drinks 2 caffeinated drinks per day.    REVIEW OF SYSTEMS:    GU Review Female:   Patient denies frequent urination, hard to postpone urination, burning /pain with urination, get up at night to urinate, leakage of urine, stream starts and stops, trouble starting your stream, have to strain to urinate, and currently pregnant.  Gastrointestinal (Upper):   Patient denies nausea and vomiting.  Gastrointestinal (Lower):   Patient denies diarrhea and constipation.  Constitutional:   Patient denies fever, night sweats, weight loss, and fatigue.  Skin:   Patient denies skin rash/ lesion and itching.  Eyes:   Patient denies blurred vision and double vision.  Ears/ Nose/ Throat:   Patient denies sore throat and sinus problems.  Hematologic/Lymphatic:   Patient denies swollen glands and easy bruising.  Cardiovascular:   Patient denies leg swelling and chest pains.  Respiratory:   Patient denies cough and shortness of breath.  Endocrine:   Patient denies excessive thirst.  Musculoskeletal:   Patient denies back pain and joint pain.  Neurological:  Patient denies headaches and dizziness.  Psychologic:   Patient denies depression and anxiety.   VITAL SIGNS:      04/30/2021 01:05 PM  Weight 187 lb / 84.82 kg  Height 63 in / 160.02 cm  BP 128/86 mmHg  Pulse 79 /min  Temperature 97.5 F / 36.3 C  BMI 33.1 kg/m   MULTI-SYSTEM PHYSICAL EXAMINATION:     Constitutional: Well-nourished. No physical deformities. Normally developed. Good grooming.  Respiratory: No labored breathing, no use of accessory muscles.   Cardiovascular: Normal temperature, normal extremity pulses, no swelling, no varicosities.  Gastrointestinal: Mild right CVA tenderness, moderate right upper quadrant abdominal tenderness.     Complexity of Data:  Records Review:   Previous Patient Records  X-Ray Review: C.T. Abdomen/Pelvis: Reviewed Films.    Notes:                     CT ABDOMEN AND PELVIS WITH CONTRAST 04/29/2021   --------------------------------------------------------------------------------   Michelle Lamb  MRN: [id number] Ordering Provider: Raynelle Bring, JR  DOB: 1990-07-28 Date Collected: 04/29/2021 08:15:00  SSN: -**- Date Completed: 04/29/2021 16:36:06   --------------------------------------------------------------------------------   CLINICAL DATA: History of right partial nephrectomy 03/15/2021 for  right renal angiomyolipoma. Right flank pain with right-sided  hydronephrosis on recent MRI.   EXAM:  CT ABDOMEN AND PELVIS WITH CONTRAST   TECHNIQUE:  Multidetector CT imaging of the abdomen and pelvis was performed  using the standard protocol following bolus administration of  intravenous contrast.   RADIATION DOSE REDUCTION: This exam was performed according to the  departmental dose-optimization program which includes automated  exposure control, adjustment of the mA and/or kV according to  patient size and/or use of iterative reconstruction technique.   CONTRAST: 125 cc Omnipaque 300   COMPARISON: Abdominal MRI 04/25/2021 and 11/29/2020. Abdominopelvic  CT 01/04/2021.   FINDINGS:  Lower chest: Clear lung bases. No significant pleural or pericardial  effusion.   Hepatobiliary: Previously demonstrated hepatic steatosis has  improved. Heterogeneously enhancing lesion in segments 4A and 4B  measures 2.6 x 2.2 cm on image 18/2, similar  to prior studies. No  new liver lesions are identified. No evidence of significant biliary  dilatation status post cholecystectomy.   Pancreas: Unremarkable. No pancreatic ductal dilatation or  surrounding inflammatory changes.   Spleen: Normal in size without focal abnormality.   Adrenals/Urinary Tract: Both adrenal glands appear normal. There are  stable postsurgical changes in the upper pole right kidney related  to partial nephrectomy for the previously demonstrated  angiomyolipoma. There is a small amount of residual soft tissue  stranding in this area, but no focal fluid collection. The right  kidney demonstrates decreased cortical enhancement relative to the  left kidney on the early postcontrast images. As seen on recent MRI,  there is right-sided hydronephrosis and proximal hydroureter with  delayed contrast excretion. The distal right ureter appears normal  in caliber, but is not opacified with contrast on the delayed  post-contrast images. No well-defined ureteral calculus identified.  On the reformatted images, there is linear high density traversing  the right UPJ (best seen on coronal series 604 and sagittal series  605) which is not typical for a calculus or blood clot and could  reflect ureteral ligation. No surrounding soft tissue mass, urinoma  or other fluid collection is seen. The left kidney and ureter appear  normal. The bladder appears unremarkable.   Stomach/Bowel: No enteric contrast administered. There are  postsurgical changes in the upper abdomen related  to previous  gastric bypass. No evidence of bowel distension, wall thickening or  surrounding inflammation.   Vascular/Lymphatic: There are no enlarged abdominal or pelvic lymph  nodes. No significant vascular findings. The renal arteries and  veins appear patent. The portal, superior mesenteric and splenic  veins are patent.   Reproductive: The uterus and ovaries appear normal. No adnexal mass.    Other: Presumed postsurgical changes in the subcutaneous fat of the  anterior abdominal wall above the umbilicus without focal fluid  collection. No ascites or free air.   Musculoskeletal: No acute or significant osseous findings.   IMPRESSION:  1. Follow-up CT confirms the presence of high-grade proximal right  ureteral obstruction, etiology uncertain. Proximal partial ureteral  ligation cannot be excluded. Consider retrograde evaluation of the  ureter for potential stent placement.  2. No focal fluid collection status post partial right nephrectomy.  3. The left kidney appears unremarkable.  4. Stable enhancing lesion in the left hepatic lobe, better  characterized by previous MRI and potentially an adenoma. This has  slightly decreased in size from the baseline MRI. Continued  follow-up of this lesion recommended.  5. I attempted to reach Dr. Alinda Money to discuss these findings on  04/29/2021, but he was not available. These results will be called  to the ordering clinician or representative by the Radiologist  Assistant, and communication documented in the PACS or [employer].    Electronically Signed  By: Richardean Sale M.D.  On: 04/29/2021 16:33     PROCEDURES:          Urinalysis w/Scope Dipstick Dipstick Cont'd Micro  Color: Amber Bilirubin: Neg mg/dL WBC/hpf: 0 - 5/hpf  Appearance: Cloudy Ketones: Trace mg/dL RBC/hpf: 3 - 10/hpf  Specific Gravity: 1.030 Blood: 2+ ery/uL Bacteria: Few (10-25/hpf)  pH: 5.5 Protein: Trace mg/dL Cystals: Amorph Urates  Glucose: Neg mg/dL Urobilinogen: 1.0 mg/dL Casts: NS (Not Seen)    Nitrites: Neg Trichomonas: Not Present    Leukocyte Esterase: Neg leu/uL Mucous: Not Present      Epithelial Cells: 6 - 10/hpf      Yeast: NS (Not Seen)      Sperm: Not Present    ASSESSMENT:      ICD-10 Details  1 GU:   Hydronephrosis - N13.0    PLAN:           Schedule Return Visit/Planned Activity: Other See Visit Notes              Note: Surgery to be scheduled.          Document Letter(s):  Created for Patient: Clinical Summary         Notes:   1. Right hydronephrosis: I reviewed her CT scan results with her today. I do have concern that there could potentially be ureteral obstruction related to her recent partial nephrectomy. I have recommended that she proceed with cystoscopy, right retrograde pyelography, possible right ureteroscopy and possible right ureteral stent placement for further diagnostic evaluation. We reviewed the potential risks and complications associated with these procedures. She gives informed consent. Further recommendations will be pending this result.   CC: Michelle Duncans, PA-C        Next Appointment:      Next Appointment: 05/03/2021 04:45 PM    Appointment Type: Surgery     Location: [facility] [phone]    Provider: Raynelle Bring, M.D.    Reason for Visit: OP WL CYSTO RT RGP URS STENT      *  Signed by Raynelle Bring, M.D. on 04/30/21 at 5:06 PM (EST)*

## 2021-04-30 NOTE — Progress Notes (Signed)
For Short Stay: ?[place] appointment date: N/A ?Date of COVID positive in last 25 days:N/A ? ?Bowel Prep reminder: N/A ? ? ?For Anesthesia: ?PCP - [name], PA-C last office visit note 04/09/21 in epic ?Cardiologist - N/A ? ?Chest x-ray - N/A ?EKG - 09/30/20 in epic ?Stress Test - N/A ?ECHO - N/A ?Cardiac Cath - N/A ?Pacemaker/ICD device last checked: N/A ?Pacemaker orders received: N/A ?Device Rep notified: N/A ? ?Spinal Cord Stimulator: N/A ? ?Sleep Study - Yes ?CPAP - No CPAP since gastric surgery ? ?Fasting Blood Sugar - N/A ?Checks Blood Sugar ___N/A__ times a day ?Date and result of last Hgb A1c- 03/05/21 5.0 in epic ? ?Blood Thinner Instructions: N/A ?Aspirin Instructions: N/A ?Last Dose: N/A ? ?Activity level:  Able to exercise without chest pain and/or shortness of breath ?   ?   ? ?Anesthesia review: HTN, DM ? ?Patient denies shortness of breath, fever, cough and chest pain at PAT appointment ? ? ?Patient verbalized understanding of instructions that were given to them at the PAT appointment. Patient was also instructed that they will need to review over the PAT instructions again at home before surgery.  ?

## 2021-05-03 ENCOUNTER — Ambulatory Visit (HOSPITAL_COMMUNITY): Payer: No Typology Code available for payment source | Admitting: Anesthesiology

## 2021-05-03 ENCOUNTER — Encounter (HOSPITAL_COMMUNITY)
Admission: RE | Disposition: A | Discharge status: Home / Self Care | Payer: Self-pay | Source: Home / Self Care | Attending: Urology

## 2021-05-03 ENCOUNTER — Ambulatory Visit (HOSPITAL_BASED_OUTPATIENT_CLINIC_OR_DEPARTMENT_OTHER): Payer: No Typology Code available for payment source | Admitting: Anesthesiology

## 2021-05-03 ENCOUNTER — Encounter (HOSPITAL_COMMUNITY): Payer: Self-pay | Admitting: Urology

## 2021-05-03 ENCOUNTER — Ambulatory Visit (HOSPITAL_COMMUNITY): Payer: No Typology Code available for payment source

## 2021-05-03 ENCOUNTER — Ambulatory Visit (HOSPITAL_COMMUNITY)
Admission: RE | Admit: 2021-05-03 | Discharge: 2021-05-03 | Disposition: A | Discharge status: Home / Self Care | Payer: No Typology Code available for payment source | Attending: Urology | Admitting: Urology

## 2021-05-03 DIAGNOSIS — Z905 Acquired absence of kidney: Secondary | ICD-10-CM | POA: Diagnosis not present

## 2021-05-03 DIAGNOSIS — F419 Anxiety disorder, unspecified: Secondary | ICD-10-CM | POA: Diagnosis not present

## 2021-05-03 DIAGNOSIS — N201 Calculus of ureter: Secondary | ICD-10-CM | POA: Diagnosis not present

## 2021-05-03 DIAGNOSIS — I10 Essential (primary) hypertension: Secondary | ICD-10-CM | POA: Insufficient documentation

## 2021-05-03 DIAGNOSIS — N131 Hydronephrosis with ureteral stricture, not elsewhere classified: Secondary | ICD-10-CM | POA: Diagnosis present

## 2021-05-03 DIAGNOSIS — Z01818 Encounter for other preprocedural examination: Secondary | ICD-10-CM

## 2021-05-03 HISTORY — DX: Essential (primary) hypertension: I10

## 2021-05-03 HISTORY — DX: Other complications of anesthesia, initial encounter: T88.59XA

## 2021-05-03 HISTORY — DX: Unspecified asthma, uncomplicated: J45.909

## 2021-05-03 HISTORY — DX: Gastro-esophageal reflux disease without esophagitis: K21.9

## 2021-05-03 HISTORY — DX: Fatty (change of) liver, not elsewhere classified: K76.0

## 2021-05-03 HISTORY — PX: CYSTOSCOPY WITH RETROGRADE PYELOGRAM, URETEROSCOPY AND STENT PLACEMENT: SHX5789

## 2021-05-03 HISTORY — DX: Other seasonal allergic rhinitis: J30.2

## 2021-05-03 HISTORY — DX: Vitamin D deficiency, unspecified: E55.9

## 2021-05-03 HISTORY — DX: Sleep apnea, unspecified: G47.30

## 2021-05-03 LAB — BASIC METABOLIC PANEL
Anion gap: 7 (ref 5–15)
BUN: 14 mg/dL (ref 6–20)
CO2: 25 mmol/L (ref 22–32)
Calcium: 8.9 mg/dL (ref 8.9–10.3)
Chloride: 106 mmol/L (ref 98–111)
Creatinine, Ser: 0.63 mg/dL (ref 0.44–1.00)
GFR, Estimated: >60 mL/min (ref >60)
Glucose, Bld: 76 mg/dL (ref 70–99)
Potassium: 4.1 mmol/L (ref 3.5–5.1)
Sodium: 138 mmol/L (ref 135–145)

## 2021-05-03 LAB — CBC
HCT: 35.1 % — ABNORMAL LOW (ref 36.0–46.0)
Hemoglobin: 11.3 g/dL — ABNORMAL LOW (ref 12.0–15.0)
MCH: 26.9 pg (ref 26.0–34.0)
MCHC: 32.2 g/dL (ref 30.0–36.0)
MCV: 83.6 fL (ref 80.0–100.0)
Platelets: 326 10*3/uL (ref 150–400)
RBC: 4.2 MIL/uL (ref 3.87–5.11)
RDW: 14.6 % (ref 11.5–15.5)
WBC: 10.9 10*3/uL — ABNORMAL HIGH (ref 4.0–10.5)
nRBC: 0 % (ref 0.0–0.2)

## 2021-05-03 LAB — PREGNANCY, URINE: Preg Test, Ur: NEGATIVE

## 2021-05-03 LAB — HCG, SERUM, QUALITATIVE: Preg, Serum: NEGATIVE

## 2021-05-03 LAB — GLUCOSE, CAPILLARY: Glucose-Capillary: 70 mg/dL (ref 70–99)

## 2021-05-03 SURGERY — CYSTOURETEROSCOPY, WITH RETROGRADE PYELOGRAM AND STENT INSERTION
Anesthesia: General | Laterality: Right

## 2021-05-03 MED ORDER — SODIUM CHLORIDE 0.9 % IR SOLN
Status: DC | PRN
  Administered 2021-05-03: 3000 mL

## 2021-05-03 MED ORDER — ONDANSETRON HCL 4 MG/2ML IJ SOLN
INTRAMUSCULAR | Status: AC
  Filled 2021-05-03: qty 2

## 2021-05-03 MED ORDER — TRAMADOL HCL 50 MG PO TABS: 50.0000 mg | ORAL_TABLET | Freq: Four times a day (QID) | ORAL | 0 refills | Status: DC | PRN

## 2021-05-03 MED ORDER — FENTANYL CITRATE (PF) 100 MCG/2ML IJ SOLN
INTRAMUSCULAR | Status: AC
  Filled 2021-05-03: qty 2

## 2021-05-03 MED ORDER — ORAL CARE MOUTH RINSE: 15.0000 mL | Freq: Once | OROMUCOSAL | Status: AC

## 2021-05-03 MED ORDER — MIDAZOLAM HCL 5 MG/5ML IJ SOLN
INTRAMUSCULAR | Status: DC | PRN
  Administered 2021-05-03: 2 mg via INTRAVENOUS

## 2021-05-03 MED ORDER — STERILE WATER FOR IRRIGATION IR SOLN
Status: DC | PRN
  Administered 2021-05-03: 250 mL

## 2021-05-03 MED ORDER — CHLORHEXIDINE GLUCONATE 0.12 % MT SOLN
15.0000 mL | Freq: Once | OROMUCOSAL | Status: AC
  Administered 2021-05-03: 15 mL via OROMUCOSAL

## 2021-05-03 MED ORDER — ACETAMINOPHEN 500 MG PO TABS: 1000.0000 mg | ORAL_TABLET | Freq: Once | ORAL | Status: DC | PRN

## 2021-05-03 MED ORDER — PROPOFOL 10 MG/ML IV BOLUS
INTRAVENOUS | Status: DC | PRN
  Administered 2021-05-03: 160 mg via INTRAVENOUS

## 2021-05-03 MED ORDER — DEXAMETHASONE SODIUM PHOSPHATE 10 MG/ML IJ SOLN
INTRAMUSCULAR | Status: DC | PRN
  Administered 2021-05-03: 10 mg via INTRAVENOUS

## 2021-05-03 MED ORDER — LIDOCAINE HCL (PF) 2 % IJ SOLN
INTRAMUSCULAR | Status: AC
  Filled 2021-05-03: qty 5

## 2021-05-03 MED ORDER — DEXAMETHASONE SODIUM PHOSPHATE 10 MG/ML IJ SOLN
INTRAMUSCULAR | Status: AC
  Filled 2021-05-03: qty 1

## 2021-05-03 MED ORDER — MIDAZOLAM HCL 2 MG/2ML IJ SOLN
INTRAMUSCULAR | Status: AC
  Filled 2021-05-03: qty 2

## 2021-05-03 MED ORDER — ACETAMINOPHEN 10 MG/ML IV SOLN: 1000.0000 mg | Freq: Once | INTRAVENOUS | Status: DC | PRN

## 2021-05-03 MED ORDER — LIDOCAINE 2% (20 MG/ML) 5 ML SYRINGE
INTRAMUSCULAR | Status: DC | PRN
Start: 2021-05-03 — End: 2021-05-03
  Administered 2021-05-03: 80 mg via INTRAVENOUS

## 2021-05-03 MED ORDER — FENTANYL CITRATE PF 50 MCG/ML IJ SOSY: 25.0000 ug | PREFILLED_SYRINGE | INTRAMUSCULAR | Status: DC | PRN

## 2021-05-03 MED ORDER — FENTANYL CITRATE (PF) 100 MCG/2ML IJ SOLN
INTRAMUSCULAR | Status: DC | PRN
  Administered 2021-05-03: 25 ug via INTRAVENOUS
  Administered 2021-05-03: 100 ug via INTRAVENOUS
  Administered 2021-05-03: 25 ug via INTRAVENOUS

## 2021-05-03 MED ORDER — LACTATED RINGERS IV SOLN: INTRAVENOUS | Status: DC

## 2021-05-03 MED ORDER — PROPOFOL 10 MG/ML IV BOLUS
INTRAVENOUS | Status: AC
  Filled 2021-05-03: qty 20

## 2021-05-03 MED ORDER — CIPROFLOXACIN IN D5W 400 MG/200ML IV SOLN
400.0000 mg | INTRAVENOUS | Status: AC
  Administered 2021-05-03: 400 mg via INTRAVENOUS
  Filled 2021-05-03: qty 200

## 2021-05-03 MED ORDER — IOHEXOL 300 MG/ML  SOLN
INTRAMUSCULAR | Status: DC | PRN
  Administered 2021-05-03: 17 mL via ORAL

## 2021-05-03 MED ORDER — FENTANYL CITRATE PF 50 MCG/ML IJ SOSY
PREFILLED_SYRINGE | INTRAMUSCULAR | Status: AC
  Administered 2021-05-03: 25 ug via INTRAVENOUS
  Filled 2021-05-03: qty 2

## 2021-05-03 MED ORDER — ONDANSETRON HCL 4 MG/2ML IJ SOLN
INTRAMUSCULAR | Status: DC | PRN
  Administered 2021-05-03: 4 mg via INTRAVENOUS

## 2021-05-03 MED ORDER — ACETAMINOPHEN 160 MG/5ML PO SOLN: 1000.0000 mg | Freq: Once | ORAL | Status: DC | PRN

## 2021-05-03 SURGICAL SUPPLY — 22 items
BAG COUNTER SPONGE SURGICOUNT (BAG) IMPLANT
BAG SPNG CNTER NS LX DISP (BAG)
BAG URO CATCHER STRL LF (MISCELLANEOUS) ×2 IMPLANT
BASKET ZERO TIP NITINOL 2.4FR (BASKET) IMPLANT
BSKT STON RTRVL ZERO TP 2.4FR (BASKET)
CATH URETL OPEN END 6FR 70 (CATHETERS) IMPLANT
CLOTH BEACON ORANGE TIMEOUT ST (SAFETY) ×2 IMPLANT
GLOVE SURG ENC TEXT LTX SZ7.5 (GLOVE) ×2 IMPLANT
GOWN STRL REUS W/TWL LRG LVL3 (GOWN DISPOSABLE) ×2 IMPLANT
GUIDEWIRE STR DUAL SENSOR (WIRE) ×3 IMPLANT
GUIDEWIRE ZIPWRE .038 STRAIGHT (WIRE) IMPLANT
IV NS 1000ML (IV SOLUTION) ×2
IV NS 1000ML BAXH (IV SOLUTION) ×1 IMPLANT
KIT TURNOVER KIT A (KITS) IMPLANT
LASER FIB FLEXIVA PULSE ID 365 (Laser) IMPLANT
MANIFOLD NEPTUNE II (INSTRUMENTS) ×2 IMPLANT
PACK CYSTO (CUSTOM PROCEDURE TRAY) ×2 IMPLANT
SHEATH NAVIGATOR HD 11/13X36 (SHEATH) IMPLANT
TRACTIP FLEXIVA PULS ID 200XHI (Laser) IMPLANT
TRACTIP FLEXIVA PULSE ID 200 (Laser)
TUBING CONNECTING 10 (TUBING) ×2 IMPLANT
TUBING UROLOGY SET (TUBING) ×2 IMPLANT

## 2021-05-03 NOTE — Anesthesia Procedure Notes (Signed)
Procedure Name: LMA Insertion ?Date/Time: 05/03/2021 3:49 PM ?Performed by: Valentine Barney D, CRNA ?Pre-anesthesia Checklist: Patient identified, Emergency Drugs available, Suction available and Patient being monitored ?Patient Re-evaluated:Patient Re-evaluated prior to induction ?Oxygen Delivery Method: Circle system utilized ?Preoxygenation: Pre-oxygenation with 100% oxygen ?Induction Type: IV induction ?Ventilation: Mask ventilation without difficulty ?LMA: LMA inserted ?LMA Size: 4.0 ?Tube type: Oral ?Number of attempts: 1 ?Placement Confirmation: positive ETCO2 and breath sounds checked- equal and bilateral ?Tube secured with: Tape ?Dental Injury: Teeth and Oropharynx as per pre-operative assessment  ? ? ? ? ?

## 2021-05-03 NOTE — Transfer of Care (Signed)
Immediate Anesthesia Transfer of Care Note ? ?Patient: Michelle Lamb ? ?Procedure(s) Performed: CYSTOSCOPY WITH RIGHT RETROGRADE PYELOGRAM, URETEROSCOPY (Right) ? ?Patient Location: PACU ? ?Anesthesia Type:General ? ?Level of Consciousness: awake, alert  and oriented ? ?Airway & Oxygen Therapy: Patient Spontanous Breathing and Patient connected to face mask oxygen ? ?Post-op Assessment: Report given to RN and Post -op Vital signs reviewed and stable ? ?Post vital signs: Reviewed and stable ? ?Last Vitals:  ?Vitals Value Taken Time  ?BP 129/76 05/03/21 1623  ?Temp    ?Pulse 72 05/03/21 1625  ?Resp 13 05/03/21 1625  ?SpO2 100 % 05/03/21 1625  ?Vitals shown include unvalidated device data. ? ?Last Pain:  ?Vitals:  ? 05/03/21 1422  ?TempSrc: Oral  ?PainSc:   ?   ? ?Patients Stated Pain Goal: 5 (04/30/21 1451) ? ?Complications: No notable events documented. ?

## 2021-05-03 NOTE — Anesthesia Preprocedure Evaluation (Signed)
Anesthesia Evaluation  ?Patient identified by MRN, date of birth, ID band ?Patient awake ? ? ? ?Reviewed: ?Allergy & Precautions, NPO status , Patient's Chart, lab work & pertinent test results ? ?History of Anesthesia Complications ?Negative for: history of anesthetic complications ? ?Airway ?Mallampati: II ? ?TM Distance: >3 FB ?Neck ROM: Full ? ? ? Dental ? ?(+) Teeth Intact, Dental Advisory Given ?  ?Pulmonary ?asthma , sleep apnea ,  ?  ?breath sounds clear to auscultation ? ? ? ? ? ? Cardiovascular ?hypertension, (-) angina(-) Past MI and (-) CHF  ?Rhythm:Regular  ? ?  ?Neuro/Psych ?PSYCHIATRIC DISORDERS Anxiety   ? GI/Hepatic ?Neg liver ROS,   ?Endo/Other  ?Lab Results ?     Component                Value               Date                 ?     HGBA1C                   5.0                 03/05/2021           ? ? Renal/GU ?Renal diseaseLab Results ?     Component                Value               Date                 ?     CREATININE               0.74                04/09/2021           ? ?Kidney stones  ? ?  ?Musculoskeletal ?negative musculoskeletal ROS ?(+)  ? Abdominal ?  ?Peds ? Hematology ?negative hematology ROS ?(+) Lab Results ?     Component                Value               Date                 ?     WBC                      10.8                04/09/2021           ?     HGB                      11.9                04/09/2021           ?     HCT                      36.5                04/09/2021           ?     MCV                      82  04/09/2021           ?     PLT                      434                 04/09/2021           ?   ?Anesthesia Other Findings ? ? Reproductive/Obstetrics ?Lab Results ?     Component                Value               Date                 ?     PREGTESTUR               NEGATIVE            05/03/2021           ?     HCG                      >2,000.0 (H)        11/27/2015           ? ? ?   ? ? ? ? ? ? ? ? ? ? ? ? ? ?  ?  ? ? ? ? ? ? ? ? ?Anesthesia Physical ?Anesthesia Plan ? ?ASA: 2 ? ?Anesthesia Plan: General  ? ?Post-op Pain Management: Minimal or no pain anticipated  ? ?Induction: Intravenous ? ?PONV Risk Score and Plan: 3 and Ondansetron and Dexamethasone ? ?Airway Management Planned: LMA ? ?Additional Equipment: None ? ?Intra-op Plan:  ? ?Post-operative Plan: Extubation in OR ? ?Informed Consent: I have reviewed the patients History and Physical, chart, labs and discussed the procedure including the risks, benefits and alternatives for the proposed anesthesia with the patient or authorized representative who has indicated his/her understanding and acceptance.  ? ? ? ?Dental advisory given ? ?Plan Discussed with: CRNA and Anesthesiologist ? ?Anesthesia Plan Comments:   ? ? ? ? ? ? ?Anesthesia Quick Evaluation ? ?

## 2021-05-03 NOTE — Op Note (Signed)
Preoperative diagnosis: Right hydronephrosis ? ?Postoperative diagnosis: Right ureteral obstruction ? ?Procedures: ?1.  Cystoscopy ?2.  Right retrograde pyelogram with interpretation ?3.  Right ureteroscopy ? ?Surgeon: Pryor Curia MD ? ?Anesthesia: General ? ?Complications: None ? ?EBL: None ? ?Specimens: None ? ?Drains: None ? ?Indication: [name] is a 31 year old female who recently underwent a right robot-assisted laparoscopic partial nephrectomy for a large angiomyolipoma.  She did have some persistent mild right-sided flank pain and follow-up imaging did indicate incidentally detected right hydronephrosis.  Additional imaging raise concern for right ureteral obstruction.  She presents today for further diagnostic evaluation and the above procedures.  The potential risks, complications, and the expected recovery process were discussed in detail.  Informed consent was obtained. ? ?Description of procedure: The patient was taken to the operating room and a general anesthetic was administered.  He was given preoperative antibiotics, placed in the dorsolithotomy position, and prepped and draped in the usual sterile fashion.  Next, a preoperative timeout was performed.  Cystourethroscopy was then performed.  There were no bladder tumors, stones, or other mucosal pathology.  Attention then turned to the right ureteral orifice.  This was cannulated with a 6 French ureteral catheter and Omnipaque contrast was injected.  This demonstrated a normal caliber ureter up to the level of the proximal ureter where there appeared to be a complete obstruction.  A 0.38 sensor guidewire was then advanced up to the level of obstruction and a 6 French semirigid ureteroscope was advanced next to the wire and there did appear to be a complete obstruction of the proximal ureter.  At this point, the procedure was ended.  The ureteroscope was withdrawn and the wire was removed.  The patient tolerated the procedure well without  complications.  She was able to be awakened and transferred to recovery unit in satisfactory condition. ?

## 2021-05-03 NOTE — Discharge Instructions (Signed)
You may see some blood in the urine and may have some burning with urination for 48-72 hours. You also may notice that you have to urinate more frequently or urgently after your procedure which is normal.  You should call should you develop an inability urinate, fever > 101, persistent nausea and vomiting that prevents you from eating or drinking to stay hydrated.    

## 2021-05-03 NOTE — Interval H&P Note (Signed)
H and P updated. No changes. ?

## 2021-05-04 ENCOUNTER — Other Ambulatory Visit: Payer: Self-pay | Admitting: Student

## 2021-05-04 ENCOUNTER — Other Ambulatory Visit (HOSPITAL_COMMUNITY): Payer: Self-pay | Admitting: Interventional Radiology

## 2021-05-04 ENCOUNTER — Encounter (HOSPITAL_COMMUNITY): Payer: Self-pay | Admitting: Urology

## 2021-05-04 ENCOUNTER — Other Ambulatory Visit (HOSPITAL_COMMUNITY): Payer: Self-pay | Admitting: Urology

## 2021-05-04 DIAGNOSIS — N135 Crossing vessel and stricture of ureter without hydronephrosis: Secondary | ICD-10-CM

## 2021-05-04 DIAGNOSIS — N1330 Unspecified hydronephrosis: Secondary | ICD-10-CM

## 2021-05-04 DIAGNOSIS — N1339 Other hydronephrosis: Secondary | ICD-10-CM

## 2021-05-04 NOTE — Anesthesia Postprocedure Evaluation (Signed)
Anesthesia Post Note ? ?Patient: Michelle Lamb ? ?Procedure(s) Performed: CYSTOSCOPY WITH RIGHT RETROGRADE PYELOGRAM, URETEROSCOPY (Right) ? ?  ? ?Patient location during evaluation: PACU ?Anesthesia Type: General ?Level of consciousness: awake and alert ?Pain management: pain level controlled ?Vital Signs Assessment: post-procedure vital signs reviewed and stable ?Respiratory status: spontaneous breathing, nonlabored ventilation, respiratory function stable and patient connected to nasal cannula oxygen ?Cardiovascular status: blood pressure returned to baseline and stable ?Postop Assessment: no apparent nausea or vomiting ?Anesthetic complications: no ? ? ?No notable events documented. ? ?Last Vitals:  ?Vitals:  ? 05/03/21 1700 05/03/21 1724  ?BP: 135/83 125/87  ?Pulse: 79 72  ?Resp: 16 15  ?Temp:  36.5 ?C  ?SpO2: 100% 99%  ?  ?Last Pain:  ?Vitals:  ? 05/03/21 1724  ?TempSrc:   ?PainSc: 2   ? ? ?  ?  ?  ?  ?  ?  ? ?[name] ? ? ? ? ?

## 2021-05-05 ENCOUNTER — Other Ambulatory Visit: Payer: Self-pay

## 2021-05-05 ENCOUNTER — Encounter (HOSPITAL_COMMUNITY): Payer: Self-pay

## 2021-05-05 ENCOUNTER — Other Ambulatory Visit (HOSPITAL_COMMUNITY): Payer: Self-pay | Admitting: Interventional Radiology

## 2021-05-05 ENCOUNTER — Other Ambulatory Visit (HOSPITAL_COMMUNITY): Payer: Self-pay | Admitting: Urology

## 2021-05-05 ENCOUNTER — Ambulatory Visit (HOSPITAL_COMMUNITY)
Admission: RE | Admit: 2021-05-05 | Discharge: 2021-05-05 | Disposition: A | Discharge status: Home / Self Care | Payer: No Typology Code available for payment source | Source: Ambulatory Visit | Attending: Urology | Admitting: Urology

## 2021-05-05 DIAGNOSIS — K219 Gastro-esophageal reflux disease without esophagitis: Secondary | ICD-10-CM | POA: Insufficient documentation

## 2021-05-05 DIAGNOSIS — N1330 Unspecified hydronephrosis: Secondary | ICD-10-CM | POA: Insufficient documentation

## 2021-05-05 DIAGNOSIS — E1122 Type 2 diabetes mellitus with diabetic chronic kidney disease: Secondary | ICD-10-CM | POA: Diagnosis not present

## 2021-05-05 DIAGNOSIS — Z7985 Long-term (current) use of injectable non-insulin antidiabetic drugs: Secondary | ICD-10-CM | POA: Insufficient documentation

## 2021-05-05 DIAGNOSIS — I129 Hypertensive chronic kidney disease with stage 1 through stage 4 chronic kidney disease, or unspecified chronic kidney disease: Secondary | ICD-10-CM | POA: Diagnosis not present

## 2021-05-05 DIAGNOSIS — Z436 Encounter for attention to other artificial openings of urinary tract: Secondary | ICD-10-CM | POA: Diagnosis present

## 2021-05-05 DIAGNOSIS — N189 Chronic kidney disease, unspecified: Secondary | ICD-10-CM | POA: Diagnosis not present

## 2021-05-05 DIAGNOSIS — G4730 Sleep apnea, unspecified: Secondary | ICD-10-CM | POA: Diagnosis not present

## 2021-05-05 HISTORY — PX: IR NEPHROSTOMY PLACEMENT RIGHT: IMG6064

## 2021-05-05 LAB — PROTIME-INR
INR: 1 (ref 0.8–1.2)
Prothrombin Time: 13.2 seconds (ref 11.4–15.2)

## 2021-05-05 LAB — CBC
HCT: 36.5 % (ref 36.0–46.0)
Hemoglobin: 11.7 g/dL — ABNORMAL LOW (ref 12.0–15.0)
MCH: 26.8 pg (ref 26.0–34.0)
MCHC: 32.1 g/dL (ref 30.0–36.0)
MCV: 83.7 fL (ref 80.0–100.0)
Platelets: 309 10*3/uL (ref 150–400)
RBC: 4.36 MIL/uL (ref 3.87–5.11)
RDW: 14.7 % (ref 11.5–15.5)
WBC: 12.3 10*3/uL — ABNORMAL HIGH (ref 4.0–10.5)
nRBC: 0 % (ref 0.0–0.2)

## 2021-05-05 LAB — GLUCOSE, CAPILLARY: Glucose-Capillary: 70 mg/dL (ref 70–99)

## 2021-05-05 LAB — PREGNANCY, URINE: Preg Test, Ur: NEGATIVE

## 2021-05-05 MED ORDER — LIDOCAINE HCL 1 % IJ SOLN
INTRAMUSCULAR | Status: AC
  Administered 2021-05-05: 10 mL via SUBCUTANEOUS
  Filled 2021-05-05: qty 20

## 2021-05-05 MED ORDER — ONDANSETRON HCL 4 MG/2ML IJ SOLN
4.0000 mg | Freq: Once | INTRAMUSCULAR | Status: AC
  Administered 2021-05-05: 4 mg via INTRAVENOUS

## 2021-05-05 MED ORDER — FENTANYL CITRATE (PF) 100 MCG/2ML IJ SOLN
INTRAMUSCULAR | Status: AC
  Filled 2021-05-05: qty 4

## 2021-05-05 MED ORDER — CIPROFLOXACIN IN D5W 400 MG/200ML IV SOLN
400.0000 mg | Freq: Once | INTRAVENOUS | Status: AC
  Administered 2021-05-05: 400 mg via INTRAVENOUS
  Filled 2021-05-05 (×2): qty 200

## 2021-05-05 MED ORDER — MIDAZOLAM HCL 2 MG/2ML IJ SOLN
INTRAMUSCULAR | Status: AC
  Filled 2021-05-05: qty 4

## 2021-05-05 MED ORDER — SODIUM CHLORIDE 0.9% FLUSH: 5.0000 mL | Freq: Three times a day (TID) | INTRAVENOUS | Status: DC

## 2021-05-05 MED ORDER — ONDANSETRON HCL 4 MG/2ML IJ SOLN
INTRAMUSCULAR | Status: AC
  Filled 2021-05-05: qty 2

## 2021-05-05 MED ORDER — SODIUM CHLORIDE 0.9 % IV SOLN: INTRAVENOUS | Status: DC

## 2021-05-05 MED ORDER — FENTANYL CITRATE (PF) 100 MCG/2ML IJ SOLN
INTRAMUSCULAR | Status: AC | PRN
  Administered 2021-05-05: 50 ug via INTRAVENOUS
  Administered 2021-05-05 (×2): 25 ug via INTRAVENOUS
  Administered 2021-05-05: 50 ug via INTRAVENOUS

## 2021-05-05 MED ORDER — MIDAZOLAM HCL 2 MG/2ML IJ SOLN
INTRAMUSCULAR | Status: AC | PRN
  Administered 2021-05-05: 1 mg via INTRAVENOUS
  Administered 2021-05-05: .5 mg via INTRAVENOUS
  Administered 2021-05-05: 1 mg via INTRAVENOUS

## 2021-05-05 MED ORDER — OXYCODONE-ACETAMINOPHEN 2.5-325 MG PO TABS: 1.0000 | ORAL_TABLET | ORAL | 0 refills | Status: DC | PRN

## 2021-05-05 NOTE — Consult Note (Addendum)
Chief Complaint: Patient was seen in consultation today for hydronephrosis  Referring Physician(s): Dr. Raynelle Bring  Supervising Physician: Sandi Mariscal  Patient Status: [facility] - Out-pt  History of Present Illness: Michelle Lamb is a 31 y.o. female with past medical history of right angiomyolipoma and hepatic mass.  She underwent RAL partial nephrectomy 03/15/21 from which she has recovered well.  Coincidentally she underwent recent MR scan for her known hepatic mass and was found to have right hydronephrosis. At that time she did complain of some persistent mild right-sided flank pain post-op. Due to concern for possible right ureteral obstruction she underwent cystourethroscopy 05/03/21 with Dr. Alinda Money which identified a proximal ureteral obstruction.  She is referred to IR for percutaneous nephrostomy tube placement on the right.  Case reviewed and approved by Dr. Pascal Lux.   Patient presents to [facility] Radiology for the procedure today. She has been NPO.  She does not take blood thinners.  She describes increased right flank pressure and discomfort.  She is understanding of the goals of the procedure today and is agreeable to proceed.  She has been NPO.  She does have tramadol at home but states this has not helped and she has no plans to use it further.   Past Medical History:  Diagnosis Date   Anxiety    Asthma    Chronic kidney disease    Complication of anesthesia    Slow to wake up   Depression    Diabetes mellitus without complication ([place])    TYPE 2 LAST DOSE FRIDAY OCTOBER 27 (METFORMIN)   Fatty liver    GERD (gastroesophageal reflux disease)    History of herpes simplex infection    Hyperlipidemia    Hypertension    no meds since gastric   Seasonal allergies    Sleep apnea    Vaginal delivery 2009, 2010, 2015   Vitamin D deficiency     Past Surgical History:  Procedure Laterality Date   CESAREAN SECTION  09/29/2016   CHOLECYSTECTOMY     CYSTOSCOPY WITH RETROGRADE  PYELOGRAM, URETEROSCOPY AND STENT PLACEMENT Right 05/03/2021   Procedure: CYSTOSCOPY WITH RIGHT RETROGRADE PYELOGRAM, URETEROSCOPY;  Surgeon: Raynelle Bring, MD;  Location: [facility];  Service: Urology;  Laterality: Right;   DILATION AND CURETTAGE OF UTERUS N/A 12/31/2015   Procedure: SUCTION DILATATION AND CURETTAGE;  Surgeon: Aletha Halim, MD;  Location: [place] [facility];  Service: Gynecology;  Laterality: N/A;   DILATION AND EVACUATION N/A 01/01/2016   Procedure: DILATATION AND EVACUATION;  Surgeon: Jonnie Kind, MD;  Location: [place] [facility];  Service: Gynecology;  Laterality: N/A;   ESOPHAGOGASTRODUODENOSCOPY     GASTRIC BYPASS  06/02/2020   ROBOTIC ASSITED PARTIAL NEPHRECTOMY Right 03/15/2021   Procedure: XI ROBOTIC ASSITED PARTIAL NEPHRECTOMY;  Surgeon: Raynelle Bring, MD;  Location: [facility];  Service: Urology;  Laterality: Right;   TONSILLECTOMY     TUBAL LIGATION      Allergies: Patient has no known allergies.  Medications: Prior to Admission medications   Medication Sig Start Date End Date Taking? Authorizing Provider  albuterol (VENTOLIN HFA) 108 (90 Base) MCG/ACT inhaler Inhale 2 puffs into the lungs every 6 (six) hours as needed for wheezing or shortness of breath. 06/05/19  Yes [provider]  budesonide-formoterol (SYMBICORT) 80-4.5 MCG/ACT inhaler Inhale 2 puffs into the lungs daily. 06/05/19  Yes [provider]  Cholecalciferol 1.25 MG (50000 UT) capsule Take 1 po twice weekly Patient taking differently: Take 50,000 Units by mouth 2 (two) times a week.  Tuesdays & Thursdays 04/12/21  Yes [name], PA-C  LORazepam (ATIVAN) 1 MG tablet Take 1 tablet (1 mg total) by mouth daily as needed. 03/11/21  Yes [name], PA-C  omeprazole (PRILOSEC) 20 MG capsule Take twice daily while taking prednisone Patient taking differently: Take 20 mg by mouth in the morning. 11/26/20  Yes Rip Harbour, NP  ondansetron (ZOFRAN-ODT) 4 MG disintegrating tablet Take 1 tablet (4 mg total) by  mouth every 8 (eight) hours as needed for nausea or vomiting. 02/12/21  Yes [name], PA-C  Semaglutide, 1 MG/DOSE, (OZEMPIC, 1 MG/DOSE,) 4 MG/3ML SOPN Inject 1 mg into the skin once a week. Patient taking differently: Inject 1 mg into the skin every Tuesday. 03/11/21  Yes [name], PA-C  docusate sodium (COLACE) 100 MG capsule Take 1 capsule (100 mg total) by mouth 2 (two) times daily. Patient taking differently: Take 100 mg by mouth 2 (two) times daily as needed (constipation.). 03/15/21   Michelle Alar, PA-C  gabapentin (NEURONTIN) 100 MG capsule Take 100 mg by mouth daily as needed (pain). 05/06/20   [provider]  traMADol (ULTRAM) 50 MG tablet Take 1-2 tablets (50-100 mg total) by mouth every 6 (six) hours as needed (pain). 05/03/21   Raynelle Bring, MD     Family History  Problem Relation Age of Onset   Hypertension Mother    Cancer Maternal Grandmother        stomach cancer   Depression Maternal Grandfather    Cancer Maternal Grandfather        leukemia   Depression Paternal Grandmother     Social History   Socioeconomic History   Marital status: Married    Spouse name: Not on file   Number of children: 4   Years of education: Not on file   Highest education level: Not on file  Occupational History   Occupation: Physiological scientist  Tobacco Use   Smoking status: Never   Smokeless tobacco: Never  Vaping Use   Vaping Use: Never used  Substance and Sexual Activity   Alcohol use: Yes    Comment: OCCASIONAL   Drug use: No   Sexual activity: Yes    Birth control/protection: Surgical  Other Topics Concern   Not on file  Social History Narrative   Not on file   Social Determinants of Health   Financial Resource Strain: Not on file  Food Insecurity: Not on file  Transportation Needs: Not on file  Physical Activity: Not on file  Stress: Not on file  Social Connections: Not on file     Review of Systems: A 12 point ROS discussed and pertinent  positives are indicated in the HPI above.  All other systems are negative.  Review of Systems  Constitutional:  Negative for fatigue and fever.  Respiratory:  Negative for cough and shortness of breath.   Gastrointestinal:  Negative for abdominal pain.  Genitourinary:  Positive for flank pain. Negative for difficulty urinating and dysuria.  Musculoskeletal:  Positive for back pain.  Psychiatric/Behavioral:  Negative for behavioral problems and confusion.    Vital Signs: BP 132/73    Pulse 64    Temp 98.4 F (36.9 C) (Oral)    Resp 16    Ht '5\' 3"'$  (1.6 m)    Wt 187 lb (84.8 kg)    LMP 04/26/2021 (Exact Date) Comment: urine preg.pending 05/03/21   SpO2 100%    BMI 33.13 kg/m   Physical Exam Vitals and nursing note reviewed.  Constitutional:      General: She is not in acute distress.    Appearance: Normal appearance. She is not ill-appearing.  HENT:     Mouth/Throat:     Mouth: Mucous membranes are moist.     Pharynx: Oropharynx is clear.  Cardiovascular:     Rate and Rhythm: Normal rate and regular rhythm.     Pulses: Normal pulses.  Pulmonary:     Breath sounds: Normal breath sounds.  Abdominal:     General: Abdomen is flat.     Palpations: Abdomen is soft.     Comments: Incisions from recent surgery in stages of healing  Neurological:     General: No focal deficit present.     Mental Status: She is alert and oriented to person, place, and time.  Psychiatric:        Mood and Affect: Mood normal.        Behavior: Behavior normal.        Thought Content: Thought content normal.        Judgment: Judgment normal.     MD Evaluation Airway: WNL Heart: WNL Abdomen: WNL Chest/ Lungs: WNL ASA  Classification: 3 Mallampati/Airway Score: Two   Imaging: MR Abdomen W Wo Contrast  Result Date: 04/27/2021 CLINICAL DATA:  Evaluate indeterminate liver lesion identified on previous exam. EXAM: MRI ABDOMEN WITHOUT AND WITH CONTRAST TECHNIQUE: Multiplanar multisequence MR imaging of  the abdomen was performed both before and after the administration of intravenous contrast. CONTRAST:  27m MULTIHANCE GADOBENATE DIMEGLUMINE 529 MG/ML IV SOLN COMPARISON:  CT AP 01/04/2021 and MR abdomen 11/29/2020 FINDINGS: Lower chest: No acute findings. Hepatobiliary: Persistent but improved appearance of diffuse hepatic steatosis. Previously noted scattered T1 hyperintense nodules noted on the out of phase sequence images significantly diminished in multiplicity compared with previous exam. A few tiny areas of focal nodular fatty sparing identified within the right lobe, image 6/9-1 and image 10/9-1. The previously characterized dominant lesion in segment 4A/4B the measures 2.2 x 2.2 by 1.7 cm, image 38/15. Previously this measured 3.3 x 2.9 by 2.7 cm. The previously characterized 0.9 x 0.8 cm segment 3 arterial phase enhancing lesion is no longer visualized. No new enhancing liver lesions identified at this time. Status post cholecystectomy. No bile duct dilatation. Pancreas: No mass, inflammatory changes, or other parenchymal abnormality identified. Spleen:  Within normal limits in size and appearance. Adrenals/Urinary Tract:  Normal adrenal glands. Status post partial nephrectomy from the upper pole of the right kidney. There is new right-sided hydronephrosis up to the level of the UPJ. Etiology indeterminate. Unremarkable appearance of the left kidney. Stomach/Bowel: Postoperative changes from gastric bypass surgery. No dilated bowel loops identified. Vascular/Lymphatic: No pathologically enlarged lymph nodes identified. No abdominal aortic aneurysm demonstrated. Other:  There is no free fluid or fluid collections. Musculoskeletal: No suspicious bone lesions identified. IMPRESSION: 1. Interval improvement in previously noted severe diffuse hepatic steatosis. 2. The previously characterized lesion in segment 4A/4B is decreased in size compared with previous exam. The signal and enhancement characteristics  are favored to represent benign process, likely benign adenoma versus severe focal fatty deposition. 3. Additional small scattered nodular densities are also improved in the interval. 4. Status post partial nephrectomy from the upper pole of the right kidney. 5. New right-sided hydronephrosis up to the level of the UPJ. Etiology indeterminate. Urologic consultation is recommended. 6. Insert PRA call report Electronically Signed   By: TKerby MoorsM.D.   On: 04/27/2021 09:18   DG C-Arm 1-60  Min-No Report  Result Date: 05/03/2021 Fluoroscopy was utilized by the requesting physician.  No radiographic interpretation.    Labs:  CBC: Recent Labs    03/05/21 1355 03/15/21 1050 03/16/21 0418 04/09/21 0940 05/03/21 1426 05/05/21 0835  WBC 10.6*  --   --  10.8 10.9* 12.3*  HGB 11.6*   < > 10.8* 11.9 11.3* 11.7*  HCT 37.4   < > 33.9* 36.5 35.1* 36.5  PLT 321  --   --  434 326 309   < > = values in this interval not displayed.    COAGS: Recent Labs    05/05/21 0835  INR 1.0    BMP: Recent Labs    03/15/21 1050 03/16/21 0418 03/17/21 0435 04/09/21 0940 05/03/21 1426  NA 135 135 133* 139 138  K 3.4* 3.8 3.5 4.2 4.1  CL 104 106 105 102 106  CO2 '24 23 23 23 25  '$ GLUCOSE 106* 68* 90 78 76  BUN '11 9 12 12 14  '$ CALCIUM 8.2* 8.2* 8.1* 9.6 8.9  CREATININE 0.59 0.66 0.66 0.74 0.63  GFRNONAA >60 >60 >60  --  >60    LIVER FUNCTION TESTS: Recent Labs    09/16/20 0858 04/09/21 0940  BILITOT 0.5 0.6  AST 24 22  ALT 23 24  ALKPHOS 75 92  PROT 6.5 6.7  ALBUMIN 4.4 4.4    TUMOR MARKERS: No results for input(s): AFPTM, CEA, CA199, CHROMGRNA in the last 8760 hours.  Assessment and Plan: Right percutaneous nephrostomy tube placement  Patient with past medical history of right angiomyolipoma s/p partical nephrectomy presents with complaint of proximal ureteral obstruction, hydronephrosis.  IR consulted for percutaneous nephrostomy tube placement at the request of Dr. Alinda Money. Case  reviewed by Dr. Pascal Lux who approves patient for procedure.  Patient presents today in their usual state of health.  She has been NPO and is not currently on blood thinners.   Discussed the nature of PCN placement.  Patient aware that if she does not tolerate the procedure or has s/s sepsis post-procedure this may warrant admission.  Goal is discharge this afternoon with her husband and mother if possible.   Cipro '400mg'$  prophylactically given.   Patient will be given Percocet 5/325 #20 for home.  Risks and benefits of right PCN placement was discussed with the patient including, but not limited to, infection, bleeding, significant bleeding causing loss or decrease in renal function or damage to adjacent structures.   All of the patient's questions were answered, patient is agreeable to proceed.  Consent signed and in chart.   Thank you for this interesting consult.  I greatly enjoyed meeting Lizzete Gough and look forward to participating in their care.  A copy of this report was sent to the requesting provider on this date.  Electronically Signed: Docia Barrier, PA 05/05/2021, 9:40 AM   I spent a total of  40 Minutes   in face to face in clinical consultation, greater than 50% of which was counseling/coordinating care for right hydronephrosis.

## 2021-05-05 NOTE — Procedures (Signed)
Pre Procedure Dx: Proximal right ureteral obstruction. ?Post Procedural Dx: Same ? ?Successful [place] and fluoroscopic guided placement of a right sided PCN with end coiled and locked within the renal pelvis. ?PCN connected to gravity bag. ? ?EBL: Minimal ?Complications: None immediate. ? ?Ronny Bacon, MD ?Pager #: [phone] ? ?  ? ?

## 2021-05-08 ENCOUNTER — Other Ambulatory Visit: Payer: Self-pay | Admitting: Physician Assistant

## 2021-05-08 MED ORDER — OXYCODONE-ACETAMINOPHEN 5-325 MG PO TABS: 1.0000 | ORAL_TABLET | Freq: Four times a day (QID) | ORAL | 0 refills | Status: DC | PRN

## 2021-05-11 ENCOUNTER — Other Ambulatory Visit: Payer: Self-pay | Admitting: Radiology

## 2021-05-12 ENCOUNTER — Other Ambulatory Visit (HOSPITAL_COMMUNITY): Payer: No Typology Code available for payment source

## 2021-05-12 ENCOUNTER — Other Ambulatory Visit: Payer: Self-pay

## 2021-05-12 ENCOUNTER — Ambulatory Visit (HOSPITAL_COMMUNITY)
Admission: RE | Admit: 2021-05-12 | Discharge: 2021-05-12 | Disposition: A | Discharge status: Home / Self Care | Payer: No Typology Code available for payment source | Source: Ambulatory Visit | Attending: Interventional Radiology | Admitting: Interventional Radiology

## 2021-05-12 ENCOUNTER — Encounter (HOSPITAL_COMMUNITY): Payer: Self-pay

## 2021-05-12 ENCOUNTER — Other Ambulatory Visit (HOSPITAL_COMMUNITY): Payer: Self-pay | Admitting: Interventional Radiology

## 2021-05-12 DIAGNOSIS — N189 Chronic kidney disease, unspecified: Secondary | ICD-10-CM | POA: Diagnosis not present

## 2021-05-12 DIAGNOSIS — J45909 Unspecified asthma, uncomplicated: Secondary | ICD-10-CM | POA: Diagnosis not present

## 2021-05-12 DIAGNOSIS — E785 Hyperlipidemia, unspecified: Secondary | ICD-10-CM | POA: Insufficient documentation

## 2021-05-12 DIAGNOSIS — E1122 Type 2 diabetes mellitus with diabetic chronic kidney disease: Secondary | ICD-10-CM | POA: Insufficient documentation

## 2021-05-12 DIAGNOSIS — N1330 Unspecified hydronephrosis: Secondary | ICD-10-CM | POA: Diagnosis not present

## 2021-05-12 DIAGNOSIS — G4730 Sleep apnea, unspecified: Secondary | ICD-10-CM | POA: Insufficient documentation

## 2021-05-12 DIAGNOSIS — F419 Anxiety disorder, unspecified: Secondary | ICD-10-CM | POA: Diagnosis not present

## 2021-05-12 DIAGNOSIS — Z7985 Long-term (current) use of injectable non-insulin antidiabetic drugs: Secondary | ICD-10-CM | POA: Diagnosis not present

## 2021-05-12 DIAGNOSIS — I129 Hypertensive chronic kidney disease with stage 1 through stage 4 chronic kidney disease, or unspecified chronic kidney disease: Secondary | ICD-10-CM | POA: Diagnosis not present

## 2021-05-12 DIAGNOSIS — K219 Gastro-esophageal reflux disease without esophagitis: Secondary | ICD-10-CM | POA: Insufficient documentation

## 2021-05-12 DIAGNOSIS — Z436 Encounter for attention to other artificial openings of urinary tract: Secondary | ICD-10-CM | POA: Insufficient documentation

## 2021-05-12 HISTORY — PX: IR NEPHROSTOMY EXCHANGE RIGHT: IMG6070

## 2021-05-12 LAB — CBC WITH DIFFERENTIAL/PLATELET
Abs Immature Granulocytes: 0.03 10*3/uL (ref 0.00–0.07)
Basophils Absolute: 0.1 10*3/uL (ref 0.0–0.1)
Basophils Relative: 1 %
Eosinophils Absolute: 0.3 10*3/uL (ref 0.0–0.5)
Eosinophils Relative: 3 %
HCT: 40.2 % (ref 36.0–46.0)
Hemoglobin: 12.7 g/dL (ref 12.0–15.0)
Immature Granulocytes: 0 %
Lymphocytes Relative: 34 %
Lymphs Abs: 3.7 10*3/uL (ref 0.7–4.0)
MCH: 26.6 pg (ref 26.0–34.0)
MCHC: 31.6 g/dL (ref 30.0–36.0)
MCV: 84.1 fL (ref 80.0–100.0)
Monocytes Absolute: 0.7 10*3/uL (ref 0.1–1.0)
Monocytes Relative: 7 %
Neutro Abs: 6.3 10*3/uL (ref 1.7–7.7)
Neutrophils Relative %: 55 %
Platelets: 354 10*3/uL (ref 150–400)
RBC: 4.78 MIL/uL (ref 3.87–5.11)
RDW: 14.6 % (ref 11.5–15.5)
WBC: 11.2 10*3/uL — ABNORMAL HIGH (ref 4.0–10.5)
nRBC: 0 % (ref 0.0–0.2)

## 2021-05-12 LAB — BASIC METABOLIC PANEL
Anion gap: 7 (ref 5–15)
BUN: 14 mg/dL (ref 6–20)
CO2: 27 mmol/L (ref 22–32)
Calcium: 9.1 mg/dL (ref 8.9–10.3)
Chloride: 102 mmol/L (ref 98–111)
Creatinine, Ser: 0.54 mg/dL (ref 0.44–1.00)
GFR, Estimated: >60 mL/min (ref >60)
Glucose, Bld: 78 mg/dL (ref 70–99)
Potassium: 3.8 mmol/L (ref 3.5–5.1)
Sodium: 136 mmol/L (ref 135–145)

## 2021-05-12 LAB — GLUCOSE, CAPILLARY: Glucose-Capillary: 70 mg/dL (ref 70–99)

## 2021-05-12 IMAGING — XA IR EXCHANGE NEPHROSTOMY RIGHT
1 series · 4 of 4 positions shown · non-contrast
Comparison: Image guided right-sided nephrostomy catheter
placement-[DATE];

INDICATION: History of right renal AML resection complicated by proximal right
ureteral occlusion, post failed right-sided retrograde ureteral
stent placement, ultimately requiring right-sided nephrostomy
catheter placement on [DATE]

Patient presents today for definitive antegrade nephrostogram and
attempted ureteral stent placement.
EXAM:
1. ANTEGRADE NEPHROSTOGRAM AND ATTEMPTED RIGHT URETERAL STENT
PLACEMENT
2. FLUOROSCOPIC GUIDED RIGHT SIDED NEPHROSTOMY CATHETER EXCHANGE
TECHNIQUE: Informed written consent was obtained from the patient after a
discussion of the risks, benefits and alternatives to treatment.
Questions regarding the procedure were encouraged and answered. A
timeout was performed prior to the initiation of the procedure.

[Series 1: ir ureteral stent placement existing acc · 4 of 127 frames shown]
[frame 9/127]
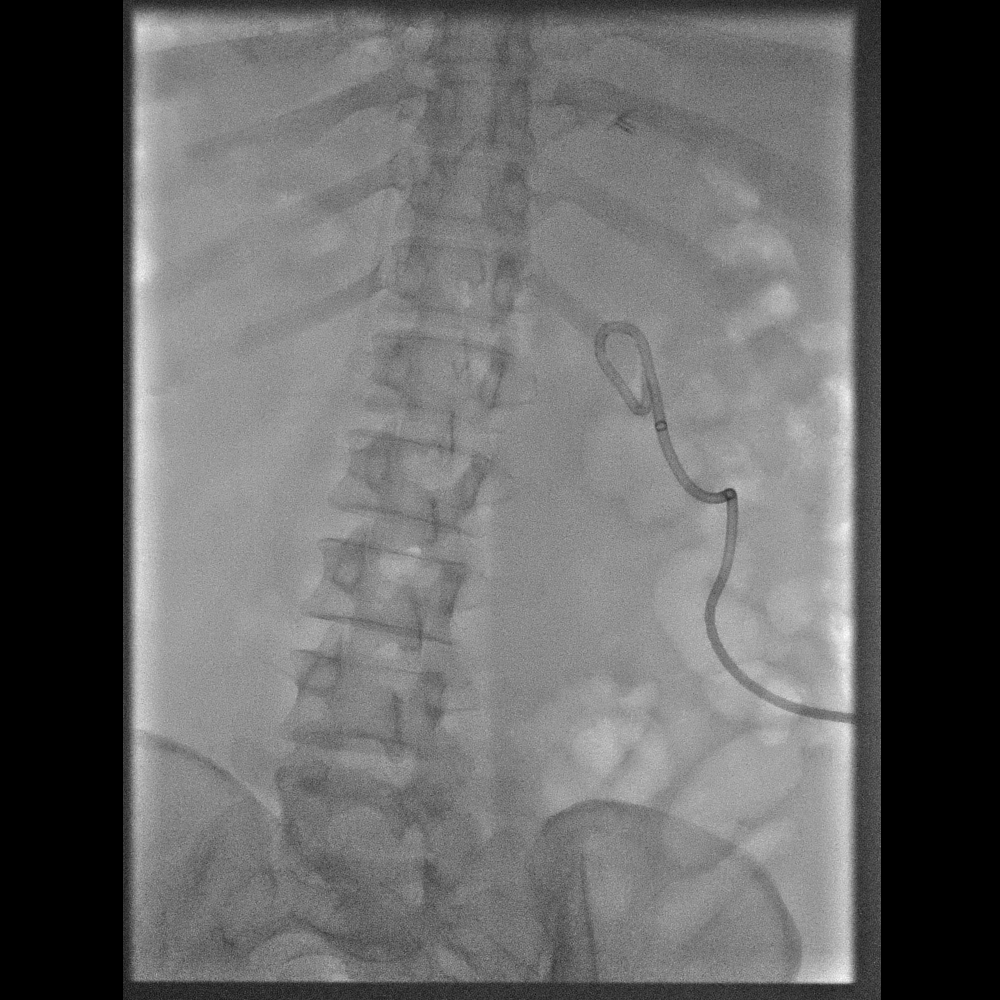
[frame 20/127]
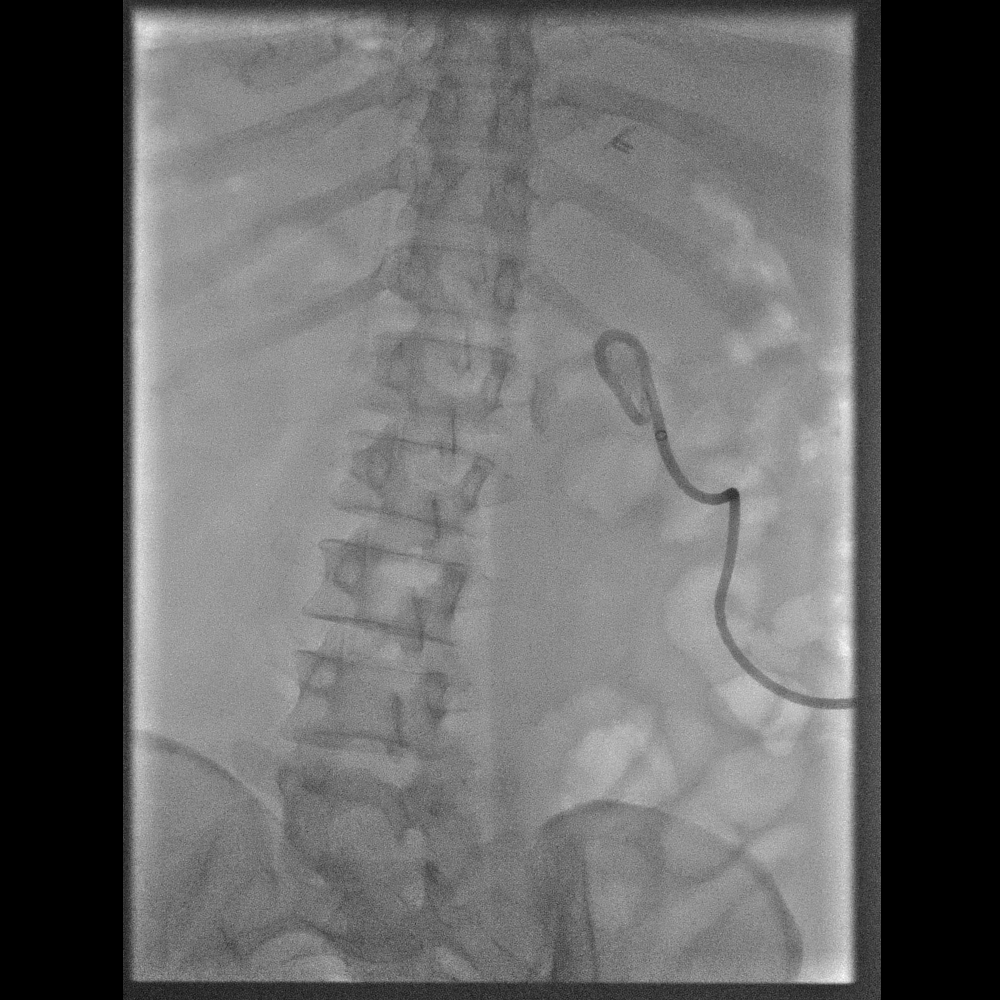
[frame 64/127]
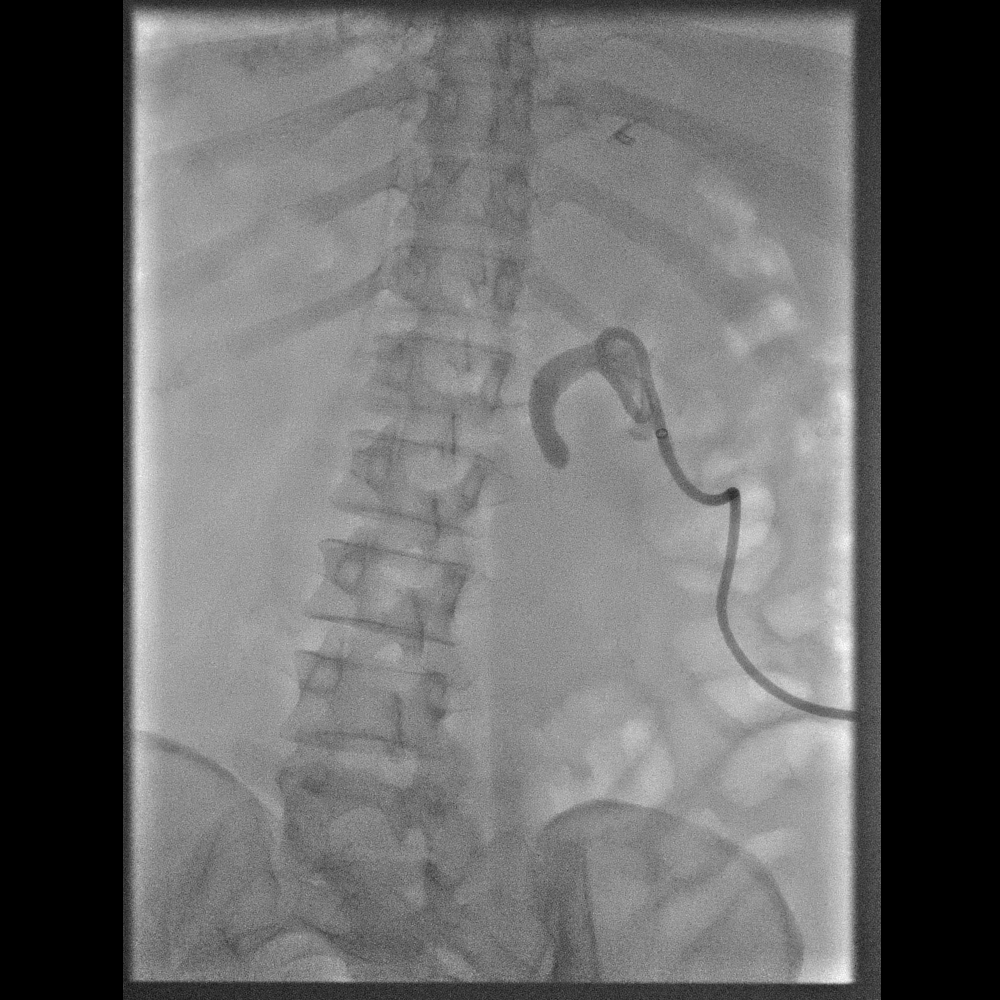
[frame 108/127]
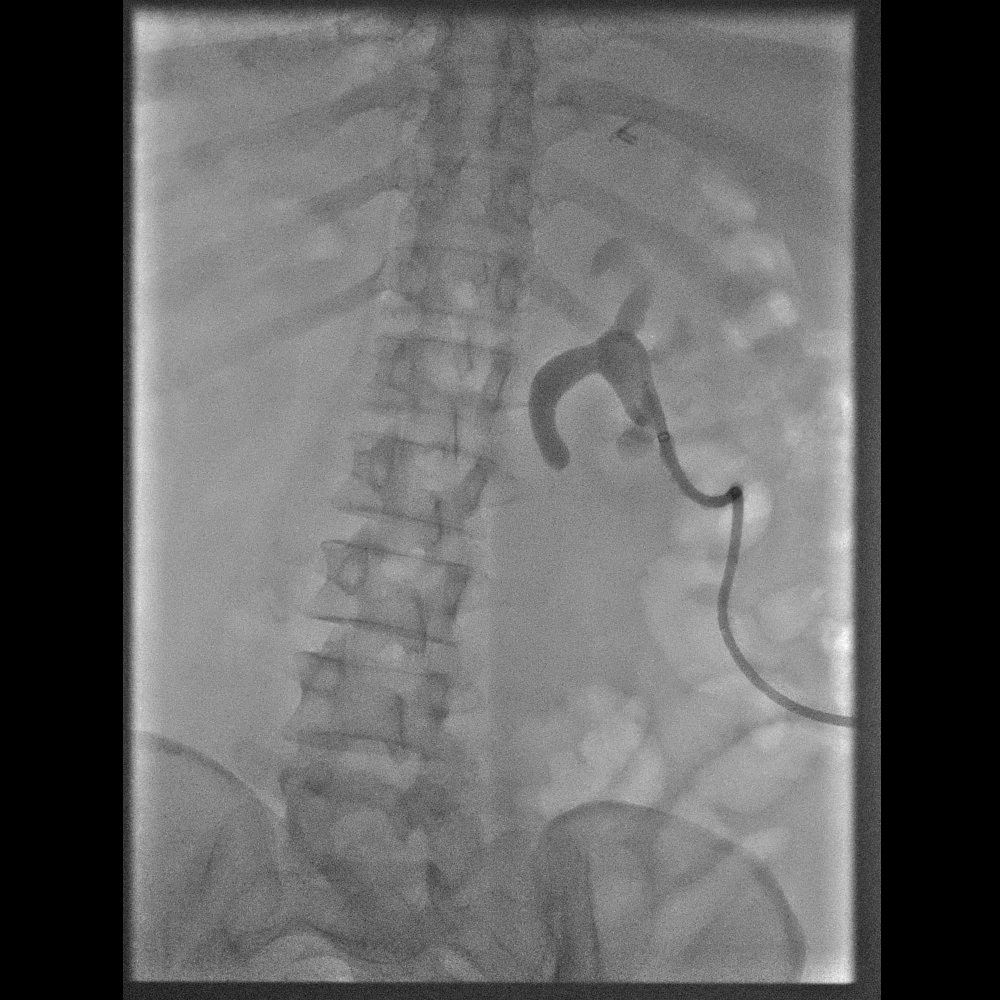

[4 of 4 positions shown; findings below may reference images not displayed]

intraoperative images during right-sided
retrograde pyelogram and attempted ureteral stent
placement-[DATE]

CT abdomen pelvis-[DATE]

ANESTHESIA/SEDATION:
ANESTHESIA/SEDATION
Moderate (conscious) sedation was employed during this procedure as
administered by the [REDACTED].

A total of Versed 4 mg and Fentanyl 100 mcg was administered
intravenously.

Moderate Sedation Time: 19 minutes. The patient's level of
consciousness and vital signs were monitored continuously by
radiology nursing throughout the procedure under my direct
supervision.

MEDICATIONS:
Rocephin 2 g IV; the antibiotics were administered within an
appropriate time frame prior to the initiation of the procedure.

CONTRAST:  25 cc Omnipaque 300 administered into the collecting
system

FLUOROSCOPY TIME:  8 minutes, 30 seconds (97 mGy)

COMPLICATIONS:
None immediate.
The right flank and external portion of existing nephrostomy
catheter were prepped and draped in the usual sterile fashion. A
sterile drape was applied covering the operative field. Maximum
barrier sterile technique with sterile gowns and gloves were used
for the procedure. A timeout was performed prior to the initiation
of the procedure.

A pre procedural spot fluoroscopic image was obtained after contrast
was injected via the existing nephrostomy catheter demonstrating
appropriate positioning within the renal pelvis. The existing
nephrostomy catheter was cut and cannulated with a short Amplatz
wire. Next under intermittent fluoroscopic guidance, the nephrostomy
catheter was exchanged for a 8 French radiopaque tip vascular
sheath.

Next, with the use of a Kumpe catheter, prolonged efforts were made
to recannulate the occluded proximal aspect of the right ureter with
both a regular and stiff glide wires however this ultimately proved
unsuccessful

As such, over a short Amplatz wire, the vascular sheath was
exchanged for a new 10.2 French all-purpose drainage catheter with
end coiled and locked within the diminutive right renal pelvis.

Contrast injection confirmed appropriate positioning. Nephrostomy
catheter was secured at the skin entrance site within interrupted
suture and a Stat Lock device. A dressing was applied. A dressing
was placed. The patient tolerated the procedure well without
immediate postprocedural complication.
FINDINGS: The existing nephrostomy catheter is appropriately positioned and
functioning.

Antegrade nephrostogram demonstrates persistent complete occlusion
of the proximal aspect of the right ureter.

Despite prolonged efforts, the superior aspect the right ureter
could not be recanalized.

After fluoroscopic guided exchange, the new right-sided nephrostomy
catheter is coiled within diminutive right renal pelvis.
IMPRESSION: 1. Attempted though ultimately unsuccessful recanalization of the
persistently occluded superior aspect of the right ureter.
2. Successful fluoroscopic guided exchange of right sided
French percutaneous nephrostomy catheter.

## 2021-05-12 MED ORDER — FENTANYL CITRATE (PF) 100 MCG/2ML IJ SOLN
INTRAMUSCULAR | Status: AC | PRN
  Administered 2021-05-12: 50 ug via INTRAVENOUS

## 2021-05-12 MED ORDER — MIDAZOLAM HCL 2 MG/2ML IJ SOLN
INTRAMUSCULAR | Status: AC | PRN
  Administered 2021-05-12: 2 mg via INTRAVENOUS

## 2021-05-12 MED ORDER — FENTANYL CITRATE (PF) 100 MCG/2ML IJ SOLN
INTRAMUSCULAR | Status: AC | PRN
Start: 2021-05-12 — End: 2021-05-12
  Administered 2021-05-12: 50 ug via INTRAVENOUS

## 2021-05-12 MED ORDER — MIDAZOLAM HCL 2 MG/2ML IJ SOLN
INTRAMUSCULAR | Status: AC
  Filled 2021-05-12: qty 4

## 2021-05-12 MED ORDER — LIDOCAINE HCL 1 % IJ SOLN
INTRAMUSCULAR | Status: AC
  Administered 2021-05-12: 5 mL
  Filled 2021-05-12: qty 20

## 2021-05-12 MED ORDER — IOHEXOL 300 MG/ML  SOLN
50.0000 mL | Freq: Once | INTRAMUSCULAR | Status: AC | PRN
Start: 2021-05-12 — End: 2021-05-12
  Administered 2021-05-12: 25 mL

## 2021-05-12 MED ORDER — SODIUM CHLORIDE 0.9 % IV SOLN: INTRAVENOUS | Status: DC

## 2021-05-12 MED ORDER — DEXTROSE 5 % IV SOLN: INTRAVENOUS | Status: DC

## 2021-05-12 MED ORDER — SODIUM CHLORIDE 0.9% FLUSH: 5.0000 mL | Freq: Three times a day (TID) | INTRAVENOUS | Status: DC

## 2021-05-12 MED ORDER — MIDAZOLAM HCL 2 MG/2ML IJ SOLN
INTRAMUSCULAR | Status: AC | PRN
  Administered 2021-05-12: 1 mg via INTRAVENOUS

## 2021-05-12 MED ORDER — LIDOCAINE HCL (PF) 1 % IJ SOLN
INTRAMUSCULAR | Status: AC | PRN
  Administered 2021-05-12: 10 mL via INTRADERMAL

## 2021-05-12 MED ORDER — SODIUM CHLORIDE 0.9 % IV SOLN
2.0000 g | Freq: Once | INTRAVENOUS | Status: AC
  Administered 2021-05-12: 2 g via INTRAVENOUS
  Filled 2021-05-12 (×2): qty 20

## 2021-05-12 MED ORDER — FENTANYL CITRATE (PF) 100 MCG/2ML IJ SOLN
INTRAMUSCULAR | Status: AC
  Filled 2021-05-12: qty 4

## 2021-05-12 NOTE — H&P (Signed)
Chief Complaint: Patient was seen in consultation today for nephrostogram with possible ureteral stent placement at the request of Watts,John  Referring Physician(s): Watts,John  Supervising Physician: Simonne Come  Patient Status: [facility] - Out-pt  History of Present Illness: Michelle Lamb is a 31 y.o. female w/ PMH of anxiety, asthma, CKD, DM II, GERD, HLD, HTN and sleep apnea. Pt underwent right RAL partial nephrectomy 03/15/21. She subsequently complained of persistent right flank pain and was found to have hydronephrosis. Cystourethroscopy by Dr. Heloise Purpura found complete obstruction of the right proximal ureter. Pt had right percutaneous nephrostomy tube placed with Dr. Grace Isaac, IR, 05/05/21. Pt presents today for right nephrostogram with possible ureteral stent placement.   Past Medical History:  Diagnosis Date   Anxiety    Asthma    Chronic kidney disease    Complication of anesthesia    Slow to wake up   Depression    Diabetes mellitus without complication (HCC)    TYPE 2 LAST DOSE FRIDAY OCTOBER 27 (METFORMIN)   Fatty liver    GERD (gastroesophageal reflux disease)    History of herpes simplex infection    Hyperlipidemia    Hypertension    no meds since gastric   Seasonal allergies    Sleep apnea    Vaginal delivery 2009, 2010, 2015   Vitamin D deficiency     Past Surgical History:  Procedure Laterality Date   CESAREAN SECTION  09/29/2016   CHOLECYSTECTOMY     CYSTOSCOPY WITH RETROGRADE PYELOGRAM, URETEROSCOPY AND STENT PLACEMENT Right 05/03/2021   Procedure: CYSTOSCOPY WITH RIGHT RETROGRADE PYELOGRAM, URETEROSCOPY;  Surgeon: Heloise Purpura, MD;  Location: [facility];  Service: Urology;  Laterality: Right;   DILATION AND CURETTAGE OF UTERUS N/A 12/31/2015   Procedure: SUCTION DILATATION AND CURETTAGE;  Surgeon: Beulah Bing, MD;  Location: [facility];  Service: Gynecology;  Laterality: N/A;   DILATION AND EVACUATION N/A 01/01/2016   Procedure: DILATATION AND EVACUATION;   Surgeon: Tilda Burrow, MD;  Location: [facility];  Service: Gynecology;  Laterality: N/A;   ESOPHAGOGASTRODUODENOSCOPY     GASTRIC BYPASS  06/02/2020   IR NEPHROSTOMY PLACEMENT RIGHT  05/05/2021   ROBOTIC ASSITED PARTIAL NEPHRECTOMY Right 03/15/2021   Procedure: XI ROBOTIC ASSITED PARTIAL NEPHRECTOMY;  Surgeon: Heloise Purpura, MD;  Location: [facility];  Service: Urology;  Laterality: Right;   TONSILLECTOMY     TUBAL LIGATION      Allergies: Patient has no known allergies.  Medications: Prior to Admission medications   Medication Sig Start Date End Date Taking? Authorizing Provider  albuterol (VENTOLIN HFA) 108 (90 Base) MCG/ACT inhaler Inhale 2 puffs into the lungs every 6 (six) hours as needed for wheezing or shortness of breath. 06/05/19   [provider]  budesonide-formoterol (SYMBICORT) 80-4.5 MCG/ACT inhaler Inhale 2 puffs into the lungs daily. 06/05/19   [provider]  Cholecalciferol 1.25 MG (50000 UT) capsule Take 1 po twice weekly Patient taking differently: Take 50,000 Units by mouth 2 (two) times a week. Tuesdays & Thursdays 04/12/21   Marianne Sofia, PA-C  docusate sodium (COLACE) 100 MG capsule Take 1 capsule (100 mg total) by mouth 2 (two) times daily. Patient taking differently: Take 100 mg by mouth 2 (two) times daily as needed (constipation.). 03/15/21   Harrie Foreman, PA-C  gabapentin (NEURONTIN) 100 MG capsule Take 100 mg by mouth daily as needed (pain). 05/06/20   [provider]  LORazepam (ATIVAN) 1 MG tablet Take 1 tablet (1 mg total) by mouth daily as  needed. 03/11/21   Marianne Sofia, PA-C  omeprazole (PRILOSEC) 20 MG capsule Take twice daily while taking prednisone Patient taking differently: Take 20 mg by mouth in the morning. 11/26/20   Janie Morning, NP  ondansetron (ZOFRAN-ODT) 4 MG disintegrating tablet Take 1 tablet (4 mg total) by mouth every 8 (eight) hours as needed for nausea or vomiting. 02/12/21   Marianne Sofia, PA-C  oxyCODONE-acetaminophen  (PERCOCET/ROXICET) 5-325 MG tablet Take 1 tablet by mouth every 6 (six) hours as needed for severe pain. 05/08/21   Marianne Sofia, PA-C  Semaglutide, 1 MG/DOSE, (OZEMPIC, 1 MG/DOSE,) 4 MG/3ML SOPN Inject 1 mg into the skin once a week. Patient taking differently: Inject 1 mg into the skin every Tuesday. 03/11/21   Marianne Sofia, PA-C     Family History  Problem Relation Age of Onset   Hypertension Mother    Cancer Maternal Grandmother        stomach cancer   Depression Maternal Grandfather    Cancer Maternal Grandfather        leukemia   Depression Paternal Grandmother     Social History   Socioeconomic History   Marital status: Married    Spouse name: Not on file   Number of children: 4   Years of education: Not on file   Highest education level: Not on file  Occupational History   Occupation: Scientist, forensic  Tobacco Use   Smoking status: Never   Smokeless tobacco: Never  Vaping Use   Vaping Use: Never used  Substance and Sexual Activity   Alcohol use: Yes    Comment: OCCASIONAL   Drug use: No   Sexual activity: Yes    Birth control/protection: Surgical  Other Topics Concern   Not on file  Social History Narrative   Not on file   Social Determinants of Health   Financial Resource Strain: Not on file  Food Insecurity: Not on file  Transportation Needs: Not on file  Physical Activity: Not on file  Stress: Not on file  Social Connections: Not on file    Review of Systems: A 12 point ROS discussed and pertinent positives are indicated in the HPI above.  All other systems are negative.  Review of Systems  Constitutional:  Negative for chills and fever.  Respiratory:  Negative for cough and shortness of breath.   Cardiovascular:  Negative for chest pain.  Musculoskeletal:  Positive for back pain.   Vital Signs: BP (!) 154/88   Pulse 82   Temp 98.4 F (36.9 C) (Oral)   Resp 18   LMP 04/26/2021 (Exact Date) Comment: urine preg.pending 05/03/21  SpO2  100%   Physical Exam Constitutional:      Appearance: Normal appearance.  HENT:     Head: Normocephalic and atraumatic.  Eyes:     Extraocular Movements: Extraocular movements intact.     Pupils: Pupils are equal, round, and reactive to light.  Cardiovascular:     Rate and Rhythm: Normal rate and regular rhythm.     Pulses: Normal pulses.     Heart sounds: Normal heart sounds.  Pulmonary:     Effort: Pulmonary effort is normal. No respiratory distress.  Abdominal:     Palpations: Abdomen is soft.  Musculoskeletal:     Right lower leg: No edema.     Left lower leg: No edema.  Skin:    General: Skin is warm and dry.     Comments: R nephrostomy tube in place draining clear, yellow urine  Neurological:     Mental Status: She is alert and oriented to person, place, and time.  Psychiatric:        Mood and Affect: Mood normal.        Behavior: Behavior normal.        Thought Content: Thought content normal.        Judgment: Judgment normal.    Imaging: MR Abdomen W Wo Contrast  Result Date: 04/27/2021 CLINICAL DATA:  Evaluate indeterminate liver lesion identified on previous exam. EXAM: MRI ABDOMEN WITHOUT AND WITH CONTRAST TECHNIQUE: Multiplanar multisequence MR imaging of the abdomen was performed both before and after the administration of intravenous contrast. CONTRAST:  15mL MULTIHANCE GADOBENATE DIMEGLUMINE 529 MG/ML IV SOLN COMPARISON:  CT AP 01/04/2021 and MR abdomen 11/29/2020 FINDINGS: Lower chest: No acute findings. Hepatobiliary: Persistent but improved appearance of diffuse hepatic steatosis. Previously noted scattered T1 hyperintense nodules noted on the out of phase sequence images significantly diminished in multiplicity compared with previous exam. A few tiny areas of focal nodular fatty sparing identified within the right lobe, image 6/9-1 and image 10/9-1. The previously characterized dominant lesion in segment 4A/4B the measures 2.2 x 2.2 by 1.7 cm, image 38/15.  Previously this measured 3.3 x 2.9 by 2.7 cm. The previously characterized 0.9 x 0.8 cm segment 3 arterial phase enhancing lesion is no longer visualized. No new enhancing liver lesions identified at this time. Status post cholecystectomy. No bile duct dilatation. Pancreas: No mass, inflammatory changes, or other parenchymal abnormality identified. Spleen:  Within normal limits in size and appearance. Adrenals/Urinary Tract:  Normal adrenal glands. Status post partial nephrectomy from the upper pole of the right kidney. There is new right-sided hydronephrosis up to the level of the UPJ. Etiology indeterminate. Unremarkable appearance of the left kidney. Stomach/Bowel: Postoperative changes from gastric bypass surgery. No dilated bowel loops identified. Vascular/Lymphatic: No pathologically enlarged lymph nodes identified. No abdominal aortic aneurysm demonstrated. Other:  There is no free fluid or fluid collections. Musculoskeletal: No suspicious bone lesions identified. IMPRESSION: 1. Interval improvement in previously noted severe diffuse hepatic steatosis. 2. The previously characterized lesion in segment 4A/4B is decreased in size compared with previous exam. The signal and enhancement characteristics are favored to represent benign process, likely benign adenoma versus severe focal fatty deposition. 3. Additional small scattered nodular densities are also improved in the interval. 4. Status post partial nephrectomy from the upper pole of the right kidney. 5. New right-sided hydronephrosis up to the level of the UPJ. Etiology indeterminate. Urologic consultation is recommended. 6. Insert PRA call report Electronically Signed   By: Signa Kell M.D.   On: 04/27/2021 09:18   DG C-Arm 1-60 Min-No Report  Result Date: 05/03/2021 Fluoroscopy was utilized by the requesting physician.  No radiographic interpretation.   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 Patient presents today for image guided placement of right-sided nephrostomy catheter for urinary diversion purposes. EXAM: ULTRASOUND AND FLUOROSCOPIC GUIDED PLACEMENT OF RIGHT NEPHROSTOMY TUBE COMPARISON:  CT abdomen and pelvis-04/29/2021 Intraoperative images during right-sided retrograde pyelogram and attempted ureteral stent placement-05/03/2021 MEDICATIONS: Ciprofloxacin 400 mg IV; The antibiotic was administered in an appropriate time frame prior to skin puncture. ANESTHESIA/SEDATION: Moderate (conscious) sedation was employed during this procedure as administered by the Interventional Radiology RN. A total of Versed 2.5 mg and Fentanyl 150 mcg was administered intravenously. Moderate Sedation Time: 25 minutes.  The patient's level of consciousness and vital signs were monitored continuously by radiology nursing throughout the procedure under my direct supervision. CONTRAST:  25 cc Omnipaque 300-administered into the renal collecting system FLUOROSCOPY TIME:  4 minutes, 30 seconds COMPLICATIONS: None immediate. PROCEDURE: The procedure, risks, benefits, and alternatives were explained to the patient, questions were encouraged and answered and informed consent was obtained. A timeout was performed prior to the initiation of the procedure. The operative site was prepped and draped in the usual sterile fashion and a sterile drape was applied covering the operative field. A sterile gown and sterile gloves were used for the procedure. Local anesthesia was provided with 1% Lidocaine with epinephrine. Ultrasound was used to localize the right kidney. Under direct ultrasound guidance, a 20 gauge needle was advanced into the renal collecting system. An ultrasound image documentation was performed. Access within the collecting system was confirmed with the efflux of urine followed by limited  contrast injection. Under intermittent fluoroscopic guidance, an 0.018 wire was advanced into the collecting system and the tract was dilated with an Accustick stent. Next, over a short Amplatz wire, the track was further dilated ultimately allowing placement of a 10-French percutaneous nephrostomy catheter with end coiled and locked within the renal pelvis. Contrast was injected and several spot fluoroscopic images were obtained in various obliquities. The catheter was secured at the skin entrance site with an interrupted suture and a stat lock device and connected to a gravity bag. Dressings were applied. The patient tolerated procedure well without immediate postprocedural complication. FINDINGS: Ultrasound scanning demonstrates a mildly dilated right renal collecting system, similar to abdominal CT performed 04/29/2021. Under a combination of ultrasound and fluoroscopic guidance, a posterior inferior calix was targeted allowing placement of a 10-French percutaneous nephrostomy catheter with end coiled and locked within the renal pelvis. Contrast injection confirmed appropriate positioning trauma however demonstrates apparent occlusion of the proximal aspect of the right ureter. IMPRESSION: Successful ultrasound and fluoroscopic guided placement of a right sided 10 French PCN. PLAN: - Maintain nephrostomy catheter to external drainage. - Flush nephrostomy catheter with 10 cc normal saline twice per day until hematuria resolves. - The patient will return for definitive right-sided antegrade nephrostogram and potential ureteral stent placement next Wednesday, 3/15, at [facility]. Electronically Signed   By: Simonne Come M.D.   On: 05/05/2021 13:22    Labs:  CBC: Recent Labs    04/09/21 0940 05/03/21 1426 05/05/21 0835 05/12/21 1320  WBC 10.8 10.9* 12.3* 11.2*  HGB 11.9 11.3* 11.7* 12.7  HCT 36.5 35.1* 36.5 40.2  PLT 434 326 309 354    COAGS: Recent Labs    05/05/21 0835  INR 1.0     BMP: Recent Labs    03/16/21 0418 03/17/21 0435 04/09/21 0940 05/03/21 1426 05/12/21 1320  NA 135 133* 139 138 136  K 3.8 3.5 4.2 4.1 3.8  CL 106 105 102 106 102  CO2 23 23 23 25 27   GLUCOSE 68* 90 78 76 78  BUN 9 12 12 14 14   CALCIUM 8.2* 8.1* 9.6 8.9 9.1  CREATININE 0.66 0.66 0.74 0.63 0.54  GFRNONAA >60 >60  --  >60 >60    LIVER FUNCTION TESTS: Recent Labs    09/16/20 0858 04/09/21 0940  BILITOT 0.5 0.6  AST 24 22  ALT 23 24  ALKPHOS 75 92  PROT 6.5 6.7  ALBUMIN 4.4 4.4    TUMOR MARKERS: No results for input(s): AFPTM, CEA, CA199, CHROMGRNA in the last  8760 hours.  Assessment and Plan: History of anxiety, asthma, CKD, DM II, GERD, HLD, HTN and sleep apnea. Pt underwent right RAL partial nephrectomy 03/15/21. She subsequently complained of persistent right flank pain and was found to have hydronephrosis. Cystourethroscopy by Dr. Heloise Purpura found complete obstruction of the right proximal ureter. Pt had right percutaneous nephrostomy tube placed with Dr. Grace Isaac, IR, 05/05/21. Pt presents today for right nephrostogram with possible ureteral stent placement.   Pt A&O, calm and pleasant. She is in no distress. Pt is NPO per order.  Pt WBC today 11.2 VSS  Risks and benefits of nephrostogram with possible stent placement was discussed with the patient including, but not limited to, infection, bleeding, significant bleeding causing loss or decrease in renal function or damage to adjacent structures.   All of the patient's questions were answered, patient is agreeable to proceed.  Consent signed and in chart.  Thank you for this interesting consult.  I greatly enjoyed meeting Michelle Lamb and look forward to participating in their care.  A copy of this report was sent to the requesting provider on this date.  Electronically Signed: Shon Hough, NP 05/12/2021, 2:24 PM   I spent a total of 20 minutes in face to face in clinical consultation, greater than 50% of  which was counseling/coordinating care for right nephrostogram with possible ureteral stent placement.

## 2021-05-12 NOTE — Procedures (Signed)
Pre Procedure Dx: Proximal right ureteral obstruction ?Post Procedural Dx: Same ? ?Antegrade nephrostogram demonstrates complete occlusion of the proximal aspect of the right ureter. ?Attempted though unsuccessful re-canalization of the right ureter. ? ?Successful fluoroscopic guided exchanged of right sided PCN with end coiled and locked within the renal pelvis. ?PCN connected to gravity bag. ? ?EBL: Minimal ? ?Complications: None immediate. ? ?Ronny Bacon, MD ?Pager #: [phone] ? ?  ? ?

## 2021-05-13 ENCOUNTER — Other Ambulatory Visit (HOSPITAL_COMMUNITY): Payer: Self-pay | Admitting: Urology

## 2021-05-13 ENCOUNTER — Other Ambulatory Visit: Payer: Self-pay | Admitting: Urology

## 2021-05-25 ENCOUNTER — Ambulatory Visit (HOSPITAL_COMMUNITY)
Admission: RE | Admit: 2021-05-25 | Discharge: 2021-05-25 | Disposition: A | Discharge status: Home / Self Care | Payer: No Typology Code available for payment source | Source: Ambulatory Visit | Attending: Urology | Admitting: Urology

## 2021-05-25 ENCOUNTER — Other Ambulatory Visit: Payer: Self-pay

## 2021-05-25 DIAGNOSIS — N131 Hydronephrosis with ureteral stricture, not elsewhere classified: Secondary | ICD-10-CM | POA: Insufficient documentation

## 2021-05-25 IMAGING — NM NM RENAL IMAGING FLOW W/ PHARM
2 series · 12 of 12 positions shown · non-contrast
Comparison: CT abdomen and pelvis [DATE]

CLINICAL DATA: Hydronephrosis with ureteral stricture, RIGHT
nephrostomy tube placed [DATE], post partial RIGHT nephrectomy
[DATE]

EXAM:
NUCLEAR MEDICINE RENAL SCAN WITH DIURETIC ADMINISTRATION
TECHNIQUE: Radionuclide angiographic and sequential renal images were obtained
after intravenous injection of radiopharmaceutical. Imaging was
continued during slow intravenous injection of Lasix approximately
15 minutes after the start of the examination.
RADIOPHARMACEUTICALS:  4.4 mCi [LV] MAG3 IV
Pharmaceutical: Lasix 42 mg IV

[Series 1: renal scan · 4.14mm/px · 6 of 90 frames shown (1 of 2)]
[frame 8/90]
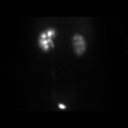
[frame 23/90]
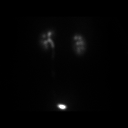
[frame 38/90]
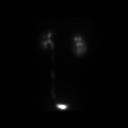
[frame 53/90]
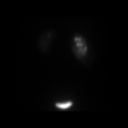
[frame 68/90]
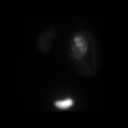
[frame 83/90]
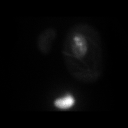

[Series 1: renal scan · 4.14mm/px · 6 of 40 frames shown (2 of 2)]
[frame 4/40  full-range]
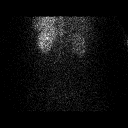
[frame 10/40  full-range]
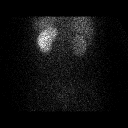
[frame 17/40  full-range]
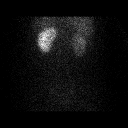
[frame 24/40  full-range]
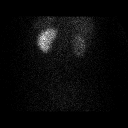
[frame 30/40  full-range]
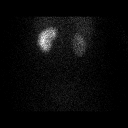
[frame 37/40  full-range]
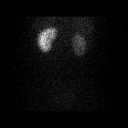

[12 of 12 positions shown; findings below may reference images not displayed]

FINDINGS: Flow: Prompt blood flow to both kidneys, asymmetrically decreased on
RIGHT.

Left renogram: Normal uptake, concentration and excretion of tracer
by LEFT kidney. Good clearance of tracer before and continuing
following Lasix administration. No abnormal residual tracer at
conclusion of exam. Analysis of the renogram curve demonstrates a
normal time to peak activity of 4.7 minutes with fall to half
maximum activity 6.7 minutes later

Right renogram: Normal uptake and concentration of tracer by RIGHT
kidney. Central photopenic region consistent with dilated collecting
system. Excretion of tracer via nephrostomy tube. RIGHT kidney does
demonstrate a decrease in tracer activity over the course of the
exam. Significant tracer is seen projecting over the RIGHT abdomen,
corresponding to a leak from the nephrostomy tube connection,
contaminating a bed. This impacts the renogram curve. Delayed time
to peak activity of 23.7 minutes. At initial fall in tracer is seen
followed by plateau consistent with urine contamination. Visually,
the RIGHT kidney likely achieves near or slightly over 50% clearance
of tracer.

Differential:

Left kidney = 75 %

Right kidney = 25 %

T1/2 post Lasix :

Left kidney = 13.3 min

Right kidney = N/A min, likely impacted by contamination on bed
IMPRESSION: Normal LEFT renogram.

Asymmetric renal function LEFT much better than RIGHT.

RIGHT hydronephrosis with delayed clearance of tracer.

Due to leak from the nephrostomy tube and contamination of the bed,
renogram curve is artificially elevated over the including 15
minutes of the exam; at least partial clearance of tracer from the
RIGHT kidney is visualized, estimated near or slightly over 50%
clearance visually.

## 2021-05-25 MED ORDER — FUROSEMIDE 10 MG/ML IJ SOLN
INTRAMUSCULAR | Status: AC
  Filled 2021-05-25: qty 4

## 2021-05-25 MED ORDER — FUROSEMIDE 10 MG/ML IJ SOLN: 40.0000 mg | Freq: Once | INTRAMUSCULAR | Status: DC

## 2021-05-25 MED ORDER — TECHNETIUM TC 99M MERTIATIDE
4.4000 | Freq: Once | INTRAVENOUS | Status: AC
  Administered 2021-05-25: 5.4 via INTRAVENOUS

## 2021-06-02 ENCOUNTER — Other Ambulatory Visit: Payer: Self-pay | Admitting: Urology

## 2021-06-04 ENCOUNTER — Emergency Department (HOSPITAL_COMMUNITY): Payer: No Typology Code available for payment source

## 2021-06-04 ENCOUNTER — Emergency Department (HOSPITAL_COMMUNITY)
Admission: EM | Admit: 2021-06-04 | Discharge: 2021-06-04 | Disposition: A | Discharge status: Home / Self Care | Payer: No Typology Code available for payment source | Attending: Emergency Medicine | Admitting: Emergency Medicine

## 2021-06-04 ENCOUNTER — Encounter (HOSPITAL_COMMUNITY): Payer: Self-pay | Admitting: Emergency Medicine

## 2021-06-04 DIAGNOSIS — Z7984 Long term (current) use of oral hypoglycemic drugs: Secondary | ICD-10-CM | POA: Insufficient documentation

## 2021-06-04 DIAGNOSIS — E1122 Type 2 diabetes mellitus with diabetic chronic kidney disease: Secondary | ICD-10-CM | POA: Diagnosis not present

## 2021-06-04 DIAGNOSIS — J45909 Unspecified asthma, uncomplicated: Secondary | ICD-10-CM | POA: Diagnosis not present

## 2021-06-04 DIAGNOSIS — Z7951 Long term (current) use of inhaled steroids: Secondary | ICD-10-CM | POA: Diagnosis not present

## 2021-06-04 DIAGNOSIS — N189 Chronic kidney disease, unspecified: Secondary | ICD-10-CM | POA: Insufficient documentation

## 2021-06-04 DIAGNOSIS — R109 Unspecified abdominal pain: Secondary | ICD-10-CM

## 2021-06-04 DIAGNOSIS — Z936 Other artificial openings of urinary tract status: Secondary | ICD-10-CM | POA: Insufficient documentation

## 2021-06-04 DIAGNOSIS — N12 Tubulo-interstitial nephritis, not specified as acute or chronic: Secondary | ICD-10-CM | POA: Insufficient documentation

## 2021-06-04 DIAGNOSIS — D72829 Elevated white blood cell count, unspecified: Secondary | ICD-10-CM | POA: Diagnosis not present

## 2021-06-04 LAB — CBC
HCT: 35.8 % — ABNORMAL LOW (ref 36.0–46.0)
Hemoglobin: 11.4 g/dL — ABNORMAL LOW (ref 12.0–15.0)
MCH: 26.5 pg (ref 26.0–34.0)
MCHC: 31.8 g/dL (ref 30.0–36.0)
MCV: 83.3 fL (ref 80.0–100.0)
Platelets: 301 10*3/uL (ref 150–400)
RBC: 4.3 MIL/uL (ref 3.87–5.11)
RDW: 15.2 % (ref 11.5–15.5)
WBC: 13.4 10*3/uL — ABNORMAL HIGH (ref 4.0–10.5)
nRBC: 0 % (ref 0.0–0.2)

## 2021-06-04 LAB — URINALYSIS, ROUTINE W REFLEX MICROSCOPIC
Bilirubin Urine: NEGATIVE
Glucose, UA: NEGATIVE mg/dL
Ketones, ur: NEGATIVE mg/dL
Nitrite: POSITIVE — AB
Protein, ur: 100 mg/dL — AB
RBC / HPF: >50 RBC/hpf — ABNORMAL HIGH (ref 0–5)
Specific Gravity, Urine: 1.008 (ref 1.005–1.030)
WBC, UA: >50 WBC/hpf — ABNORMAL HIGH (ref 0–5)
pH: 8 (ref 5.0–8.0)

## 2021-06-04 LAB — COMPREHENSIVE METABOLIC PANEL
ALT: 19 U/L (ref 0–44)
AST: 17 U/L (ref 15–41)
Albumin: 3.6 g/dL (ref 3.5–5.0)
Alkaline Phosphatase: 74 U/L (ref 38–126)
Anion gap: 6 (ref 5–15)
BUN: 9 mg/dL (ref 6–20)
CO2: 28 mmol/L (ref 22–32)
Calcium: 8.7 mg/dL — ABNORMAL LOW (ref 8.9–10.3)
Chloride: 102 mmol/L (ref 98–111)
Creatinine, Ser: 0.55 mg/dL (ref 0.44–1.00)
GFR, Estimated: >60 mL/min (ref >60)
Glucose, Bld: 85 mg/dL (ref 70–99)
Potassium: 3.7 mmol/L (ref 3.5–5.1)
Sodium: 136 mmol/L (ref 135–145)
Total Bilirubin: 0.9 mg/dL (ref 0.3–1.2)
Total Protein: 6.6 g/dL (ref 6.5–8.1)

## 2021-06-04 LAB — I-STAT BETA HCG BLOOD, ED (MC, WL, AP ONLY): I-stat hCG, quantitative: <5 m[IU]/mL (ref <5)

## 2021-06-04 IMAGING — CT CT ABD-PELV W/ CM
2 of 5 series · 16 of 46 positions shown, 18 images · IV contrast (OMNIPAQUE 300)
Comparison: CT abdomen pelvis, [DATE], MR abdomen, [DATE]

CLINICAL DATA: Concern for infection at site of nephrostomy tube

EXAM:
CT ABDOMEN AND PELVIS WITH CONTRAST
TECHNIQUE: Multidetector CT imaging of the abdomen and pelvis was performed
using the standard protocol following bolus administration of
intravenous contrast.

[Series 2: axial st · axial · 0.73mm/px · z∈[-458,-48]mm · 13 of 96 slices shown, 15 images]
[im 7/96  soft-tissue]
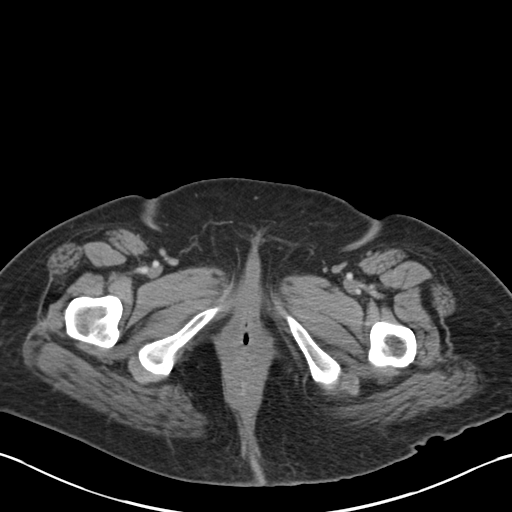
[im 7/96  bone]
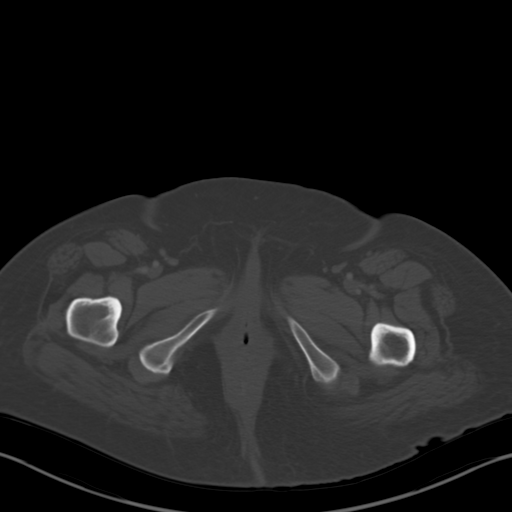
[im 14/96  soft-tissue]
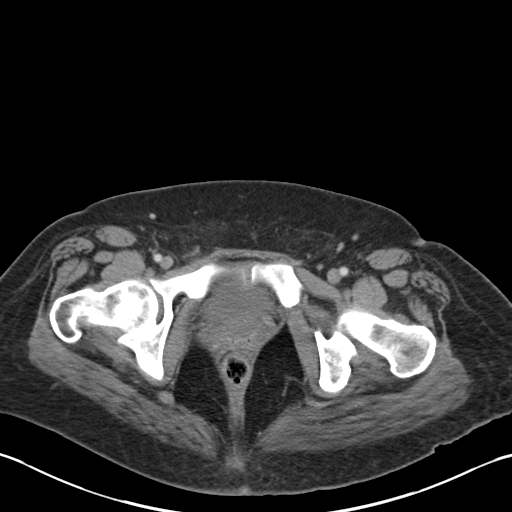
[im 21/96  soft-tissue]
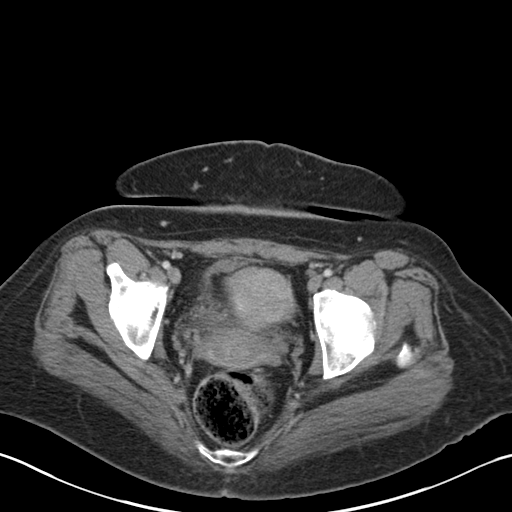
[im 28/96  soft-tissue]
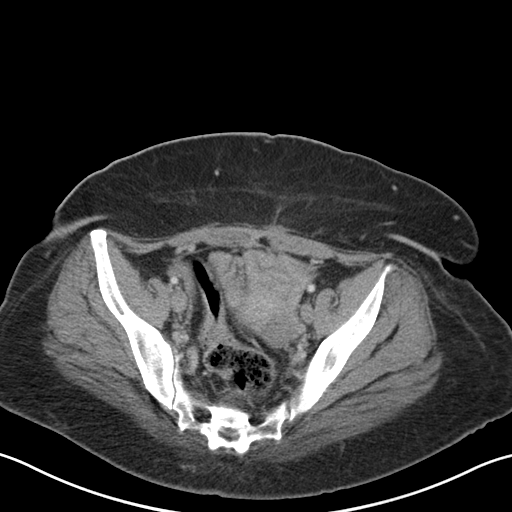
[im 34/96  soft-tissue]
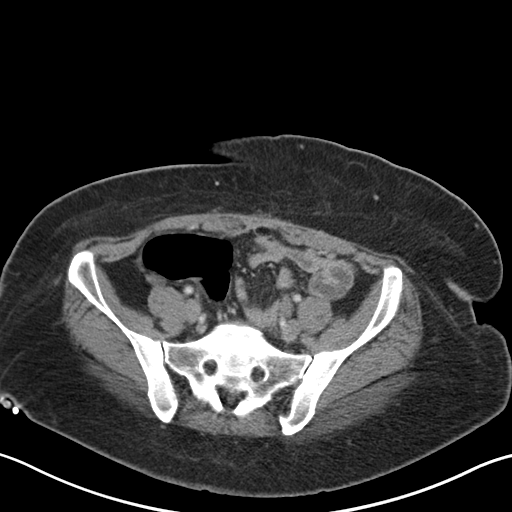
[im 41/96  soft-tissue]
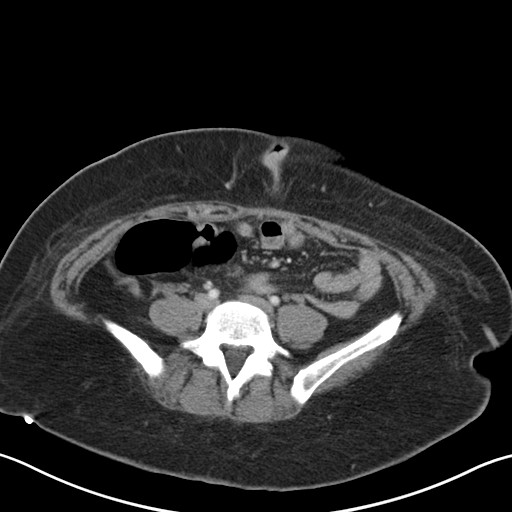
[im 48/96  soft-tissue]
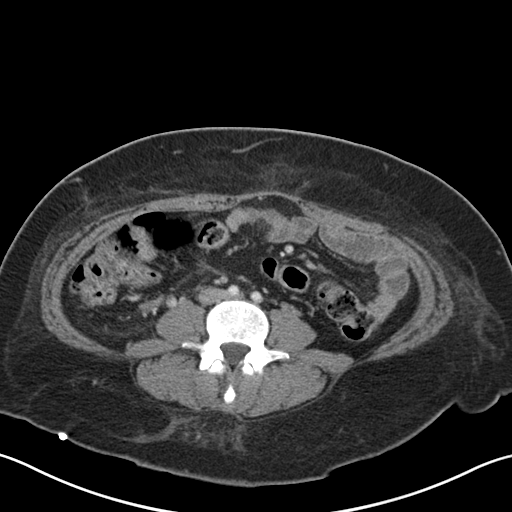
[im 55/96  soft-tissue]
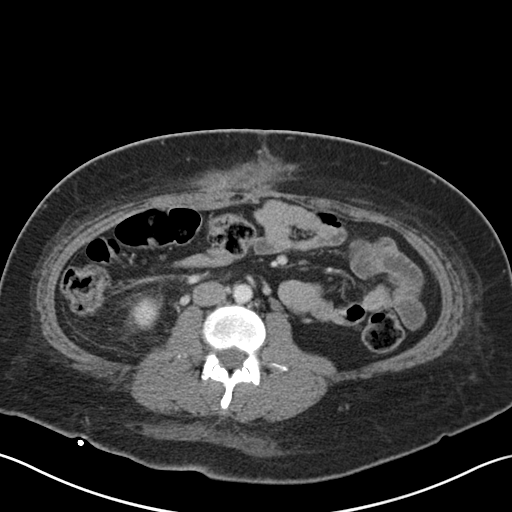
[im 62/96  soft-tissue]
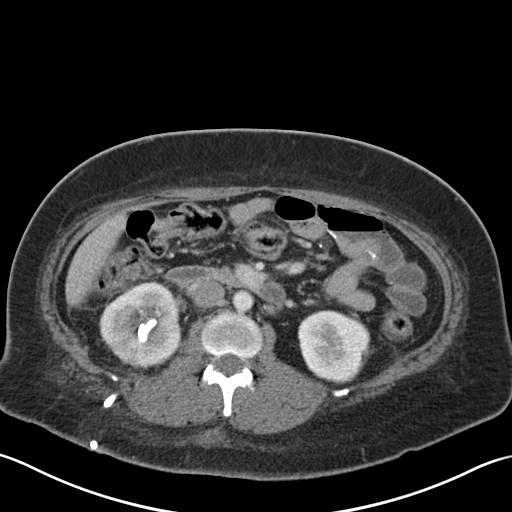
[im 62/96  bone]
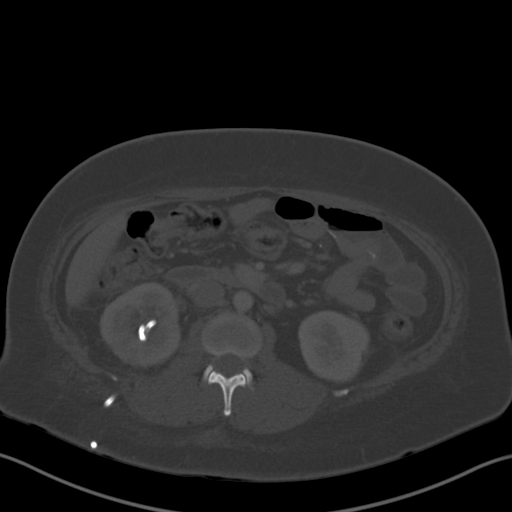
[im 68/96  soft-tissue]
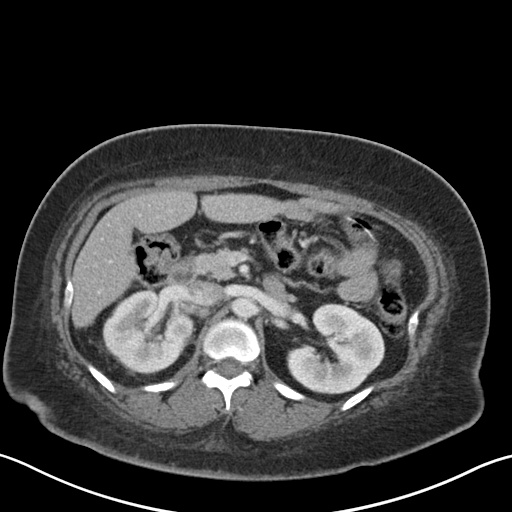
[im 75/96  soft-tissue]
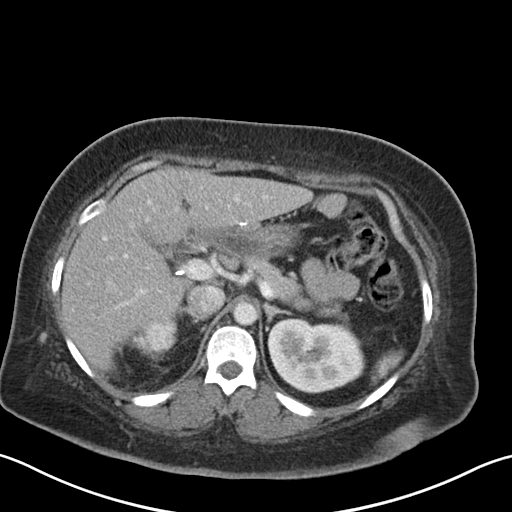
[im 82/96  soft-tissue]
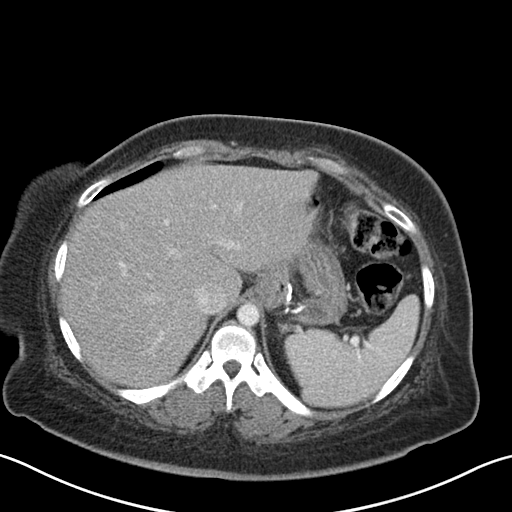
[im 89/96  soft-tissue]
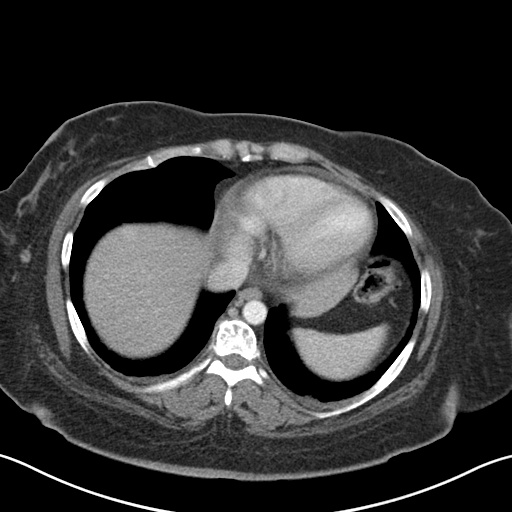

[Series 4: coronal st · coronal · 0.86mm/px · 3 of 142 slices shown]
[im 48/142  soft-tissue]
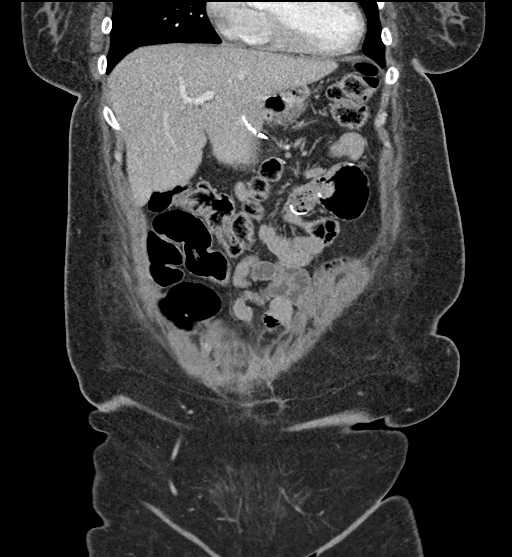
[im 63/142  soft-tissue]
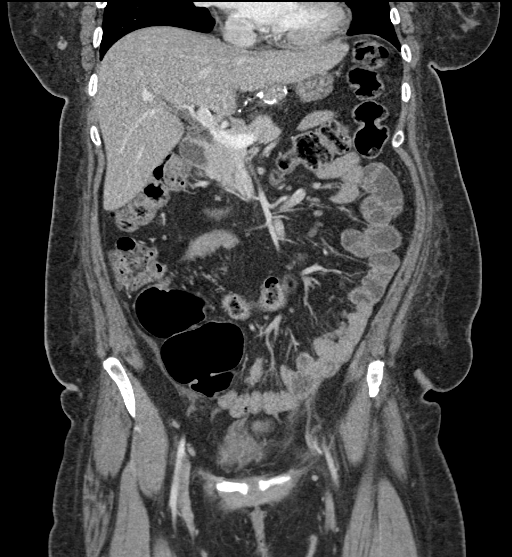
[im 79/142  soft-tissue]
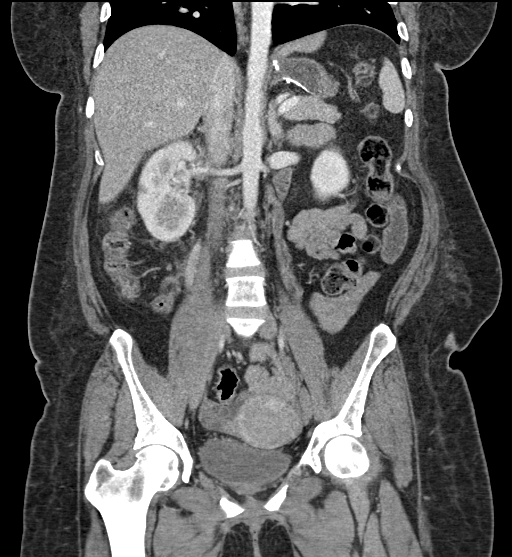

[16 of 46 positions shown; findings below may reference images not displayed]

RADIATION DOSE REDUCTION: This exam was performed according to the
departmental dose-optimization program which includes automated
exposure control, adjustment of the mA and/or kV according to
patient size and/or use of iterative reconstruction technique.

CONTRAST:  100mL OMNIPAQUE IOHEXOL 300 MG/ML  SOLN
FINDINGS: Lower chest: No acute abnormality.

Hepatobiliary: Unchanged heterogeneously enhancing lesion of the
central liver, measuring 2.2 x 2.1 cm, previously characterized by
MR as a possible adenoma (series 2, image 20). Status post
cholecystectomy. No biliary dilatation.

Pancreas: Unremarkable. No pancreatic ductal dilatation or
surrounding inflammatory changes.

Spleen: Normal in size without significant abnormality.

Adrenals/Urinary Tract: Adrenal glands are unremarkable. Right-sided
percutaneous nephrostomy tube, with formed pigtail in the right
renal pelves. No residual hydronephrosis or hydroureter. No evidence
of fluid collection or other soft tissue abnormality associated with
the nephrostomy catheter or tract. Left kidney is normal, without
renal calculi, solid lesion, or hydronephrosis. Bladder is
unremarkable.

Stomach/Bowel: Status post Roux type gastric bypass. Appendix
appears normal. No evidence of bowel wall thickening, distention, or
inflammatory changes.

Vascular/Lymphatic: No significant vascular findings are present. No
enlarged abdominal or pelvic lymph nodes.

Reproductive: No mass or other significant abnormality.

Other: No abdominal wall hernia or abnormality. No ascites.

Musculoskeletal: No acute or significant osseous findings.
IMPRESSION: 1. Right-sided percutaneous nephrostomy tube, with formed pigtail in
the right renal pelves. No residual hydronephrosis or hydroureter.

2. No evidence of fluid collection or other soft tissue abnormality
associated with the nephrostomy catheter or tract.

## 2021-06-04 MED ORDER — SODIUM CHLORIDE 0.9 % IV SOLN: 1.0000 g | Freq: Once | INTRAVENOUS | Status: DC

## 2021-06-04 MED ORDER — CIPROFLOXACIN HCL 500 MG PO TABS: 500.0000 mg | ORAL_TABLET | Freq: Two times a day (BID) | ORAL | 0 refills | Status: AC

## 2021-06-04 MED ORDER — PIPERACILLIN-TAZOBACTAM 3.375 G IVPB 30 MIN: 3.3750 g | Freq: Once | INTRAVENOUS | Status: DC

## 2021-06-04 MED ORDER — SODIUM CHLORIDE (PF) 0.9 % IJ SOLN
INTRAMUSCULAR | Status: AC
  Filled 2021-06-04: qty 50

## 2021-06-04 MED ORDER — OXYCODONE-ACETAMINOPHEN 5-325 MG PO TABS: 1.0000 | ORAL_TABLET | Freq: Four times a day (QID) | ORAL | 0 refills | Status: DC | PRN

## 2021-06-04 MED ORDER — FENTANYL CITRATE PF 50 MCG/ML IJ SOSY
50.0000 ug | PREFILLED_SYRINGE | Freq: Once | INTRAMUSCULAR | Status: AC
  Administered 2021-06-04: 50 ug via INTRAVENOUS
  Filled 2021-06-04: qty 1

## 2021-06-04 MED ORDER — IOHEXOL 300 MG/ML  SOLN
100.0000 mL | Freq: Once | INTRAMUSCULAR | Status: AC | PRN
  Administered 2021-06-04: 100 mL via INTRAVENOUS

## 2021-06-04 MED ORDER — CEFTRIAXONE SODIUM 1 G IJ SOLR
1.0000 g | Freq: Once | INTRAMUSCULAR | Status: AC
  Administered 2021-06-04: 1 g via INTRAVENOUS
  Filled 2021-06-04: qty 10

## 2021-06-04 NOTE — ED Provider Notes (Signed)
?[facility] DEPT ?Provider Note ? ? ?CSN: [id number] ?Arrival date & time: 06/04/21  0720 ? ?  ? ?History ? ?Chief Complaint  ?Patient presents with  ? Flank Pain  ? ? ?Michelle Lamb is a 31 y.o. female who presents emergency department complaining of right flank pain for 2 days.  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 This was exchanged under fluoroscopy on 3/15. For the past 2-3 days she's had increasing pain over the site of the site of the right nephrostomy tube that her prescribed Tramadol is not helping. She also reports temperature last night of 100.39F. She believes the tube is draining well but she may have had some drainage from around the tube itself.  ? ? ?Flank Pain ? ? ?  ? ?Home Medications ?Prior to Admission medications   ?Medication Sig Start Date End Date Taking? Authorizing Provider  ?ciprofloxacin (CIPRO) 500 MG tablet Take 1 tablet (500 mg total) by mouth every 12 (twelve) hours for 7 days. 06/04/21 06/11/21 Yes Johnattan Strassman T, PA-C  ?oxyCODONE-acetaminophen (PERCOCET/ROXICET) 5-325 MG tablet Take 1 tablet by mouth every 6 (six) hours as needed for severe pain. 06/04/21  Yes Evie Croston T, PA-C  ?albuterol (VENTOLIN HFA) 108 (90 Base) MCG/ACT inhaler Inhale 2 puffs into the lungs every 6 (six) hours as needed for wheezing or shortness of breath. 06/05/19   [provider]  ?budesonide-formoterol (SYMBICORT) 80-4.5 MCG/ACT inhaler Inhale 2 puffs into the lungs daily. 06/05/19   [provider]  ?Cholecalciferol 1.25 MG (50000 UT) capsule Take 1 po twice weekly ?Patient taking differently: Take 50,000 Units by mouth 2 (two) times a week. Tuesdays & Thursdays 04/12/21   [name], PA-C  ?docusate sodium (COLACE) 100 MG capsule Take 1 capsule (100 mg total) by mouth 2 (two) times daily. ?Patient taking differently: Take 100 mg by  mouth 2 (two) times daily as needed (constipation.). 03/15/21   Debbrah Alar, PA-C  ?gabapentin (NEURONTIN) 100 MG capsule Take 100 mg by mouth daily as needed (pain). 05/06/20   [provider]  ?LORazepam (ATIVAN) 1 MG tablet Take 1 tablet (1 mg total) by mouth daily as needed. 03/11/21   [name], PA-C  ?omeprazole (PRILOSEC) 20 MG capsule Take twice daily while taking prednisone ?Patient taking differently: Take 20 mg by mouth in the morning. 11/26/20   Rip Harbour, NP  ?ondansetron (ZOFRAN-ODT) 4 MG disintegrating tablet Take 1 tablet (4 mg total) by mouth every 8 (eight) hours as needed for nausea or vomiting. 02/12/21   [name], PA-C  ?Semaglutide, 1 MG/DOSE, (OZEMPIC, 1 MG/DOSE,) 4 MG/3ML SOPN Inject 1 mg into the skin once a week. ?Patient taking differently: Inject 1 mg into the skin every Tuesday. 03/11/21   [name], PA-C  ?   ? ?Allergies    ?Patient has no known allergies.   ? ?Review of Systems   ?Review of Systems  ?Constitutional:  Positive for fever.  ?Genitourinary:  Positive for flank pain.  ?All other systems reviewed and are negative. ? ?Physical Exam ?Updated Vital Signs ?BP 122/81   Pulse 89   Temp 98.3 ?F (36.8 ?C) (Oral)   Resp 18   LMP 06/03/2021 Comment: negative HCG blood test 06-04-2021  SpO2 100%  ?Physical Exam ?Vitals and nursing note reviewed.  ?Constitutional:   ?   Appearance: Normal appearance.  ?HENT:  ?   Head:  Normocephalic and atraumatic.  ?Eyes:  ?   Conjunctiva/sclera: Conjunctivae normal.  ?Cardiovascular:  ?   Rate and Rhythm: Normal rate and regular rhythm.  ?Pulmonary:  ?   Effort: Pulmonary effort is normal. No respiratory distress.  ?   Breath sounds: Normal breath sounds.  ?Abdominal:  ?   General: There is no distension.  ?   Palpations: Abdomen is soft.  ?   Tenderness: There is no abdominal tenderness.  ?   Comments: Right nephrostomy tube in place with significant tenderness to palpation and mild erythema. Yellow crusting around the tube  concerning for prior purulent drainage.   ?Skin: ?   General: Skin is warm and dry.  ?Neurological:  ?   General: No focal deficit present.  ?   Mental Status: She is alert.  ? ? ?ED Results / Procedures / Treatments   ?Labs ?(all labs ordered are listed, but only abnormal results are displayed) ?Labs Reviewed  ?COMPREHENSIVE METABOLIC PANEL - Abnormal; Notable for the following components:  ?    Result Value  ? Calcium 8.7 (*)   ? All other components within normal limits  ?CBC - Abnormal; Notable for the following components:  ? WBC 13.4 (*)   ? Hemoglobin 11.4 (*)   ? HCT 35.8 (*)   ? All other components within normal limits  ?URINALYSIS, ROUTINE W REFLEX MICROSCOPIC - Abnormal; Notable for the following components:  ? APPearance HAZY (*)   ? Hgb urine dipstick MODERATE (*)   ? Protein, ur 100 (*)   ? Nitrite POSITIVE (*)   ? Leukocytes,Ua LARGE (*)   ? RBC / HPF >50 (*)   ? WBC, UA >50 (*)   ? Bacteria, UA RARE (*)   ? All other components within normal limits  ?URINE CULTURE  ?I-STAT BETA HCG BLOOD, ED ([facility], [facility], [facility] ONLY)  ? ? ?EKG ?None ? ?Radiology ?CT ABDOMEN PELVIS W CONTRAST ? ?Result Date: 06/04/2021 ?CLINICAL DATA:  Concern for infection at site of nephrostomy tube EXAM: CT ABDOMEN AND PELVIS WITH CONTRAST TECHNIQUE: Multidetector CT imaging of the abdomen and pelvis was performed using the standard protocol following bolus administration of intravenous contrast. RADIATION DOSE REDUCTION: This exam was performed according to the departmental dose-optimization program which includes automated exposure control, adjustment of the mA and/or kV according to patient size and/or use of iterative reconstruction technique. CONTRAST:  134m OMNIPAQUE IOHEXOL 300 MG/ML  SOLN COMPARISON:  CT abdomen pelvis, 04/29/2021, MR abdomen, 04/25/2021 FINDINGS: Lower chest: No acute abnormality. 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 Status post cholecystectomy. No biliary dilatation. Pancreas: Unremarkable. No pancreatic ductal dilatation or surrounding inflammatory changes. Spleen: Normal in size without significant abnormality. Adrenals/Urinary Tract: Adrenal glands are unremarkable. Right-sided percutaneous nephrostomy tube, with formed pigtail in the right renal pelves. No residual hydronephrosis or hydroureter. No evidence of fluid collection or other soft tissue abnormality associated with the nephrostomy catheter or tract. Left kidney is normal, without renal calculi, solid lesion, or hydronephrosis. Bladder is unremarkable. Stomach/Bowel: Status post Roux type gastric bypass. Appendix appears normal. No evidence of bowel wall thickening, distention, or inflammatory changes. Vascular/Lymphatic: No significant vascular findings are present. No enlarged abdominal or pelvic lymph nodes. Reproductive: No mass or other significant abnormality. Other: No abdominal wall hernia or abnormality. No ascites. Musculoskeletal: No acute or significant osseous findings. IMPRESSION: 1. Right-sided percutaneous nephrostomy tube,  with formed pigtail in the right renal pelves. No residual hydronephrosis or hydroureter. 2. No evidence of fluid collection or other soft tissue abnormality associated with the nephrostomy catheter or tract. Electronically Signed   By: Delanna Ahmadi M.D.   On: 06/04/2021 09:37   ? ?Procedures ?Procedures  ? ? ?Medications Ordered in ED ?Medications  ?sodium chloride (PF) 0.9 % injection (has no administration in time range)  ?cefTRIAXone (ROCEPHIN) 1 g in sodium chloride 0.9 % 100 mL IVPB (1 g Intravenous New Bag/Given (Non-Interop) 06/04/21 1036)  ?fentaNYL (SUBLIMAZE) injection 50 mcg (50 mcg Intravenous Given 06/04/21 0755)  ?iohexol (OMNIPAQUE) 300 MG/ML solution 100 mL (100 mLs Intravenous Contrast Given 06/04/21 0914)  ? ? ?ED Course/ Medical Decision Making/ A&P ?  ?                         ?Medical Decision Making ?Amount and/or Complexity of Data Reviewed ?Labs: ordered. ?Radiology: ordered. ? ?Risk ?Prescription drug management. ? ? ?This patient presents to the ED for concern of flank

## 2021-06-04 NOTE — ED Triage Notes (Signed)
Patient c/o R flank pain at site of urostomy. Fever last night. ?

## 2021-06-04 NOTE — Discharge Instructions (Addendum)
You are seen emergency department today for flank pain. ? ?As we discussed you have pyelonephritis, or an infection of your right kidney.  The urologist recommended that we treat you with antibiotics, and have you follow-up in their clinic next week to discuss exchanging the tube. ? ?I have also prescribed you a different pain medication that you can take in place of the tramadol.  While taking this medication, do not take the tramadol.  ? ?If you have any issues over the weekend before you are able to follow-up with urology, return to the emergency department. ?

## 2021-06-06 LAB — URINE CULTURE: Culture: >=100000 — AB

## 2021-06-07 ENCOUNTER — Telehealth: Payer: Self-pay | Admitting: Emergency Medicine

## 2021-06-07 NOTE — Telephone Encounter (Signed)
Post ED Visit - Positive Culture Follow-up: Successful Patient Follow-Up ? ?Culture assessed and recommendations reviewed by: ? ?'[]'$  Elenor Quinones, Pharm.D. ?'[]'$  Heide Guile, Pharm.D., BCPS AQ-ID ?'[]'$  Parks Neptune, Pharm.D., BCPS ?'[]'$  Alycia Rossetti, Pharm.D., BCPS ?'[]'$  Maloy, Pharm.D., BCPS, AAHIVP ?'[]'$  Legrand Como, Pharm.D., BCPS, AAHIVP ?'[]'$  Salome Arnt, PharmD, BCPS ?'[]'$  Johnnette Gourd, PharmD, BCPS ?'[]'$  Hughes Better, PharmD, BCPS ?'[]'$  Leeroy Cha, PharmD ?[name] PharmD ? ?Positive urine culture ? ?'[]'$  Patient discharged without antimicrobial prescription and treatment is now indicated ?'[x]'$  Organism is resistant to prescribed ED discharge antimicrobial ?'[]'$  Patient with positive blood cultures ? ?Changes discussed with ED provider: Rexene Agent MD ?New antibiotic prescription stop ciprofloxacin, start Cefadroxil 1 gram po bid x 10 days ?Called to [facility] ? ?Contacted patient, date 06/07/2021, time 0935 ? ? ?Michelle Lamb ?06/07/2021, 9:40 AM ? ?  ?

## 2021-06-07 NOTE — Progress Notes (Signed)
ED Antimicrobial Stewardship Positive Culture Follow Up  ? ?Michelle Lamb is an 31 y.o. female who presented to [facility] on 06/04/2021 with a chief complaint of  ?Chief Complaint  ?Patient presents with  ? Flank Pain  ? ? ?Recent Results (from the past 720 hour(s))  ?Urine Culture     Status: Abnormal  ? Collection Time: 06/04/21  8:08 AM  ? Specimen: Urine, Catheterized  ?Result Value Ref Range Status  ? Specimen Description   Final  ?  URINE, CATHETERIZED ?Performed at [facility], [place] [address] [place], [place] [address] ?  ? Special Requests   Final  ?  NONE ?Performed at [facility], [place] [address], [place], [place] [address] ?  ? Culture >=100,000 COLONIES/mL STAPHYLOCOCCUS AUREUS (A)  Final  ? Report Status 06/06/2021 FINAL  Final  ? Organism ID, Bacteria STAPHYLOCOCCUS AUREUS (A)  Final  ?    Susceptibility  ? Staphylococcus aureus - MIC*  ?  CIPROFLOXACIN <=0.5 SENSITIVE Sensitive   ?  GENTAMICIN <=0.5 SENSITIVE Sensitive   ?  NITROFURANTOIN <=16 SENSITIVE Sensitive   ?  OXACILLIN <=0.25 SENSITIVE Sensitive   ?  TETRACYCLINE <=1 SENSITIVE Sensitive   ?  VANCOMYCIN 1 SENSITIVE Sensitive   ?  TRIMETH/SULFA <=10 SENSITIVE Sensitive   ?  CLINDAMYCIN <=0.25 SENSITIVE Sensitive   ?  RIFAMPIN <=0.5 SENSITIVE Sensitive   ?  Inducible Clindamycin NEGATIVE Sensitive   ?  * >=100,000 COLONIES/mL STAPHYLOCOCCUS AUREUS  ? ? ?'[x]'$  Treated with ciprofloxacin, organism resistant to prescribed antimicrobial ?'[]'$  Patient discharged originally without antimicrobial agent and treatment is now indicated ? ?New antibiotic prescription: Stop ciprofloxacin. Start cefadroxil 1 gm BID X 10 days ? ?ED Provider: Teressa Lower, MD  ? ? ?[name], PharmD, BCPS, BCIDP ?Infectious Diseases Clinical Pharmacist ?Phone: [phone] ?06/07/2021, 8:54 AM ? ? ?

## 2021-06-08 ENCOUNTER — Other Ambulatory Visit: Payer: Self-pay

## 2021-06-08 ENCOUNTER — Telehealth: Payer: Self-pay | Admitting: Emergency Medicine

## 2021-06-08 ENCOUNTER — Other Ambulatory Visit (HOSPITAL_COMMUNITY): Payer: Self-pay

## 2021-06-08 MED ORDER — OZEMPIC (0.25 OR 0.5 MG/DOSE) 2 MG/3ML ~~LOC~~ SOPN
0.5000 mg | PEN_INJECTOR | SUBCUTANEOUS | 0 refills | Status: DC
Start: 2021-06-08 — End: 2021-09-27
  Filled 2021-06-08: qty 3, 28d supply, fill #0
  Filled 2021-07-27: qty 3, 28d supply, fill #1

## 2021-06-08 NOTE — Telephone Encounter (Signed)
Rx changed to Cephalexin '500mg'$  po q 6 hours x 10 days , changed due to cost, new rx called to [facility] @ [phone] ?

## 2021-06-10 ENCOUNTER — Other Ambulatory Visit (HOSPITAL_COMMUNITY): Payer: Self-pay

## 2021-06-25 ENCOUNTER — Other Ambulatory Visit: Payer: Self-pay | Admitting: Physician Assistant

## 2021-06-25 MED ORDER — OXYCODONE-ACETAMINOPHEN 5-325 MG PO TABS: 1.0000 | ORAL_TABLET | Freq: Four times a day (QID) | ORAL | 0 refills | Status: DC | PRN

## 2021-06-30 ENCOUNTER — Other Ambulatory Visit (HOSPITAL_COMMUNITY): Payer: Self-pay

## 2021-07-02 ENCOUNTER — Other Ambulatory Visit (HOSPITAL_COMMUNITY): Payer: No Typology Code available for payment source

## 2021-07-06 NOTE — Patient Instructions (Addendum)
DUE TO COVID-19 ONLY TWO VISITORS  (aged 33 and older)  ARE ALLOWED TO COME WITH YOU AND STAY IN THE WAITING ROOM ONLY DURING PRE OP AND PROCEDURE.   ?**NO VISITORS ARE ALLOWED IN THE SHORT STAY AREA OR RECOVERY ROOM!!** ? ?IF YOU WILL BE ADMITTED INTO THE HOSPITAL YOU ARE ALLOWED ONLY FOUR SUPPORT PEOPLE DURING VISITATION HOURS ONLY (7 AM -8PM)   ?The support person(s) must pass our screening, gel in and out, and wear a mask at all times, including in the patient?s room. ?Patients must also wear a mask when staff or their support person are in the room. ?Visitors GUEST BADGE MUST BE WORN VISIBLY  ?One adult visitor may remain with you overnight and MUST be in the room by 8 P.M. ?  ? ? Your procedure is scheduled on: 07/15/21 ? ? Report to [facility] Main Entrance ? ?  Report to short stay at : 5:15 AM ? ? Call this number if you have problems the morning of surgery [phone] ? ? Do not eat food :After Midnight. ? ? After Midnight you may have the following liquids until : 4:15 AM DAY OF SURGERY ? ?Water ?Black Coffee (sugar ok, NO MILK/CREAM OR CREAMERS)  ?Tea (sugar ok, NO MILK/CREAM OR CREAMERS) regular and decaf                             ?Plain Jell-O (NO RED)                                           ?Fruit ices (not with fruit pulp, NO RED)                                     ?Popsicles (NO RED)                                                                  ?Juice: apple, WHITE grape, WHITE cranberry ?Sports drinks like Gatorade (NO RED) ?Clear broth(vegetable,chicken,beef) ? ?FOLLOW BOWEL PREP AND ANY ADDITIONAL PRE OP INSTRUCTIONS YOU RECEIVED FROM YOUR SURGEON'S OFFICE!!! ?  ?Oral Hygiene is also important to reduce your risk of infection.                                    ?Remember - BRUSH YOUR TEETH THE MORNING OF SURGERY WITH YOUR REGULAR TOOTHPASTE ? ? Do NOT smoke after Midnight ? ? Take these medicines the morning of surgery with A SIP OF WATER: gabapentin,omeprazole.Use inhalers as  usual. ? ?DO NOT TAKE ANY ORAL DIABETIC MEDICATIONS DAY OF YOUR SURGERY ? ?Bring CPAP mask and tubing day of surgery. ?                  ?           You may not have any metal on your body including hair pins, jewelry, and body piercing ? ?  Do not wear make-up, lotions, powders, perfumes/cologne, or deodorant ? ?Do not wear nail polish including gel and S&S, artificial/acrylic nails, or any other type of covering on natural nails including finger and toenails. If you have artificial nails, gel coating, etc. that needs to be removed by a nail salon please have this removed prior to surgery or surgery may need to be canceled/ delayed if the surgeon/ anesthesia feels like they are unable to be safely monitored.  ? ?Do not shave  48 hours prior to surgery.  ? ? Do not bring valuables to the hospital. [facility] NOT ?            RESPONSIBLE   FOR VALUABLES. ? ? Contacts, dentures or bridgework may not be worn into surgery. ? ? Bring small overnight bag day of surgery. ?  ? Patients discharged on the day of surgery will not be allowed to drive home.  Someone NEEDS to stay with you for the first 24 hours after anesthesia. ? ? Special Instructions: Bring a copy of your healthcare power of attorney and living will documents         the day of surgery if you haven't scanned them before. ? ?            Please read over the following fact sheets you were given: IF [place] [phone] ? ?   [facility] - Preparing for Surgery ?Before surgery, you can play an important role.  Because skin is not sterile, your skin needs to be as free of germs as possible.  You can reduce the number of germs on your skin by washing with CHG (chlorahexidine gluconate) soap before surgery.  CHG is an antiseptic cleaner which kills germs and bonds with the skin to continue killing germs even after washing. ?Please DO NOT use if you have an allergy to CHG or antibacterial soaps.  If  your skin becomes reddened/irritated stop using the CHG and inform your nurse when you arrive at Short Stay. ?Do not shave (including legs and underarms) for at least 48 hours prior to the first CHG shower.  You may shave your face/neck. ?Please follow these instructions carefully: ? 1.  Shower with CHG Soap the night before surgery and the  morning of Surgery. ? 2.  If you choose to wash your hair, wash your hair first as usual with your  normal  shampoo. ? 3.  After you shampoo, rinse your hair and body thoroughly to remove the  shampoo.                           4.  Use CHG as you would any other liquid soap.  You can apply chg directly  to the skin and wash  ?                     Gently with a scrungie or clean washcloth. ? 5.  Apply the CHG Soap to your body ONLY FROM THE NECK DOWN.   Do not use on face/ open      ?                     Wound or open sores. Avoid contact with eyes, ears mouth and genitals (private parts).  ?  Wash face,  Genitals (private parts) with your normal soap. ?            6.  Wash thoroughly, paying special attention to the area where your surgery  will be performed. ? 7.  Thoroughly rinse your body with warm water from the neck down. ? 8.  DO NOT shower/wash with your normal soap after using and rinsing off  the CHG Soap. ?               9.  Pat yourself dry with a clean towel. ?           10.  Wear clean pajamas. ?           11.  Place clean sheets on your bed the night of your first shower and do not  sleep with pets. ?Day of Surgery : ?Do not apply any lotions/deodorants the morning of surgery.  Please wear clean clothes to the hospital/surgery center. ? ?FAILURE TO FOLLOW THESE INSTRUCTIONS MAY RESULT IN THE CANCELLATION OF YOUR SURGERY ?PATIENT SIGNATURE_________________________________ ? ?NURSE SIGNATURE__________________________________ ? ?________________________________________________________________________  ? ?WHAT IS A BLOOD TRANSFUSION? Blood  Transfusion Information ? ?A transfusion is the replacement of blood or some of its parts. Blood is made up of multiple cells which provide different functions. ?Red blood cells carry oxygen and are used for blood loss replacement. ?White blood cells fight against infection. ?Platelets control bleeding. ?Plasma helps clot blood. ?Other blood products are available for specialized needs, such as hemophilia or other clotting disorders. ?BEFORE THE TRANSFUSION  ?Who gives blood for transfusions?  ?Healthy volunteers who are fully evaluated to make sure their blood is safe. This is blood bank blood. ?Transfusion therapy is the safest it has ever been in the practice of medicine. Before blood is taken from a donor, a complete history is taken to make sure that person has no history of diseases nor engages in risky social behavior (examples are intravenous drug use or sexual activity with multiple partners). The donor's travel history is screened to minimize risk of transmitting infections, such as malaria. The donated blood is tested for signs of infectious diseases, such as HIV and hepatitis. The blood is then tested to be sure it is compatible with you in order to minimize the chance of a transfusion reaction. If you or a relative donates blood, this is often done in anticipation of surgery and is not appropriate for emergency situations. It takes many days to process the donated blood. ?RISKS AND COMPLICATIONS ?Although transfusion therapy is very safe and saves many lives, the main dangers of transfusion include:  ?Getting an infectious disease. ?Developing a transfusion reaction. This is an allergic reaction to something in the blood you were given. Every precaution is taken to prevent this. ?The decision to have a blood transfusion has been considered carefully by your caregiver before blood is given. Blood is not given unless the benefits outweigh the risks. ?AFTER THE TRANSFUSION ?Right after receiving a blood  transfusion, you will usually feel much better and more energetic. This is especially true if your red blood cells have gotten low (anemic). The transfusion raises the level of the red blood cells which carry oxyg

## 2021-07-07 ENCOUNTER — Encounter (HOSPITAL_COMMUNITY)
Admission: RE | Admit: 2021-07-07 | Discharge: 2021-07-07 | Disposition: A | Discharge status: Home / Self Care | Payer: No Typology Code available for payment source | Source: Ambulatory Visit | Attending: Urology | Admitting: Urology

## 2021-07-07 ENCOUNTER — Encounter (HOSPITAL_COMMUNITY): Payer: Self-pay

## 2021-07-07 ENCOUNTER — Other Ambulatory Visit: Payer: Self-pay

## 2021-07-07 VITALS — BP 138/81 | HR 71 | Temp 97.8°F | Ht 63.0 in

## 2021-07-07 DIAGNOSIS — E1169 Type 2 diabetes mellitus with other specified complication: Secondary | ICD-10-CM | POA: Diagnosis not present

## 2021-07-07 DIAGNOSIS — Z01812 Encounter for preprocedural laboratory examination: Secondary | ICD-10-CM | POA: Diagnosis present

## 2021-07-07 DIAGNOSIS — E669 Obesity, unspecified: Secondary | ICD-10-CM | POA: Insufficient documentation

## 2021-07-07 LAB — CBC
HCT: 34.7 % — ABNORMAL LOW (ref 36.0–46.0)
Hemoglobin: 11.1 g/dL — ABNORMAL LOW (ref 12.0–15.0)
MCH: 26.9 pg (ref 26.0–34.0)
MCHC: 32 g/dL (ref 30.0–36.0)
MCV: 84 fL (ref 80.0–100.0)
Platelets: 368 10*3/uL (ref 150–400)
RBC: 4.13 MIL/uL (ref 3.87–5.11)
RDW: 15 % (ref 11.5–15.5)
WBC: 9.3 10*3/uL (ref 4.0–10.5)
nRBC: 0 % (ref 0.0–0.2)

## 2021-07-07 LAB — BASIC METABOLIC PANEL
Anion gap: 3 — ABNORMAL LOW (ref 5–15)
BUN: 11 mg/dL (ref 6–20)
CO2: 28 mmol/L (ref 22–32)
Calcium: 8.8 mg/dL — ABNORMAL LOW (ref 8.9–10.3)
Chloride: 108 mmol/L (ref 98–111)
Creatinine, Ser: 0.43 mg/dL — ABNORMAL LOW (ref 0.44–1.00)
GFR, Estimated: >60 mL/min (ref >60)
Glucose, Bld: 78 mg/dL (ref 70–99)
Potassium: 4.1 mmol/L (ref 3.5–5.1)
Sodium: 139 mmol/L (ref 135–145)

## 2021-07-07 LAB — GLUCOSE, CAPILLARY: Glucose-Capillary: 84 mg/dL (ref 70–99)

## 2021-07-07 LAB — HEMOGLOBIN A1C
Hgb A1c MFr Bld: 5.1 % (ref 4.8–5.6)
Mean Plasma Glucose: 99.67 mg/dL

## 2021-07-07 NOTE — Progress Notes (Signed)
For Short Stay: ?[facility] appointment date: ?Date of COVID positive in last 90 days: ? ?Bowel Prep reminder: ? ? ?For Anesthesia: ?PCP - [name]: PA-C ?Cardiologist -  ? ?Chest x-ray -  ?EKG - 09/30/20 ?Stress Test -  ?ECHO -  ?Cardiac Cath -  ?Pacemaker/ICD device last checked: ?Pacemaker orders received: ?Device Rep notified: ? ?Spinal Cord Stimulator: ? ?Sleep Study - Yes ?CPAP - NO ? ?Fasting Blood Sugar - N/A ?Checks Blood Sugar ___0__ times a day ?Date and result of last Hgb A1c- ? ?Blood Thinner Instructions: ?Aspirin Instructions: ?Last Dose: ? ?Activity level: Can go up a flight of stairs and activities of daily living without stopping and without chest pain and/or shortness of breath ?  Able to exercise without chest pain and/or shortness of breath ?  Unable to go up a flight of stairs without chest pain and/or shortness of breath ?   ? ?Anesthesia review: Hx: HTN<DIA<OSA(NO CPAP) ? ?Patient denies shortness of breath, fever, cough and chest pain at PAT appointment ? ? ?Patient verbalized understanding of instructions that were given to them at the PAT appointment. Patient was also instructed that they will need to review over the PAT instructions again at home before surgery.  ?

## 2021-07-14 ENCOUNTER — Encounter (HOSPITAL_COMMUNITY): Payer: Self-pay | Admitting: Urology

## 2021-07-14 NOTE — H&P (Signed)
? ? ?Office Visit Report     07/05/2021  ? ?-------------------------------------------------------------------------------- ?  ?Michelle Lamb  ?MRN: [id number]  ?DOB: 14-Sep-1990, 31 year old Female  ?SSN:   ? PRIMARY CARE:  Everlean Alstrom. [place], [place]  ?REFERRING:  Luvenia Redden  ?PROVIDER:  Rexene Alberts, M.D.  ?TREATING:  Daine Gravel, NP  ?LOCATION:  [facility] [phone]  ?  ? ?-------------------------------------------------------------------------------- ?  ?CC/HPI: Right ureteral obstruction  ? ?Michelle Lamb returns today for further evaluation. 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 Postoperatively, she was noted to have right hydronephrosis and further evaluation with both retrograde pyelography and nephrostomy tube placement and antegrade nephrostogram studies indicated what appears to be complete obstruction of the proximal ureter. She follows up today after having undergone a renogram to evaluate her differential renal function. Her nephrostomy tube has been draining well. She denies any fever. She continues to have significant pain symptoms around her nephrostomy site and upper right flank.  ? ?07/05/2021: 31 year old female who presents today for preoperative appointment prior to undergoing surgery on 07/15/2021. Her nephrostomy site has been causing her some discomfort especially with movement. She does report it is draining well although at times does notice some mucus within the catheter. She denies fevers and chills.  ? ?  ?ALLERGIES: No Known Drug Allergies ?  ? ?MEDICATIONS: Omeprazole 20 mg capsule,delayed release  ?Cyclobenzaprine Hcl 10 mg tablet  ?Lorazepam 1 mg tablet  ?Ondansetron Hcl 4 mg tablet  ?Oxycodone Hcl 5 mg capsule  ?Ozempic 0.25 mg or 0.5 mg dose (2 mg/1.5 ml) pen injector  ?Vitamin D2  ?  ? ?GU PSH: Cystoscopy Ureteroscopy - 05/03/2021 ?Locm 300-'399Mg'$ /Ml Iodine,1Ml - 04/29/2021 ?Partial nephrectomy (laparoscopic) -  03/15/2021 ? ?  ? ?NON-GU PSH: Gastric bypass - 12/29/2020 ?Remove Gallbladder - 2011 ? ?  ? ?GU PMH: Ureteral obstruction - 06/01/2021 ?Hydronephrosis - 05/25/2021, - 04/30/2021 ?Benign tumor right kidney - 04/29/2021, - 01/12/2021 ?Renal calculus - 01/12/2021 ?  ?   ?PMH Notes:  ? ?1) Right angiomyolipoma: She is s/p a right RAL partial nephrectomy on 03/15/21. 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  ? ?Baseline renal function: normal  ? ?NON-GU PMH: Benign lipomatous neoplasm of kidney - 04/13/2021, - 03/30/2021, - 03/05/2021, - 01/26/2021 ?Bacteriuria - 03/05/2021, - 01/26/2021 ?Anxiety ?Asthma ?Diabetes Type 2 ?GERD ?  ? ?FAMILY HISTORY: 1 son - Runs in Family ?3 daughters - Runs in Family ?Primary cancer of lesser curve of stomach - Runs in Family  ? ?SOCIAL HISTORY: Marital Status: Married ?Ethnicity: Not Hispanic Or Latino; Race: Black or African American ?Current Smoking Status: Patient has never smoked.  ? ?Tobacco Use Assessment Completed: Used Tobacco in last 30 days? ?Has never drank.  ?Drinks 2 caffeinated drinks per day. ?  ? ?REVIEW OF SYSTEMS:    ?GU Review Female:   Patient denies frequent urination, hard to postpone urination, burning /pain with urination, get up at night to urinate, leakage of urine, stream starts and stops, trouble starting your stream, have to strain to urinate, and being pregnant.  ?Gastrointestinal (Upper):   Patient denies nausea, vomiting, and indigestion/ heartburn.  ?Gastrointestinal (Lower):   Patient denies diarrhea and constipation.  ?Constitutional:   Patient denies fever, night sweats, weight loss, and fatigue.  ?Musculoskeletal:   Patient reports back pain. Patient denies joint pain.  ?Neurological:   Patient denies headaches and dizziness.  ?Psychologic:   Patient denies depression and anxiety.  ? ?  Notes: Blood in urine ?  ? ?VITAL SIGNS:    ?  07/05/2021 12:58 PM  ?BP 118/80 mmHg  ?Pulse 81 /min  ?Temperature 97.3 F / 36.2 C  ? ?GU PHYSICAL EXAMINATION:     ? ?Notes: Right nephrostomy  tube is draining clear pale yellow urine. Site is without erythema, purulent drainage, and edema.  ? ?MULTI-SYSTEM PHYSICAL EXAMINATION:    ?Constitutional: Well-nourished. No physical deformities. Normally developed. Good grooming.  ?Respiratory: Normal breath sounds. No labored breathing, no use of accessory muscles.   ?Cardiovascular: Regular rate and rhythm. No murmur, no gallop. Normal temperature, normal extremity pulses, no swelling, no varicosities.   ?Skin: No paleness, no jaundice, no cyanosis. No lesion, no ulcer, no rash.  ?Neurologic / Psychiatric: Oriented to time, oriented to place, oriented to person. No depression, no anxiety, no agitation.  ?Gastrointestinal: No mass, no tenderness, no rigidity, non obese abdomen.  ? ?  ?Complexity of Data:  ?Source Of History:  Patient  ?Records Review:   Previous Doctor Records, Previous Patient Records  ?Urine Test Review:   Urinalysis  ? 07/05/21  ?Urinalysis  ?Urine Appearance Clear   ?Urine Color Amber   ?Urine Glucose Neg mg/dL  ?Urine Bilirubin Neg mg/dL  ?Urine Ketones Neg mg/dL  ?Urine Specific Gravity 1.030   ?Urine Blood Neg ery/uL  ?Urine pH 6.0   ?Urine Protein Trace mg/dL  ?Urine Urobilinogen 1.0 mg/dL  ?Urine Nitrites Neg   ?Urine Leukocyte Esterase Neg leu/uL  ? ?PROCEDURES:    ? ?     Urinalysis ?Dipstick Dipstick Cont'd  ?Color: Amber Bilirubin: Neg mg/dL  ?Appearance: Clear Ketones: Neg mg/dL  ?Specific Gravity: 1.030 Blood: Neg ery/uL  ?pH: 6.0 Protein: Trace mg/dL  ?Glucose: Neg mg/dL Urobilinogen: 1.0 mg/dL  ?  Nitrites: Neg  ?  Leukocyte Esterase: Neg leu/uL  ? ? ?ASSESSMENT:  ?    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 ?PLAN:    ? ? ?      Medications ?Stop Meds: Tramadol Hcl 50 mg tablet 1-2 tablet PO Q 6 H PRN  Start: 06/01/2021  ?Discontinue: 07/05/2021  - Reason: The medication cycle was completed.  ?  ? ? ?      Orders ?Labs CULTURE, URINE  ?Lab Notes: neph tube site  ? ? ?      Document ?Letter(s):   Created for Patient: Clinical Summary  ? ? ?     Notes:   Culture was taken from right nephrostomy tube. All of her questions were answered to the best of my ability. She understands to notify the clinic with any changes to her health history, or if she develops fevers, chills or any other concerns prior to surgery.   ? ?     Next Appointment:    ?  Next Appointment: 07/15/2021 07:15 AM  ?  Appointment Type: Surgery   ?  Location: [facility] [phone]  ?  Provider: Raynelle Bring, M.D.  ?  Reason for Visit: WL/IP RT RA LAP URETEROURETEROSTOMY VS PEYLOPLSTY(POS OPN) CYSTO, RPG, [facility]  ?  ? ? ?* Signed by Daine Gravel, NP on 07/05/21 at 1:44 PM (EDT* ?    ?

## 2021-07-14 NOTE — Anesthesia Preprocedure Evaluation (Addendum)
Anesthesia Evaluation  Patient identified by MRN, date of birth, ID band Patient awake    Reviewed: Allergy & Precautions, NPO status , Patient's Chart, lab work & pertinent test results, reviewed documented beta blocker date and time   History of Anesthesia Complications (+) PROLONGED EMERGENCE and history of anesthetic complications  Airway Mallampati: III  TM Distance: >3 FB Neck ROM: Full    Dental no notable dental hx. (+) Teeth Intact, Dental Advisory Given   Pulmonary asthma , sleep apnea and Continuous Positive Airway Pressure Ventilation ,    Pulmonary exam normal breath sounds clear to auscultation       Cardiovascular hypertension, Normal cardiovascular exam Rhythm:Regular Rate:Normal     Neuro/Psych PSYCHIATRIC DISORDERS Anxiety Depression negative neurological ROS     GI/Hepatic Neg liver ROS, GERD  Medicated,Hx/o gastric bypass   Endo/Other  diabetes, Well Controlled, Type 2Hyperlipidemia Obesity  Renal/GU Renal InsufficiencyRenal diseaseRight ureteral obstruction Hx/o renal calculi Right percutaneoous nephrostomy   negative genitourinary   Musculoskeletal negative musculoskeletal ROS (+)   Abdominal (+) + obese,   Peds  Hematology negative hematology ROS (+)   Anesthesia Other Findings   Reproductive/Obstetrics                           Anesthesia Physical Anesthesia Plan  ASA: 2  Anesthesia Plan: General   Post-op Pain Management: Tylenol PO (pre-op)*, Ketamine IV*, Precedex, Dilaudid IV and Toradol IV (intra-op)*   Induction: Intravenous and Cricoid pressure planned  PONV Risk Score and Plan: 4 or greater and Treatment may vary due to age or medical condition, Midazolam, Scopolamine patch - Pre-op, Dexamethasone and Ondansetron  Airway Management Planned: Oral ETT  Additional Equipment: None  Intra-op Plan:   Post-operative Plan: Extubation in OR  Informed  Consent: I have reviewed the patients History and Physical, chart, labs and discussed the procedure including the risks, benefits and alternatives for the proposed anesthesia with the patient or authorized representative who has indicated his/her understanding and acceptance.     Dental advisory given  Plan Discussed with: CRNA and Anesthesiologist  Anesthesia Plan Comments:        Anesthesia Quick Evaluation

## 2021-07-15 ENCOUNTER — Inpatient Hospital Stay (HOSPITAL_COMMUNITY)
Admission: RE | Admit: 2021-07-15 | Discharge: 2021-07-18 | DRG: 908 | Disposition: A | Discharge status: Home / Self Care | Payer: No Typology Code available for payment source | Source: Ambulatory Visit | Attending: Urology | Admitting: Urology

## 2021-07-15 ENCOUNTER — Encounter (HOSPITAL_COMMUNITY): Payer: Self-pay | Admitting: Urology

## 2021-07-15 ENCOUNTER — Inpatient Hospital Stay (HOSPITAL_COMMUNITY): Payer: No Typology Code available for payment source

## 2021-07-15 ENCOUNTER — Inpatient Hospital Stay (HOSPITAL_COMMUNITY): Payer: No Typology Code available for payment source | Admitting: Certified Registered"

## 2021-07-15 ENCOUNTER — Other Ambulatory Visit (HOSPITAL_COMMUNITY): Payer: Self-pay

## 2021-07-15 ENCOUNTER — Encounter (HOSPITAL_COMMUNITY)
Admission: RE | Disposition: A | Discharge status: Home / Self Care | Payer: Self-pay | Source: Ambulatory Visit | Attending: Urology

## 2021-07-15 ENCOUNTER — Other Ambulatory Visit: Payer: Self-pay

## 2021-07-15 DIAGNOSIS — I959 Hypotension, unspecified: Secondary | ICD-10-CM | POA: Diagnosis present

## 2021-07-15 DIAGNOSIS — J45909 Unspecified asthma, uncomplicated: Secondary | ICD-10-CM | POA: Diagnosis present

## 2021-07-15 DIAGNOSIS — T85898A Other specified complication of other internal prosthetic devices, implants and grafts, initial encounter: Secondary | ICD-10-CM | POA: Diagnosis present

## 2021-07-15 DIAGNOSIS — Z7951 Long term (current) use of inhaled steroids: Secondary | ICD-10-CM | POA: Diagnosis not present

## 2021-07-15 DIAGNOSIS — E119 Type 2 diabetes mellitus without complications: Secondary | ICD-10-CM | POA: Diagnosis present

## 2021-07-15 DIAGNOSIS — N138 Other obstructive and reflux uropathy: Secondary | ICD-10-CM | POA: Diagnosis present

## 2021-07-15 DIAGNOSIS — Y733 Surgical instruments, materials and gastroenterology and urology devices (including sutures) associated with adverse incidents: Secondary | ICD-10-CM | POA: Diagnosis present

## 2021-07-15 DIAGNOSIS — Z936 Other artificial openings of urinary tract status: Secondary | ICD-10-CM | POA: Diagnosis not present

## 2021-07-15 DIAGNOSIS — N135 Crossing vessel and stricture of ureter without hydronephrosis: Secondary | ICD-10-CM

## 2021-07-15 DIAGNOSIS — Z905 Acquired absence of kidney: Secondary | ICD-10-CM

## 2021-07-15 DIAGNOSIS — R710 Precipitous drop in hematocrit: Secondary | ICD-10-CM | POA: Diagnosis present

## 2021-07-15 HISTORY — DX: Crossing vessel and stricture of ureter without hydronephrosis: N13.5

## 2021-07-15 HISTORY — PX: CYSTOSCOPY W/ URETERAL STENT PLACEMENT: SHX1429

## 2021-07-15 HISTORY — PX: ROBOT ASSISTED PYELOPLASTY: SHX5143

## 2021-07-15 LAB — BASIC METABOLIC PANEL
Anion gap: 9 (ref 5–15)
BUN: 14 mg/dL (ref 6–20)
CO2: 20 mmol/L — ABNORMAL LOW (ref 22–32)
Calcium: 8.9 mg/dL (ref 8.9–10.3)
Chloride: 106 mmol/L (ref 98–111)
Creatinine, Ser: 0.74 mg/dL (ref 0.44–1.00)
GFR, Estimated: >60 mL/min (ref >60)
Glucose, Bld: 138 mg/dL — ABNORMAL HIGH (ref 70–99)
Potassium: 4.4 mmol/L (ref 3.5–5.1)
Sodium: 135 mmol/L (ref 135–145)

## 2021-07-15 LAB — PREGNANCY, URINE: Preg Test, Ur: NEGATIVE

## 2021-07-15 LAB — GLUCOSE, CAPILLARY
Glucose-Capillary: 86 mg/dL (ref 70–99)
Glucose-Capillary: 107 mg/dL — ABNORMAL HIGH (ref 70–99)

## 2021-07-15 LAB — TYPE AND SCREEN
ABO/RH(D): O POS
Antibody Screen: NEGATIVE

## 2021-07-15 LAB — HEMOGLOBIN AND HEMATOCRIT, BLOOD
HCT: 39.6 % (ref 36.0–46.0)
Hemoglobin: 12 g/dL (ref 12.0–15.0)

## 2021-07-15 IMAGING — DX DG ABDOMEN 1V
1 series · 1 of 1 positions shown · non-contrast
Comparison: CT scan of [DATE].

CLINICAL DATA: Status post right ureteral stent placement.

EXAM:
ABDOMEN - 1 VIEW

[abdomen kub]
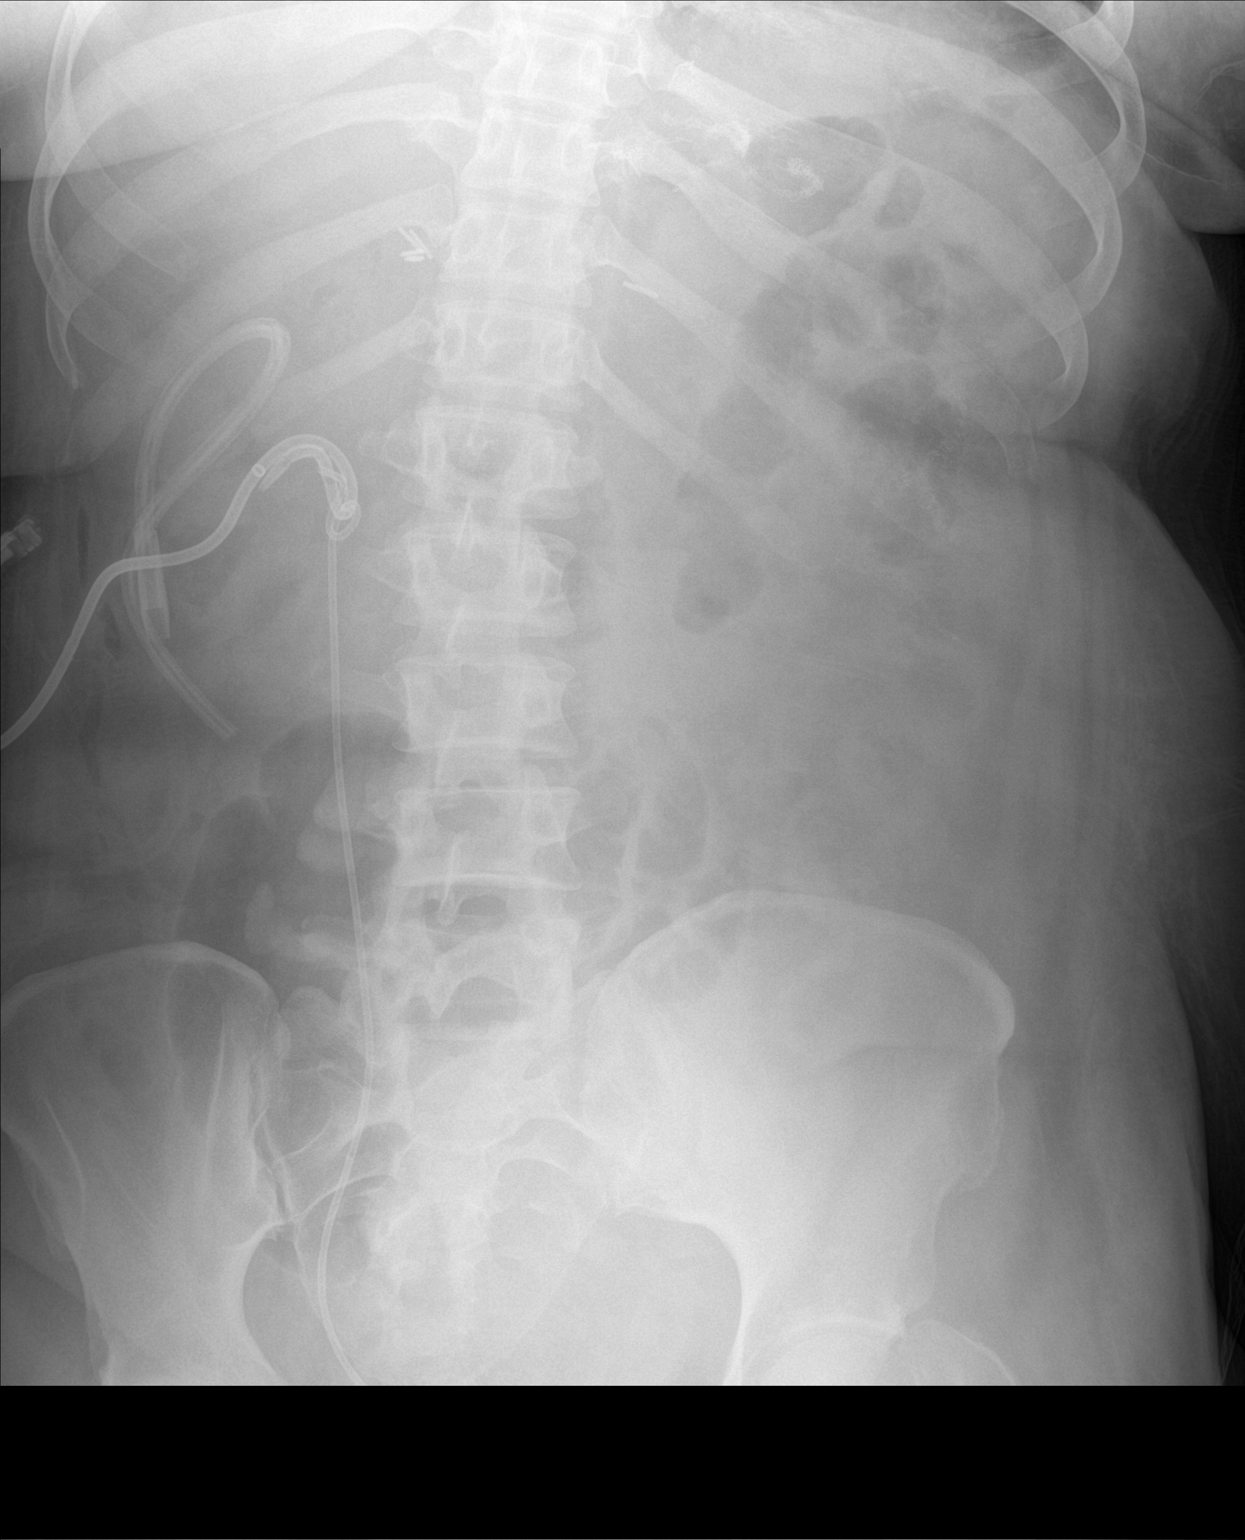

[1 of 1 positions shown; findings below may reference images not displayed]

FINDINGS: The bowel gas pattern is normal. Right nephrostomy catheter is noted
in expected position of right kidney. Right ureteral stent appears
to be in grossly good position extending from expected position of
right kidney too urinary bladder. There appears to be a surgical
drain in the right upper quadrant. Status post cholecystectomy.
IMPRESSION: No abnormal bowel dilatation is noted. Surgical drain is seen in
right upper quadrant. Right nephrostomy catheter and right-sided
ureteral stent are noted.

## 2021-07-15 SURGERY — PYELOPLASTY, ROBOT-ASSISTED
Anesthesia: General | Site: Vagina | Laterality: Right

## 2021-07-15 MED ORDER — DIPHENHYDRAMINE HCL 50 MG/ML IJ SOLN: 12.5000 mg | Freq: Four times a day (QID) | INTRAMUSCULAR | Status: DC | PRN

## 2021-07-15 MED ORDER — ROCURONIUM BROMIDE 10 MG/ML (PF) SYRINGE
PREFILLED_SYRINGE | INTRAVENOUS | Status: DC | PRN
  Administered 2021-07-15: 20 mg via INTRAVENOUS
  Administered 2021-07-15: 100 mg via INTRAVENOUS

## 2021-07-15 MED ORDER — CEFAZOLIN SODIUM-DEXTROSE 2-4 GM/100ML-% IV SOLN
2.0000 g | Freq: Once | INTRAVENOUS | Status: AC
  Administered 2021-07-15: 2 g via INTRAVENOUS
  Filled 2021-07-15: qty 100

## 2021-07-15 MED ORDER — FENTANYL CITRATE (PF) 100 MCG/2ML IJ SOLN
INTRAMUSCULAR | Status: AC
  Filled 2021-07-15: qty 2

## 2021-07-15 MED ORDER — PROPOFOL 10 MG/ML IV BOLUS
INTRAVENOUS | Status: DC | PRN
  Administered 2021-07-15: 150 mg via INTRAVENOUS

## 2021-07-15 MED ORDER — PHENYLEPHRINE HCL (PRESSORS) 10 MG/ML IV SOLN
INTRAVENOUS | Status: DC | PRN
  Administered 2021-07-15: 80 ug via INTRAVENOUS
  Administered 2021-07-15 (×3): 160 ug via INTRAVENOUS

## 2021-07-15 MED ORDER — SODIUM CHLORIDE 0.9 % IR SOLN
Status: DC | PRN
  Administered 2021-07-15: 3000 mL

## 2021-07-15 MED ORDER — PROPOFOL 10 MG/ML IV BOLUS
INTRAVENOUS | Status: AC
  Filled 2021-07-15: qty 20

## 2021-07-15 MED ORDER — MOMETASONE FURO-FORMOTEROL FUM 100-5 MCG/ACT IN AERO
2.0000 | INHALATION_SPRAY | Freq: Two times a day (BID) | RESPIRATORY_TRACT | Status: DC
  Administered 2021-07-15 – 2021-07-18 (×6): 2 via RESPIRATORY_TRACT
  Filled 2021-07-15 (×2): qty 8.8

## 2021-07-15 MED ORDER — BUPIVACAINE-EPINEPHRINE 0.25% -1:200000 IJ SOLN
INTRAMUSCULAR | Status: DC | PRN
  Administered 2021-07-15: 30 mL

## 2021-07-15 MED ORDER — PHENYLEPHRINE HCL-NACL 20-0.9 MG/250ML-% IV SOLN
INTRAVENOUS | Status: DC | PRN
  Administered 2021-07-15: 50 ug/min via INTRAVENOUS

## 2021-07-15 MED ORDER — LIDOCAINE HCL (CARDIAC) PF 100 MG/5ML IV SOSY
PREFILLED_SYRINGE | INTRAVENOUS | Status: DC | PRN
  Administered 2021-07-15: 100 mg via INTRAVENOUS

## 2021-07-15 MED ORDER — SCOPOLAMINE 1 MG/3DAYS TD PT72
1.0000 | MEDICATED_PATCH | TRANSDERMAL | Status: DC
  Administered 2021-07-15 – 2021-07-18 (×2): 1.5 mg via TRANSDERMAL
  Filled 2021-07-15: qty 1

## 2021-07-15 MED ORDER — ALBUTEROL SULFATE (2.5 MG/3ML) 0.083% IN NEBU: 3.0000 mL | INHALATION_SOLUTION | Freq: Four times a day (QID) | RESPIRATORY_TRACT | Status: DC | PRN

## 2021-07-15 MED ORDER — PANTOPRAZOLE SODIUM 40 MG PO TBEC
40.0000 mg | DELAYED_RELEASE_TABLET | Freq: Every day | ORAL | Status: DC
Start: 2021-07-15 — End: 2021-07-18
  Administered 2021-07-15 – 2021-07-18 (×4): 40 mg via ORAL
  Filled 2021-07-15 (×4): qty 1

## 2021-07-15 MED ORDER — MIDAZOLAM HCL 5 MG/5ML IJ SOLN
INTRAMUSCULAR | Status: DC | PRN
  Administered 2021-07-15: 2 mg via INTRAVENOUS

## 2021-07-15 MED ORDER — SUGAMMADEX SODIUM 200 MG/2ML IV SOLN
INTRAVENOUS | Status: DC | PRN
  Administered 2021-07-15: 200 mg via INTRAVENOUS

## 2021-07-15 MED ORDER — ONDANSETRON HCL 4 MG/2ML IJ SOLN: 4.0000 mg | INTRAMUSCULAR | Status: DC | PRN

## 2021-07-15 MED ORDER — HYDROMORPHONE HCL 1 MG/ML IJ SOLN
0.5000 mg | INTRAMUSCULAR | Status: DC | PRN
  Administered 2021-07-15 – 2021-07-16 (×3): 1 mg via INTRAVENOUS
  Filled 2021-07-15 (×3): qty 1

## 2021-07-15 MED ORDER — ORAL CARE MOUTH RINSE: 15.0000 mL | Freq: Once | OROMUCOSAL | Status: AC

## 2021-07-15 MED ORDER — LACTATED RINGERS IV SOLN: INTRAVENOUS | Status: DC | PRN

## 2021-07-15 MED ORDER — BUPIVACAINE-EPINEPHRINE (PF) 0.25% -1:200000 IJ SOLN
INTRAMUSCULAR | Status: AC
  Filled 2021-07-15: qty 30

## 2021-07-15 MED ORDER — HYDROMORPHONE HCL 1 MG/ML IJ SOLN
0.2500 mg | INTRAMUSCULAR | Status: DC | PRN
  Administered 2021-07-15 (×4): 0.5 mg via INTRAVENOUS

## 2021-07-15 MED ORDER — SCOPOLAMINE 1 MG/3DAYS TD PT72: 1.0000 | MEDICATED_PATCH | TRANSDERMAL | Status: DC

## 2021-07-15 MED ORDER — LACTATED RINGERS IV SOLN: INTRAVENOUS | Status: DC

## 2021-07-15 MED ORDER — KETAMINE HCL 50 MG/5ML IJ SOSY
PREFILLED_SYRINGE | INTRAMUSCULAR | Status: AC
  Filled 2021-07-15: qty 5

## 2021-07-15 MED ORDER — PHENYLEPHRINE HCL (PRESSORS) 10 MG/ML IV SOLN
INTRAVENOUS | Status: AC
  Filled 2021-07-15: qty 1

## 2021-07-15 MED ORDER — ACETAMINOPHEN 500 MG PO TABS
1000.0000 mg | ORAL_TABLET | Freq: Once | ORAL | Status: AC
  Administered 2021-07-15: 1000 mg via ORAL
  Filled 2021-07-15: qty 2

## 2021-07-15 MED ORDER — LACTATED RINGERS IR SOLN
Status: DC | PRN
  Administered 2021-07-15: 1000 mL

## 2021-07-15 MED ORDER — ONDANSETRON HCL 4 MG/2ML IJ SOLN
INTRAMUSCULAR | Status: DC | PRN
  Administered 2021-07-15: 4 mg via INTRAVENOUS

## 2021-07-15 MED ORDER — KCL IN DEXTROSE-NACL 20-5-0.45 MEQ/L-%-% IV SOLN
INTRAVENOUS | Status: DC
  Filled 2021-07-15 (×5): qty 1000

## 2021-07-15 MED ORDER — ONDANSETRON HCL 4 MG/2ML IJ SOLN: 4.0000 mg | Freq: Once | INTRAMUSCULAR | Status: DC | PRN

## 2021-07-15 MED ORDER — HYDROCODONE-ACETAMINOPHEN 5-325 MG PO TABS
1.0000 | ORAL_TABLET | Freq: Four times a day (QID) | ORAL | 0 refills | Status: DC | PRN
Start: 2021-07-15 — End: 2021-09-08
  Filled 2021-07-15: qty 20, 3d supply, fill #0

## 2021-07-15 MED ORDER — STERILE WATER FOR INJECTION IJ SOLN
INTRAMUSCULAR | Status: DC | PRN
  Administered 2021-07-15: 5 mL

## 2021-07-15 MED ORDER — CHLORHEXIDINE GLUCONATE 0.12 % MT SOLN
15.0000 mL | Freq: Once | OROMUCOSAL | Status: AC
  Administered 2021-07-15: 15 mL via OROMUCOSAL

## 2021-07-15 MED ORDER — WATER FOR IRRIGATION, STERILE IR SOLN
Status: DC | PRN
  Administered 2021-07-15: 1000 mL

## 2021-07-15 MED ORDER — DIPHENHYDRAMINE HCL 12.5 MG/5ML PO ELIX: 12.5000 mg | ORAL_SOLUTION | Freq: Four times a day (QID) | ORAL | Status: DC | PRN

## 2021-07-15 MED ORDER — MIDAZOLAM HCL 2 MG/2ML IJ SOLN
INTRAMUSCULAR | Status: AC
  Filled 2021-07-15: qty 2

## 2021-07-15 MED ORDER — CHOLECALCIFEROL 1.25 MG (50000 UT) PO CAPS: 50000.0000 [IU] | ORAL_CAPSULE | ORAL | Status: DC

## 2021-07-15 MED ORDER — HYDROMORPHONE HCL 1 MG/ML IJ SOLN
INTRAMUSCULAR | Status: AC
  Filled 2021-07-15: qty 1

## 2021-07-15 MED ORDER — CEFAZOLIN SODIUM-DEXTROSE 1-4 GM/50ML-% IV SOLN
1.0000 g | Freq: Three times a day (TID) | INTRAVENOUS | Status: AC
  Administered 2021-07-15 (×2): 1 g via INTRAVENOUS
  Filled 2021-07-15 (×2): qty 50

## 2021-07-15 MED ORDER — LORAZEPAM 1 MG PO TABS: 1.0000 mg | ORAL_TABLET | Freq: Every day | ORAL | Status: DC | PRN

## 2021-07-15 MED ORDER — ROCURONIUM BROMIDE 10 MG/ML (PF) SYRINGE
PREFILLED_SYRINGE | INTRAVENOUS | Status: AC
  Filled 2021-07-15: qty 10

## 2021-07-15 MED ORDER — 0.9 % SODIUM CHLORIDE (POUR BTL) OPTIME
TOPICAL | Status: DC | PRN
  Administered 2021-07-15: 1000 mL

## 2021-07-15 MED ORDER — KETOROLAC TROMETHAMINE 15 MG/ML IJ SOLN
15.0000 mg | Freq: Four times a day (QID) | INTRAMUSCULAR | Status: DC
  Administered 2021-07-15 – 2021-07-18 (×11): 15 mg via INTRAVENOUS
  Filled 2021-07-15 (×11): qty 1

## 2021-07-15 MED ORDER — DOCUSATE SODIUM 100 MG PO CAPS
100.0000 mg | ORAL_CAPSULE | Freq: Two times a day (BID) | ORAL | Status: DC
  Administered 2021-07-15 – 2021-07-18 (×6): 100 mg via ORAL
  Filled 2021-07-15 (×6): qty 1

## 2021-07-15 MED ORDER — GABAPENTIN 100 MG PO CAPS: 100.0000 mg | ORAL_CAPSULE | Freq: Every day | ORAL | Status: DC | PRN

## 2021-07-15 MED ORDER — GLYCOPYRROLATE 0.2 MG/ML IJ SOLN
INTRAMUSCULAR | Status: DC | PRN
  Administered 2021-07-15: .2 mg via INTRAVENOUS

## 2021-07-15 MED ORDER — VITAMIN D (ERGOCALCIFEROL) 1.25 MG (50000 UNIT) PO CAPS: 50000.0000 [IU] | ORAL_CAPSULE | ORAL | Status: DC

## 2021-07-15 MED ORDER — DEXAMETHASONE SODIUM PHOSPHATE 10 MG/ML IJ SOLN
INTRAMUSCULAR | Status: DC | PRN
  Administered 2021-07-15: 10 mg via INTRAVENOUS

## 2021-07-15 MED ORDER — KETAMINE HCL 10 MG/ML IJ SOLN
INTRAMUSCULAR | Status: DC | PRN
  Administered 2021-07-15: 40 mg via INTRAVENOUS

## 2021-07-15 MED ORDER — AMISULPRIDE (ANTIEMETIC) 5 MG/2ML IV SOLN: 10.0000 mg | Freq: Once | INTRAVENOUS | Status: DC | PRN

## 2021-07-15 MED ORDER — SCOPOLAMINE 1 MG/3DAYS TD PT72
MEDICATED_PATCH | TRANSDERMAL | Status: AC
  Filled 2021-07-15: qty 1

## 2021-07-15 MED ORDER — ACETAMINOPHEN 325 MG PO TABS
650.0000 mg | ORAL_TABLET | ORAL | Status: DC | PRN
  Administered 2021-07-18: 650 mg via ORAL
  Filled 2021-07-15: qty 2

## 2021-07-15 MED ORDER — HYDROCODONE-ACETAMINOPHEN 5-325 MG PO TABS
1.0000 | ORAL_TABLET | ORAL | Status: DC | PRN
  Administered 2021-07-16 – 2021-07-18 (×8): 2 via ORAL
  Filled 2021-07-15 (×8): qty 2

## 2021-07-15 MED ORDER — FENTANYL CITRATE (PF) 100 MCG/2ML IJ SOLN
INTRAMUSCULAR | Status: DC | PRN
  Administered 2021-07-15 (×5): 50 ug via INTRAVENOUS

## 2021-07-15 MED ORDER — IOHEXOL 300 MG/ML  SOLN
INTRAMUSCULAR | Status: DC | PRN
Start: 2021-07-15 — End: 2021-07-15
  Administered 2021-07-15: 27 mL

## 2021-07-15 SURGICAL SUPPLY — 69 items
ADAPTER GOLDBERG URETERAL (ADAPTER) ×1 IMPLANT
BAG COUNTER SPONGE SURGICOUNT (BAG) IMPLANT
BAG URO CATCHER STRL LF (MISCELLANEOUS) ×3 IMPLANT
CATH URETL OPEN END 6FR 70 (CATHETERS) ×1 IMPLANT
CHLORAPREP W/TINT 26 (MISCELLANEOUS) ×3 IMPLANT
CLIP LIGATING HEM O LOK PURPLE (MISCELLANEOUS) IMPLANT
CLIP LIGATING HEMO O LOK GREEN (MISCELLANEOUS) IMPLANT
CLOTH BEACON ORANGE TIMEOUT ST (SAFETY) ×3 IMPLANT
COVER SURGICAL LIGHT HANDLE (MISCELLANEOUS) ×3 IMPLANT
COVER TIP SHEARS 8 DVNC (MISCELLANEOUS) ×2 IMPLANT
COVER TIP SHEARS 8MM DA VINCI (MISCELLANEOUS) ×1
DERMABOND ADVANCED (GAUZE/BANDAGES/DRESSINGS) ×1
DERMABOND ADVANCED .7 DNX12 (GAUZE/BANDAGES/DRESSINGS) ×2 IMPLANT
DRAIN CHANNEL 15F RND FF 3/16 (WOUND CARE) ×3 IMPLANT
DRAPE ARM DVNC X/XI (DISPOSABLE) ×8 IMPLANT
DRAPE COLUMN DVNC XI (DISPOSABLE) ×2 IMPLANT
DRAPE DA VINCI XI ARM (DISPOSABLE) ×4
DRAPE DA VINCI XI COLUMN (DISPOSABLE) ×1
DRAPE INCISE IOBAN 66X45 STRL (DRAPES) ×3 IMPLANT
DRAPE LAPAROSCOPIC ABDOMINAL (DRAPES) ×3 IMPLANT
DRAPE SHEET LG 3/4 BI-LAMINATE (DRAPES) ×3 IMPLANT
DRSG TEGADERM 4X4.75 (GAUZE/BANDAGES/DRESSINGS) ×1 IMPLANT
ELECT PENCIL ROCKER SW 15FT (MISCELLANEOUS) ×1 IMPLANT
ELECT REM PT RETURN 15FT ADLT (MISCELLANEOUS) ×3 IMPLANT
EVACUATOR SILICONE 100CC (DRAIN) ×3 IMPLANT
GAUZE SPONGE 2X2 8PLY STRL LF (GAUZE/BANDAGES/DRESSINGS) IMPLANT
GLOVE BIO SURGEON STRL SZ 6.5 (GLOVE) ×2 IMPLANT
GLOVE SURG LX 7.5 STRW (GLOVE) ×3
GLOVE SURG LX STRL 7.5 STRW (GLOVE) ×6 IMPLANT
GOWN STRL REUS W/ TWL XL LVL3 (GOWN DISPOSABLE) ×2 IMPLANT
GOWN STRL REUS W/TWL XL LVL3 (GOWN DISPOSABLE) ×1
GUIDEWIRE STR DUAL SENSOR (WIRE) ×4 IMPLANT
GUIDEWIRE ZIPWRE .038 STRAIGHT (WIRE) IMPLANT
IRRIG SUCT STRYKERFLOW 2 WTIP (MISCELLANEOUS) ×3
IRRIGATION SUCT STRKRFLW 2 WTP (MISCELLANEOUS) IMPLANT
KIT BASIN OR (CUSTOM PROCEDURE TRAY) ×3 IMPLANT
KIT TURNOVER KIT A (KITS) IMPLANT
MANIFOLD NEPTUNE II (INSTRUMENTS) ×3 IMPLANT
NS IRRIG 1000ML POUR BTL (IV SOLUTION) ×3 IMPLANT
PACK CYSTO (CUSTOM PROCEDURE TRAY) ×3 IMPLANT
PENCIL SMOKE EVACUATOR (MISCELLANEOUS) IMPLANT
PROTECTOR NERVE ULNAR (MISCELLANEOUS) ×7 IMPLANT
SEAL CANN UNIV 5-8 DVNC XI (MISCELLANEOUS) ×8 IMPLANT
SEAL XI 5MM-8MM UNIVERSAL (MISCELLANEOUS) ×4
SET TUBE SMOKE EVAC HIGH FLOW (TUBING) ×3 IMPLANT
SOLUTION ELECTROLUBE (MISCELLANEOUS) ×3 IMPLANT
SPIKE FLUID TRANSFER (MISCELLANEOUS) ×3 IMPLANT
SPONGE GAUZE 2X2 STER 10/PKG (GAUZE/BANDAGES/DRESSINGS) ×1
STENT URET 6FRX26 CONTOUR (STENTS) ×1 IMPLANT
SUT ETHILON 3 0 PS 1 (SUTURE) ×3 IMPLANT
SUT MNCRL AB 4-0 PS2 18 (SUTURE) ×6 IMPLANT
SUT PDS PLUS 0 (SUTURE) ×2
SUT PDS PLUS AB 0 CT-2 (SUTURE) ×4 IMPLANT
SUT VIC AB 0 CT1 27 (SUTURE) ×3
SUT VIC AB 0 CT1 27XBRD ANTBC (SUTURE) ×6 IMPLANT
SUT VIC AB 0 UR5 27 (SUTURE) ×6 IMPLANT
SUT VIC AB 4-0 RB1 27 (SUTURE) ×6
SUT VIC AB 4-0 RB1 27XBRD (SUTURE) ×12 IMPLANT
SUT VICRYL 0 UR6 27IN ABS (SUTURE) ×6 IMPLANT
SYR BULB IRRIG 60ML STRL (SYRINGE) ×1 IMPLANT
TOWEL OR 17X26 10 PK STRL BLUE (TOWEL DISPOSABLE) ×3 IMPLANT
TOWEL OR NON WOVEN STRL DISP B (DISPOSABLE) ×6 IMPLANT
TRAY FOLEY MTR SLVR 16FR STAT (SET/KITS/TRAYS/PACK) ×3 IMPLANT
TRAY LAPAROSCOPIC (CUSTOM PROCEDURE TRAY) ×3 IMPLANT
TROCAR KII 12X100 BLADELESS (ENDOMECHANICALS) ×3 IMPLANT
TROCAR Z-THREAD FIOS 12X100MM (TROCAR) ×1 IMPLANT
TUBING CONNECTING 10 (TUBING) ×3 IMPLANT
TUBING UROLOGY SET (TUBING) IMPLANT
WATER STERILE IRR 1000ML POUR (IV SOLUTION) ×3 IMPLANT

## 2021-07-15 NOTE — Interval H&P Note (Signed)
History and Physical Interval Note:  07/15/2021 6:48 AM  Michelle Lamb  has presented today for surgery, with the diagnosis of RIGHT URETERAL OBSTRUCTION.  The various methods of treatment have been discussed with the patient and family. After consideration of risks, benefits and other options for treatment, the patient has consented to  Procedure(s): XI ROBOTIC ASSISTED  LAPAROSCOPIC URETEROURETERECTOMY VERSUS PYELOPLASTY , POSSIBLE OPEN (Right) CYSTOSCOPY WITH RETROGRADE PYELOGRAM/URETERAL STENT PLACEMENT (Right) as a surgical intervention.  The patient's history has been reviewed, patient examined, no change in status, stable for surgery.  I have reviewed the patient's chart and labs.  Questions were answered to the patient's satisfaction.     [employer]

## 2021-07-15 NOTE — Progress Notes (Signed)
Patient ID: Michelle Lamb, female   DOB: Dec 17, 1990, 31 y.o.   MRN: [id number]  Post-op note  Subjective: The patient is doing well.  No complaints.  Objective: Vital signs in last 24 hours: Temp:  [96.8 F (36 C)-98 F (36.7 C)] 97.8 F (36.6 C) (05/18 1354) Pulse Rate:  [52-86] 58 (05/18 1354) Resp:  [11-16] 16 (05/18 1354) BP: (104-132)/(63-78) 118/69 (05/18 1354) SpO2:  [97 %-100 %] 100 % (05/18 1354) Weight:  [82.6 kg] 82.6 kg (05/18 0625)  Intake/Output from previous day: No intake/output data recorded. Intake/Output this shift: Total I/O In: 1100 [I.V.:1100] Out: 305 [Urine:250; Drains:25; Blood:30]  Physical Exam:  General: Alert and oriented. Abdomen: Soft, Nondistended. Incisions: Clean and dry. GU: Urine clear, drain with minimal drainage  Lab Results: Recent Labs    07/15/21 1202  HGB 12.0  HCT 39.6    Assessment/Plan: POD#0   1) Continue to monitor, ambulate, IS 2) Will plan for Foley removal in the morning with plans to monitor drain overnight and check drain Cr in AM, tentatively scheduled for nephrostomy removal under fluoroscopy (so as not to dislodge ureteral stent) tomorrow if she does well and no sign of leak tomorrow   Roxy Horseman, Brooke Bonito. MD   LOS: 0 days   Dutch Gray 07/15/2021, 4:36 PM

## 2021-07-15 NOTE — Anesthesia Procedure Notes (Signed)
Procedure Name: Intubation Date/Time: 07/15/2021 7:25 AM Performed by: Lind Covert, CRNA Pre-anesthesia Checklist: Patient identified, Emergency Drugs available, Suction available and Patient being monitored Patient Re-evaluated:Patient Re-evaluated prior to induction Oxygen Delivery Method: Circle system utilized Preoxygenation: Pre-oxygenation with 100% oxygen Induction Type: IV induction Ventilation: Mask ventilation without difficulty Laryngoscope Size: Miller and 2 Grade View: Grade I Tube type: Oral Tube size: 7.0 mm Number of attempts: 1 Airway Equipment and Method: Stylet Placement Confirmation: ETT inserted through vocal cords under direct vision, positive ETCO2 and breath sounds checked- equal and bilateral Secured at: 21 cm Tube secured with: Tape Dental Injury: Teeth and Oropharynx as per pre-operative assessment  Comments: DL and intubation x1 by Liane Comber, SRNA. Atraumatic, +etCO2, +bilateral breath sounds.

## 2021-07-15 NOTE — Plan of Care (Signed)
Call bell and patient belongings placed within reach. Patient will call when needing assistance out of the bed. Bed alarm set.

## 2021-07-15 NOTE — Anesthesia Postprocedure Evaluation (Signed)
Anesthesia Post Note  Patient: Michelle Lamb  Procedure(s) Performed: XI ROBOTIC ASSISTED  LAPAROSCOPIC PYELOPLASTY (Right: Abdomen) CYSTOSCOPY WITH RETROGRADE PYELOGRAM/URETERAL STENT PLACEMENT (Right: Vagina )     Patient location during evaluation: PACU Anesthesia Type: General Level of consciousness: awake and alert and oriented Pain management: pain level controlled Vital Signs Assessment: post-procedure vital signs reviewed and stable Respiratory status: spontaneous breathing, nonlabored ventilation and respiratory function stable Cardiovascular status: blood pressure returned to baseline and stable Postop Assessment: no apparent nausea or vomiting Anesthetic complications: no   No notable events documented.  Last Vitals:  Vitals:   07/15/21 1100 07/15/21 1130  BP: 132/72 125/63  Pulse: 86 (!) 57  Resp: 15 13  Temp:    SpO2: 100% 100%    Last Pain:  Vitals:   07/15/21 1130  TempSrc:   PainSc: Asleep                 Serah Nicoletti A.

## 2021-07-15 NOTE — Discharge Instructions (Addendum)
Activity:  You are encouraged to ambulate frequently (about every hour during waking hours) to help prevent blood clots from forming in your legs or lungs.  However, you should not engage in any heavy lifting (> 10-15 lbs), strenuous activity, or straining. Diet: You should advance your diet as instructed by your physician.  It will be normal to have some bloating, nausea, and abdominal discomfort intermittently. Prescriptions:  You will be provided a prescription for pain medication to take as needed.  If your pain is not severe enough to require the prescription pain medication, you may take extra strength Tylenol instead which will have less side effects.  You should also take a prescribed stool softener to avoid straining with bowel movements as the prescription pain medication may constipate you. Incisions: You will either have some small staples or special tissue glue at each of the incision sites. Once the bandages are removed (if present), the incisions may stay open to air.  You may start showering (but not soaking or bathing in water) the 2nd day after surgery and the incisions simply need to be patted dry after the shower.  No additional care is needed. What to call us about: You should call the office [phone]) if you develop fever > 101 or develop persistent vomiting.

## 2021-07-15 NOTE — Transfer of Care (Signed)
Immediate Anesthesia Transfer of Care Note  Patient: Michelle Lamb  Procedure(s) Performed: XI ROBOTIC ASSISTED  LAPAROSCOPIC PYELOPLASTY (Right: Abdomen) CYSTOSCOPY WITH RETROGRADE PYELOGRAM/URETERAL STENT PLACEMENT (Right: Vagina )  Patient Location: PACU  Anesthesia Type:General  Level of Consciousness: sedated  Airway & Oxygen Therapy: Patient Spontanous Breathing and Patient connected to face mask oxygen  Post-op Assessment: Report given to RN and Post -op Vital signs reviewed and stable  Post vital signs: Reviewed and stable  Last Vitals:  Vitals Value Taken Time  BP    Temp    Pulse 88 07/15/21 1047  Resp 14 07/15/21 1047  SpO2 100 % 07/15/21 1047  Vitals shown include unvalidated device data.  Last Pain:  Vitals:   07/15/21 0625  TempSrc:   PainSc: 2       Patients Stated Pain Goal: 4 (00/45/99 7741)  Complications: No notable events documented.

## 2021-07-15 NOTE — Op Note (Signed)
Preoperative diagnosis: Right ureteral obstruction  Postoperative diagnosis: Right ureteral obstruction  Procedures: 1.  Cystoscopy 2.  Right retrograde pyelography and antegrade nephrostogram with interpretation 3.  Right ureteral stent placement and injection of indocyanine green dye 4.  Right renal exploration with ureteroureterostomy  Surgeon: Pryor Curia MD  Assistant: Dr. Louis Meckel  Anesthesia: General  Complications: None  EBL: 30 cc  Intravenous fluids: 1100 cc of crystalloid  Specimens: None  Drains: 1.  16 French Foley catheter 2.  #19 Blake periureteral drain  Intraoperative findings: Right retrograde pyelography indicated a normal caliber ureter up to the proximal ureter where there was a blind ending complete obstruction.  Antegrade nephrostogram indicated a normal renal collecting system without filling defects and without hydronephrosis but with complete obstruction of the proximal ureter about 1 to 1.5 cm above the level of obstruction of the distal segment.  Indication: Michelle Lamb is a 31 year old female who had a large right renal angiomyolipoma.  She underwent a right robot-assisted laparoscopic partial nephrectomy few months ago.  Approximately 1 month postoperatively, she was noted to have right-sided hydronephrosis and additional imaging and endoscopic evaluation revealed complete ureteral obstruction of the proximal ureter.  A right nephrostomy tube was placed for temporary management and she presents today for definitive surgical evaluation and reconstruction.  The potential risks, complications, and expected recovery process of the above procedure and alternatives were discussed in detail.  Informed consent was obtained.  Description of procedure: The patient was taken to the operating room and a general anesthetic was administered.  She was given preoperative antibiotics, placed in the dorsolithotomy position, and prepped and draped in usual  sterile fashion.  Next, cystourethroscopy was performed which revealed a normal bladder without evidence of bladder tumors, stones, or other mucosal pathology.  The right ureteral orifice was identified and intubated with a 6 French ureteral catheter.  Omnipaque contrast was injected with findings as dictated above indicating complete obstruction of the proximal ureter.  Indocyanine green dye was then injected through the catheter and the ureteral stent was left in place up to the level of obstruction.  A Foley catheter was inserted and the ureteral stent was tied to the Foley catheter.  The patient was then repositioned in the right modified flank position.  She was prepped and draped in usual sterile fashion.  A preoperative timeout was performed after it was again confirmed that she had received her preoperative antibiotics.  Care was taken to pad all potential pressure points.  A site was selected over her previous upper midline port site incision.  This was carried down through the subcutaneous tissues to the level of the fascia.  This was sharply entered and the peritoneal cavity was entered.  A 12 mm port was placed.  Inspection of the abdomen revealed no significant intra-abdominal adhesions.  8 mm robotic ports were then placed in the right upper quadrant with 2 additional 8 mm robotic ports in the right lower quadrant.  The surgical cart was then docked.  There were a few adhesions between the kidney and the liver which were sharply taken down.  The colon then was reflected medially with careful dissection.  Using fluorescence imaging, the ureter was easily identified with the previously injected and assigning dye in a retrograde fashion.  As the colon was mobilized medially, the ureter was able to be easily identified with the indwelling ureteral stent.  This was then carefully dissected free extending proximally.  A Hem-o-lok clip was identified at  the level of obstruction consistent with the source  for the obstruction likely related to her prior surgery.  Indocyanine green dye was then injected through the nephrostogram in an antegrade fashion.  The proximal segment of the ureter was able to be easily identified with fluorescence imaging in both the proximal ureter and distal ureter were carefully dissected free with a combination of cautery and sharp dissection until it was felt that a tension-free anastomosis could be performed.  The obstructed segment with the Hem-o-lok clip were then removed with both the proximal and distal aspect of the ureter sharply transected.  The ends of the ureter appeared to be quite viable and the proximal end was spatulated medially with the distal end spatulated laterally.  An interrupted 4-0 Vicryl suture was then placed to reapproximate the ureter and interrupted 4-0 Vicryl sutures were then placed in appropriate positions between the proximal and distal segment of the ureter to provide a tension-free and watertight anastomosis.  Prior to completion of the anastomosis, a 6 x 26 double-J ureteral stent was advanced over a 0.38 Glidewire and positioned appropriately in the ureter.  The anastomosis was then completed.  A #19 Blake drain was brought through the right lateral lower quadrant port and positioned in the periureteral space.  This was secured to the skin with a nylon suture.  The original 12 mm port site was then closed with a 0 PDS suture using a suture passer device to provide proper closure.  All ports were then removed under direct vision and the pneumoperitoneum was expelled.  The nephrostomy tube was capped.  The drain was placed to bulb suction.  The original 12 mm port site was then closed at the subcutaneous level with a Vicryl suture in all skin incisions were reapproximated with 4-0 Monocryl subcuticular closures and Dermabond was applied to the skin.  The patient tolerated the procedure well without complications.  She was able to be extubated and  transferred to recovery unit in satisfactory condition.

## 2021-07-16 ENCOUNTER — Encounter (HOSPITAL_COMMUNITY): Payer: Self-pay | Admitting: Urology

## 2021-07-16 ENCOUNTER — Inpatient Hospital Stay (HOSPITAL_COMMUNITY): Payer: No Typology Code available for payment source

## 2021-07-16 ENCOUNTER — Other Ambulatory Visit: Payer: Self-pay

## 2021-07-16 ENCOUNTER — Other Ambulatory Visit (HOSPITAL_COMMUNITY): Payer: Self-pay

## 2021-07-16 HISTORY — PX: IR NEPHRO TUBE REMOV/FL: IMG2342

## 2021-07-16 LAB — BASIC METABOLIC PANEL
Anion gap: 4 — ABNORMAL LOW (ref 5–15)
BUN: 10 mg/dL (ref 6–20)
CO2: 25 mmol/L (ref 22–32)
Calcium: 9 mg/dL (ref 8.9–10.3)
Chloride: 107 mmol/L (ref 98–111)
Creatinine, Ser: 0.75 mg/dL (ref 0.44–1.00)
GFR, Estimated: >60 mL/min (ref >60)
Glucose, Bld: 118 mg/dL — ABNORMAL HIGH (ref 70–99)
Potassium: 5.2 mmol/L — ABNORMAL HIGH (ref 3.5–5.1)
Sodium: 136 mmol/L (ref 135–145)

## 2021-07-16 LAB — POTASSIUM: Potassium: 4.6 mmol/L (ref 3.5–5.1)

## 2021-07-16 LAB — CREATININE, FLUID (PLEURAL, PERITONEAL, JP DRAINAGE)
Creat, Fluid: 0.6 mg/dL
Creat, Fluid: 0.6 mg/dL

## 2021-07-16 LAB — HEMOGLOBIN AND HEMATOCRIT, BLOOD
HCT: 31.2 % — ABNORMAL LOW (ref 36.0–46.0)
HCT: 36.6 % (ref 36.0–46.0)
Hemoglobin: 9.9 g/dL — ABNORMAL LOW (ref 12.0–15.0)
Hemoglobin: 11.4 g/dL — ABNORMAL LOW (ref 12.0–15.0)

## 2021-07-16 IMAGING — XA IR NEPHRO TUBE REMOV/FLUORO
3 series · 13 of 24 positions shown · non-contrast
Comparison: None Available.

INDICATION: Patient has a right percutaneous nephrostomy tube placed due to a
right ureter occlusion. Recent right ureteroureterostomy and
evaluate for nephrostomy tube removal.

EXAM:
1. Nephrostogram through existing catheter
2. Removal of nephrostomy tube with fluoroscopy

[aw electronic film · 3 of 22 slices shown (1 of 3)]
[im 1/22]
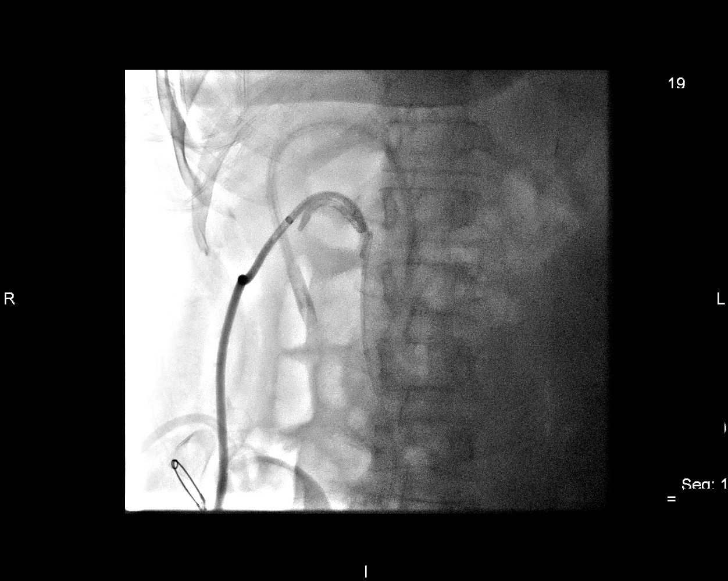
[im 9/22]
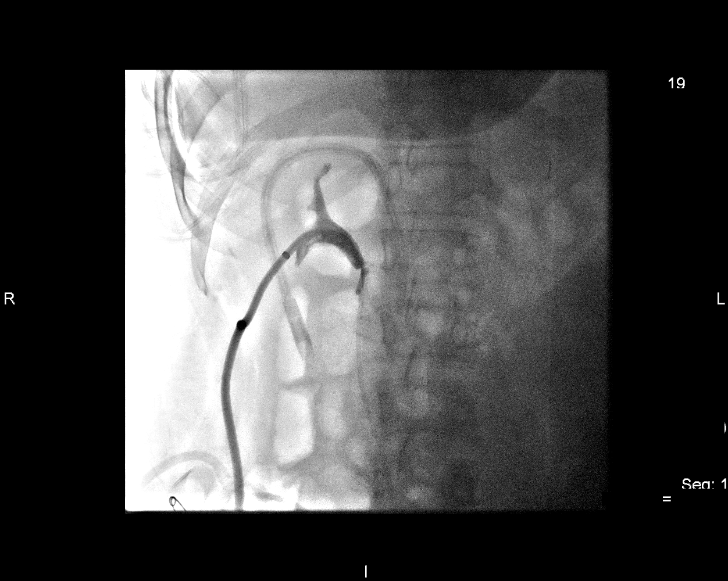
[im 17/22]
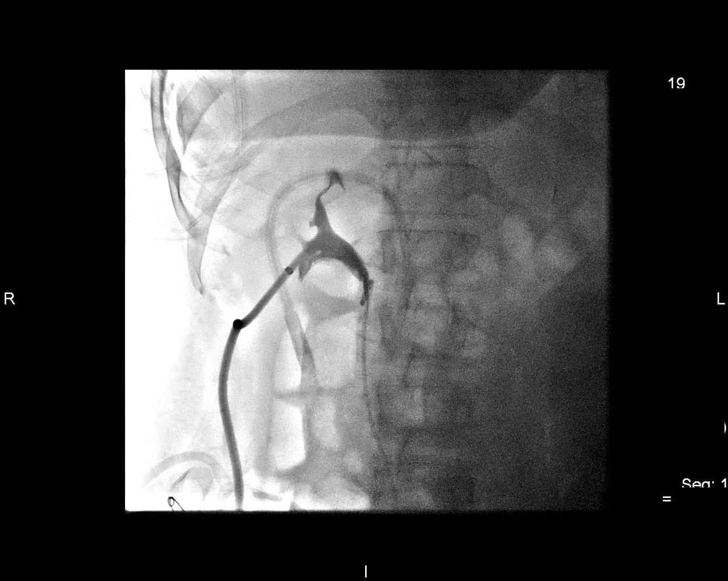

[aw electronic film · 9 of 66 slices shown (2 of 3)]
[im 1/66]
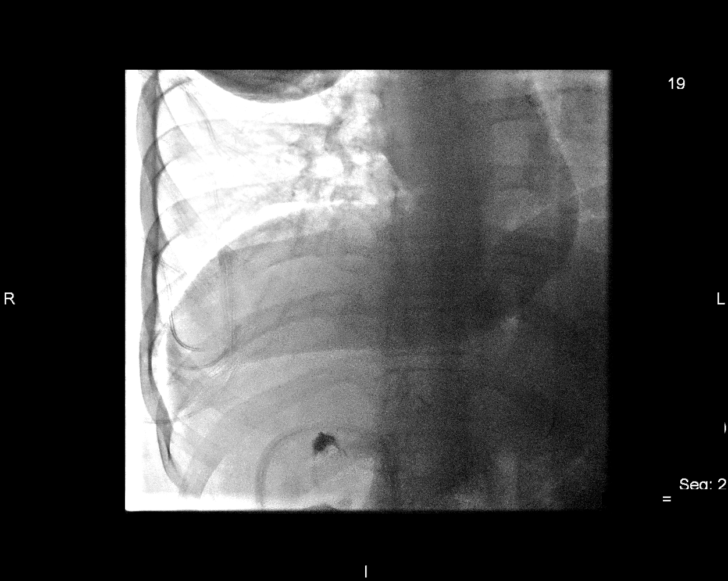
[im 9/66]
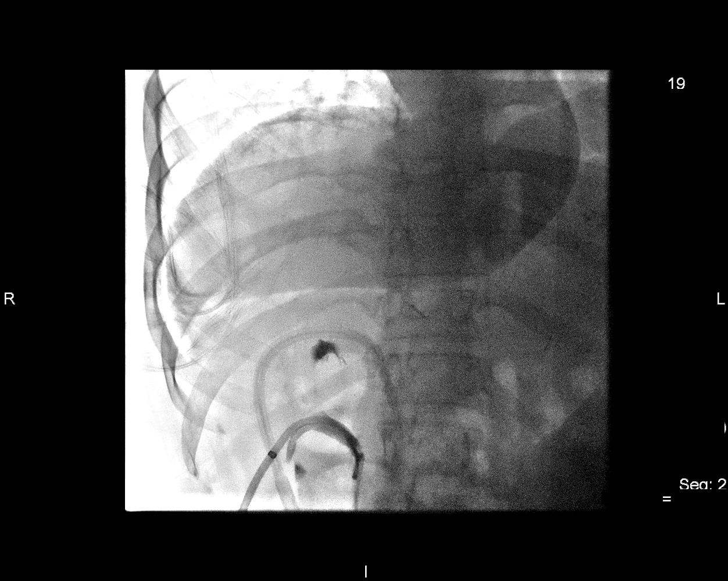
[im 17/66]
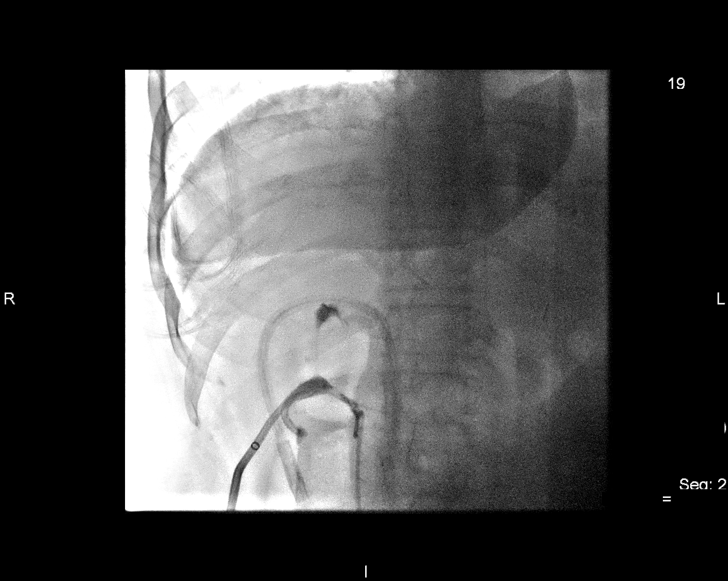
[im 25/66]
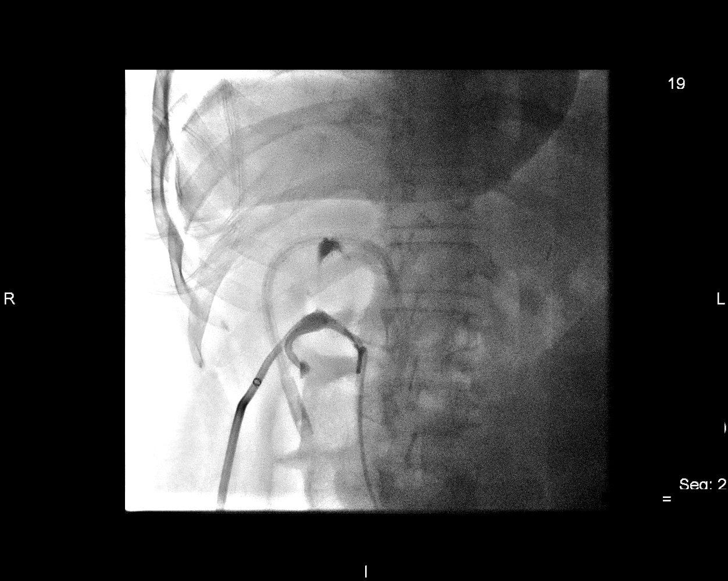
[im 29/66]
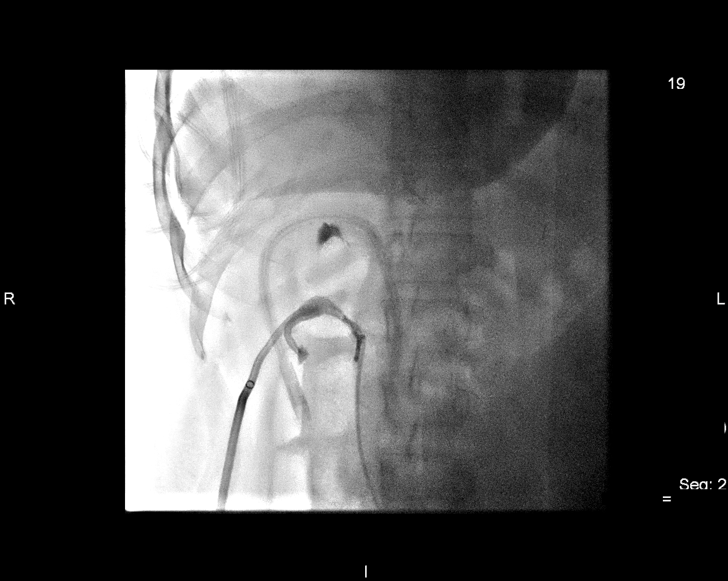
[im 37/66]
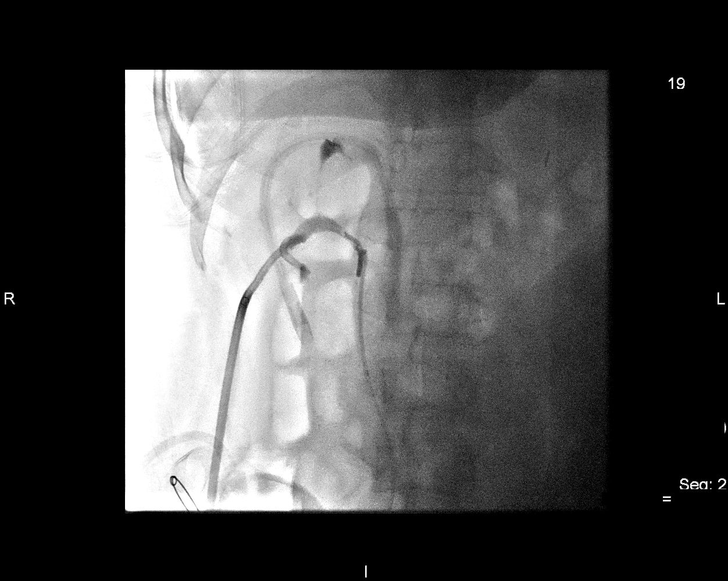
[im 45/66]
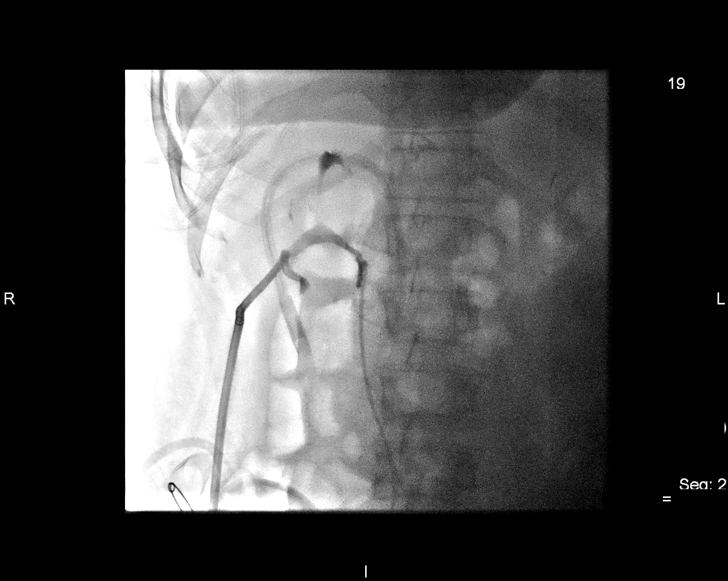
[im 53/66]
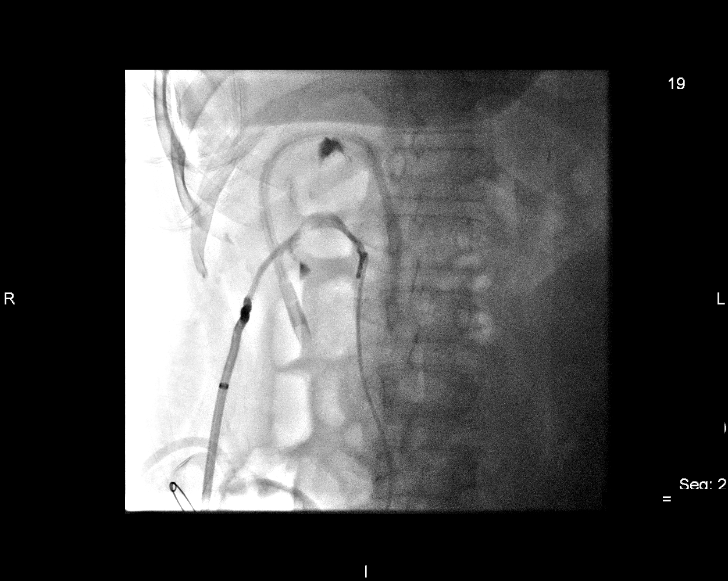
[im 61/66]
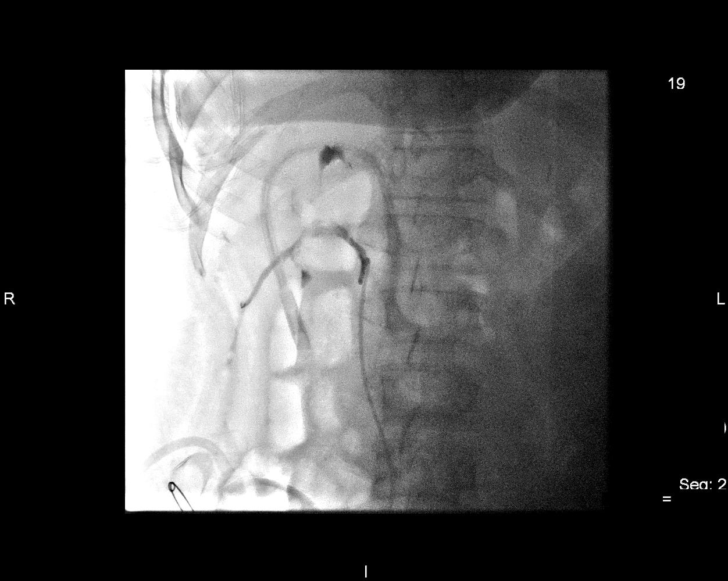

[aw electronic film · 1 of 3 slices shown (3 of 3)]
[im 1/3]
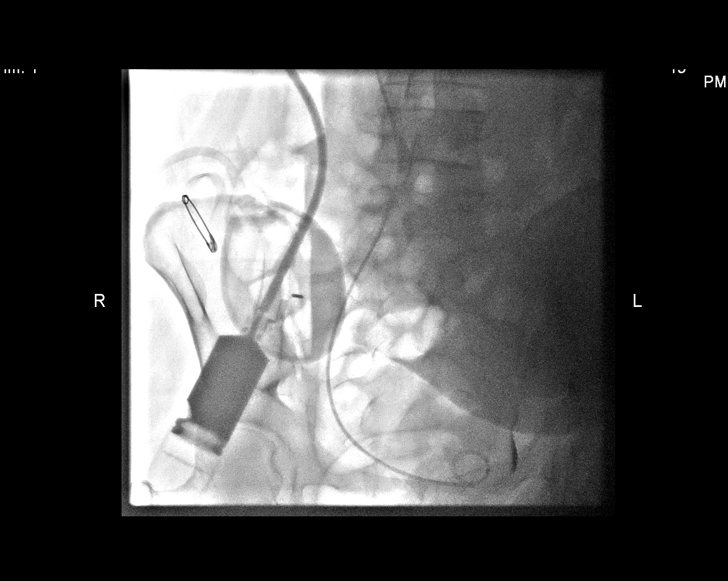

[13 of 24 positions shown; findings below may reference images not displayed]

MEDICATIONS:
None

ANESTHESIA/SEDATION:
None

CONTRAST:  10 mL Omnipaque 300-administered into the collecting
system(s)

FLUOROSCOPY:
Radiation Exposure Index (as provided by the fluoroscopic device):
16 mGy Kerma

COMPLICATIONS:
None immediate.

PROCEDURE:
Patient was placed on her left side. The right flank was prepped and
draped in sterile fashion. Maximal barrier sterile technique was
utilized including caps, mask, sterile gowns, sterile gloves,
sterile drape, hand hygiene and skin antiseptic.

Contrast was injected through the nephrostomy tube. Nephrostomy tube
was cut and removed under fluoroscopy. Bandage was placed.

Fluoroscopic images were taken and saved for this procedure.
FINDINGS: Nephrostomy tube was well positioned in the renal pelvis. The right
renal collecting system is decompressed. Double-J ureter stent with
the tip near the UPJ. Contrast injection confirmed a patent right
ureter stent. Right nephrostomy tube was completely removed without
displacing the right ureter stent.
IMPRESSION: 1. Right ureter stent is patent.
2. Removal of right nephrostomy tube with fluoroscopy.

## 2021-07-16 MED ORDER — IOHEXOL 300 MG/ML  SOLN
50.0000 mL | Freq: Once | INTRAMUSCULAR | Status: AC | PRN
  Administered 2021-07-16: 10 mL

## 2021-07-16 MED ORDER — BISACODYL 10 MG RE SUPP
10.0000 mg | Freq: Once | RECTAL | Status: AC
Start: 2021-07-16 — End: 2021-07-16
  Administered 2021-07-16: 10 mg via RECTAL
  Filled 2021-07-16: qty 1

## 2021-07-16 MED ORDER — CHLORHEXIDINE GLUCONATE CLOTH 2 % EX PADS
6.0000 | MEDICATED_PAD | Freq: Every day | CUTANEOUS | Status: DC
  Administered 2021-07-16: 6 via TOPICAL

## 2021-07-16 NOTE — TOC Progression Note (Signed)
Transition of Care [facility]) - Progression Note    Patient Details  Name: Michelle Lamb MRN: [id number] Date of Birth: 01/16/1991  Transition of Care [facility]) CM/SW Contact  Purcell Mouton, RN Phone Number: 07/16/2021, 10:41 AM  Clinical Narrative:      Transition of Care (TOC) Screening Note   Patient Details  Name: Michelle Lamb Date of Birth: 07-12-1990   Transition of Care [facility]) CM/SW Contact:    Purcell Mouton, RN Phone Number: 07/16/2021, 10:41 AM    Transition of Care Department (TOC) has reviewed patient and no TOC needs have been identified at this time. We will continue to monitor patient advancement through interdisciplinary progression rounds. If new patient transition needs arise, please place a TOC consult.         Expected Discharge Plan and Services                                                 Social Determinants of Health (SDOH) Interventions    Readmission Risk Interventions     View : No data to display.

## 2021-07-16 NOTE — Plan of Care (Signed)

## 2021-07-16 NOTE — Progress Notes (Addendum)
Patient ID: Michelle Lamb, female   DOB: 30-Oct-1990, 31 y.o.   MRN: [id number]  1 Day Post-Op Subjective: Pt doing well overall.  Has right sided abdominal pain as expected but controlled with pain medication.  Passing flatus and tolerating diet thus far.  Objective: Vital signs in last 24 hours: Temp:  [96.8 F (36 C)-97.8 F (36.6 C)] 97.8 F (36.6 C) (05/19 0321) Pulse Rate:  [49-86] 49 (05/19 0321) Resp:  [11-18] 16 (05/19 0321) BP: (106-132)/(63-78) 115/73 (05/19 0321) SpO2:  [97 %-100 %] 100 % (05/19 0321)  Intake/Output from previous day: 05/18 0701 - 05/19 0700 In: 2698.6 [P.O.:360; I.V.:2233.9; IV Piggyback:104.7] Out: 1665 [Urine:1575; Drains:60; Blood:30] Intake/Output this shift: No intake/output data recorded.  Physical Exam:  General: Alert and oriented Abdomen: Soft, ND, positive BS, drain with minimal output Incisions: C/D/I Ext: NT, No erythema  Lab Results: Recent Labs    07/15/21 1202 07/16/21 0418  HGB 12.0 9.9*  HCT 39.6 31.2*   BMET Recent Labs    07/15/21 1202 07/16/21 0418  NA 135 136  K 4.4 5.2*  CL 106 107  CO2 20* 25  GLUCOSE 138* 118*  BUN 14 10  CREATININE 0.74 0.75  CALCIUM 8.9 9.0    Drain Cr 0.6   Studies/Results: DG Abd 1 View  Result Date: 07/15/2021 CLINICAL DATA:  Status post right ureteral stent placement. EXAM: ABDOMEN - 1 VIEW COMPARISON:  CT scan of June 04, 2021. FINDINGS: The bowel gas pattern is normal. Right nephrostomy catheter is noted in expected position of right kidney. Right ureteral stent appears to be in grossly good position extending from expected position of right kidney too urinary bladder. There appears to be a surgical drain in the right upper quadrant. Status post cholecystectomy. IMPRESSION: No abnormal bowel dilatation is noted. Surgical drain is seen in right upper quadrant. Right nephrostomy catheter and right-sided ureteral stent are noted. Electronically Signed   By: Marijo Conception M.D.   On:  07/15/2021 11:46   DG C-Arm 1-60 Min-No Report  Result Date: 07/15/2021 Fluoroscopy was utilized by the requesting physician.  No radiographic interpretation.    Assessment/Plan: POD # 1 s/p right ureteroureterostomy - D/C Foley - Drain Cr c/w serum.  Ok for IR to remove nephrostomy tube later today assuming that drain output does not significantly increase.  Will discuss with IR later. - Advance diet - Oral pain medication - Ambulate - Drop in Hgb likely dilutional but will recheck later today   LOS: 1 day   Dutch Gray 07/16/2021, 7:06 AM

## 2021-07-16 NOTE — Plan of Care (Signed)

## 2021-07-16 NOTE — Procedures (Signed)
Interventional Radiology Procedure:   Indications:  Right ureteroureterostomy.  Plan for removal of right nephrostomy tube  Procedure: Nephrostomy tube injection and removal under fluoroscopy  Findings: Ureter stent is patent.  Nephrostomy removed without disturbing ureter stent  Complications: No immediate complications noted.     EBL: Minimal  Plan: No IR follow up needed at this point.    Michelle Pozzi R. Anselm Pancoast, MD  Pager: [phone]

## 2021-07-17 ENCOUNTER — Other Ambulatory Visit (HOSPITAL_COMMUNITY): Payer: Self-pay

## 2021-07-17 LAB — HEMOGLOBIN AND HEMATOCRIT, BLOOD
HCT: 29.8 % — ABNORMAL LOW (ref 36.0–46.0)
HCT: 28.6 % — ABNORMAL LOW (ref 36.0–46.0)
Hemoglobin: 9.2 g/dL — ABNORMAL LOW (ref 12.0–15.0)
Hemoglobin: 9.2 g/dL — ABNORMAL LOW (ref 12.0–15.0)

## 2021-07-17 LAB — BASIC METABOLIC PANEL
Anion gap: 4 — ABNORMAL LOW (ref 5–15)
BUN: 17 mg/dL (ref 6–20)
CO2: 25 mmol/L (ref 22–32)
Calcium: 8.6 mg/dL — ABNORMAL LOW (ref 8.9–10.3)
Chloride: 110 mmol/L (ref 98–111)
Creatinine, Ser: 0.69 mg/dL (ref 0.44–1.00)
GFR, Estimated: >60 mL/min (ref >60)
Glucose, Bld: 77 mg/dL (ref 70–99)
Potassium: 4.6 mmol/L (ref 3.5–5.1)
Sodium: 139 mmol/L (ref 135–145)

## 2021-07-17 LAB — SURGICAL PATHOLOGY

## 2021-07-17 MED ORDER — SODIUM CHLORIDE 0.9 % IV BOLUS
500.0000 mL | Freq: Once | INTRAVENOUS | Status: AC
  Administered 2021-07-17: 500 mL via INTRAVENOUS

## 2021-07-17 NOTE — Progress Notes (Signed)
Made on call provider aware about patient's Hgb of 9.2 and patient's BP of 94/62 and HR of 59.

## 2021-07-17 NOTE — Progress Notes (Signed)
Urology Inpatient Progress Report  Ureteral obstruction, right [N13.5]  Procedure(s): XI ROBOTIC ASSISTED  LAPAROSCOPIC PYELOPLASTY CYSTOSCOPY WITH RETROGRADE PYELOGRAM/URETERAL STENT PLACEMENT  2 Days Post-Op   Intv/Subj: Patient had some soft blood pressures overnight.  Hemoglobin this morning was 9.2.  I checked it 3 hours later and it remains 9.2.  Was 9.9 yesterday morning with a 11.4 recheck.  JP drain with serous fluid, no sanguinous fluid.  Very low likelihood of intra-abdominal bleeding.  Patient continues to have some intermittent right-sided abdominal pain/discomfort.  She is having some dizziness.  Feels dizzy when she stands up.  Orthostatic blood pressures were taken.  Blood pressure was improved with standing rather than worsening.  Principal Problem:   Ureteral obstruction, right  Current Facility-Administered Medications  Medication Dose Route Frequency Provider Last Rate Last Admin   acetaminophen (TYLENOL) tablet 650 mg  650 mg Oral Q4H PRN Raynelle Bring, MD       albuterol (PROVENTIL) (2.5 MG/3ML) 0.083% nebulizer solution 3 mL  3 mL Inhalation Q6H PRN Raynelle Bring, MD       Chlorhexidine Gluconate Cloth 2 % PADS 6 each  6 each Topical Daily Raynelle Bring, MD   6 each at 07/16/21 0904   dextrose 5 % and 0.45 % NaCl with KCl 20 mEq/L infusion   Intravenous Continuous Raynelle Bring, MD   Stopped at 07/16/21 0757   diphenhydrAMINE (BENADRYL) injection 12.5 mg  12.5 mg Intravenous Q6H PRN Raynelle Bring, MD       Or   diphenhydrAMINE (BENADRYL) 12.5 MG/5ML elixir 12.5 mg  12.5 mg Oral Q6H PRN Raynelle Bring, MD       docusate sodium (COLACE) capsule 100 mg  100 mg Oral BID Raynelle Bring, MD   100 mg at 07/17/21 0847   gabapentin (NEURONTIN) capsule 100 mg  100 mg Oral Daily PRN Raynelle Bring, MD       HYDROcodone-acetaminophen (NORCO/VICODIN) 5-325 MG per tablet 1-2 tablet  1-2 tablet Oral Q4H PRN Raynelle Bring, MD   2 tablet at 07/17/21 0900   ketorolac  (TORADOL) 15 MG/ML injection 15 mg  15 mg Intravenous Q6H Raynelle Bring, MD   15 mg at 07/17/21 0558   LORazepam (ATIVAN) tablet 1 mg  1 mg Oral Daily PRN Raynelle Bring, MD       mometasone-formoterol [facility]) 100-5 MCG/ACT inhaler 2 puff  2 puff Inhalation BID Raynelle Bring, MD   2 puff at 07/17/21 0830   ondansetron (ZOFRAN) injection 4 mg  4 mg Intravenous Q4H PRN Raynelle Bring, MD       pantoprazole (PROTONIX) EC tablet 40 mg  40 mg Oral Daily Raynelle Bring, MD   40 mg at 07/17/21 0847   scopolamine (TRANSDERM-SCOP) 1 MG/3DAYS 1.5 mg  1 patch Transdermal Berlinda Last, MD   1.5 mg at 07/15/21 0636   sodium chloride 0.9 % bolus 500 mL  500 mL Intravenous Once Lucas Mallow, MD       [START ON 07/20/2021] Vitamin D (Ergocalciferol) (DRISDOL) capsule 50,000 Units  50,000 Units Oral Once per day on Tue Thu Borden, Lester, MD         Objective: Vital: Vitals:   07/16/21 1523 07/16/21 2125 07/17/21 0416 07/17/21 0831  BP: 120/73 108/63 94/62   Pulse: 67 62 (!) 59   Resp:  19 20   Temp:  98.6 F (37 C) 98.3 F (36.8 C)   TempSrc:  Oral Oral   SpO2:  100% 100% 99%  Weight:  Height:       I/Os: I/O last 3 completed shifts: In: 2485.5 [P.O.:840; I.V.:1595.5; IV Piggyback:50] Out: 2150 [Urine:2075; Drains:75]  Physical Exam:  General: Patient is in no apparent distress Lungs: Normal respiratory effort, chest expands symmetrically. GI: Incisions are c/d/i. The abdomen is soft and nontender without mass. JP drain with scant serous drainage, JP creatinine normal x2  Ext: lower extremities symmetric  Lab Results: Recent Labs    07/16/21 1142 07/17/21 0411 07/17/21 0717  HGB 11.4* 9.2* 9.2*  HCT 36.6 29.8* 28.6*   Recent Labs    07/15/21 1202 07/16/21 0418 07/16/21 0632 07/17/21 0411  NA 135 136  --  139  K 4.4 5.2* 4.6 4.6  CL 106 107  --  110  CO2 20* 25  --  25  GLUCOSE 138* 118*  --  77  BUN 14 10  --  17  CREATININE 0.74 0.75  --  0.69  CALCIUM  8.9 9.0  --  8.6*   No results for input(s): LABPT, INR in the last 72 hours. No results for input(s): LABURIN in the last 72 hours. Results for orders placed or performed during the hospital encounter of 06/04/21  Urine Culture     Status: Abnormal   Collection Time: 06/04/21  8:08 AM   Specimen: Urine, Catheterized  Result Value Ref Range Status   Specimen Description   Final    URINE, CATHETERIZED Performed at [facility], [place] [address] [place], [place] [address]    Special Requests   Final    NONE Performed at [facility], [place] [address] [place], [place] [address]    Culture >=100,000 COLONIES/mL STAPHYLOCOCCUS AUREUS (A)  Final   Report Status 06/06/2021 FINAL  Final   Organism ID, Bacteria STAPHYLOCOCCUS AUREUS (A)  Final      Susceptibility   Staphylococcus aureus - MIC*    CIPROFLOXACIN <=0.5 SENSITIVE Sensitive     GENTAMICIN <=0.5 SENSITIVE Sensitive     NITROFURANTOIN <=16 SENSITIVE Sensitive     OXACILLIN <=0.25 SENSITIVE Sensitive     TETRACYCLINE <=1 SENSITIVE Sensitive     VANCOMYCIN 1 SENSITIVE Sensitive     TRIMETH/SULFA <=10 SENSITIVE Sensitive     CLINDAMYCIN <=0.25 SENSITIVE Sensitive     RIFAMPIN <=0.5 SENSITIVE Sensitive     Inducible Clindamycin NEGATIVE Sensitive     * >=100,000 COLONIES/mL STAPHYLOCOCCUS AUREUS    Studies/Results: DG Abd 1 View  Result Date: 07/15/2021 CLINICAL DATA:  Status post right ureteral stent placement. EXAM: ABDOMEN - 1 VIEW COMPARISON:  CT scan of June 04, 2021. FINDINGS: The bowel gas pattern is normal. Right nephrostomy catheter is noted in expected position of right kidney. Right ureteral stent appears to be in grossly good position extending from expected position of right kidney too urinary bladder. There appears to be a surgical drain in the right upper quadrant. Status post cholecystectomy. IMPRESSION: No abnormal bowel dilatation is noted. Surgical drain is seen in right upper  quadrant. Right nephrostomy catheter and right-sided ureteral stent are noted. Electronically Signed   By: Marijo Conception M.D.   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 Recent right ureteroureterostomy and evaluate for nephrostomy tube removal. EXAM: 1. Nephrostogram through existing catheter 2. Removal of nephrostomy tube with fluoroscopy COMPARISON:  None Available. MEDICATIONS: None ANESTHESIA/SEDATION: None CONTRAST:  10 mL Omnipaque 300-administered  into the collecting system(s) FLUOROSCOPY: Radiation Exposure Index (as provided by the fluoroscopic device): 16 mGy Kerma COMPLICATIONS: None immediate. PROCEDURE: Patient was placed on her left side. The right flank was prepped and draped in sterile fashion. Maximal barrier sterile technique was utilized including caps, mask, sterile gowns, sterile gloves, sterile drape, hand hygiene and skin antiseptic. Contrast was injected through the nephrostomy tube. Nephrostomy tube was cut and removed under fluoroscopy. Bandage was placed. Fluoroscopic images were taken and saved for this procedure. FINDINGS: Nephrostomy tube was well positioned in the renal pelvis. The right renal collecting system is decompressed. Double-J ureter stent with the tip near the UPJ. Contrast injection confirmed a patent right ureter stent. Right nephrostomy tube was completely removed without displacing the right ureter stent. IMPRESSION: 1. Right ureter stent is patent. 2. Removal of right nephrostomy tube with fluoroscopy. Electronically Signed   By: Markus Daft M.D.   On: 07/16/2021 17:13    Assessment: Right ureteral obstruction status post right ureteroureterostomy  Procedure(s): XI ROBOTIC ASSISTED  LAPAROSCOPIC PYELOPLASTY CYSTOSCOPY WITH RETROGRADE PYELOGRAM/URETERAL STENT PLACEMENT, 2 Days Post-Op  doing well.  Plan: We will give her 500  cc normal saline bolus.  Continue general diet.  Keep JP drain for now until discharge.  If she feels improved this afternoon can go home after removing JP.  However, she is continuing to have some lightheadedness/dizziness.  We will likely observe another day to make sure she does okay with plans for removing JP in the morning.  No further labs necessary.   Link Snuffer, MD Urology 07/17/2021, 11:01 AM

## 2021-07-18 NOTE — Discharge Summary (Signed)
Physician Discharge Summary  Patient ID: Michelle Lamb MRN: [id number] DOB/AGE: 1991/01/06 31 y.o.  Admit date: 07/15/2021 Discharge date: 07/18/2021  Admission Diagnoses:  Discharge Diagnoses:  Principal Problem:   Ureteral obstruction, right   Discharged Condition: good  Hospital Course: Patient underwent a right renal exploration with ureteroureterostomy on 07/15/2021.  She did well postoperatively.  She was a little bit slow to progress.  On postoperative day 2, her nephrostomy tube was removed by interventional radiology without issue.  Her JP drain remained in place.  There is no increase in output and repeat JP creatinine was normal.  On postoperative day 3, she had some dizziness and hypotension.  Hemoglobin remained stable on recheck and she had serous output without any sanguinous output from the JP.  She was given 500 cc normal saline bolus and remained under observation for another day.  The following day, she was feeling much improved without any dizziness.  Pain had improved as well.  JP drain continued to have minimal output and therefore was discontinued.  She was discharged home in stable condition.  Consults: None  Significant Diagnostic Studies: labs: JP creatinine normal.  Hemoglobin stable at 9.2 on recheck.  Treatments: surgery: As above  Discharge Exam: Blood pressure 115/66, pulse 76, temperature 98.7 F (37.1 C), temperature source Oral, resp. rate 18, height '5\' 3"'$  (1.6 m), weight 82.6 kg, last menstrual period 07/01/2021, SpO2 100 %. General appearance: alert no acute distress Sitting on the side of the bed. Abdomen soft, appropriately tender, incisions clean dry and intact, JP with minimal serous output  Disposition: Discharge disposition: 01-Home or Self Care        Allergies as of 07/18/2021   No Known Allergies      Medication List     TAKE these medications    albuterol 108 (90 Base) MCG/ACT inhaler Commonly known as: VENTOLIN HFA Inhale 2  puffs into the lungs every 6 (six) hours as needed for wheezing or shortness of breath.   budesonide-formoterol 80-4.5 MCG/ACT inhaler Commonly known as: SYMBICORT Inhale 2 puffs into the lungs daily as needed (wheezing).   Cholecalciferol 1.25 MG (50000 UT) capsule Take 1 po twice weekly What changed:  how much to take how to take this when to take this additional instructions   docusate sodium 100 MG capsule Commonly known as: COLACE Take 1 capsule (100 mg total) by mouth 2 (two) times daily. What changed: when to take this   gabapentin 100 MG capsule Commonly known as: NEURONTIN Take 100 mg by mouth daily as needed (pain).   HYDROcodone-acetaminophen 5-325 MG tablet Commonly known as: NORCO/VICODIN Take 1 - 2 tablets by mouth every 6 hours as needed.   LORazepam 1 MG tablet Commonly known as: ATIVAN Take 1 tablet (1 mg total) by mouth daily as needed. What changed:  when to take this reasons to take this   omeprazole 20 MG capsule Commonly known as: PRILOSEC Take twice daily while taking prednisone What changed:  how much to take how to take this when to take this additional instructions   ondansetron 4 MG disintegrating tablet Commonly known as: ZOFRAN-ODT Take 1 tablet (4 mg total) by mouth every 8 (eight) hours as needed for nausea or vomiting.   oxyCODONE-acetaminophen 5-325 MG tablet Commonly known as: PERCOCET/ROXICET Take 1 tablet by mouth every 6 (six) hours as needed for severe pain.   Ozempic (0.25 or 0.5 MG/DOSE) 2 MG/3ML Sopn Generic drug: Semaglutide(0.25 or 0.'5MG'$ /DOS) Inject 0.5 mg into the skin once  a week.        Follow-up Information     Raynelle Bring, MD Follow up.   Specialty: Urology Why: 08/03/21 at 10:30 AM Contact information: [place] [address] [phone]                 Signed: Marton Redwood, III 07/18/2021, 11:19 AM

## 2021-07-18 NOTE — Progress Notes (Signed)
AVS given to patient and explained at the bedside. Medications and follow up appointments have been explained with pt verbalizing understanding.  

## 2021-07-19 ENCOUNTER — Other Ambulatory Visit (HOSPITAL_COMMUNITY): Payer: Self-pay | Admitting: Radiology

## 2021-07-21 ENCOUNTER — Telehealth: Payer: Self-pay | Admitting: *Deleted

## 2021-07-21 ENCOUNTER — Other Ambulatory Visit: Payer: Self-pay

## 2021-07-21 DIAGNOSIS — I10 Essential (primary) hypertension: Secondary | ICD-10-CM

## 2021-07-21 DIAGNOSIS — E669 Obesity, unspecified: Secondary | ICD-10-CM

## 2021-07-21 NOTE — Patient Outreach (Signed)
Received a hospital discharge notification for Michelle Lamb . The Primary Care Physician is using [facility].   I have sent a referral to the [place], [place], [place] Management [phone]

## 2021-07-21 NOTE — Chronic Care Management (AMB) (Signed)
  Care Management   Note  07/21/2021 Name: Michelle Lamb MRN: [id number] DOB: 1990-10-21  Michelle Lamb is a 31 y.o. year old female who is a primary care patient of [name], [name]. I reached out to [name] by phone today offer care coordination services.   Michelle Lamb was given information about care management services today including:  Care management services include personalized support from designated clinical staff supervised by her physician, including individualized plan of care and coordination with other care providers 24/7 contact phone numbers for assistance for urgent and routine care needs. The patient may stop care management services at any time by phone call to the office staff.  Patient agreed to services and verbal consent obtained.   Follow up plan: Telephone appointment with care management team member scheduled for:07/22/21 [place] [facility]  Direct Dial: [phone]

## 2021-07-22 ENCOUNTER — Ambulatory Visit: Payer: No Typology Code available for payment source

## 2021-07-22 NOTE — Patient Instructions (Signed)
Visit Information  Thank you for taking time to visit with me today. Please don't hesitate to contact me if I can be of assistance to you before our next scheduled telephone appointment.  Our next appointment is by telephone on August 12, 2021 at 10:30.  Please call the care guide team at [phone] if you need to cancel or reschedule your appointment.   If you are experiencing a Mental Health or [place] or need someone to talk to, please call the [place] National Suicide Prevention Lifeline: 920-403-4406 or TTY: 2035543288 TTY [phone]) to talk to a trained counselor   Patient verbalizes understanding of instructions and care plan provided today and agrees to view in [place]. Active MyChart status and patient understanding of how to access instructions and care plan via MyChart confirmed with patient.     The patient has been provided with contact information for the care management team and has been advised to call with any health related questions or concerns.   Johnney Killian, RN, BSN, CCM Care Management Coordinator Phone: [phone]: [phone]

## 2021-07-22 NOTE — Chronic Care Management (AMB) (Signed)
Care Management    RN Visit Note  07/22/2021 Name: Michelle Lamb MRN: [id number] DOB: April 10, 1990  Subjective: Michelle Lamb is a 31 y.o. year old female who is a primary care patient of Michelle Lamb, Michelle Lamb. The care management team was consulted for assistance with disease management and care coordination needs.    Engaged with patient by telephone for initial visit in response to provider referral for case management and/or care coordination services.   Consent to Services:   [name] was given information about Care Management services today including:  Care Management services includes personalized support from designated clinical staff supervised by her physician, including individualized plan of care and coordination with other care providers 24/7 contact phone numbers for assistance for urgent and routine care needs. The patient may stop case management services at any time by phone call to the office staff.  Patient agreed to services and consent obtained.   Assessment: Review of patient past medical history, allergies, medications, health status, including review of consultants reports, laboratory and other test data, was performed as part of comprehensive evaluation and provision of chronic care management services.   SDOH (Social Determinants of Health) assessments and interventions performed:    Care Plan  No Known Allergies  Outpatient Encounter Medications as of 07/22/2021  Medication Sig   albuterol (VENTOLIN HFA) 108 (90 Base) MCG/ACT inhaler Inhale 2 puffs into the lungs every 6 (six) hours as needed for wheezing or shortness of breath.   budesonide-formoterol (SYMBICORT) 80-4.5 MCG/ACT inhaler Inhale 2 puffs into the lungs daily as needed (wheezing).   Cholecalciferol 1.25 MG (50000 UT) capsule Take 1 po twice weekly (Patient taking differently: Take 50,000 Units by mouth 2 (two) times a week. Tuesdays & Thursdays)   docusate sodium (COLACE) 100 MG capsule Take 1 capsule  (100 mg total) by mouth 2 (two) times daily. (Patient taking differently: Take 100 mg by mouth daily.)   gabapentin (NEURONTIN) 100 MG capsule Take 100 mg by mouth daily as needed (pain).   HYDROcodone-acetaminophen (NORCO/VICODIN) 5-325 MG tablet Take 1 - 2 tablets by mouth every 6 hours as needed.   LORazepam (ATIVAN) 1 MG tablet Take 1 tablet (1 mg total) by mouth daily as needed. (Patient taking differently: Take 1 mg by mouth at bedtime as needed for anxiety or sleep.)   omeprazole (PRILOSEC) 20 MG capsule Take twice daily while taking prednisone (Patient taking differently: Take 20 mg by mouth in the morning.)   ondansetron (ZOFRAN-ODT) 4 MG disintegrating tablet Take 1 tablet (4 mg total) by mouth every 8 (eight) hours as needed for nausea or vomiting.   oxyCODONE-acetaminophen (PERCOCET/ROXICET) 5-325 MG tablet Take 1 tablet by mouth every 6 (six) hours as needed for severe pain.   Semaglutide,0.25 or 0.5MG/DOS, (OZEMPIC, 0.25 OR 0.5 MG/DOSE,) 2 MG/3ML SOPN Inject 0.5 mg into the skin once a week.   No facility-administered encounter medications on file as of 07/22/2021.    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   Asthma 05/22/2020   Obstructive sleep apnea 10/29/2019  Hypertension, benign essential, goal below 140/90 06/20/2019   Diabetes mellitus type 2 in obese ([place]) 06/20/2019   BMI 50.0-59.9, adult ([place]) 12/28/2015   History of herpes simplex infection (serology only) 12/28/2015    Conditions to be addressed/monitored:  HTN and DMII  Care Plan : RN Care Manager Plan of Care  Updates made by Johnney Killian, RN since 07/22/2021 12:00 AM     Problem: Health Promotion or Disease Self-Management (General Plan of Care)      Goal: Chronic Disease Management and Care Coordination Needs (HTN, DM2)   Start Date: 07/22/2021  This Visit's Progress: On track  Note:   Transition Care Management Follow-up Telephone Call Date of discharge and from where: [facility]-07/18/21 How have you been since you were released from the hospital? Patient notes that she has been doing ok, however she woke up this morning and she has a bit of blurry visions and dizziness.  Inquired if she had taken her CBG reading and it was 69.  She is also having a bit more pain this morning and she felt this was related to "overdoing it".  Asked if she had taken her blood pressure and her machine is broken,  Her mother is suppose to get another for her today. Patient is only taking 1/2 Hydrocodone 5/325 every 4-6 hours. Encouraged patient to call her surgeon and inform them of her symptoms and if she could not reach them she should call the clinic to get in to be seen.  Message sent to both urologist and PCP. Any questions or concerns? Yes (see above)  Items Reviewed: Did the pt receive and understand the discharge instructions provided? Yes  Medications obtained and verified? Yes  Other? No  Any new allergies since your discharge? No  Dietary orders reviewed? Yes Do you have support at home? Yes - Mother assists, patient has a 42 year old son, Michelle Lamb.  Home Care and Equipment/Supplies: Were home health services ordered? no If so, what is the name of the agency? N/A  Has the agency set up a time to come to the patient's home? not applicable Were any new equipment or medical supplies ordered?  No What is the name of the medical supply agency? N/A Were you able to get the supplies/equipment? not applicable Do you have any questions  related to the use of the equipment or supplies? No  Functional Questionnaire: (I = Independent and D = Dependent) ADLs: I Bathing/Dressing- I  Meal Prep- I  Eating- I  Maintaining continence- I  Transferring/Ambulation- I  Managing Meds- I  Follow up appointments reviewed:  PCP Hospital f/u appt confirmed? No - Patient to call and schedule within the next week Specialist Hospital f/u appt confirmed? Yes  Scheduled to see urology on 08/03/21@10 :30. Are transportation arrangements needed? No  If their condition worsens, is the pt aware to call PCP or go to the Emergency Dept.? Yes Was the patient provided with contact information for the PCP's office or ED? Yes Was to pt encouraged to call back with questions or concerns? Yes  Current Barriers:  Chronic Disease Management support and education needs related to HTN and DMII   RNCM Clinical Goal(s):  Patient will take all medications exactly as prescribed and will call provider for medication related questions as evidenced by Discussion with RN Care Coordinator continue to work with RN Care Manager to address care management and care coordination needs related to  HTN and DMII as evidenced by adherence to CM Team  Scheduled appointments through collaboration with RN Care manager, provider, and care team.   Interventions: 1:1 collaboration with primary care provider regarding development and update of comprehensive plan of care as evidenced by provider attestation and co-signature Inter-disciplinary care team collaboration (see longitudinal plan of care) Evaluation of current treatment plan related to  self management and patient's adherence to plan as established by provider   Diabetes Interventions:  (Status:  Goal on track:  Yes.) Long Term Goal Assessed patient's understanding of A1c goal: <7% Reviewed medications with patient and discussed importance of medication adherence Discussed plans with patient for ongoing care  management follow up and provided patient with direct contact information for care management team Review of patient status, including review of consultants reports, relevant laboratory and other test results, and medications completed Lab Results  Component Value Date   HGBA1C 5.1 07/07/2021   Hypertension Interventions:  (Status:  Goal on track:  Yes.) Long Term Goal Last practice recorded BP readings:  BP Readings from Last 3 Encounters:  07/18/21 115/66  07/07/21 138/81  06/04/21 122/81  Most recent eGFR/CrCl:  Lab Results  Component Value Date   EGFR 110 04/09/2021    No components found for: CRCL  Evaluation of current treatment plan related to hypertension self management and patient's adherence to plan as established by provider Reviewed medications with patient and discussed importance of compliance Discussed plans with patient for ongoing care management follow up and provided patient with direct contact information for care management team Reviewed scheduled/upcoming provider appointments including:   Patient Goals/Self-Care Activities: Take all medications as prescribed Attend all scheduled provider appointments Call pharmacy for medication refills 3-7 days in advance of running out of medications  Follow Up Plan:  The patient has been provided with contact information for the care management team and has been advised to call with any health related questions or concerns.      Johnney Killian, RN, BSN, CCM Care Management Coordinator Phone: [phone]: [phone]

## 2021-07-27 ENCOUNTER — Other Ambulatory Visit (HOSPITAL_COMMUNITY): Payer: Self-pay

## 2021-07-28 ENCOUNTER — Other Ambulatory Visit (HOSPITAL_COMMUNITY): Payer: Self-pay

## 2021-08-10 ENCOUNTER — Other Ambulatory Visit: Payer: Self-pay

## 2021-08-10 MED ORDER — PROMETHAZINE HCL 25 MG PO TABS
25.0000 mg | ORAL_TABLET | Freq: Three times a day (TID) | ORAL | 0 refills | Status: DC | PRN
Start: 2021-08-10 — End: 2021-09-08

## 2021-08-12 ENCOUNTER — Ambulatory Visit: Payer: No Typology Code available for payment source

## 2021-08-12 NOTE — Chronic Care Management (AMB) (Signed)
   08/12/2021  [name] March 13, 1990 [id number]  Patient with scheduled appointment today for care coordination. Placed call to patient who answered and states that she is at [facility] ED with an abdominal infection and pain.  Offered to reschedule a follow up appointment and patient agreed to 08/19/2021.   Tomasa Rand RN, BSN, CEN RN Case Freight forwarder - [facility] [employer] officer Mobile: [phone]

## 2021-08-19 ENCOUNTER — Ambulatory Visit: Payer: No Typology Code available for payment source

## 2021-08-19 VITALS — Wt 182.0 lb

## 2021-08-19 DIAGNOSIS — I10 Essential (primary) hypertension: Secondary | ICD-10-CM

## 2021-08-19 DIAGNOSIS — E1169 Type 2 diabetes mellitus with other specified complication: Secondary | ICD-10-CM

## 2021-08-19 NOTE — Chronic Care Management (AMB) (Signed)
Care Management    RN Visit Note  08/19/2021 Name: Michelle Lamb MRN: [id number] DOB: [birth year]  Subjective: Tiandra Swoveland is a 31 y.o. year old female who is a primary care patient of [name], [name]. The care management team was consulted for assistance with disease management and care coordination needs.    Engaged with patient by telephone for follow up visit in response to provider referral for case management and/or care coordination services.   Consent to Services:   Ms. Liszewski was given information about Care Management services today including:  Care Management services includes personalized support from designated clinical staff supervised by her physician, including individualized plan of care and coordination with other care providers 24/7 contact phone numbers for assistance for urgent and routine care needs. The patient may stop case management services at any time by phone call to the office staff.  Patient agreed to services and consent obtained.   Assessment: Review of patient past medical history, allergies, medications, health status, including review of consultants reports, laboratory and other test data, was performed as part of comprehensive evaluation and provision of chronic care management services.   SDOH (Social Determinants of Health) assessments and interventions performed:    Care Plan  No Known Allergies  Outpatient Encounter Medications as of 08/19/2021  Medication Sig   albuterol (VENTOLIN HFA) 108 (90 Base) MCG/ACT inhaler Inhale 2 puffs into the lungs every 6 (six) hours as needed for wheezing or shortness of breath.   budesonide-formoterol (SYMBICORT) 80-4.5 MCG/ACT inhaler Inhale 2 puffs into the lungs daily as needed (wheezing).   Cholecalciferol 1.25 MG (50000 UT) capsule Take 1 po twice weekly (Patient taking differently: Take 50,000 Units by mouth 2 (two) times a week. Tuesdays & Thursdays)   docusate sodium (COLACE) 100 MG capsule Take 1  capsule (100 mg total) by mouth 2 (two) times daily. (Patient taking differently: Take 100 mg by mouth daily.)   gabapentin (NEURONTIN) 100 MG capsule Take 100 mg by mouth daily as needed (pain).   HYDROcodone-acetaminophen (NORCO/VICODIN) 5-325 MG tablet Take 1 - 2 tablets by mouth every 6 hours as needed.   LORazepam (ATIVAN) 1 MG tablet Take 1 tablet (1 mg total) by mouth daily as needed. (Patient not taking: Reported on 07/22/2021)   omeprazole (PRILOSEC) 20 MG capsule Take twice daily while taking prednisone (Patient taking differently: Take 20 mg by mouth in the morning.)   ondansetron (ZOFRAN-ODT) 4 MG disintegrating tablet Take 1 tablet (4 mg total) by mouth every 8 (eight) hours as needed for nausea or vomiting.   oxyCODONE-acetaminophen (PERCOCET/ROXICET) 5-325 MG tablet Take 1 tablet by mouth every 6 (six) hours as needed for severe pain.   promethazine (PHENERGAN) 25 MG tablet Take 1 tablet (25 mg total) by mouth every 8 (eight) hours as needed for nausea or vomiting.   Semaglutide,0.25 or 0.5MG/DOS, (OZEMPIC, 0.25 OR 0.5 MG/DOSE,) 2 MG/3ML SOPN Inject 0.5 mg into the skin once a week.   No facility-administered encounter medications on file as of 08/19/2021.    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   LUQ abdominal pain 12/24/2020   Dehydration 12/24/2020   Acute cystitis without hematuria 12/22/2020   Malaise 12/22/2020   Influenza A 12/14/2020   Vitamin D insufficiency 09/30/2020   Weight loss 09/30/2020   Mixed hyperlipidemia 09/30/2020   Anxiety with depression 09/23/2020   Morbid obesity ([place]) 06/02/2020   History  of Roux-en-Y gastric bypass 06/02/2020   Asthma 05/22/2020   Obstructive sleep apnea 10/29/2019   Hypertension, benign essential, goal below 140/90 06/20/2019   Diabetes mellitus type 2 in obese ([place]) 06/20/2019   BMI 50.0-59.9, adult ([place]) 12/28/2015   History of  herpes simplex infection (serology only) 12/28/2015    Conditions to be addressed/monitored: HTN and DMII  Care Plan : RN Care Manager Plan of Care  Updates made by Thana Ates, RN since 08/19/2021 12:00 AM     Problem: Health Promotion or Disease Self-Management (General Plan of Care)      Goal: Chronic Disease Management and Care Coordination Needs (HTN, DM2)   Start Date: 07/22/2021  Recent Progress: On track  Note:   Transition Care Management Follow-up Telephone Call Date of discharge and from where[facility]21 How have you been since you were released from the hospital? Patient notes that she has been doing ok, however she woke up this morning and she has a bit of blurry visions and dizziness.  Inquired if she had taken her CBG reading and it was 69.  She is also having a bit more pain this morning and she felt this was related to "overdoing it".  Asked if she had taken her blood pressure and her machine is broken,  Her mother is suppose to get another for her today. Patient is only taking 1/2 Hydrocodone 5/325 every 4-6 hours. Encouraged patient to call her surgeon and inform them of her symptoms and if she could not reach them she should call the clinic to get in to be seen. Any questions or concerns? Yes (see above)  Items Reviewed: Did the pt receive and understand the discharge instructions provided? Yes  Medications obtained and verified? Yes  Other? No  Any new allergies since your discharge? No  Dietary orders reviewed? Yes Do you have support at home? Yes - Mother assists, patient has a 63 year old son, [name].  Home Care and Equipment/Supplies: Were home health services ordered? no If so, what is the name of the agency? N/A  Has the agency set up a time to come to the patient's home? not applicable Were any new equipment or medical supplies ordered?  No What is the name of the medical supply agency? N/A Were you able to get the supplies/equipment?  not applicable Do you have any questions related to the use of the equipment or supplies? No  Functional Questionnaire: (I = Independent and D = Dependent) ADLs: I Bathing/Dressing- I  Meal Prep- I  Eating- I  Maintaining continence- I  Transferring/Ambulation- I  Managing Meds- I  Follow up appointments reviewed:  PCP Hospital f/u appt confirmed? No - Patient to call and schedule within the next week Specialist Hospital f/u appt confirmed? Yes  Scheduled to see urology on 08/03/21_0 :30. Are transportation arrangements needed? No  If their condition worsens, is the pt aware to call PCP or go to the Emergency Dept.? Yes Was the patient provided with contact information for the PCP's office or ED? Yes Was to pt encouraged to call back with questions or concerns? Yes  Current Barriers:  Chronic Disease Management support and education needs related to HTN and DMII  08/19/2021  Spoke with patient who reports that she is doing well. Reports she is still on leave of absence due to kidney surgery.  States DM under good control. Reports CBG in the 70's in the am. Reports she is taking all her medications as prescribed. Reports  her kidneys are doing well as far as she knows. She works for [employer] and has easy access to health care when needed.  Reports taking all medications as prescribed. Reports recent GI infection and has recovered from this .   RNCM Clinical Goal(s):  Patient will take all medications exactly as prescribed and will call provider for medication related questions as evidenced by Discussion with RN Care Coordinator continue to work with RN Care Manager to address care management and care coordination needs related to  HTN and DMII as evidenced by adherence to CM Team Scheduled appointments through collaboration with RN Care manager, provider, and care team.   Interventions: 1:1 collaboration with primary care provider regarding development and update of  comprehensive plan of care as evidenced by provider attestation and co-signature Inter-disciplinary care team collaboration (see longitudinal plan of care) Evaluation of current treatment plan related to  self management and patient's adherence to plan as established by provider   Diabetes Interventions:  (Status:  Goal Met.) Long Term Goal Assessed patient's understanding of A1c goal: <7% Reviewed medications with patient and discussed importance of medication adherence Discussed plans with patient for ongoing care management follow up and provided patient with direct contact information for care management team Review of patient status, including review of consultants reports, relevant laboratory and other test results, and medications completed Lab Results  Component Value Date   HGBA1C 5.1 07/07/2021  Goals met. Patient denies any needs at this time.  Hypertension Interventions:  (Status:  Goal Met.) Long Term Goal Last practice recorded BP readings:  BP Readings from Last 3 Encounters:  07/18/21 115/66  07/07/21 138/81  06/04/21 122/81  Most recent eGFR/CrCl:  Lab Results  Component Value Date   EGFR 110 04/09/2021      Evaluation of current treatment plan related to hypertension self management and patient's adherence to plan as established by provider Reviewed medications with patient and discussed importance of compliance Discussed plans with patient for ongoing care management follow up and provided patient with direct contact information for care management team Reviewed scheduled/upcoming provider appointments including:  Goal met. Denies any needs at this time. Patient Goals/Self-Care Activities: Take all medications as prescribed Attend all scheduled provider appointments Call pharmacy for medication refills 3-7 days in advance of running out of medications       Plan:  Reviewed with patient no pending follow up and to schedule as per MD suggestion.  Tomasa Rand RN, BSN, CEN RN Case Freight forwarder - [facility] [employer] officer Mobile: [phone]

## 2021-08-19 NOTE — Patient Instructions (Signed)
Visit Information  Thank you for taking time to visit with me today. Please don't hesitate to contact me if I can be of assistance to you before our next scheduled telephone appointment.  Following are the goals we discussed today:   Make an appointment with MD for follow up as directed.    If you are experiencing a Mental Health or [place] or need someone to talk to, please call the Suicide and Crisis Lifeline: 988 call the [place] National Suicide Prevention Lifeline: (604)161-4063 or TTY: (262)539-0965 TTY 6044503832) to talk to a trained counselor call 1-800-273-TALK (toll free, 24 hour hotline) call 911   Patient verbalizes understanding of instructions and care plan provided today and agrees to view in [place]. Active MyChart status and patient understanding of how to access instructions and care plan via MyChart confirmed with patient.     Next PCP appointment scheduled for:  as needed  Tomasa Rand RN, BSN, CEN RN Case Manager - [facility] Mobile: [phone]

## 2021-08-25 ENCOUNTER — Ambulatory Visit: Payer: No Typology Code available for payment source | Admitting: Physician Assistant

## 2021-09-05 ENCOUNTER — Telehealth: Payer: No Typology Code available for payment source | Admitting: Family Medicine

## 2021-09-05 DIAGNOSIS — J40 Bronchitis, not specified as acute or chronic: Secondary | ICD-10-CM

## 2021-09-05 MED ORDER — PROMETHAZINE-DM 6.25-15 MG/5ML PO SYRP
5.0000 mL | ORAL_SOLUTION | Freq: Four times a day (QID) | ORAL | 0 refills | Status: DC | PRN
Start: 2021-09-05 — End: 2021-09-10

## 2021-09-05 MED ORDER — AZITHROMYCIN 250 MG PO TABS: ORAL_TABLET | ORAL | 0 refills | Status: AC

## 2021-09-05 NOTE — Progress Notes (Signed)
Virtual Visit Consent   Michelle Lamb, you are scheduled for a virtual visit with a [facility] provider today. Just as with appointments in the office, your consent must be obtained to participate. Your consent will be active for this visit and any virtual visit you may have with one of our providers in the next 365 days. If you have a MyChart account, a copy of this consent can be sent to you electronically.  As this is a virtual visit, video technology does not allow for your provider to perform a traditional examination. This may limit your provider's ability to fully assess your condition. If your provider identifies any concerns that need to be evaluated in person or the need to arrange testing (such as labs, EKG, etc.), we will make arrangements to do so. Although advances in technology are sophisticated, we cannot ensure that it will always work on either your end or our end. If the connection with a video visit is poor, the visit may have to be switched to a telephone visit. With either a video or telephone visit, we are not always able to ensure that we have a secure connection.  By engaging in this virtual visit, you consent to the provision of healthcare and authorize for your insurance to be billed (if applicable) for the services provided during this visit. Depending on your insurance coverage, you may receive a charge related to this service.  I need to obtain your verbal consent now. Are you willing to proceed with your visit today? Khloei Spiker has provided verbal consent on 09/05/2021 for a virtual visit (video or telephone). Dellia Nims, FNP  Date: 09/05/2021 9:20 AM  Virtual Visit via Video Note   I, Dellia Nims, connected with  [name]  ([id number], 1990-08-20) on 09/05/21 at  9:00 AM EDT by a video-enabled telemedicine application and verified that I am speaking with the correct person using two identifiers.  Location: Patient: Virtual Visit Location Patient: Home Provider:  Virtual Visit Location Provider: Home Office   I discussed the limitations of evaluation and management by telemedicine and the availability of in person appointments. The patient expressed understanding and agreed to proceed.    History of Present Illness: Michelle Lamb is a 31 y.o. who identifies as a female who was assigned female at birth, and is being seen today for cough and sore throat for 4-5 days worsening. She denies fever. She reports prod cough with green phlegm. Reports tessalon, delsym and robitussin have not worked. Denies respiratory difficulty.   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   Asthma 05/22/2020   Obstructive sleep apnea 10/29/2019   Hypertension, benign essential, goal below 140/90 06/20/2019   Diabetes mellitus type 2 in obese ([place]) 06/20/2019   BMI 50.0-59.9, adult ([place]) 12/28/2015   History of herpes simplex infection (serology only) 12/28/2015    Allergies: No Known Allergies Medications:  Current Outpatient Medications:    azithromycin (ZITHROMAX) 250 MG tablet, Take 2 tablets on day 1, then 1 tablet daily on days  2 through 5, Disp: 6 tablet, Rfl: 0   promethazine-dextromethorphan (PROMETHAZINE-DM) 6.25-15 MG/5ML syrup, Take 5 mLs by mouth 4 (four) times daily as needed for up to 10 days for cough., Disp: 118 mL, Rfl: 0   albuterol (VENTOLIN HFA) 108 (90 Base) MCG/ACT inhaler, Inhale 2 puffs into the lungs every 6 (six) hours as needed for wheezing or shortness of breath., Disp: ,  Rfl:    budesonide-formoterol (SYMBICORT) 80-4.5 MCG/ACT inhaler, Inhale 2 puffs into the lungs daily as needed (wheezing)., Disp: , Rfl:    Cholecalciferol 1.25 MG (50000 UT) capsule, Take 1 po twice weekly (Patient taking differently: Take 50,000 Units by mouth 2 (two) times a week. Tuesdays & Thursdays), Disp: 24 capsule, Rfl: 3   docusate sodium (COLACE) 100 MG capsule, Take 1 capsule (100 mg total) by mouth 2 (two) times daily. (Patient taking differently: Take 100 mg by mouth daily.), Disp: , Rfl:    gabapentin (NEURONTIN) 100 MG capsule, Take 100 mg by mouth daily as needed (pain)., Disp: , Rfl:    HYDROcodone-acetaminophen (NORCO/VICODIN) 5-325 MG tablet, Take 1 - 2 tablets by mouth every 6 hours as needed., Disp: 20 tablet, Rfl: 0   LORazepam (ATIVAN) 1 MG tablet, Take 1 tablet (1 mg total) by mouth daily as needed. (Patient not taking: Reported on 07/22/2021), Disp: 30 tablet, Rfl: 1   omeprazole (PRILOSEC) 20 MG capsule, Take twice daily while taking prednisone (Patient taking differently: Take 20 mg by mouth in the morning.), Disp: 30 capsule, Rfl: 0   ondansetron (ZOFRAN-ODT) 4 MG disintegrating tablet, Take 1 tablet (4 mg total) by mouth every 8 (eight) hours as needed for nausea or vomiting., Disp: 90 tablet, Rfl: 1   oxyCODONE-acetaminophen (PERCOCET/ROXICET) 5-325 MG tablet, Take 1 tablet by mouth every 6 (six) hours as needed for severe pain., Disp: 15 tablet, Rfl: 0   promethazine (PHENERGAN) 25 MG tablet, Take 1 tablet (25 mg total) by mouth every 8 (eight) hours as needed for nausea or vomiting., Disp: 20 tablet, Rfl: 0   Semaglutide,0.25 or 0.'5MG'$ /DOS, (OZEMPIC, 0.25 OR 0.5 MG/DOSE,) 2 MG/3ML SOPN, Inject 0.5 mg into the skin once a week., Disp: 6 mL, Rfl: 0  Observations/Objective: Patient is well-developed, well-nourished in no acute distress.  Resting comfortably  at home.  Head is normocephalic, atraumatic.  No labored breathing.  Speech is clear and coherent with logical  content.  Patient is alert and oriented at baseline.    Assessment and Plan: 1. Bronchitis  Increase fluids, humidifier at night, tylenol or ibuprofen. Mucinex otc. Urgent care if sx persist or worsen.   Follow Up Instructions: I discussed the assessment and treatment plan with the patient. The patient was provided an opportunity to ask questions and all were answered. The patient agreed with the plan and demonstrated an understanding of the instructions.  A copy of instructions were sent to the patient via MyChart unless otherwise noted below.     The patient was advised to call back or seek an in-person evaluation if the symptoms worsen or if the condition fails to improve as anticipated.  Time:  I spent 10 minutes with the patient via telehealth technology discussing the above problems/concerns.    Dellia Nims, FNP

## 2021-09-05 NOTE — Patient Instructions (Signed)
Acute Bronchitis, Adult ? ?Acute bronchitis is sudden inflammation of the main airways (bronchi) that come off the windpipe (trachea) in the lungs. The swelling causes the airways to get smaller and make more mucus than normal. This can make it hard to breathe and can cause coughing or noisy breathing (wheezing). ?Acute bronchitis may last several weeks. The cough may last longer. Allergies, asthma, and exposure to smoke may make the condition worse. ?What are the causes? ?This condition can be caused by germs and by substances that irritate the lungs, including: ?Cold and flu viruses. The most common cause of this condition is the virus that causes the common cold. ?Bacteria. This is less common. ?Breathing in substances that irritate the lungs, including: ?Smoke from cigarettes and other forms of tobacco. ?Dust and pollen. ?Fumes from household cleaning products, gases, or burned fuel. ?Indoor or outdoor air pollution. ?What increases the risk? ?The following factors may make you more likely to develop this condition: ?A weak body's defense system, also called the immune system. ?A condition that affects your lungs and breathing, such as asthma. ?What are the signs or symptoms? ?Common symptoms of this condition include: ?Coughing. This may bring up clear, yellow, or green mucus from your lungs (sputum). ?Wheezing. ?Runny or stuffy nose. ?Having too much mucus in your lungs (chest congestion). ?Shortness of breath. ?Aches and pains, including sore throat or chest. ?How is this diagnosed? ?This condition is usually diagnosed based on: ?Your symptoms and medical history. ?A physical exam. ?You may also have other tests, including tests to rule out other conditions, such as pneumonia. These tests include: ?A test of lung function. ?Test of a mucus sample to look for the presence of bacteria. ?Tests to check the oxygen level in your blood. ?Blood tests. ?Chest X-ray. ?How is this treated? ?Most cases of acute  bronchitis clear up over time without treatment. Your health care provider may recommend: ?Drinking more fluids to help thin your mucus so it is easier to cough up. ?Taking inhaled medicine (inhaler) to improve air flow in and out of your lungs. ?Using a vaporizer or a humidifier. These are machines that add water to the air to help you breathe better. ?Taking a medicine that thins mucus and clears congestion (expectorant). ?Taking a medicine that prevents or stops coughing (cough suppressant). ?It is notcommon to take an antibiotic medicine for this condition. ?Follow these instructions at home: ? ?Take over-the-counter and prescription medicines only as told by your health care provider. ?Use an inhaler, vaporizer, or humidifier as told by your health care provider. ?Take two teaspoons (10 mL) of honey at bedtime to lessen coughing at night. ?Drink enough fluid to keep your urine pale yellow. ?Do not use any products that contain nicotine or tobacco. These products include cigarettes, chewing tobacco, and vaping devices, such as e-cigarettes. If you need help quitting, ask your health care provider. ?Get plenty of rest. ?Return to your normal activities as told by your health care provider. Ask your health care provider what activities are safe for you. ?Keep all follow-up visits. This is important. ?How is this prevented? ?To lower your risk of getting this condition again: ?Wash your hands often with soap and water for at least 20 seconds. If soap and water are not available, use hand sanitizer. ?Avoid contact with people who have cold symptoms. ?Try not to touch your mouth, nose, or eyes with your hands. ?Avoid breathing in smoke or chemical fumes. Breathing smoke or chemical fumes will make your   condition worse. ?Get the flu shot every year. ?Contact a health care provider if: ?Your symptoms do not improve after 2 weeks. ?You have trouble coughing up the mucus. ?Your cough keeps you awake at night. ?You have a  fever. ?Get help right away if you: ?Cough up blood. ?Feel pain in your chest. ?Have severe shortness of breath. ?Faint or keep feeling like you are going to faint. ?Have a severe headache. ?Have a fever or chills that get worse. ?These symptoms may represent a serious problem that is an emergency. Do not wait to see if the symptoms will go away. Get medical help right away. Call your local emergency services (911 in the [place] Do not drive yourself to the hospital. ?Summary ?Acute bronchitis is inflammation of the main airways (bronchi) that come off the windpipe (trachea) in the lungs. The swelling causes the airways to get smaller and make more mucus than normal. ?Drinking more fluids can help thin your mucus so it is easier to cough up. ?Take over-the-counter and prescription medicines only as told by your health care provider. ?Do not use any products that contain nicotine or tobacco. These products include cigarettes, chewing tobacco, and vaping devices, such as e-cigarettes. If you need help quitting, ask your health care provider. ?Contact a health care provider if your symptoms do not improve after 2 weeks. ?This information is not intended to replace advice given to you by your health care provider. Make sure you discuss any questions you have with your health care provider. ?Document Revised: 06/17/2020 Document Reviewed: 06/17/2020 ?Elsevier Patient Education ? 2023 [employer] Inc[employer] ? ?

## 2021-09-07 ENCOUNTER — Other Ambulatory Visit: Payer: Self-pay | Admitting: Physician Assistant

## 2021-09-07 DIAGNOSIS — E782 Mixed hyperlipidemia: Secondary | ICD-10-CM

## 2021-09-07 DIAGNOSIS — R5383 Other fatigue: Secondary | ICD-10-CM

## 2021-09-07 DIAGNOSIS — E559 Vitamin D deficiency, unspecified: Secondary | ICD-10-CM

## 2021-09-07 DIAGNOSIS — I10 Essential (primary) hypertension: Secondary | ICD-10-CM

## 2021-09-07 DIAGNOSIS — E1169 Type 2 diabetes mellitus with other specified complication: Secondary | ICD-10-CM

## 2021-09-08 ENCOUNTER — Ambulatory Visit (INDEPENDENT_AMBULATORY_CARE_PROVIDER_SITE_OTHER): Payer: No Typology Code available for payment source | Admitting: Physician Assistant

## 2021-09-08 ENCOUNTER — Encounter: Payer: Self-pay | Admitting: Physician Assistant

## 2021-09-08 VITALS — BP 110/76 | HR 90 | Temp 97.5°F | Ht 63.0 in | Wt 181.6 lb

## 2021-09-08 DIAGNOSIS — K760 Fatty (change of) liver, not elsewhere classified: Secondary | ICD-10-CM

## 2021-09-08 DIAGNOSIS — D49511 Neoplasm of unspecified behavior of right kidney: Secondary | ICD-10-CM | POA: Diagnosis not present

## 2021-09-08 DIAGNOSIS — E782 Mixed hyperlipidemia: Secondary | ICD-10-CM

## 2021-09-08 DIAGNOSIS — F900 Attention-deficit hyperactivity disorder, predominantly inattentive type: Secondary | ICD-10-CM

## 2021-09-08 DIAGNOSIS — J060 Acute laryngopharyngitis: Secondary | ICD-10-CM | POA: Insufficient documentation

## 2021-09-08 DIAGNOSIS — K219 Gastro-esophageal reflux disease without esophagitis: Secondary | ICD-10-CM

## 2021-09-08 DIAGNOSIS — E559 Vitamin D deficiency, unspecified: Secondary | ICD-10-CM

## 2021-09-08 HISTORY — DX: Attention-deficit hyperactivity disorder, predominantly inattentive type: F90.0

## 2021-09-08 HISTORY — DX: Gastro-esophageal reflux disease without esophagitis: K21.9

## 2021-09-08 HISTORY — DX: Fatty (change of) liver, not elsewhere classified: K76.0

## 2021-09-08 HISTORY — DX: Acute laryngopharyngitis: J06.0

## 2021-09-08 MED ORDER — ALBUTEROL SULFATE HFA 108 (90 BASE) MCG/ACT IN AERS: 2.0000 | INHALATION_SPRAY | Freq: Four times a day (QID) | RESPIRATORY_TRACT | 5 refills | Status: AC | PRN

## 2021-09-08 MED ORDER — LISDEXAMFETAMINE DIMESYLATE 20 MG PO CAPS: 20.0000 mg | ORAL_CAPSULE | Freq: Every day | ORAL | 0 refills | Status: DC

## 2021-09-08 NOTE — Progress Notes (Signed)
Subjective:  Patient ID: Michelle Lamb, female    DOB: [birth year]  Age: 31 y.o. MRN: [id number]  Chief Complaint  Patient presents with   Follow-up    HPI  Pt with history of right kidney neoplasm - she had surgery done for removal of that lesion on 03/15/21 by urologist Dr Raynelle Bring - she had complications after that surgery She started having pain on her right side and eventually found out that her right kidney was not draining and the right ureter was not functioning - had drain tube placement done A surgery on 07/15/21 proved the problem was that actually a surgical clip was left on the ureter with her initial surgery and that was removed Pt will need follow up and would actually like referral to nephrologist  Pt with current URI symptoms - is using albuterol and taking a Zpack - states she is overall feeling better today with minimal congestion  Pt with history of hyperlipidemia - at this time is diet controlled and not on medication - is due for labwork  Pt with history of GERD - stable on omeprazole '20mg'$  qd  Pt with history of obesity however did have gastric bypass surgery 4/22 and has lost over 90 pounds -   Pt with history of hepatic steatosis.  Had a liver MRI 3/23 which showed improvement of lesions seen on prior exam and consistent with benign findings.  She is currently on Ozempic '5mg'$  weekly for treatment.  Pt states that she has had issues with concentration and focusing for awhile but has been able to manage without medication.  Since being at home for long period then coming back to work she is having hard time focusing and concentrating to where it is a daily bother Current Outpatient Medications on File Prior to Visit  Medication Sig Dispense Refill   azithromycin (ZITHROMAX) 250 MG tablet Take 2 tablets on day 1, then 1 tablet daily on days 2 through 5 6 tablet 0   budesonide-formoterol (SYMBICORT) 80-4.5 MCG/ACT inhaler Inhale 2 puffs into the lungs daily as needed  (wheezing).     Cholecalciferol 1.25 MG (50000 UT) capsule Take 1 po twice weekly (Patient taking differently: Take 50,000 Units by mouth 2 (two) times a week. Tuesdays & Thursdays) 24 capsule 3   docusate sodium (COLACE) 100 MG capsule Take 1 capsule (100 mg total) by mouth 2 (two) times daily. (Patient taking differently: Take 100 mg by mouth daily.)     LORazepam (ATIVAN) 1 MG tablet Take 1 tablet (1 mg total) by mouth daily as needed. 30 tablet 1   omeprazole (PRILOSEC) 20 MG capsule Take twice daily while taking prednisone (Patient taking differently: Take 20 mg by mouth in the morning.) 30 capsule 0   ondansetron (ZOFRAN-ODT) 4 MG disintegrating tablet Take 1 tablet (4 mg total) by mouth every 8 (eight) hours as needed for nausea or vomiting. 90 tablet 1   oxyCODONE-acetaminophen (PERCOCET/ROXICET) 5-325 MG tablet Take 1 tablet by mouth every 6 (six) hours as needed for severe pain. 15 tablet 0   promethazine-dextromethorphan (PROMETHAZINE-DM) 6.25-15 MG/5ML syrup Take 5 mLs by mouth 4 (four) times daily as needed for up to 10 days for cough. 118 mL 0   Semaglutide,0.25 or 0.'5MG'$ /DOS, (OZEMPIC, 0.25 OR 0.5 MG/DOSE,) 2 MG/3ML SOPN Inject 0.5 mg into the skin once a week. 6 mL 0   No current facility-administered medications on file prior to visit.   Past Medical History:  Diagnosis Date   Anxiety  Asthma    Chronic kidney disease    Complication of anesthesia    Slow to wake up   Depression    Diabetes mellitus without complication ([place])    TYPE 2 LAST DOSE FRIDAY OCTOBER 27 ([facility]FORMIN)   Fatty liver    GERD (gastroesophageal reflux disease)    History of herpes simplex infection    Hyperlipidemia    Hypertension    no meds since gastric   Seasonal allergies    Sleep apnea    Vaginal delivery 2009, 2010, 2015   Vitamin D deficiency    Past Surgical History:  Procedure Laterality Date   CESAREAN SECTION  09/29/2016   CHOLECYSTECTOMY     CYSTOSCOPY W/ URETERAL STENT PLACEMENT  Right 07/15/2021   Procedure: CYSTOSCOPY WITH RETROGRADE PYELOGRAM/URETERAL STENT PLACEMENT;  Surgeon: Raynelle Bring, MD;  Location: [facility];  Service: Urology;  Laterality: Right;   CYSTOSCOPY WITH RETROGRADE PYELOGRAM, URETEROSCOPY AND STENT PLACEMENT Right 05/03/2021   Procedure: CYSTOSCOPY WITH RIGHT RETROGRADE PYELOGRAM, URETEROSCOPY;  Surgeon: Raynelle Bring, MD;  Location: [facility];  Service: Urology;  Laterality: Right;   DILATION AND CURETTAGE OF UTERUS N/A 12/31/2015   Procedure: SUCTION DILATATION AND CURETTAGE;  Surgeon: Aletha Halim, MD;  Location: [facility];  Service: Gynecology;  Laterality: N/A;   DILATION AND EVACUATION N/A 01/01/2016   Procedure: DILATATION AND EVACUATION;  Surgeon: Jonnie Kind, MD;  Location: [facility];  Service: Gynecology;  Laterality: N/A;   ESOPHAGOGASTRODUODENOSCOPY     GASTRIC BYPASS  06/02/2020   IR NEPHRO TUBE REMOV/FL  07/16/2021   IR NEPHROSTOMY EXCHANGE RIGHT  05/12/2021   IR NEPHROSTOMY PLACEMENT RIGHT  05/05/2021   ROBOT ASSISTED PYELOPLASTY Right 07/15/2021   Procedure: XI ROBOTIC ASSISTED  LAPAROSCOPIC PYELOPLASTY;  Surgeon: Raynelle Bring, MD;  Location: [facility];  Service: Urology;  Laterality: Right;   ROBOTIC ASSITED PARTIAL NEPHRECTOMY Right 03/15/2021   Procedure: XI ROBOTIC ASSITED PARTIAL NEPHRECTOMY;  Surgeon: Raynelle Bring, MD;  Location: [facility];  Service: Urology;  Laterality: Right;   TONSILLECTOMY     TUBAL LIGATION      Family History  Problem Relation Age of Onset   Hypertension Mother    Cancer Maternal Grandmother        stomach cancer   Depression Maternal Grandfather    Cancer Maternal Grandfather        leukemia   Depression Paternal Grandmother    Social History   Socioeconomic History   Marital status: Married    Spouse name: Not on file   Number of children: 4   Years of education: Not on file   Highest education level: Not on file  Occupational History   Occupation: Physiological scientist  Tobacco Use    Smoking status: Never   Smokeless tobacco: Never  Vaping Use   Vaping Use: Never used  Substance and Sexual Activity   Alcohol use: Not Currently    Comment: OCCASIONAL   Drug use: No   Sexual activity: Yes    Birth control/protection: Surgical  Other Topics Concern   Not on file  Social History Narrative   Not on file   Social Determinants of Health   Financial Resource Strain: Not on file  Food Insecurity: Not on file  Transportation Needs: Not on file  Physical Activity: Not on file  Stress: Not on file  Social Connections: Not on file    Review of Systems CONSTITUTIONAL: Negative for chills, fatigue, fever, unintentional weight gain and unintentional weight loss.  E/N/T: see HPI CARDIOVASCULAR: Negative for chest pain, dizziness, palpitations and pedal edema.  RESPIRATORY: Negative for recent cough and dyspnea.  GASTROINTESTINAL: Negative for abdominal pain, acid reflux symptoms, constipation, diarrhea, nausea and vomiting.  MSK: Negative for arthralgias and myalgias.  INTEGUMENTARY: Negative for rash.  NEUROLOGICAL: Negative for dizziness and headaches.  PSYCHIATRIC: Negative for sleep disturbance and to question depression screen.  Negative for depression, negative for anhedonia.       Objective:  PHYSICAL EXAM:   VS: BP 110/76 (BP Location: Right Arm, Patient Position: Sitting, Cuff Size: Normal)   Pulse 90   Temp (!) 97.5 F (36.4 C) (Temporal)   Ht '5\' 3"'$  (1.6 m)   Wt 181 lb 9.6 oz (82.4 kg)   SpO2 98%   BMI 32.17 kg/m   GEN: Well nourished, well developed, in no acute distress   Cardiac: RRR; no murmurs, rubs, or gallops,no edema  Respiratory:  normal respiratory rate and pattern with no distress - normal breath sounds with no rales, rhonchi, wheezes or rubs  MS: no deformity or atrophy  Skin: warm and dry, no rash  Neuro:  Alert and Oriented x 3, Strength and sensation are intact - CN II-Xii grossly intact Psych: euthymic mood, appropriate affect  and demeanor  Lab Results  Component Value Date   WBC 9.3 07/07/2021   HGB 9.2 (L) 07/17/2021   HCT 28.6 (L) 07/17/2021   PLT 368 07/07/2021   GLUCOSE 77 07/17/2021   CHOL 195 04/09/2021   TRIG 103 04/09/2021   HDL 63 04/09/2021   LDLCALC 114 (H) 04/09/2021   ALT 19 06/04/2021   AST 17 06/04/2021   NA 139 07/17/2021   K 4.6 07/17/2021   CL 110 07/17/2021   CREATININE 0.69 07/17/2021   BUN 17 07/17/2021   CO2 25 07/17/2021   TSH 0.697 04/09/2021   INR 1.0 05/05/2021   HGBA1C 5.1 07/07/2021      Assessment & Plan:   Problem List Items Addressed This Visit       Respiratory   Acute laryngopharyngitis Finish meds as directed     Digestive   Hepatic steatosis Continue ozempic Watch diet   Gastroesophageal reflux disease without esophagitis Continue omeprazole     Genitourinary   Neoplasm of right kidney - Primary   Relevant Orders   Ambulatory referral to Nephrology     Other   Vitamin D insufficiency Labwork pending   Mixed hyperlipidemia Labwork pending  ADD Triall Vyvanse '20mg'$  qd  .  Meds ordered this encounter  Medications   lisdexamfetamine (VYVANSE) 20 MG capsule    Sig: Take 1 capsule (20 mg total) by mouth daily.    Dispense:  30 capsule    Refill:  0    Order Specific Question:   Supervising Provider    Answer:   Shelton Silvas   albuterol (VENTOLIN HFA) 108 (90 Base) MCG/ACT inhaler    Sig: Inhale 2 puffs into the lungs every 6 (six) hours as needed for wheezing or shortness of breath.    Dispense:  1 each    Refill:  5    Order Specific Question:   Supervising Provider    AnswerRochel Brome [phone]    Orders Placed This Encounter  Procedures   Ambulatory referral to Nephrology     Follow-up: Return in about 4 weeks (around 10/06/2021) for follow up.  An After Visit Summary was printed and given to the patient.  SARA R Latrail Pounders, PA-C [facility] [phone]

## 2021-09-09 ENCOUNTER — Other Ambulatory Visit: Payer: Self-pay | Admitting: Physician Assistant

## 2021-09-09 ENCOUNTER — Other Ambulatory Visit: Payer: Self-pay | Admitting: Hematology and Oncology

## 2021-09-09 DIAGNOSIS — E559 Vitamin D deficiency, unspecified: Secondary | ICD-10-CM

## 2021-09-09 DIAGNOSIS — D513 Other dietary vitamin B12 deficiency anemia: Secondary | ICD-10-CM

## 2021-09-09 DIAGNOSIS — D509 Iron deficiency anemia, unspecified: Secondary | ICD-10-CM

## 2021-09-09 DIAGNOSIS — D508 Other iron deficiency anemias: Secondary | ICD-10-CM

## 2021-09-09 LAB — COMPREHENSIVE METABOLIC PANEL
ALT: 21 IU/L (ref 0–32)
AST: 16 IU/L (ref 0–40)
Albumin/Globulin Ratio: 1.9 (ref 1.2–2.2)
Albumin: 4.1 g/dL (ref 3.9–4.9)
Alkaline Phosphatase: 104 IU/L (ref 44–121)
BUN/Creatinine Ratio: 19 (ref 9–23)
BUN: 11 mg/dL (ref 6–20)
Bilirubin Total: 0.6 mg/dL (ref 0.0–1.2)
CO2: 21 mmol/L (ref 20–29)
Calcium: 9.1 mg/dL (ref 8.7–10.2)
Chloride: 103 mmol/L (ref 96–106)
Creatinine, Ser: 0.59 mg/dL (ref 0.57–1.00)
Globulin, Total: 2.2 g/dL (ref 1.5–4.5)
Glucose: 78 mg/dL (ref 70–99)
Potassium: 3.7 mmol/L (ref 3.5–5.2)
Sodium: 140 mmol/L (ref 134–144)
Total Protein: 6.3 g/dL (ref 6.0–8.5)
eGFR: 123 mL/min/{1.73_m2} (ref >59)

## 2021-09-09 LAB — CBC WITH DIFFERENTIAL/PLATELET
Basophils Absolute: 0.1 10*3/uL (ref 0.0–0.2)
Basos: 1 %
EOS (ABSOLUTE): 0.2 10*3/uL (ref 0.0–0.4)
Eos: 3 %
Hematocrit: 32.7 % — ABNORMAL LOW (ref 34.0–46.6)
Hemoglobin: 10.4 g/dL — ABNORMAL LOW (ref 11.1–15.9)
Immature Grans (Abs): 0 10*3/uL (ref 0.0–0.1)
Immature Granulocytes: 0 %
Lymphocytes Absolute: 3.3 10*3/uL — ABNORMAL HIGH (ref 0.7–3.1)
Lymphs: 34 %
MCH: 26 pg — ABNORMAL LOW (ref 26.6–33.0)
MCHC: 31.8 g/dL (ref 31.5–35.7)
MCV: 82 fL (ref 79–97)
Monocytes Absolute: 0.7 10*3/uL (ref 0.1–0.9)
Monocytes: 8 %
Neutrophils Absolute: 5.2 10*3/uL (ref 1.4–7.0)
Neutrophils: 54 %
Platelets: 334 10*3/uL (ref 150–450)
RBC: 4 x10E6/uL (ref 3.77–5.28)
RDW: 14.9 % (ref 11.7–15.4)
WBC: 9.6 10*3/uL (ref 3.4–10.8)

## 2021-09-09 LAB — VITAMIN D 25 HYDROXY (VIT D DEFICIENCY, FRACTURES): Vit D, 25-Hydroxy: 9.1 ng/mL — ABNORMAL LOW (ref 30.0–100.0)

## 2021-09-09 LAB — IRON,TIBC AND FERRITIN PANEL
Ferritin: 13 ng/mL — ABNORMAL LOW (ref 15–150)
Iron Saturation: 7 % — CL (ref 15–55)
Iron: 23 ug/dL — ABNORMAL LOW (ref 27–159)
Total Iron Binding Capacity: 344 ug/dL (ref 250–450)
UIBC: 321 ug/dL (ref 131–425)

## 2021-09-09 LAB — LIPID PANEL
Chol/HDL Ratio: 2.7 ratio (ref 0.0–4.4)
Cholesterol, Total: 162 mg/dL (ref 100–199)
HDL: 59 mg/dL (ref >39)
LDL Chol Calc (NIH): 91 mg/dL (ref 0–99)
Triglycerides: 60 mg/dL (ref 0–149)
VLDL Cholesterol Cal: 12 mg/dL (ref 5–40)

## 2021-09-09 LAB — B12 AND FOLATE PANEL
Folate: 5.2 ng/mL (ref >3.0)
Vitamin B-12: 284 pg/mL (ref 232–1245)

## 2021-09-09 LAB — CARDIOVASCULAR RISK ASSESSMENT

## 2021-09-09 LAB — TSH: TSH: 1.26 u[IU]/mL (ref 0.450–4.500)

## 2021-09-09 MED ORDER — VITAMIN D (ERGOCALCIFEROL) 1.25 MG (50000 UNIT) PO CAPS: ORAL_CAPSULE | ORAL | 1 refills | Status: DC

## 2021-09-09 MED ORDER — CYANOCOBALAMIN 1000 MCG/ML IJ SOLN: INTRAMUSCULAR | 2 refills | Status: DC

## 2021-09-10 ENCOUNTER — Encounter: Payer: Self-pay | Admitting: Hematology and Oncology

## 2021-09-10 ENCOUNTER — Inpatient Hospital Stay
Payer: No Typology Code available for payment source | Attending: Hematology and Oncology | Admitting: Hematology and Oncology

## 2021-09-10 ENCOUNTER — Inpatient Hospital Stay: Payer: No Typology Code available for payment source

## 2021-09-10 VITALS — BP 137/83 | HR 72 | Temp 98.0°F | Resp 20 | Ht 63.0 in | Wt 183.9 lb

## 2021-09-10 DIAGNOSIS — D509 Iron deficiency anemia, unspecified: Secondary | ICD-10-CM | POA: Insufficient documentation

## 2021-09-10 DIAGNOSIS — Z800 Family history of malignant neoplasm of digestive organs: Secondary | ICD-10-CM

## 2021-09-10 DIAGNOSIS — I129 Hypertensive chronic kidney disease with stage 1 through stage 4 chronic kidney disease, or unspecified chronic kidney disease: Secondary | ICD-10-CM | POA: Diagnosis not present

## 2021-09-10 DIAGNOSIS — N189 Chronic kidney disease, unspecified: Secondary | ICD-10-CM

## 2021-09-10 DIAGNOSIS — E1122 Type 2 diabetes mellitus with diabetic chronic kidney disease: Secondary | ICD-10-CM

## 2021-09-10 DIAGNOSIS — Z806 Family history of leukemia: Secondary | ICD-10-CM

## 2021-09-10 LAB — CBC
MCV: 80 — AB (ref 81–99)
RBC: 3.9 (ref 3.87–5.11)

## 2021-09-10 LAB — CBC AND DIFFERENTIAL
HCT: 32 — AB (ref 36–46)
Hemoglobin: 10 — AB (ref 12.0–16.0)
Neutrophils Absolute: 6.03
Platelets: 301 10*3/uL (ref 150–400)
WBC: 10.4

## 2021-09-10 NOTE — Progress Notes (Unsigned)
[facility]  [address] [place],  [place]  [id number] [phone]  Clinic Day:  09/10/2021  Referring physician: [name], PA-C   REASON FOR CONSULTATION:  Iron deficiency anemia  HISTORY OF PRESENT ILLNESS:  Michelle Lamb is a 31 y.o. female with iron deficiency anemia who is referred in consultation by [name], PA-C for assessment and management.  Iron studies July 12 were consistent with iron deficiency, with a total iron binding capacity of 344 mcg/dL, serum iron of 7 mcg/dL, iron saturation 7% and ferritin 13 ng/mL.  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  She has been taking bariatric vitamins with iron.  She reports regular monthly menses, but not heavy.  Her last menses was 2 weeks ago.  She denies hematuria, melena or hematochezia.  She had a partial nephrectomy earlier this year for a benign lesion and had to go back for ureteral obstruction in May.  She was previously anemic in 2018 during pregnancy and childbirth.  In addition to above she has hepatic steatosis, hypertension, hyperlipidemia, diabetes, asthma, GERD, sleep apnea, anxiety/depression ADHD.  She is gravida 5, para 4 with 1 miscarriage.  She has a maternal grandmother with stomach or colon cancer after age 46.  Her maternal grandfather had leukemia in his 42's, and a maternal great uncle had colon cancer after age 64.  Her maternal great grandfather and grandmother died of unknown cancer.    REVIEW OF SYSTEMS:  Review of Systems  Constitutional:  Positive for fatigue. Negative for appetite change, chills, fever and unexpected weight change.  HENT:   Negative for lump/mass, mouth sores and sore throat.   Respiratory:  Negative for cough and shortness of breath.   Cardiovascular:  Negative for chest pain and leg swelling.  Gastrointestinal:  Negative for abdominal distention, abdominal pain, blood in stool, constipation, diarrhea, nausea, rectal pain and vomiting.   Endocrine: Negative for hot flashes.  Genitourinary:  Negative for difficulty urinating, dysuria, frequency and hematuria.   Musculoskeletal:  Negative for arthralgias, back pain and myalgias.  Skin:  Negative for rash.  Neurological:  Negative for dizziness and headaches.  Hematological:  Negative for adenopathy. Does not bruise/bleed easily.  Psychiatric/Behavioral:  Positive for depression. Negative for sleep disturbance. The patient is nervous/anxious.      VITALS:  Blood pressure 137/83, pulse 72, temperature 98 F (36.7 C), temperature source Oral, resp. rate 20, height '5\' 3"'$  (1.6 m), weight 183 lb 14.4 oz (83.4 kg), SpO2 100 %.  Wt Readings from Last 3 Encounters:  09/10/21 183 lb 14.4 oz (83.4 kg)  09/08/21 181 lb 9.6 oz (82.4 kg)  08/19/21 182 lb (82.6 kg)    Body mass index is 32.58 kg/m.  Performance status (ECOG): 1 - Symptomatic but completely ambulatory  PHYSICAL EXAM:  Physical Exam Vitals and nursing note reviewed.  Constitutional:      General: She is not in acute distress.    Appearance: Normal appearance.  HENT:     Head: Normocephalic and atraumatic.     Mouth/Throat:     Mouth: Mucous membranes are moist.     Pharynx: Oropharynx is clear. No oropharyngeal exudate or posterior oropharyngeal erythema.  Eyes:     General: No scleral icterus.    Extraocular Movements: Extraocular movements intact.     Conjunctiva/sclera: Conjunctivae normal.     Pupils: Pupils are equal, round, and reactive to light.  Cardiovascular:     Rate and Rhythm: Normal rate and regular  rhythm.     Heart sounds: Normal heart sounds. No murmur heard.    No friction rub. No gallop.  Pulmonary:     Effort: Pulmonary effort is normal.     Breath sounds: Normal breath sounds. No wheezing, rhonchi or rales.  Abdominal:     General: There is no distension.     Palpations: Abdomen is soft. There is no hepatomegaly, splenomegaly or mass.     Tenderness: There is no abdominal  tenderness.  Musculoskeletal:        General: Normal range of motion.     Cervical back: Normal range of motion and neck supple. No tenderness.     Right lower leg: No edema.     Left lower leg: No edema.  Lymphadenopathy:     Cervical: No cervical adenopathy.     Upper Body:     Right upper body: No supraclavicular or axillary adenopathy.     Left upper body: No supraclavicular or axillary adenopathy.     Lower Body: No right inguinal adenopathy. No left inguinal adenopathy.  Skin:    General: Skin is warm and dry.     Coloration: Skin is not jaundiced.     Findings: No rash.  Neurological:     Mental Status: She is alert and oriented to person, place, and time.     Cranial Nerves: No cranial nerve deficit.  Psychiatric:        Mood and Affect: Mood normal.        Behavior: Behavior normal.        Thought Content: Thought content normal.      LABS:      Latest Ref Rng & Units 09/10/2021   12:00 AM 09/08/2021   10:11 AM 07/17/2021    7:17 AM  CBC  WBC  10.4     9.6    Hemoglobin 12.0 - 16.0 10.0     10.4  9.2   Hematocrit 36 - 46 32     32.7  28.6   Platelets 150 - 400 K/uL 301     334       This result is from an external source.      Latest Ref Rng & Units 09/08/2021   10:11 AM 07/17/2021    4:11 AM 07/16/2021    6:32 AM  CMP  Glucose 70 - 99 mg/dL 78  77    BUN 6 - 20 mg/dL 11  17    Creatinine 0.57 - 1.00 mg/dL 0.59  0.69    Sodium 134 - 144 mmol/L 140  139    Potassium 3.5 - 5.2 mmol/L 3.7  4.6  4.6   Chloride 96 - 106 mmol/L 103  110    CO2 20 - 29 mmol/L 21  25    Calcium 8.7 - 10.2 mg/dL 9.1  8.6    Total Protein 6.0 - 8.5 g/dL 6.3     Total Bilirubin 0.0 - 1.2 mg/dL 0.6     Alkaline Phos 44 - 121 IU/L 104     AST 0 - 40 IU/L 16     ALT 0 - 32 IU/L 21        No results found for: "CEA1", "CEA" / No results found for: "CEA1", "CEA" No results found for: "PSA1" No results found for: "HYQ657" No results found for: "CAN125"  No results found for:  "TOTALPROTELP", "ALBUMINELP", "A1GS", "A2GS", "BETS", "BETA2SER", "GAMS", "MSPIKE", "SPEI" Lab Results  Component Value Date   TIBC  344 09/08/2021   FERRITIN 13 (L) 09/08/2021   IRONPCTSAT 7 (LL) 09/08/2021   No results found for: "LDH"  STUDIES:  No results found.    HISTORY:   Past Medical History:  Diagnosis Date   ADHD    Anxiety    Asthma    Chronic kidney disease    Complication of anesthesia    Slow to wake up   Depression    Diabetes mellitus without complication ([place])    TYPE 2 LAST DOSE FRIDAY OCTOBER 27 ([facility]FOR[facility])   Fatty liver    GERD (gastroesophageal reflux disease)    History of herpes simplex infection    Hyperlipidemia    Hypertension    no meds since gastric   Neoplasm of right kidney    Seasonal allergies    Sleep apnea    Vaginal delivery 2009, 2010, 2015   Vitamin D deficiency     Past Surgical History:  Procedure Laterality Date   CESAREAN SECTION  09/29/2016   CHOLECYSTECTOMY     CYSTOSCOPY W/ URETERAL STENT PLACEMENT Right 07/15/2021   Procedure: CYSTOSCOPY WITH RETROGRADE PYELOGRAM/URETERAL STENT PLACEMENT;  Surgeon: Raynelle Bring, MD;  Location: [facility];  Service: Urology;  Laterality: Right;   CYSTOSCOPY WITH RETROGRADE PYELOGRAM, URETEROSCOPY AND STENT PLACEMENT Right 05/03/2021   Procedure: CYSTOSCOPY WITH RIGHT RETROGRADE PYELOGRAM, URETEROSCOPY;  Surgeon: Raynelle Bring, MD;  Location: [facility];  Service: Urology;  Laterality: Right;   DILATION AND CURETTAGE OF UTERUS N/A 12/31/2015   Procedure: SUCTION DILATATION AND CURETTAGE;  Surgeon: Aletha Halim, MD;  Location: [facility];  Service: Gynecology;  Laterality: N/A;   DILATION AND EVACUATION N/A 01/01/2016   Procedure: DILATATION AND EVACUATION;  Surgeon: Jonnie Kind, MD;  Location: [facility];  Service: Gynecology;  Laterality: N/A;   ESOPHAGOGASTRODUODENOSCOPY     GASTRIC BYPASS  06/02/2020   IR NEPHRO TUBE REMOV/FL  07/16/2021   IR NEPHROSTOMY EXCHANGE RIGHT  05/12/2021   IR  NEPHROSTOMY PLACEMENT RIGHT  05/05/2021   ROBOT ASSISTED PYELOPLASTY Right 07/15/2021   Procedure: XI ROBOTIC ASSISTED  LAPAROSCOPIC PYELOPLASTY;  Surgeon: Raynelle Bring, MD;  Location: [facility];  Service: Urology;  Laterality: Right;   ROBOTIC ASSITED PARTIAL NEPHRECTOMY Right 03/15/2021   Procedure: XI ROBOTIC ASSITED PARTIAL NEPHRECTOMY;  Surgeon: Raynelle Bring, MD;  Location: [facility];  Service: Urology;  Laterality: Right;   TONSILLECTOMY     TUBAL LIGATION      Family History  Problem Relation Age of Onset   Hypertension Mother    Thyroid disease Mother    Cancer Maternal Grandmother        stomach cancer   Colon cancer Maternal Grandmother    Depression Maternal Grandfather    Cancer Maternal Grandfather        leukemia   Depression Paternal Grandmother     Social History:  reports that she has never smoked. She has never used smokeless tobacco. She reports that she does not currently use alcohol. She reports that she does not use drugs.The patient is alone today.  She is married with 4 children of her own, and is guardian to 5 other children.  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  Allergies: No Known Allergies  Current Medications: Current Outpatient Medications  Medication Sig Dispense Refill   albuterol (VENTOLIN HFA) 108 (90 Base) MCG/ACT inhaler Inhale 2 puffs into the lungs every 6 (six) hours as needed for wheezing or shortness of breath. 1 each 5   azithromycin (ZITHROMAX) 250  MG tablet Take 2 tablets on day 1, then 1 tablet daily on days 2 through 5 6 tablet 0   budesonide-formoterol (SYMBICORT) 80-4.5 MCG/ACT inhaler Inhale 2 puffs into the lungs daily as needed (wheezing).     cyanocobalamin (,VITAMIN B-12,) 1000 MCG/ML injection Inject 1 ML as directed once a month 1 mL 2   docusate sodium (COLACE) 100 MG capsule Take 1 capsule (100 mg total) by mouth 2 (two) times daily. (Patient taking differently: Take 100 mg by mouth daily.)     lisdexamfetamine (VYVANSE) 20 MG  capsule Take 1 capsule (20 mg total) by mouth daily. 30 capsule 0   LORazepam (ATIVAN) 1 MG tablet Take 1 tablet (1 mg total) by mouth daily as needed. 30 tablet 1   omeprazole (PRILOSEC) 20 MG capsule Take twice daily while taking prednisone (Patient taking differently: Take 20 mg by mouth in the morning.) 30 capsule 0   ondansetron (ZOFRAN-ODT) 4 MG disintegrating tablet Take 1 tablet (4 mg total) by mouth every 8 (eight) hours as needed for nausea or vomiting. 90 tablet 1   Semaglutide,0.25 or 0.'5MG'$ /DOS, (OZEMPIC, 0.25 OR 0.5 MG/DOSE,) 2 MG/3ML SOPN Inject 0.5 mg into the skin once a week. 6 mL 0   Vitamin D, Ergocalciferol, (DRISDOL) 1.25 MG (50000 UNIT) CAPS capsule 1 po three times weekly 36 capsule 1   No current facility-administered medications for this visit.     ASSESSMENT & PLAN:   Assessment/Plan:  Michelle Lamb is a 31 y.o. female with iron deficiency anemia, likely due to poor absorption status post gastric bypass.  I will have her do stool Hemoccults for completeness.  I will arrange for her to have IV iron as soon as possible.  We discussed most common side effects, including headache, GI upset and bone or muscle pain, as well as potential for severe allergic reaction.  I also gave her written information regarding IV iron today.  Due to her family history of malignancy, I will have her speak with the genetic counselor regarding testing for hereditary cancer syndromes.  I will plan to see her back in 2 months for repeat clinical assessment.  I discussed the assessment and treatment plan with the patient.  The patient was provided an opportunity to ask questions and all were answered.  The patient agreed with the plan and demonstrated an understanding of the instructions.  The patient was advised to call back if the symptoms worsen or if fail to improve as anticipated.  Thank you for the opportunity to care for this lovely young woman   I provided 45 minutes of face-to-face time  during this encounter and > 50% was spent counseling as documented under my assessment and plan.    Marvia Pickles, PA-C

## 2021-09-13 ENCOUNTER — Inpatient Hospital Stay: Payer: No Typology Code available for payment source

## 2021-09-14 ENCOUNTER — Other Ambulatory Visit: Payer: Self-pay | Admitting: Hematology and Oncology

## 2021-09-14 ENCOUNTER — Encounter: Payer: Self-pay | Admitting: Hematology and Oncology

## 2021-09-14 DIAGNOSIS — D509 Iron deficiency anemia, unspecified: Secondary | ICD-10-CM

## 2021-09-14 DIAGNOSIS — D500 Iron deficiency anemia secondary to blood loss (chronic): Secondary | ICD-10-CM | POA: Insufficient documentation

## 2021-09-14 DIAGNOSIS — D508 Other iron deficiency anemias: Secondary | ICD-10-CM

## 2021-09-14 HISTORY — DX: Iron deficiency anemia, unspecified: D50.9

## 2021-09-16 ENCOUNTER — Encounter: Payer: Self-pay | Admitting: Hematology and Oncology

## 2021-09-17 ENCOUNTER — Encounter: Payer: Self-pay | Admitting: Hematology and Oncology

## 2021-09-17 ENCOUNTER — Encounter: Payer: Self-pay | Admitting: Radiology

## 2021-09-17 ENCOUNTER — Ambulatory Visit: Payer: No Typology Code available for payment source | Admitting: Radiology

## 2021-09-17 VITALS — BP 120/82 | Ht 63.0 in | Wt 179.0 lb

## 2021-09-17 DIAGNOSIS — R102 Pelvic and perineal pain: Secondary | ICD-10-CM

## 2021-09-17 DIAGNOSIS — K644 Residual hemorrhoidal skin tags: Secondary | ICD-10-CM

## 2021-09-17 MED FILL — Iron Sucrose Inj 20 MG/ML (Fe Equiv): INTRAVENOUS | Qty: 10 | Status: AC

## 2021-09-17 NOTE — Progress Notes (Signed)
   Michelle Lamb 11/16/1990 [id number]   History:  31 y.o. G7P4 presents with c/o pelvic pain/pressure, pain with intercourse and menorrhagia. Unsure if she has endometriosis, states when she had her last c-section and tubal ligation she saw in the noted mention of endometriosis. She also reports rectal bleeding she just noticed while leaving a urine sample.   Gynecologic History Patient's last menstrual period was 08/25/2021 (exact date). Period Cycle (Days): 28 Period Duration (Days): 4 Period Pattern: Regular Menstrual Flow: Heavy (heavy first 2 days) Dysmenorrhea: (!) Severe Dysmenorrhea Symptoms: Cramping, Nausea, Other (Comment) (lightheaded) Contraception/Family planning: tubal ligation Sexually active: yes Last Pap: 51. Results were: normal   Obstetric History OB History  Gravida Para Term Preterm AB Living  '7 4 3   1 4  '$ SAB IAB Ectopic Multiple Live Births  1       4    # Outcome Date GA Lbr Len/2nd Weight Sex Delivery Anes PTL Lv  7 Term 02/12/14 [redacted]w[redacted]d 6 lb 12 oz (3.062 kg) F Vag-Spont EPI N LIV  6 SAB 2015          5 Term 08/06/08 377w0d6 lb 5 oz (2.863 kg) F Vag-Spont EPI N LIV  4 Term 04/02/07 3811w0d lb 5 oz (2.41 kg) F Vag-Spont EPI Y LIV     Complications: Preeclampsia, Diabetes mellitus complicating pregnancy  3 Para           2 Gravida           1 Gravida             Obstetric Comments  H/o GDM and HTN in her last pregnancy     The following portions of the patient's history were reviewed and updated as appropriate: allergies, current medications, past family history, past medical history, past social history, past surgical history, and problem list.  Review of Systems Pertinent items noted in HPI and remainder of comprehensive ROS otherwise negative.   Past medical history, past surgical history, family history and social history were all reviewed and documented in the EPIC chart.   Exam:  Vitals:   09/17/21 1418  BP: 120/82  Weight: 179 lb  (81.2 kg)  Height: '5\' 3"'$  (1.6 m)   Body mass index is 31.71 kg/m.  General appearance:  Normal Respiratory  Auscultation:  Clear without wheezing or rhonchi Cardiovascular  Auscultation:  Regular rate, without rubs, murmurs or gallops  Edema/varicosities:  Not grossly evident Abdominal  Soft,nontender, without masses, guarding or rebound.  Liver/spleen:  No organomegaly noted Genitourinary   Inguinal/mons:  Normal without inguinal adenopathy  External genitalia:  Normal appearing vulva with no masses, tenderness, or lesions  BUS/Urethra/Skene's glands:  Normal without masses or exudate  Vagina:  Normal appearing with normal color and discharge, no lesions  Cervix:  Normal appearing without discharge or lesions  Uterus:  Normal in size, shape and contour.  Mobile, nontender  Adnexa/parametria:     Rt: Normal in size, without masses or tenderness.   Lt: Normal in size, without masses or tenderness.  Anus and perineum:bleeding hemorrhoid   Chaperone offered and declined  Assessment/Plan:   1. Pelvic pain - Urinalysis,Complete w/RFL Culture - Pregnancy, urine: negative - US [place]ansvaginal Non-OB; Future  2. External hemorrhoid, bleeding Sitz baths, prepH, fluids and fiber  Laquanta Hummel B WHNP-BC 2:41 PM 09/17/2021

## 2021-09-19 LAB — URINALYSIS, COMPLETE W/RFL CULTURE
Casts: NONE SEEN /LPF
Crystals: NONE SEEN /HPF
Glucose, UA: NEGATIVE
Hgb urine dipstick: NEGATIVE
Hyaline Cast: NONE SEEN /LPF
Nitrites, Initial: NEGATIVE
Protein, ur: NEGATIVE
RBC / HPF: NONE SEEN /HPF (ref 0–2)
Specific Gravity, Urine: 1.025 (ref 1.001–1.035)
Yeast: NONE SEEN /HPF
pH: 6 (ref 5.0–8.0)

## 2021-09-19 LAB — URINE CULTURE
MICRO NUMBER:: 13678577
SPECIMEN QUALITY:: ADEQUATE

## 2021-09-19 LAB — PREGNANCY, URINE: Preg Test, Ur: NEGATIVE

## 2021-09-19 LAB — CULTURE INDICATED

## 2021-09-20 ENCOUNTER — Inpatient Hospital Stay: Payer: No Typology Code available for payment source

## 2021-09-20 VITALS — BP 134/63 | HR 76 | Temp 98.2°F | Resp 20 | Ht 63.0 in | Wt 180.0 lb

## 2021-09-20 DIAGNOSIS — D508 Other iron deficiency anemias: Secondary | ICD-10-CM

## 2021-09-20 DIAGNOSIS — D509 Iron deficiency anemia, unspecified: Secondary | ICD-10-CM | POA: Diagnosis not present

## 2021-09-20 MED ORDER — SODIUM CHLORIDE 0.9 % IV SOLN
200.0000 mg | Freq: Once | INTRAVENOUS | Status: AC
  Administered 2021-09-20: 200 mg via INTRAVENOUS
  Filled 2021-09-20: qty 200

## 2021-09-20 MED ORDER — SODIUM CHLORIDE 0.9 % IV SOLN: Freq: Once | INTRAVENOUS | Status: AC

## 2021-09-20 NOTE — Patient Instructions (Signed)

## 2021-09-21 MED FILL — Iron Sucrose Inj 20 MG/ML (Fe Equiv): INTRAVENOUS | Qty: 10 | Status: AC

## 2021-09-22 ENCOUNTER — Inpatient Hospital Stay: Payer: No Typology Code available for payment source

## 2021-09-22 VITALS — BP 120/89 | HR 78 | Temp 98.0°F | Resp 20 | Ht 63.0 in | Wt 180.0 lb

## 2021-09-22 DIAGNOSIS — D508 Other iron deficiency anemias: Secondary | ICD-10-CM

## 2021-09-22 DIAGNOSIS — D509 Iron deficiency anemia, unspecified: Secondary | ICD-10-CM | POA: Diagnosis not present

## 2021-09-22 MED ORDER — SODIUM CHLORIDE 0.9 % IV SOLN: Freq: Once | INTRAVENOUS | Status: AC

## 2021-09-22 MED ORDER — SODIUM CHLORIDE 0.9 % IV SOLN
200.0000 mg | Freq: Once | INTRAVENOUS | Status: AC
  Administered 2021-09-22: 200 mg via INTRAVENOUS
  Filled 2021-09-22: qty 200

## 2021-09-22 NOTE — Patient Instructions (Signed)

## 2021-09-23 MED FILL — Iron Sucrose Inj 20 MG/ML (Fe Equiv): INTRAVENOUS | Qty: 10 | Status: AC

## 2021-09-24 ENCOUNTER — Inpatient Hospital Stay: Payer: No Typology Code available for payment source

## 2021-09-24 VITALS — BP 138/74 | HR 78 | Temp 98.8°F | Resp 20

## 2021-09-24 DIAGNOSIS — D509 Iron deficiency anemia, unspecified: Secondary | ICD-10-CM | POA: Diagnosis not present

## 2021-09-24 DIAGNOSIS — D508 Other iron deficiency anemias: Secondary | ICD-10-CM

## 2021-09-24 MED ORDER — ALTEPLASE 2 MG IJ SOLR: 2.0000 mg | Freq: Once | INTRAMUSCULAR | Status: DC | PRN

## 2021-09-24 MED ORDER — SODIUM CHLORIDE 0.9 % IV SOLN: Freq: Once | INTRAVENOUS | Status: AC

## 2021-09-24 MED ORDER — SODIUM CHLORIDE 0.9% FLUSH: 10.0000 mL | Freq: Once | INTRAVENOUS | Status: DC | PRN

## 2021-09-24 MED ORDER — HEPARIN SOD (PORK) LOCK FLUSH 100 UNIT/ML IV SOLN: 250.0000 [IU] | Freq: Once | INTRAVENOUS | Status: DC | PRN

## 2021-09-24 MED ORDER — SODIUM CHLORIDE 0.9% FLUSH: 3.0000 mL | Freq: Once | INTRAVENOUS | Status: DC | PRN

## 2021-09-24 MED ORDER — SODIUM CHLORIDE 0.9 % IV SOLN
200.0000 mg | Freq: Once | INTRAVENOUS | Status: AC
  Administered 2021-09-24: 200 mg via INTRAVENOUS
  Filled 2021-09-24: qty 200

## 2021-09-24 MED ORDER — HEPARIN SOD (PORK) LOCK FLUSH 100 UNIT/ML IV SOLN: 500.0000 [IU] | Freq: Once | INTRAVENOUS | Status: DC | PRN

## 2021-09-24 NOTE — Patient Instructions (Signed)

## 2021-09-27 ENCOUNTER — Other Ambulatory Visit: Payer: Self-pay | Admitting: Physician Assistant

## 2021-09-27 DIAGNOSIS — K760 Fatty (change of) liver, not elsewhere classified: Secondary | ICD-10-CM

## 2021-09-27 DIAGNOSIS — F418 Other specified anxiety disorders: Secondary | ICD-10-CM

## 2021-09-27 MED ORDER — OZEMPIC (0.25 OR 0.5 MG/DOSE) 2 MG/3ML ~~LOC~~ SOPN: 0.5000 mg | PEN_INJECTOR | SUBCUTANEOUS | 0 refills | Status: DC

## 2021-09-27 MED ORDER — LORAZEPAM 1 MG PO TABS: 1.0000 mg | ORAL_TABLET | Freq: Every day | ORAL | 1 refills | Status: DC | PRN

## 2021-09-27 MED FILL — Iron Sucrose Inj 20 MG/ML (Fe Equiv): INTRAVENOUS | Qty: 10 | Status: AC

## 2021-09-28 ENCOUNTER — Inpatient Hospital Stay: Payer: No Typology Code available for payment source | Attending: Hematology and Oncology

## 2021-09-28 VITALS — BP 130/80 | HR 86 | Temp 98.3°F | Resp 16 | Ht 63.0 in | Wt 183.0 lb

## 2021-09-28 DIAGNOSIS — D509 Iron deficiency anemia, unspecified: Secondary | ICD-10-CM | POA: Insufficient documentation

## 2021-09-28 DIAGNOSIS — D508 Other iron deficiency anemias: Secondary | ICD-10-CM

## 2021-09-28 MED ORDER — SODIUM CHLORIDE 0.9 % IV SOLN
200.0000 mg | Freq: Once | INTRAVENOUS | Status: AC
  Administered 2021-09-28: 200 mg via INTRAVENOUS
  Filled 2021-09-28: qty 200

## 2021-09-28 MED ORDER — SODIUM CHLORIDE 0.9 % IV SOLN: Freq: Once | INTRAVENOUS | Status: AC

## 2021-09-28 MED FILL — Iron Sucrose Inj 20 MG/ML (Fe Equiv): INTRAVENOUS | Qty: 10 | Status: AC

## 2021-09-28 NOTE — Patient Instructions (Signed)

## 2021-09-29 ENCOUNTER — Inpatient Hospital Stay: Payer: No Typology Code available for payment source

## 2021-09-29 ENCOUNTER — Other Ambulatory Visit: Payer: Self-pay | Admitting: Nurse Practitioner

## 2021-09-29 ENCOUNTER — Ambulatory Visit (INDEPENDENT_AMBULATORY_CARE_PROVIDER_SITE_OTHER): Payer: No Typology Code available for payment source

## 2021-09-29 ENCOUNTER — Other Ambulatory Visit: Payer: Self-pay | Admitting: Physician Assistant

## 2021-09-29 VITALS — BP 132/92 | HR 70 | Temp 98.9°F | Resp 18

## 2021-09-29 DIAGNOSIS — N3001 Acute cystitis with hematuria: Secondary | ICD-10-CM

## 2021-09-29 DIAGNOSIS — E669 Obesity, unspecified: Secondary | ICD-10-CM

## 2021-09-29 DIAGNOSIS — D508 Other iron deficiency anemias: Secondary | ICD-10-CM

## 2021-09-29 DIAGNOSIS — D509 Iron deficiency anemia, unspecified: Secondary | ICD-10-CM | POA: Diagnosis not present

## 2021-09-29 DIAGNOSIS — K760 Fatty (change of) liver, not elsewhere classified: Secondary | ICD-10-CM

## 2021-09-29 LAB — POCT URINALYSIS DIPSTICK
Bilirubin, UA: 2
Glucose, UA: NEGATIVE
Ketones, UA: NEGATIVE
Nitrite, UA: NEGATIVE
Protein, UA: POSITIVE — AB
Spec Grav, UA: >=1.03 — AB (ref 1.010–1.025)
Urobilinogen, UA: 1 E.U./dL
pH, UA: 6 (ref 5.0–8.0)

## 2021-09-29 MED ORDER — SODIUM CHLORIDE 0.9 % IV SOLN
200.0000 mg | Freq: Once | INTRAVENOUS | Status: AC
  Administered 2021-09-29: 200 mg via INTRAVENOUS
  Filled 2021-09-29: qty 200

## 2021-09-29 MED ORDER — SEMAGLUTIDE (1 MG/DOSE) 4 MG/3ML ~~LOC~~ SOPN: 1.0000 mg | PEN_INJECTOR | SUBCUTANEOUS | 0 refills | Status: DC

## 2021-09-29 MED ORDER — SODIUM CHLORIDE 0.9 % IV SOLN: Freq: Once | INTRAVENOUS | Status: AC

## 2021-09-29 MED ORDER — NITROFURANTOIN MONOHYD MACRO 100 MG PO CAPS: 100.0000 mg | ORAL_CAPSULE | Freq: Two times a day (BID) | ORAL | 0 refills | Status: DC

## 2021-09-29 NOTE — Progress Notes (Signed)
Patient is here to check UA. Per Marianne Sofia sent urine to lab for urine culture. Antibiotic was sent to the pharmacy.

## 2021-09-29 NOTE — Patient Instructions (Signed)

## 2021-10-01 LAB — URINE CULTURE

## 2021-10-06 ENCOUNTER — Ambulatory Visit (INDEPENDENT_AMBULATORY_CARE_PROVIDER_SITE_OTHER): Payer: No Typology Code available for payment source

## 2021-10-06 ENCOUNTER — Ambulatory Visit (INDEPENDENT_AMBULATORY_CARE_PROVIDER_SITE_OTHER): Payer: No Typology Code available for payment source | Admitting: Radiology

## 2021-10-06 ENCOUNTER — Encounter: Payer: Self-pay | Admitting: Radiology

## 2021-10-06 VITALS — BP 124/82

## 2021-10-06 DIAGNOSIS — N920 Excessive and frequent menstruation with regular cycle: Secondary | ICD-10-CM

## 2021-10-06 DIAGNOSIS — R102 Pelvic and perineal pain: Secondary | ICD-10-CM

## 2021-10-06 MED ORDER — JUNEL FE 24 1-20 MG-MCG(24) PO TABS: 1.0000 | ORAL_TABLET | Freq: Every day | ORAL | 0 refills | Status: DC

## 2021-10-06 NOTE — Progress Notes (Signed)
   Michelle Lamb Jul 09, 1990 [id number]   History:  31 y.o. G7P4 presents for pelvic U/S. She had originally presents with c/o pelvic pain/pressure, pain with intercourse and menorrhagia. Receives iron infusion for severe anemia.  Gynecologic History Patient's last menstrual period was 08/25/2021 (exact date).   Contraception/Family planning: tubal ligation Sexually active: yes Last Pap: 13. Results were: normal   Obstetric History OB History  Gravida Para Term Preterm AB Living  '7 4 3   1 4  '$ SAB IAB Ectopic Multiple Live Births  1       4    # Outcome Date GA Lbr Len/2nd Weight Sex Delivery Anes PTL Lv  7 Term 02/12/14 [redacted]w[redacted]d 6 lb 12 oz (3.062 kg) F Vag-Spont EPI N LIV  6 SAB 2015          5 Term 08/06/08 375w0d6 lb 5 oz (2.863 kg) F Vag-Spont EPI N LIV  4 Term 04/02/07 3839w0d lb 5 oz (2.41 kg) F Vag-Spont EPI Y LIV     Complications: Preeclampsia, Diabetes mellitus complicating pregnancy  3 Para           2 Gravida           1 Gravida             Obstetric Comments  H/o GDM and HTN in her last pregnancy     The following portions of the patient's history were reviewed and updated as appropriate: allergies, current medications, past family history, past medical history, past social history, past surgical history, and problem list.  Review of Systems Pertinent items noted in HPI and remainder of comprehensive ROS otherwise negative.   Past medical history, past surgical history, family history and social history were all reviewed and documented in the EPIC chart.   Exam:  Vitals:   10/06/21 0905  BP: 124/82   There is no height or weight on file to calculate BMI.  Narrative & Impression Indications: pelvic pain, menorrhagia   Vaginal u/s:   Uterus 8.58 x 4.63 x 4.92cm, anteverted, normal size and shape No myometrial masses Slightly heterogenous myometrium with streaky shadowing suggestive of adenomyosis   Tri layered symmetrical endometrium measuring  8.72m58m obvious masses or thickening   Both ovaries mobile, normal size with normal follicle pattern and normal perfusion Small avascular resolving right ovarian cyst noted ?ruptured cyst   No adnexal masses Small - moderate volume of pelvic free fluid   Impression: adenomyosis, resolving right ovarian cyst  Assessment/Plan:   1. Pelvic pain Resolving- likely from right ovarian cyst  2. Menorrhagia with regular cycle, possible adenomyosis Discussed treatment options, elects for OCPs  - Norethindrone Acetate-Ethinyl Estrad-FE (JUNEL FE 24) 1-20 MG-MCG(24) tablet; Take 1 tablet by mouth daily.  Dispense: 84 tablet; Refill: 0   Follow up 3 months. Will check LFTs at that visit, patient has a hx of non alcoholic fatty liver  Kirklin Mcduffee B WHNP-BC 9:28 AM 10/06/2021

## 2021-10-12 ENCOUNTER — Ambulatory Visit (INDEPENDENT_AMBULATORY_CARE_PROVIDER_SITE_OTHER): Payer: No Typology Code available for payment source | Admitting: Physician Assistant

## 2021-10-12 ENCOUNTER — Encounter: Payer: Self-pay | Admitting: Physician Assistant

## 2021-10-12 VITALS — BP 114/70 | HR 69 | Temp 97.3°F | Resp 18 | Ht 63.0 in | Wt 182.8 lb

## 2021-10-12 DIAGNOSIS — D49511 Neoplasm of unspecified behavior of right kidney: Secondary | ICD-10-CM | POA: Diagnosis not present

## 2021-10-12 DIAGNOSIS — D508 Other iron deficiency anemias: Secondary | ICD-10-CM | POA: Diagnosis not present

## 2021-10-12 DIAGNOSIS — F900 Attention-deficit hyperactivity disorder, predominantly inattentive type: Secondary | ICD-10-CM | POA: Diagnosis not present

## 2021-10-12 DIAGNOSIS — D649 Anemia, unspecified: Secondary | ICD-10-CM

## 2021-10-12 HISTORY — DX: Anemia, unspecified: D64.9

## 2021-10-12 MED ORDER — LISDEXAMFETAMINE DIMESYLATE 30 MG PO CAPS: 30.0000 mg | ORAL_CAPSULE | Freq: Every day | ORAL | 0 refills | Status: DC

## 2021-10-12 NOTE — Progress Notes (Signed)
Subjective:  Patient ID: Michelle Lamb, female    DOB: 1990/08/27  Age: 31 y.o. MRN: [id number]  Chief Complaint  Patient presents with   ADD    HPI  Pt in for follow up of ADD - was started on vyvanse '20mg'$  qd and pt states that she feels as though she would benefit for increased dose - tolerated the medication well   Pt was diagnosed with iron def anemia and currently following Dr Bobby Rumpf (hematologist) - she has received 5 iron infusions and will have appt and repeat labwork in September.  She states overall her energy level is slowing improving  Pt with history of neoplasm of right kidney with subsequent complications following surgery - she has been referred to new nephrologist for further evaluation and follow up and has appt on 10/19/21 Current Outpatient Medications on File Prior to Visit  Medication Sig Dispense Refill   albuterol (VENTOLIN HFA) 108 (90 Base) MCG/ACT inhaler Inhale 2 puffs into the lungs every 6 (six) hours as needed for wheezing or shortness of breath. 1 each 5   budesonide-formoterol (SYMBICORT) 80-4.5 MCG/ACT inhaler Inhale 2 puffs into the lungs daily as needed (wheezing).     cyanocobalamin (,VITAMIN B-12,) 1000 MCG/ML injection Inject 1 ML as directed once a month 1 mL 2   docusate sodium (COLACE) 100 MG capsule Take 1 capsule (100 mg total) by mouth 2 (two) times daily. (Patient taking differently: Take 100 mg by mouth daily.)     LORazepam (ATIVAN) 1 MG tablet Take 1 tablet (1 mg total) by mouth daily as needed. 30 tablet 1   Norethindrone Acetate-Ethinyl Estrad-FE (JUNEL FE 24) 1-20 MG-MCG(24) tablet Take 1 tablet by mouth daily. 84 tablet 0   omeprazole (PRILOSEC) 20 MG capsule Take twice daily while taking prednisone (Patient taking differently: Take 20 mg by mouth in the morning.) 30 capsule 0   ondansetron (ZOFRAN-ODT) 4 MG disintegrating tablet Take 1 tablet (4 mg total) by mouth every 8 (eight) hours as needed for nausea or vomiting. 90 tablet 1    Semaglutide, 1 MG/DOSE, 4 MG/3ML SOPN Inject 1 mg as directed once a week. 3 mL 0   Vitamin D, Ergocalciferol, (DRISDOL) 1.25 MG (50000 UNIT) CAPS capsule 1 po three times weekly 36 capsule 1   No current facility-administered medications on file prior to visit.   Past Medical History:  Diagnosis Date   ADHD    Anxiety    Asthma    Chronic kidney disease    Complication of anesthesia    Slow to wake up   Depression    Diabetes mellitus without complication ([place])    TYPE 2 LAST DOSE FRIDAY OCTOBER 27 (METFORMIN)   Fatty liver    GERD (gastroesophageal reflux disease)    History of herpes simplex infection    Hyperlipidemia    Hypertension    no meds since gastric   Iron deficiency anemia 09/14/2021   Neoplasm of right kidney    Seasonal allergies    Sleep apnea    Vaginal delivery 2009, 2010, 2015   Vitamin D deficiency    Past Surgical History:  Procedure Laterality Date   CESAREAN SECTION  09/29/2016   CHOLECYSTECTOMY     CYSTOSCOPY W/ URETERAL STENT PLACEMENT Right 07/15/2021   Procedure: CYSTOSCOPY WITH RETROGRADE PYELOGRAM/URETERAL STENT PLACEMENT;  Surgeon: Raynelle Bring, MD;  Location: [facility];  Service: Urology;  Laterality: Right;   CYSTOSCOPY WITH RETROGRADE PYELOGRAM, URETEROSCOPY AND STENT PLACEMENT Right 05/03/2021   Procedure:  CYSTOSCOPY WITH RIGHT RETROGRADE PYELOGRAM, URETEROSCOPY;  Surgeon: Raynelle Bring, MD;  Location: [facility];  Service: Urology;  Laterality: Right;   DILATION AND CURETTAGE OF UTERUS N/A 12/31/2015   Procedure: SUCTION DILATATION AND CURETTAGE;  Surgeon: Aletha Halim, MD;  Location: [facility];  Service: Gynecology;  Laterality: N/A;   DILATION AND EVACUATION N/A 01/01/2016   Procedure: DILATATION AND EVACUATION;  Surgeon: Jonnie Kind, MD;  Location: [facility];  Service: Gynecology;  Laterality: N/A;   ESOPHAGOGASTRODUODENOSCOPY     GASTRIC BYPASS  06/02/2020   IR NEPHRO TUBE REMOV/FL  07/16/2021   IR NEPHROSTOMY EXCHANGE RIGHT  05/12/2021   IR  NEPHROSTOMY PLACEMENT RIGHT  05/05/2021   ROBOT ASSISTED PYELOPLASTY Right 07/15/2021   Procedure: XI ROBOTIC ASSISTED  LAPAROSCOPIC PYELOPLASTY;  Surgeon: Raynelle Bring, MD;  [facility];  Service: Urology;  Laterality: Right;   ROBOTIC ASSITED PARTIAL NEPHRECTOMY Right 03/15/2021   Procedure: XI ROBOTIC ASSITED PARTIAL NEPHRECTOMY;  Surgeon: Raynelle Bring, MD;  [facility];  Service: Urology;  Laterality: Right;   TONSILLECTOMY     TUBAL LIGATION      Family History  Problem Relation Age of Onset   Hypertension Mother    Thyroid disease Mother    Cancer Maternal Grandmother        stomach cancer   Colon cancer Maternal Grandmother    Depression Maternal Grandfather    Cancer Maternal Grandfather        leukemia   Depression Paternal Grandmother    Social History   Socioeconomic History   Marital status: Married    Spouse name: Not on file   Number of children: 9   Years of education: 13   Highest education level: Associate degree: academic program  Occupational History   Occupation: Physiological scientist  Tobacco Use   Smoking status: Never   Smokeless tobacco: Never  Scientific laboratory technician Use: Never used  Substance and Sexual Activity   Alcohol use: Yes    Comment: OCCASIONAL   Drug use: No   Sexual activity: Yes    Partners: Male    Birth control/protection: Surgical    Comment: tubal  Other Topics Concern   Not on file  Social History Narrative   Not on file   Social Determinants of Health   Financial Resource Strain: Not on file  Food Insecurity: Not on file  Transportation Needs: Not on file  Physical Activity: Not on file  Stress: Not on file  Social Connections: Not on file    Review of Systems  CONSTITUTIONAL: see HPI  CARDIOVASCULAR: Negative for chest pain, RESPIRATORY: Negative for recent cough and dyspnea.   INTEGUMENTARY: Negative for rash.   PSYCHIATRIC: Negative for sleep disturbance and to question depression screen.   Negative for depression, negative for anhedonia.      Objective:  PHYSICAL EXAM:   VS: BP 114/70   Pulse 69   Temp (!) 97.3 F (36.3 C)   Resp 18   Ht '5\' 3"'$  (1.6 m)   Wt 182 lb 12.8 oz (82.9 kg)   LMP 08/25/2021 (Exact Date)   SpO2 99%   BMI 32.38 kg/m   GEN: Well nourished, well developed, in no acute distress  Cardiac: RRR; no murmurs, rubs,  Respiratory:  normal respiratory rate and pattern with no distress - normal breath sounds with no rales, rhonchi, wheezes or rubs  Skin: warm and dry, no rash   Psych: euthymic mood, appropriate affect and demeanor  Lab Results  Component Value Date   WBC 10.4 09/10/2021   HGB 10.0 (A) 09/10/2021   HCT 32 (A) 09/10/2021   PLT 301 09/10/2021   GLUCOSE 78 09/08/2021   CHOL 162 09/08/2021   TRIG 60 09/08/2021   HDL 59 09/08/2021   LDLCALC 91 09/08/2021   ALT 21 09/08/2021   AST 16 09/08/2021   NA 140 09/08/2021   K 3.7 09/08/2021   CL 103 09/08/2021   CREATININE 0.59 09/08/2021   BUN 11 09/08/2021   CO2 21 09/08/2021   TSH 1.260 09/08/2021   INR 1.0 05/05/2021   HGBA1C 5.1 07/07/2021      Assessment & Plan:   Problem List Items Addressed This Visit       Genitourinary   Neoplasm of right kidney Follow up with nephrology as directed     Other   Attention deficit hyperactivity disorder (ADHD), predominantly inattentive type   Absolute anemia - Primary Continue follow up with hematology  .  Meds ordered this encounter  Medications   lisdexamfetamine (VYVANSE) 30 MG capsule    Sig: Take 1 capsule (30 mg total) by mouth daily.    Dispense:  30 capsule    Refill:  0    Order Specific Question:   Supervising Provider    Answer:   Shelton Silvas    No orders of the defined types were placed in this encounter.    Follow-up: No follow-ups on file.  An After Visit Summary was printed and given to the patient.  Yetta Flock [facility] [phone]

## 2021-10-13 ENCOUNTER — Other Ambulatory Visit: Payer: Self-pay

## 2021-10-13 ENCOUNTER — Other Ambulatory Visit (HOSPITAL_COMMUNITY): Payer: Self-pay

## 2021-10-13 DIAGNOSIS — F900 Attention-deficit hyperactivity disorder, predominantly inattentive type: Secondary | ICD-10-CM

## 2021-10-13 MED ORDER — LISDEXAMFETAMINE DIMESYLATE 30 MG PO CAPS
30.0000 mg | ORAL_CAPSULE | Freq: Every day | ORAL | 0 refills | Status: DC
  Filled 2021-10-13 – 2021-10-14 (×2): qty 30, 30d supply, fill #0

## 2021-10-13 NOTE — Telephone Encounter (Signed)
[facility] does not have supply. Vyvanse script was cancelled at [facility]. Patient requests this be sent to [facility].   Royce Macadamia, [place] 10/13/21 11:18 AM

## 2021-10-14 ENCOUNTER — Other Ambulatory Visit (HOSPITAL_COMMUNITY): Payer: Self-pay

## 2021-10-14 ENCOUNTER — Telehealth: Payer: Self-pay

## 2021-10-14 NOTE — Telephone Encounter (Signed)
Patient called stated that she is having blood in stool. Please advise

## 2021-10-19 ENCOUNTER — Other Ambulatory Visit: Payer: Self-pay | Admitting: Nurse Practitioner

## 2021-10-19 DIAGNOSIS — K219 Gastro-esophageal reflux disease without esophagitis: Secondary | ICD-10-CM

## 2021-10-20 ENCOUNTER — Other Ambulatory Visit (HOSPITAL_COMMUNITY): Payer: Self-pay | Admitting: Urology

## 2021-10-20 ENCOUNTER — Other Ambulatory Visit: Payer: Self-pay | Admitting: Urology

## 2021-10-20 DIAGNOSIS — N131 Hydronephrosis with ureteral stricture, not elsewhere classified: Secondary | ICD-10-CM

## 2021-10-21 NOTE — Telephone Encounter (Signed)
Why can't she take tylenol? No allergies listed. I would recommend heating pad or a hot bath. Most pain medications contain an NSAID or acetaminophen.

## 2021-10-25 ENCOUNTER — Encounter (HOSPITAL_COMMUNITY)
Admission: RE | Admit: 2021-10-25 | Discharge: 2021-10-25 | Disposition: A | Discharge status: Home / Self Care | Payer: No Typology Code available for payment source | Source: Ambulatory Visit | Attending: Urology | Admitting: Urology

## 2021-10-25 DIAGNOSIS — N131 Hydronephrosis with ureteral stricture, not elsewhere classified: Secondary | ICD-10-CM | POA: Diagnosis present

## 2021-10-25 MED ORDER — FUROSEMIDE 10 MG/ML IJ SOLN
INTRAMUSCULAR | Status: AC
  Filled 2021-10-25: qty 8

## 2021-10-25 MED ORDER — FUROSEMIDE 10 MG/ML IJ SOLN
42.0000 mg | Freq: Once | INTRAMUSCULAR | Status: AC
  Administered 2021-10-25: 42 mg via INTRAVENOUS

## 2021-10-25 MED ORDER — TECHNETIUM TC 99M MERTIATIDE
5.0000 | Freq: Once | INTRAVENOUS | Status: AC
  Administered 2021-10-25: 5 via INTRAVENOUS

## 2021-11-02 ENCOUNTER — Other Ambulatory Visit: Payer: Self-pay | Admitting: Nurse Practitioner

## 2021-11-02 DIAGNOSIS — Z9889 Other specified postprocedural states: Secondary | ICD-10-CM

## 2021-11-02 LAB — COMPREHENSIVE METABOLIC PANEL
ALT: 11 IU/L (ref 0–32)
AST: 12 IU/L (ref 0–40)
Albumin/Globulin Ratio: 2.3 — ABNORMAL HIGH (ref 1.2–2.2)
Albumin: 4.3 g/dL (ref 3.9–4.9)
Alkaline Phosphatase: 71 IU/L (ref 44–121)
BUN/Creatinine Ratio: 18 (ref 9–23)
BUN: 11 mg/dL (ref 6–20)
Bilirubin Total: 0.4 mg/dL (ref 0.0–1.2)
CO2: 21 mmol/L (ref 20–29)
Calcium: 9.2 mg/dL (ref 8.7–10.2)
Chloride: 107 mmol/L — ABNORMAL HIGH (ref 96–106)
Creatinine, Ser: 0.6 mg/dL (ref 0.57–1.00)
Globulin, Total: 1.9 g/dL (ref 1.5–4.5)
Glucose: 99 mg/dL (ref 70–99)
Potassium: 4.1 mmol/L (ref 3.5–5.2)
Sodium: 140 mmol/L (ref 134–144)
Total Protein: 6.2 g/dL (ref 6.0–8.5)
eGFR: 122 mL/min/{1.73_m2} (ref >59)

## 2021-11-03 ENCOUNTER — Ambulatory Visit: Payer: No Typology Code available for payment source

## 2021-11-04 ENCOUNTER — Other Ambulatory Visit: Payer: Self-pay | Admitting: Physician Assistant

## 2021-11-04 DIAGNOSIS — M62838 Other muscle spasm: Secondary | ICD-10-CM

## 2021-11-04 DIAGNOSIS — M5441 Lumbago with sciatica, right side: Secondary | ICD-10-CM

## 2021-11-04 MED ORDER — CYCLOBENZAPRINE HCL 10 MG PO TABS: 10.0000 mg | ORAL_TABLET | Freq: Three times a day (TID) | ORAL | 0 refills | Status: DC | PRN

## 2021-11-05 ENCOUNTER — Encounter: Payer: No Typology Code available for payment source | Admitting: Genetic Counselor

## 2021-11-10 ENCOUNTER — Other Ambulatory Visit: Payer: Self-pay | Admitting: Oncology

## 2021-11-10 DIAGNOSIS — D508 Other iron deficiency anemias: Secondary | ICD-10-CM

## 2021-11-10 NOTE — Progress Notes (Signed)
[facility]  [address] [place],  [place]  [id number] [phone]  Clinic Day:  11/11/2021  Referring physician: [name], PA-C   REASON FOR CONSULTATION:  Iron deficiency anemia  HISTORY OF PRESENT ILLNESS:  Michelle Lamb is a 31 y.o. female with iron deficiency anemia secondary to both heavy menstrual cycles and a recent gastric bypass surgery.  She comes in today to reassess her iron and hemoglobin levels after recently receiving IV iron.  Although she is doing okay today, she claims to have not noticed a significant improvement in how she has felt since her IV iron was given.  Her menstrual cycles remain relatively heavy.  She denies having other overt forms of blood loss.     VITALS:  Blood pressure 132/87, pulse 65, temperature 98.6 F (37 C), resp. rate 14, height '5\' 3"'$  (1.6 m), weight 183 lb 12.8 oz (83.4 kg), SpO2 100 %.  Wt Readings from Last 3 Encounters:  11/11/21 183 lb 12.8 oz (83.4 kg)  10/12/21 182 lb 12.8 oz (82.9 kg)  09/28/21 183 lb (83 kg)    Body mass index is 32.56 kg/m.  Performance status (ECOG): 1 - Symptomatic but completely ambulatory  PHYSICAL EXAM:  Physical Exam Vitals and nursing note reviewed.  Constitutional:      General: She is not in acute distress.    Appearance: Normal appearance.  HENT:     Head: Normocephalic and atraumatic.     Mouth/Throat:     Mouth: Mucous membranes are moist.     Pharynx: Oropharynx is clear. No oropharyngeal exudate or posterior oropharyngeal erythema.  Eyes:     General: No scleral icterus.    Extraocular Movements: Extraocular movements intact.     Conjunctiva/sclera: Conjunctivae normal.     Pupils: Pupils are equal, round, and reactive to light.  Cardiovascular:     Rate and Rhythm: Normal rate and regular rhythm.     Heart sounds: Normal heart sounds. No murmur heard.    No friction rub. No gallop.  Pulmonary:     Effort: Pulmonary effort is normal.      Breath sounds: Normal breath sounds. No wheezing, rhonchi or rales.  Abdominal:     General: There is no distension.     Palpations: Abdomen is soft. There is no hepatomegaly, splenomegaly or mass.     Tenderness: There is no abdominal tenderness.  Musculoskeletal:        General: Normal range of motion.     Cervical back: Normal range of motion and neck supple. No tenderness.     Right lower leg: No edema.     Left lower leg: No edema.  Lymphadenopathy:     Cervical: No cervical adenopathy.     Upper Body:     Right upper body: No supraclavicular or axillary adenopathy.     Left upper body: No supraclavicular or axillary adenopathy.     Lower Body: No right inguinal adenopathy. No left inguinal adenopathy.  Skin:    General: Skin is warm and dry.     Coloration: Skin is not jaundiced.     Findings: No rash.  Neurological:     Mental Status: She is alert and oriented to person, place, and time.     Cranial Nerves: No cranial nerve deficit.  Psychiatric:        Mood and Affect: Mood normal.        Behavior: Behavior normal.  Thought Content: Thought content normal.      LABS:      Latest Ref Rng & Units 11/11/2021   12:00 AM 09/10/2021   12:00 AM 09/08/2021   10:11 AM  CBC  WBC  8.2     10.4     9.6   Hemoglobin 12.0 - 16.0 12.3     10.0     10.4   Hematocrit 36 - 46 37     32     32.7   Platelets 150 - 400 K/uL 267     301     334      This result is from an external source.      Latest Ref Rng & Units 11/02/2021    9:46 AM 09/08/2021   10:11 AM 07/17/2021    4:11 AM  CMP  Glucose 70 - 99 mg/dL 99  78  77   BUN 6 - 20 mg/dL '11  11  17   '$ Creatinine 0.57 - 1.00 mg/dL 0.60  0.59  0.69   Sodium 134 - 144 mmol/L 140  140  139   Potassium 3.5 - 5.2 mmol/L 4.1  3.7  4.6   Chloride 96 - 106 mmol/L 107  103  110   CO2 20 - 29 mmol/L '21  21  25   '$ Calcium 8.7 - 10.2 mg/dL 9.2  9.1  8.6   Total Protein 6.0 - 8.5 g/dL 6.2  6.3    Total Bilirubin 0.0 - 1.2 mg/dL 0.4   0.6    Alkaline Phos 44 - 121 IU/L 71  104    AST 0 - 40 IU/L 12  16    ALT 0 - 32 IU/L 11  21      Latest Reference Range & Units Most Recent 09/08/21 10:11  Iron 28 - 170 ug/dL 99 11/11/21 08:47 23 (L)  UIBC ug/dL 280 11/11/21 08:47 321  TIBC 250 - 450 ug/dL 379 11/11/21 08:47 344  Saturation Ratios 10.4 - 31.8 % 26 11/11/21 08:47   Ferritin 11 - 307 ng/mL 69 11/11/21 08:47 13 (L)  (L): Data is abnormally low  ASSESSMENT & PLAN:  Assessment/Plan: A 31 y.o. female with iron deficiency anemia secondary to heavy menstrual cycles and recent gastric bypass surgery.  I am pleased as her iron and hemoglobin levels have clearly improved after recently receiving IV iron.  Clinically, she appears to be doing well.  She knows to follow-up with her gynecologist with respect to the management of her heavy menstrual cycles.  Otherwise, I will see this patient back in 6 months for repeat clinical assessment.  The patient understands all the plans discussed today and is in agreement with them.  Michelle Mcdermid Macarthur Critchley, MD

## 2021-11-11 ENCOUNTER — Inpatient Hospital Stay: Payer: No Typology Code available for payment source | Attending: Hematology and Oncology

## 2021-11-11 ENCOUNTER — Ambulatory Visit: Payer: Self-pay | Admitting: Hematology and Oncology

## 2021-11-11 ENCOUNTER — Other Ambulatory Visit: Payer: Self-pay | Admitting: Oncology

## 2021-11-11 ENCOUNTER — Other Ambulatory Visit: Payer: Self-pay

## 2021-11-11 ENCOUNTER — Inpatient Hospital Stay (INDEPENDENT_AMBULATORY_CARE_PROVIDER_SITE_OTHER): Payer: No Typology Code available for payment source | Admitting: Oncology

## 2021-11-11 VITALS — BP 132/87 | HR 65 | Temp 98.6°F | Resp 14 | Ht 63.0 in | Wt 183.8 lb

## 2021-11-11 DIAGNOSIS — D508 Other iron deficiency anemias: Secondary | ICD-10-CM | POA: Diagnosis not present

## 2021-11-11 DIAGNOSIS — D500 Iron deficiency anemia secondary to blood loss (chronic): Secondary | ICD-10-CM | POA: Diagnosis not present

## 2021-11-11 DIAGNOSIS — Z9884 Bariatric surgery status: Secondary | ICD-10-CM | POA: Insufficient documentation

## 2021-11-11 LAB — CBC AND DIFFERENTIAL
HCT: 37 (ref 36–46)
Hemoglobin: 12.3 (ref 12.0–16.0)
Neutrophils Absolute: 5
Platelets: 267 10*3/uL (ref 150–400)
WBC: 8.2

## 2021-11-11 LAB — IRON AND TIBC
Iron: 99 ug/dL (ref 28–170)
Saturation Ratios: 26 % (ref 10.4–31.8)
TIBC: 379 ug/dL (ref 250–450)
UIBC: 280 ug/dL

## 2021-11-11 LAB — FERRITIN: Ferritin: 69 ng/mL (ref 11–307)

## 2021-11-11 LAB — VITAMIN B12: Vitamin B-12: 140 pg/mL — ABNORMAL LOW (ref 180–914)

## 2021-11-11 LAB — CBC: RBC: 4.4 (ref 3.87–5.11)

## 2021-11-11 LAB — FOLATE: Folate: 7.5 ng/mL (ref >5.9)

## 2021-11-15 ENCOUNTER — Other Ambulatory Visit: Payer: Self-pay

## 2021-11-15 DIAGNOSIS — F900 Attention-deficit hyperactivity disorder, predominantly inattentive type: Secondary | ICD-10-CM

## 2021-11-15 DIAGNOSIS — D513 Other dietary vitamin B12 deficiency anemia: Secondary | ICD-10-CM

## 2021-11-15 DIAGNOSIS — F418 Other specified anxiety disorders: Secondary | ICD-10-CM

## 2021-11-15 DIAGNOSIS — E1169 Type 2 diabetes mellitus with other specified complication: Secondary | ICD-10-CM

## 2021-11-15 DIAGNOSIS — E559 Vitamin D deficiency, unspecified: Secondary | ICD-10-CM

## 2021-11-15 DIAGNOSIS — K760 Fatty (change of) liver, not elsewhere classified: Secondary | ICD-10-CM

## 2021-11-15 MED ORDER — VITAMIN D (ERGOCALCIFEROL) 1.25 MG (50000 UNIT) PO CAPS
ORAL_CAPSULE | ORAL | 1 refills | Status: DC
  Filled 2021-11-15: qty 36, 84d supply, fill #0

## 2021-11-15 MED ORDER — CYANOCOBALAMIN 1000 MCG/ML IJ SOLN
INTRAMUSCULAR | 2 refills | Status: DC
  Filled 2021-11-15: qty 1, 28d supply, fill #0

## 2021-11-15 MED ORDER — LORAZEPAM 1 MG PO TABS
1.0000 mg | ORAL_TABLET | Freq: Every day | ORAL | 1 refills | Status: DC | PRN
  Filled 2021-11-15: qty 30, 30d supply, fill #0

## 2021-11-15 MED ORDER — LISDEXAMFETAMINE DIMESYLATE 30 MG PO CAPS
30.0000 mg | ORAL_CAPSULE | Freq: Every day | ORAL | 0 refills | Status: DC
  Filled 2021-11-15: qty 30, 30d supply, fill #0

## 2021-11-15 MED ORDER — SEMAGLUTIDE (1 MG/DOSE) 4 MG/3ML ~~LOC~~ SOPN
1.0000 mg | PEN_INJECTOR | SUBCUTANEOUS | 0 refills | Status: DC
  Filled 2021-11-15: qty 3, 28d supply, fill #0

## 2021-11-16 ENCOUNTER — Other Ambulatory Visit (HOSPITAL_COMMUNITY): Payer: Self-pay

## 2021-11-17 ENCOUNTER — Other Ambulatory Visit (HOSPITAL_COMMUNITY): Payer: Self-pay

## 2021-11-24 ENCOUNTER — Other Ambulatory Visit (HOSPITAL_COMMUNITY): Payer: Self-pay

## 2021-11-30 ENCOUNTER — Ambulatory Visit (INDEPENDENT_AMBULATORY_CARE_PROVIDER_SITE_OTHER): Payer: No Typology Code available for payment source | Admitting: Physician Assistant

## 2021-11-30 ENCOUNTER — Encounter: Payer: Self-pay | Admitting: Physician Assistant

## 2021-11-30 ENCOUNTER — Other Ambulatory Visit: Payer: Self-pay | Admitting: Physician Assistant

## 2021-11-30 VITALS — BP 100/60 | HR 73 | Temp 97.3°F | Ht 63.0 in | Wt 177.0 lb

## 2021-11-30 DIAGNOSIS — N3001 Acute cystitis with hematuria: Secondary | ICD-10-CM

## 2021-11-30 DIAGNOSIS — K760 Fatty (change of) liver, not elsewhere classified: Secondary | ICD-10-CM

## 2021-11-30 DIAGNOSIS — Z0000 Encounter for general adult medical examination without abnormal findings: Secondary | ICD-10-CM

## 2021-11-30 DIAGNOSIS — E1169 Type 2 diabetes mellitus with other specified complication: Secondary | ICD-10-CM

## 2021-11-30 DIAGNOSIS — D508 Other iron deficiency anemias: Secondary | ICD-10-CM

## 2021-11-30 DIAGNOSIS — E669 Obesity, unspecified: Secondary | ICD-10-CM

## 2021-11-30 DIAGNOSIS — E559 Vitamin D deficiency, unspecified: Secondary | ICD-10-CM

## 2021-11-30 HISTORY — DX: Encounter for general adult medical examination without abnormal findings: Z00.00

## 2021-11-30 LAB — POCT URINALYSIS DIPSTICK
Glucose, UA: NEGATIVE
Protein, UA: POSITIVE — AB
Spec Grav, UA: >=1.03 — AB (ref 1.010–1.025)
Urobilinogen, UA: 2 E.U./dL — AB
pH, UA: 5.5 (ref 5.0–8.0)

## 2021-11-30 MED ORDER — CIPROFLOXACIN HCL 500 MG PO TABS: 500.0000 mg | ORAL_TABLET | Freq: Two times a day (BID) | ORAL | 0 refills | Status: AC

## 2021-11-30 NOTE — Progress Notes (Signed)
Subjective:  Patient ID: Michelle Lamb, female    DOB: 08-18-1990  Age: 31 y.o. MRN: [id number]  Chief Complaint  Patient presents with   Annual Exam    HPI Well Adult Physical: Patient here for a comprehensive physical exam.The patient reports  earlier this week she had nausea, vomiting and diarrhea along with stomach cramping - has improved somewhat but still with lower abdominal cramping and tenderness Do you take any herbs or supplements that were not prescribed by a doctor? no Are you taking calcium supplements? no Are you taking aspirin daily? no  Encounter for general adult medical examination without abnormal findings  Physical ("At Risk" items are starred): Patient's last physical exam was 1 year ago .  Patient is not afflicted from Stress Incontinence and Urge Incontinence  Patient wears a seat belt, has smoke detectors, has carbon monoxide detectors, practices appropriate gun safety, and wears sunscreen with extended sun exposure. Dental Care: biannual cleanings, brushes and flosses daily. 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  LMP: now Pregnancy history: G4P5 Safe at home: yes Self breast exams: yes Would like to defer flu shot [employer] Office Visit from 11/30/2021 in [place]  PHQ-2 Total Score 1               Social Hx   Social History   Socioeconomic History   Marital status: Married    Spouse name: Not on file   Number of children: 9   Years of education: 13   Highest education level: Associate degree: academic program  Occupational History   Occupation: Physiological scientist   Tobacco Use   Smoking status: Never   Smokeless tobacco: Never  Vaping Use   Vaping Use: Never used  Substance and Sexual Activity   Alcohol use: Yes    Comment: OCCASIONAL   Drug use: No   Sexual activity: Yes    Partners: Male    Birth control/protection: Surgical    Comment: tubal  Other Topics Concern   Not on file  Social History Narrative   Not on file   Social Determinants of Health   Financial Resource Strain: Not on file  Food Insecurity: Not on file  Transportation Needs: Not on file  Physical Activity: Not on file  Stress: Not on file  Social Connections: Not on file   Past Medical History:  Diagnosis Date   ADHD    Anxiety    Asthma    Chronic kidney disease    Complication of anesthesia    Slow to wake up   Depression    Diabetes mellitus without complication ([place])    TYPE 2 LAST DOSE FRIDAY OCTOBER 27 (METFORMIN)   Fatty liver    GERD (gastroesophageal reflux disease)    History of herpes simplex infection    Hyperlipidemia    Hypertension    no meds since gastric   Iron deficiency anemia 09/14/2021   Neoplasm of right kidney    Seasonal allergies    Sleep apnea    Vaginal delivery 2009, 2010, 2015   Vitamin D deficiency    Past Surgical History:  Procedure Laterality Date  CESAREAN SECTION  09/29/2016   CHOLECYSTECTOMY     CYSTOSCOPY W/ URETERAL STENT PLACEMENT Right 07/15/2021   Procedure: CYSTOSCOPY WITH RETROGRADE PYELOGRAM/URETERAL STENT PLACEMENT;  Surgeon: Raynelle Bring, MD;  [facility]: [facility];  Service: Urology;  Laterality: Right;   CYSTOSCOPY WITH RETROGRADE PYELOGRAM, URETEROSCOPY AND STENT PLACEMENT Right 05/03/2021   Procedure: CYSTOSCOPY WITH RIGHT RETROGRADE PYELOGRAM, URETEROSCOPY;  Surgeon: Raynelle Bring, MD;  [facility];  Service: Urology;  Laterality: Right;   DILATION AND CURETTAGE OF UTERUS N/A 12/31/2015   Procedure: SUCTION DILATATION AND CURETTAGE;  Surgeon: Aletha Halim, MD;  Location: [facility];  Service:  Gynecology;  Laterality: N/A;   DILATION AND EVACUATION N/A 01/01/2016   Procedure: DILATATION AND EVACUATION;  Surgeon: Jonnie Kind, MD;  Location: [facility];  Service: Gynecology;  Laterality: N/A;   ESOPHAGOGASTRODUODENOSCOPY     GASTRIC BYPASS  06/02/2020   IR NEPHRO TUBE REMOV/FL  07/16/2021   IR NEPHROSTOMY EXCHANGE RIGHT  05/12/2021   IR NEPHROSTOMY PLACEMENT RIGHT  05/05/2021   ROBOT ASSISTED PYELOPLASTY Right 07/15/2021   Procedure: XI ROBOTIC ASSISTED  LAPAROSCOPIC PYELOPLASTY;  Surgeon: Raynelle Bring, MD;  [facility];  Service: Urology;  Laterality: Right;   ROBOTIC ASSITED PARTIAL NEPHRECTOMY Right 03/15/2021   Procedure: XI ROBOTIC ASSITED PARTIAL NEPHRECTOMY;  Surgeon: Raynelle Bring, MD;  [facility];  Service: Urology;  Laterality: Right;   TONSILLECTOMY     TUBAL LIGATION      Family History  Problem Relation Age of Onset   Hypertension Mother    Thyroid disease Mother    Cancer Maternal Grandmother        stomach cancer   Colon cancer Maternal Grandmother    Depression Maternal Grandfather    Cancer Maternal Grandfather        leukemia   Depression Paternal Grandmother     Review of Systems CONSTITUTIONAL: Negative for chills, fatigue, fever, unintentional weight gain and unintentional weight loss.  E/N/T: Negative for ear pain, nasal congestion and sore throat.  CARDIOVASCULAR: Negative for chest pain, dizziness, palpitations and pedal edema.  RESPIRATORY: Negative for recent cough and dyspnea.  GASTROINTESTINAL:see HPI GU - see HPI MSK: Negative for arthralgias and myalgias.  INTEGUMENTARY: Negative for rash.  NEUROLOGICAL: Negative for dizziness and headaches.  PSYCHIATRIC: Negative for sleep disturbance and to question depression screen.  Negative for depression, negative for anhedonia.       Objective:   PHYSICAL EXAM:   VS: BP 100/60 (BP Location: Left Arm, Patient Position: Sitting, Cuff Size: Large)   Pulse 73   Temp (!) 97.3 F (36.3  C) (Temporal)   Ht '5\' 3"'$  (1.6 m)   Wt 177 lb (80.3 kg)   SpO2 100%   BMI 31.35 kg/m   GEN: Well nourished, well developed, in no acute distress  HEENT: normal external ears and nose - normal external auditory canals and TMS - hearing grossly normal - - Lips, Teeth and Gums - normal  Oropharynx - normal mucosa, palate, and posterior pharynx Cardiac: RRR; no murmurs, rubs, or gallops,no edema - Respiratory:  normal respiratory rate and pattern with no distress - normal breath sounds with no rales, rhonchi, wheezes or rubs GI: normal bowel sounds, mild tenderness to lower abdomen - no guarding or rebound MS: no deformity or atrophy  Skin: warm and dry, no rash  Neuro:  Alert and Oriented x 3, - CN II-Xii grossly intact Psych: euthymic mood, appropriate affect and demeanor  Office Visit on 11/30/2021  Component Date  Value Ref Range Status   Glucose, UA 11/30/2021 Negative  Negative Final   Bilirubin, UA 11/30/2021 2+   Final   Ketones, UA 11/30/2021 trace   Final   Spec Grav, UA 11/30/2021 >=1.030 (A)  1.010 - 1.025 Final   Blood, UA 11/30/2021 Large   Final   pH, UA 11/30/2021 5.5  5.0 - 8.0 Final   Protein, UA 11/30/2021 Positive (A)  Negative Final   Urobilinogen, UA 11/30/2021 2.0 (A)  0.2 or 1.0 E.U./dL Final   Nitrite, UA 11/30/2021 +   Final   Leukocytes, UA 11/30/2021 Large (3+) (A)  Negative Final    Lab Results  Component Value Date   WBC 8.2 11/11/2021   HGB 12.3 11/11/2021   HCT 37 11/11/2021   PLT 267 11/11/2021   GLUCOSE 99 11/02/2021   CHOL 162 09/08/2021   TRIG 60 09/08/2021   HDL 59 09/08/2021   LDLCALC 91 09/08/2021   ALT 11 11/02/2021   AST 12 11/02/2021   NA 140 11/02/2021   K 4.1 11/02/2021   CL 107 (H) 11/02/2021   CREATININE 0.60 11/02/2021   BUN 11 11/02/2021   CO2 21 11/02/2021   TSH 1.260 09/08/2021   INR 1.0 05/05/2021   HGBA1C 5.1 07/07/2021      Assessment & Plan:   Problem List Items Addressed This Visit       Genitourinary    Acute cystitis with hematuria   Relevant Medications   ciprofloxacin (CIPRO) 500 MG tablet   Other Relevant Orders   Urine Culture     Other   Annual physical exam - Primary   Relevant Orders   POCT urinalysis dipstick (Completed)      Body mass index is 31.35 kg/m.   These are the goals we discussed:  Goals   None      This is a list of the screening recommended for you and due dates:  Health Maintenance  Topic Date Due   Eye exam for diabetics  07/09/2021   Yearly kidney health urinalysis for diabetes  04/01/2022*   Flu Shot  05/29/2022*   Yearly kidney function blood test for diabetes  11/03/2022   Pap Smear  09/24/2023   Tetanus Vaccine  08/11/2026   HPV Vaccine  Aged Out   Complete foot exam   Discontinued   Hemoglobin A1C  Discontinued   COVID-19 Vaccine  Discontinued   Hepatitis C Screening: USPSTF Recommendation to screen - Ages 30-79 yo.  Discontinued   HIV Screening  Discontinued  *Topic was postponed. The date shown is not the original due date.     Meds ordered this encounter  Medications   ciprofloxacin (CIPRO) 500 MG tablet    Sig: Take 1 tablet (500 mg total) by mouth 2 (two) times daily for 10 days.    Dispense:  20 tablet    Refill:  0    Order Specific Question:   Supervising Provider    AnswerShelton Silvas    Follow-up: No follow-ups on file.  An After Visit Summary was printed and given to the patient.  Yetta Flock [facility] [phone]

## 2021-12-01 LAB — COMPREHENSIVE METABOLIC PANEL
ALT: 9 IU/L (ref 0–32)
AST: 13 IU/L (ref 0–40)
Albumin/Globulin Ratio: 2 (ref 1.2–2.2)
Albumin: 4.5 g/dL (ref 3.9–4.9)
Alkaline Phosphatase: 64 IU/L (ref 44–121)
BUN/Creatinine Ratio: 18 (ref 9–23)
BUN: 13 mg/dL (ref 6–20)
Bilirubin Total: 0.7 mg/dL (ref 0.0–1.2)
CO2: 22 mmol/L (ref 20–29)
Calcium: 9.2 mg/dL (ref 8.7–10.2)
Chloride: 102 mmol/L (ref 96–106)
Creatinine, Ser: 0.71 mg/dL (ref 0.57–1.00)
Globulin, Total: 2.3 g/dL (ref 1.5–4.5)
Glucose: 70 mg/dL (ref 70–99)
Potassium: 4.1 mmol/L (ref 3.5–5.2)
Sodium: 140 mmol/L (ref 134–144)
Total Protein: 6.8 g/dL (ref 6.0–8.5)
eGFR: 115 mL/min/{1.73_m2} (ref >59)

## 2021-12-01 LAB — CBC WITH DIFFERENTIAL/PLATELET
Basophils Absolute: 0.1 10*3/uL (ref 0.0–0.2)
Basos: 1 %
EOS (ABSOLUTE): 0.8 10*3/uL — ABNORMAL HIGH (ref 0.0–0.4)
Eos: 8 %
Hematocrit: 38.3 % (ref 34.0–46.6)
Hemoglobin: 12.7 g/dL (ref 11.1–15.9)
Immature Grans (Abs): 0.1 10*3/uL (ref 0.0–0.1)
Immature Granulocytes: 1 %
Lymphocytes Absolute: 3.3 10*3/uL — ABNORMAL HIGH (ref 0.7–3.1)
Lymphs: 32 %
MCH: 27.9 pg (ref 26.6–33.0)
MCHC: 33.2 g/dL (ref 31.5–35.7)
MCV: 84 fL (ref 79–97)
Monocytes Absolute: 0.5 10*3/uL (ref 0.1–0.9)
Monocytes: 5 %
Neutrophils Absolute: 5.4 10*3/uL (ref 1.4–7.0)
Neutrophils: 53 %
Platelets: 233 10*3/uL (ref 150–450)
RBC: 4.55 x10E6/uL (ref 3.77–5.28)
RDW: 15.7 % — ABNORMAL HIGH (ref 11.7–15.4)
WBC: 10.2 10*3/uL (ref 3.4–10.8)

## 2021-12-01 LAB — LIPID PANEL
Chol/HDL Ratio: 2.7 ratio (ref 0.0–4.4)
Cholesterol, Total: 189 mg/dL (ref 100–199)
HDL: 70 mg/dL (ref >39)
LDL Chol Calc (NIH): 103 mg/dL — ABNORMAL HIGH (ref 0–99)
Triglycerides: 88 mg/dL (ref 0–149)
VLDL Cholesterol Cal: 16 mg/dL (ref 5–40)

## 2021-12-01 LAB — VITAMIN D 25 HYDROXY (VIT D DEFICIENCY, FRACTURES): Vit D, 25-Hydroxy: 63.6 ng/mL (ref 30.0–100.0)

## 2021-12-01 LAB — CARDIOVASCULAR RISK ASSESSMENT

## 2021-12-01 LAB — HEMOGLOBIN A1C
Est. average glucose Bld gHb Est-mCnc: 97 mg/dL
Hgb A1c MFr Bld: 5 % (ref 4.8–5.6)

## 2021-12-02 ENCOUNTER — Other Ambulatory Visit: Payer: Self-pay | Admitting: Physician Assistant

## 2021-12-02 DIAGNOSIS — R1032 Left lower quadrant pain: Secondary | ICD-10-CM

## 2021-12-02 LAB — URINE CULTURE

## 2021-12-02 MED ORDER — DICYCLOMINE HCL 20 MG PO TABS: 20.0000 mg | ORAL_TABLET | Freq: Three times a day (TID) | ORAL | 1 refills | Status: DC

## 2021-12-03 ENCOUNTER — Ambulatory Visit (HOSPITAL_BASED_OUTPATIENT_CLINIC_OR_DEPARTMENT_OTHER)
Admission: RE | Admit: 2021-12-03 | Discharge: 2021-12-03 | Disposition: A | Discharge status: Home / Self Care | Payer: No Typology Code available for payment source | Source: Ambulatory Visit | Attending: Physician Assistant | Admitting: Physician Assistant

## 2021-12-03 ENCOUNTER — Encounter (HOSPITAL_BASED_OUTPATIENT_CLINIC_OR_DEPARTMENT_OTHER): Payer: Self-pay | Admitting: Radiology

## 2021-12-03 DIAGNOSIS — R1032 Left lower quadrant pain: Secondary | ICD-10-CM | POA: Insufficient documentation

## 2021-12-03 MED ORDER — IOHEXOL 300 MG/ML  SOLN
100.0000 mL | Freq: Once | INTRAMUSCULAR | Status: AC | PRN
  Administered 2021-12-03: 100 mL via INTRAVENOUS

## 2021-12-07 ENCOUNTER — Ambulatory Visit (INDEPENDENT_AMBULATORY_CARE_PROVIDER_SITE_OTHER): Payer: No Typology Code available for payment source

## 2021-12-07 DIAGNOSIS — Z23 Encounter for immunization: Secondary | ICD-10-CM

## 2021-12-17 ENCOUNTER — Other Ambulatory Visit: Payer: Self-pay

## 2021-12-17 DIAGNOSIS — E1169 Type 2 diabetes mellitus with other specified complication: Secondary | ICD-10-CM

## 2021-12-17 DIAGNOSIS — F900 Attention-deficit hyperactivity disorder, predominantly inattentive type: Secondary | ICD-10-CM

## 2021-12-17 DIAGNOSIS — F418 Other specified anxiety disorders: Secondary | ICD-10-CM

## 2021-12-17 DIAGNOSIS — D513 Other dietary vitamin B12 deficiency anemia: Secondary | ICD-10-CM

## 2021-12-17 DIAGNOSIS — K760 Fatty (change of) liver, not elsewhere classified: Secondary | ICD-10-CM

## 2021-12-17 DIAGNOSIS — R112 Nausea with vomiting, unspecified: Secondary | ICD-10-CM

## 2021-12-17 MED ORDER — ONDANSETRON 4 MG PO TBDP: 4.0000 mg | ORAL_TABLET | Freq: Three times a day (TID) | ORAL | 1 refills | Status: DC | PRN

## 2021-12-17 MED ORDER — LISDEXAMFETAMINE DIMESYLATE 30 MG PO CAPS: 30.0000 mg | ORAL_CAPSULE | Freq: Every day | ORAL | 0 refills | Status: DC

## 2021-12-17 MED ORDER — SEMAGLUTIDE (1 MG/DOSE) 4 MG/3ML ~~LOC~~ SOPN: 1.0000 mg | PEN_INJECTOR | SUBCUTANEOUS | 2 refills | Status: DC

## 2021-12-17 MED ORDER — LORAZEPAM 1 MG PO TABS: 1.0000 mg | ORAL_TABLET | Freq: Every day | ORAL | 1 refills | Status: DC | PRN

## 2021-12-17 MED ORDER — CYANOCOBALAMIN 1000 MCG/ML IJ SOLN: INTRAMUSCULAR | 2 refills | Status: DC

## 2021-12-23 ENCOUNTER — Other Ambulatory Visit: Payer: Self-pay | Admitting: Physician Assistant

## 2021-12-23 ENCOUNTER — Ambulatory Visit (INDEPENDENT_AMBULATORY_CARE_PROVIDER_SITE_OTHER): Payer: No Typology Code available for payment source

## 2021-12-23 DIAGNOSIS — H2000 Unspecified acute and subacute iridocyclitis: Secondary | ICD-10-CM

## 2021-12-23 LAB — POCT URINALYSIS DIP (CLINITEK)
Blood, UA: NEGATIVE
Glucose, UA: NEGATIVE mg/dL
Nitrite, UA: POSITIVE — AB
POC PROTEIN,UA: 30 — AB
Spec Grav, UA: >=1.03 — AB (ref 1.010–1.025)
Urobilinogen, UA: 0.2 E.U./dL
pH, UA: 5.5 (ref 5.0–8.0)

## 2021-12-23 MED ORDER — NITROFURANTOIN MONOHYD MACRO 100 MG PO CAPS: 100.0000 mg | ORAL_CAPSULE | Freq: Two times a day (BID) | ORAL | 0 refills | Status: DC

## 2021-12-28 LAB — URINE CULTURE

## 2022-01-06 ENCOUNTER — Ambulatory Visit (INDEPENDENT_AMBULATORY_CARE_PROVIDER_SITE_OTHER): Payer: No Typology Code available for payment source | Admitting: Radiology

## 2022-01-06 ENCOUNTER — Ambulatory Visit: Payer: No Typology Code available for payment source | Admitting: Radiology

## 2022-01-06 DIAGNOSIS — N920 Excessive and frequent menstruation with regular cycle: Secondary | ICD-10-CM | POA: Diagnosis not present

## 2022-01-06 MED ORDER — JUNEL FE 24 1-20 MG-MCG(24) PO TABS: 1.0000 | ORAL_TABLET | Freq: Every day | ORAL | 2 refills | Status: DC

## 2022-01-06 NOTE — Progress Notes (Signed)
   Michelle Lamb 03/05/1990 [id number]   History:  31 y.o. G7P4 presents for follow up after being on Junel x 3 months, Periods have lightened significantly, still has some cramping. BP normal, hx of non alcoholic fatty liver, LFTs were normal when checked last month with PCP.  Gynecologic History Patient's last menstrual period was 10/29/2021 (approximate).     Obstetric History OB History  Gravida Para Term Preterm AB Living  '7 4 3   1 4  '$ SAB IAB Ectopic Multiple Live Births  1       4    # Outcome Date GA Lbr Len/2nd Weight Sex Delivery Anes PTL Lv  7 Term 02/12/14 [redacted]w[redacted]d 6 lb 12 oz (3.062 kg) F Vag-Spont EPI N LIV  6 SAB 2015          5 Term 08/06/08 357w0d6 lb 5 oz (2.863 kg) F Vag-Spont EPI N LIV  4 Term 04/02/07 389w0d lb 5 oz (2.41 kg) F Vag-Spont EPI Y LIV     Complications: Preeclampsia, Diabetes mellitus complicating pregnancy  3 Para           2 Gravida           1 Gravida             Obstetric Comments  H/o GDM and HTN in her last pregnancy     The following portions of the patient's history were reviewed and updated as appropriate: allergies, current medications, past family history, past medical history, past social history, past surgical history, and problem list.  Review of Systems Pertinent items noted in HPI and remainder of comprehensive ROS otherwise negative.   Past medical history, past surgical history, family history and social history were all reviewed and documented in the EPIC chart.   Exam:  Vitals:   01/06/22 0930  BP: 116/70  Weight: 174 lb (78.9 kg)  Height: '5\' 3"'$  (1.6 m)   Body mass index is 30.82 kg/m.  Physical Exam   Assessment/Plan:   1. Menorrhagia with regular cycle  - Norethindrone Acetate-Ethinyl Estrad-FE (JUNEL FE 24) 1-20 MG-MCG(24) tablet; Take 1 tablet by mouth daily.  Dispense: 84 tablet; Refill: 2   AEX due 08/2022  CHRRubbie BattiestWHNP-BC 9:57 AM 01/06/2022

## 2022-01-07 ENCOUNTER — Other Ambulatory Visit (HOSPITAL_COMMUNITY): Payer: Self-pay

## 2022-01-07 ENCOUNTER — Other Ambulatory Visit: Payer: Self-pay | Admitting: Physician Assistant

## 2022-01-07 MED ORDER — OZEMPIC (0.25 OR 0.5 MG/DOSE) 2 MG/3ML ~~LOC~~ SOPN
0.5000 mg | PEN_INJECTOR | SUBCUTANEOUS | 3 refills | Status: DC
  Filled 2022-01-07: qty 3, 28d supply, fill #0

## 2022-01-10 ENCOUNTER — Other Ambulatory Visit: Payer: Self-pay | Admitting: Physician Assistant

## 2022-01-10 ENCOUNTER — Other Ambulatory Visit (HOSPITAL_COMMUNITY): Payer: Self-pay

## 2022-01-10 ENCOUNTER — Telehealth: Payer: Self-pay

## 2022-01-10 MED ORDER — LISDEXAMFETAMINE DIMESYLATE 40 MG PO CAPS: 40.0000 mg | ORAL_CAPSULE | ORAL | 0 refills | Status: DC

## 2022-01-10 NOTE — Telephone Encounter (Signed)
Patient states vyvanse is not longer helping. Can I go up in the dose? Please advice

## 2022-01-18 ENCOUNTER — Other Ambulatory Visit: Payer: Self-pay | Admitting: Physician Assistant

## 2022-01-18 ENCOUNTER — Ambulatory Visit (INDEPENDENT_AMBULATORY_CARE_PROVIDER_SITE_OTHER): Payer: No Typology Code available for payment source

## 2022-01-18 DIAGNOSIS — N3001 Acute cystitis with hematuria: Secondary | ICD-10-CM

## 2022-01-18 LAB — POCT URINALYSIS DIPSTICK
Glucose, UA: NEGATIVE
Nitrite, UA: NEGATIVE
Protein, UA: POSITIVE — AB
Spec Grav, UA: >=1.03 — AB (ref 1.010–1.025)
Urobilinogen, UA: 1 E.U./dL
pH, UA: 5 (ref 5.0–8.0)

## 2022-01-18 MED ORDER — NITROFURANTOIN MONOHYD MACRO 100 MG PO CAPS: 100.0000 mg | ORAL_CAPSULE | Freq: Two times a day (BID) | ORAL | 0 refills | Status: DC

## 2022-01-18 NOTE — Progress Notes (Signed)
Rx sent for macrobid - urine culture pending

## 2022-01-20 LAB — URINE CULTURE

## 2022-01-28 ENCOUNTER — Inpatient Hospital Stay: Payer: 59 | Attending: Hematology and Oncology | Admitting: Genetic Counselor

## 2022-01-28 ENCOUNTER — Inpatient Hospital Stay: Payer: 59

## 2022-01-28 DIAGNOSIS — Z803 Family history of malignant neoplasm of breast: Secondary | ICD-10-CM | POA: Diagnosis not present

## 2022-01-28 DIAGNOSIS — Z806 Family history of leukemia: Secondary | ICD-10-CM

## 2022-01-28 DIAGNOSIS — Z800 Family history of malignant neoplasm of digestive organs: Secondary | ICD-10-CM | POA: Diagnosis not present

## 2022-01-28 NOTE — Progress Notes (Signed)
REFERRING PROVIDER: Marvia Pickles, PA-C [address] [place],  [place] [address]  PRIMARY PROVIDER:  [name], PA-C  PRIMARY REASON FOR VISIT:  Encounter Diagnoses  Name Primary?   Family history of breast cancer Yes   Family history of gastric cancer      HISTORY OF PRESENT ILLNESS:   Michelle Lamb, a 31 y.o. female, was seen for a [place] cancer genetics consultation at the request of Rosanne Sack, PA-C due to a family history of breast and GI cancers.  Michelle Lamb presents to clinic today to discuss the possibility of a hereditary predisposition to cancer, to discuss genetic testing, and to further clarify her future cancer risks, as well as potential cancer risks for family members.   Michelle Lamb is a 31 y.o. female with no personal history of cancer.    CANCER HISTORY:  Oncology History   No history exists.     RISK FACTORS:  Mammogram within the last year: no Number of breast biopsies: 0. Colonoscopy: no; not examined. Hysterectomy: no.  Ovaries intact: yes.  Menarche was at age 44.  First live birth at age 76.  OCP use for less than one year.  Dermatology screening: no  Past Medical History:  Diagnosis Date   ADHD    Anxiety    Asthma    Chronic kidney disease    Complication of anesthesia    Slow to wake up   Depression    Diabetes mellitus without complication ([facility])    TYPE 2 LAST DOSE FRIDAY OCTOBER 27 (METFORMIN)   Fatty liver    GERD (gastroesophageal reflux disease)    History of herpes simplex infection    Hyperlipidemia    Hypertension    no meds since gastric   Iron deficiency anemia 09/14/2021   Neoplasm of right kidney    Seasonal allergies    Sleep apnea    Vaginal delivery 2009, 2010, 2015   Vitamin D deficiency     Past Surgical History:  Procedure Laterality Date   CESAREAN SECTION  09/29/2016   CHOLECYSTECTOMY     CYSTOSCOPY W/ URETERAL STENT PLACEMENT Right 07/15/2021   Procedure: CYSTOSCOPY WITH RETROGRADE PYELOGRAM/URETERAL  STENT PLACEMENT;  Surgeon: Raynelle Bring, MD;  Location: [facility];  Service: Urology;  Laterality: Right;   CYSTOSCOPY WITH RETROGRADE PYELOGRAM, URETEROSCOPY AND STENT PLACEMENT Right 05/03/2021   Procedure: CYSTOSCOPY WITH RIGHT RETROGRADE PYELOGRAM, URETEROSCOPY;  Surgeon: Raynelle Bring, MD;  Location: [facility];  Service: Urology;  Laterality: Right;   DILATION AND CURETTAGE OF UTERUS N/A 12/31/2015   Procedure: SUCTION DILATATION AND CURETTAGE;  Surgeon: Aletha Halim, MD;  Location: [facility];  Service: Gynecology;  Laterality: N/A;   DILATION AND EVACUATION N/A 01/01/2016   Procedure: DILATATION AND EVACUATION;  Surgeon: Jonnie Kind, MD;  Location: [facility];  Service: Gynecology;  Laterality: N/A;   ESOPHAGOGASTRODUODENOSCOPY     GASTRIC BYPASS  06/02/2020   IR NEPHRO TUBE REMOV/FL  07/16/2021   IR NEPHROSTOMY EXCHANGE RIGHT  05/12/2021   IR NEPHROSTOMY PLACEMENT RIGHT  05/05/2021   ROBOT ASSISTED PYELOPLASTY Right 07/15/2021   Procedure: XI ROBOTIC ASSISTED  LAPAROSCOPIC PYELOPLASTY;  Surgeon: Raynelle Bring, MD;  [facility];  Service: Urology;  Laterality: Right;   ROBOTIC ASSITED PARTIAL NEPHRECTOMY Right 03/15/2021   Procedure: XI ROBOTIC ASSITED PARTIAL NEPHRECTOMY;  Surgeon: Raynelle Bring, MD;  [facility];  Service: Urology;  Laterality: Right;   TONSILLECTOMY     TUBAL LIGATION  FAMILY HISTORY:  We obtained a detailed, 4-generation family history.  Significant diagnoses are listed below: Family History  Problem Relation Age of Onset   Cervical cancer Mother        dx 20s   Stomach cancer Maternal Grandmother        dx 50s   Leukemia Maternal Grandfather        d. 50s   Cancer Paternal Grandfather        dx after 50; mets   Stomach cancer Other        MGM's brother; dx after 50   Cancer Other        MGF's sisters x3; unknown primary   Breast cancer Maternal Great-grandmother        dx <50; mets; MGF's mother     Michelle Lamb is unaware of previous family  history of genetic testing for hereditary cancer risks.  Affected relatives or those more closely related to affected relatives are unavailable for genetic testing at this time. There is no reported Ashkenazi Jewish ancestry. There is no known consanguinity.  GENETIC COUNSELING ASSESSMENT: Michelle Lamb is a 31 y.o. female with a family history of cancer which is somewhat suggestive of a hereditary cancer syndrome and predisposition to cancer given the ages of diagnosis and the presence of related cancers in the family. We, therefore, discussed and recommended the following at today's visit.   DISCUSSION: We discussed that 5 - 10% of cancer is hereditary, with most cases of hereditary breast cancer associated with mutations in BRCA1/2.  There are other genes that can be associated with hereditary breast or gastric cancer syndromes.  We discussed that testing is beneficial for several reasons, including knowing about other cancer risks, identifying potential screening and risk-reduction options that may be appropriate, and to understanding if other family members could be at risk for cancer and allowing them to undergo genetic testing.  We reviewed the characteristics, features and inheritance patterns of hereditary cancer syndromes. We also discussed genetic testing, including the appropriate family members to test, the process of testing, insurance coverage and turn-around-time for results. We discussed the implications of a negative, positive, and variant of uncertain significant result. We discussed that negative results would be uninformative given that Michelle Lamb does not have a personal history of cancer. We recommended Michelle Lamb pursue genetic testing for a panel that contains genes associated with breast, gastric, and other cancers.  The CancerNext-Expanded gene panel offered by [facility] and includes sequencing, rearrangement, and RNA analysis for the following 77 genes: AIP, ALK, APC, ATM, AXIN2,  BAP1, BARD1, BLM, BMPR1A, BRCA1, BRCA2, BRIP1, CDC73, CDH1, CDK4, CDKN1B, CDKN2A, CHEK2, CTNNA1, DICER1, FANCC, FH, FLCN, GALNT12, KIF1B, LZTR1, MAX, MEN1, MET, MLH1, MSH2, MSH3, MSH6, MUTYH, NBN, NF1, NF2, NTHL1, PALB2, PHOX2B, PMS2, POT1, PRKAR1A, PTCH1, PTEN, RAD51C, RAD51D, RB1, RECQL, RET, SDHA, SDHAF2, SDHB, SDHC, SDHD, SMAD4, SMARCA4, SMARCB1, SMARCE1, STK11, SUFU, TMEM127, TP53, TSC1, TSC2, VHL and XRCC2 (sequencing and deletion/duplication); EGFR, EGLN1, HOXB13, KIT, MITF, PDGFRA, POLD1, and POLE (sequencing only); EPCAM and GREM1 (deletion/duplication only).   Based on Michelle Lamb's family history of cancer, she meets medical criteria for genetic testing.    We discussed the Genetic Information Non-Discrimination Act (GINA) of 2008, which helps protect individuals against genetic discrimination based on their genetic test results.  It impacts both health insurance and employment.  With health insurance, it protects against genetic test results being used for increased premiums or policy termination. For employment, it protects against hiring, firing   and promoting decisions based on genetic test results.  GINA does not apply to those in the military, those who work for companies with less than 15 employees, and new life insurance or long-term disability insurance policies.  Health status due to a cancer diagnosis is not protected under GINA.  PLAN: After considering the risks, benefits, and limitations, Michelle Lamb provided informed consent to pursue genetic testing and the blood sample was sent to [facility] for analysis of the CancerNext-Expanded +RNAinsight Panel. Results should be available within approximately 3 weeks' time, at which point they will be disclosed by telephone to Michelle Lamb, as will any additional recommendations warranted by these results. Michelle Lamb will receive a summary of her genetic counseling visit and a copy of her results once available. This information will also be  available in Epic.   Michelle Lamb questions were answered to her satisfaction today. Our contact information was provided should additional questions or concerns arise. Thank you for the referral and allowing us to share in the care of your patient.   Cari M. Koerner, MS, LCGC Genetic Counselor [email or web address] (P) [phone]   The patient was seen for a total of 25 minutes in face-to-face genetic counseling.  The patient was seen alone.  Dr. McCarty was available to discuss this case as needed.  _______________________________________________________________________ For Office Staff:  Number of people involved in session: 1 Was an Intern/ student involved with case: no   

## 2022-02-01 ENCOUNTER — Encounter: Payer: Self-pay | Admitting: Genetic Counselor

## 2022-02-01 ENCOUNTER — Encounter: Payer: Self-pay | Admitting: Hematology and Oncology

## 2022-02-02 ENCOUNTER — Other Ambulatory Visit: Payer: Self-pay | Admitting: Physician Assistant

## 2022-02-02 ENCOUNTER — Ambulatory Visit (INDEPENDENT_AMBULATORY_CARE_PROVIDER_SITE_OTHER): Payer: No Typology Code available for payment source

## 2022-02-02 DIAGNOSIS — N3090 Cystitis, unspecified without hematuria: Secondary | ICD-10-CM | POA: Diagnosis not present

## 2022-02-02 LAB — POCT URINALYSIS DIP (CLINITEK)
Blood, UA: NEGATIVE
Glucose, UA: NEGATIVE mg/dL
Nitrite, UA: NEGATIVE
Spec Grav, UA: >=1.03 — AB (ref 1.010–1.025)
Urobilinogen, UA: 1 E.U./dL
pH, UA: 5.5 (ref 5.0–8.0)

## 2022-02-04 LAB — URINE CULTURE

## 2022-02-07 ENCOUNTER — Encounter: Payer: Self-pay | Admitting: Hematology and Oncology

## 2022-02-12 ENCOUNTER — Encounter: Payer: Self-pay | Admitting: Hematology and Oncology

## 2022-02-18 ENCOUNTER — Encounter: Payer: Self-pay | Admitting: Nurse Practitioner

## 2022-02-18 ENCOUNTER — Ambulatory Visit (INDEPENDENT_AMBULATORY_CARE_PROVIDER_SITE_OTHER): Payer: No Typology Code available for payment source | Admitting: Nurse Practitioner

## 2022-02-18 ENCOUNTER — Other Ambulatory Visit (HOSPITAL_COMMUNITY): Payer: Self-pay

## 2022-02-18 ENCOUNTER — Other Ambulatory Visit: Payer: Self-pay

## 2022-02-18 VITALS — BP 116/72 | HR 87 | Temp 97.6°F | Ht 63.0 in | Wt 171.6 lb

## 2022-02-18 DIAGNOSIS — N3001 Acute cystitis with hematuria: Secondary | ICD-10-CM | POA: Diagnosis not present

## 2022-02-18 DIAGNOSIS — M545 Low back pain, unspecified: Secondary | ICD-10-CM | POA: Diagnosis not present

## 2022-02-18 DIAGNOSIS — N390 Urinary tract infection, site not specified: Secondary | ICD-10-CM

## 2022-02-18 DIAGNOSIS — F418 Other specified anxiety disorders: Secondary | ICD-10-CM

## 2022-02-18 LAB — COMPREHENSIVE METABOLIC PANEL
ALT: 20 IU/L (ref 0–32)
AST: 17 IU/L (ref 0–40)
Albumin/Globulin Ratio: 2.5 — ABNORMAL HIGH (ref 1.2–2.2)
Albumin: 4.8 g/dL (ref 3.9–4.9)
Alkaline Phosphatase: 75 IU/L (ref 44–121)
BUN/Creatinine Ratio: 21 (ref 9–23)
BUN: 15 mg/dL (ref 6–20)
Bilirubin Total: 0.7 mg/dL (ref 0.0–1.2)
CO2: 21 mmol/L (ref 20–29)
Calcium: 9.6 mg/dL (ref 8.7–10.2)
Chloride: 104 mmol/L (ref 96–106)
Creatinine, Ser: 0.7 mg/dL (ref 0.57–1.00)
Globulin, Total: 1.9 g/dL (ref 1.5–4.5)
Glucose: 93 mg/dL (ref 70–99)
Potassium: 4 mmol/L (ref 3.5–5.2)
Sodium: 141 mmol/L (ref 134–144)
Total Protein: 6.7 g/dL (ref 6.0–8.5)
eGFR: 117 mL/min/{1.73_m2} (ref >59)

## 2022-02-18 LAB — CBC WITH DIFFERENTIAL/PLATELET
Basophils Absolute: 0.1 10*3/uL (ref 0.0–0.2)
Basos: 1 %
EOS (ABSOLUTE): 0.3 10*3/uL (ref 0.0–0.4)
Eos: 3 %
Hematocrit: 39.4 % (ref 34.0–46.6)
Hemoglobin: 13.1 g/dL (ref 11.1–15.9)
Immature Grans (Abs): 0 10*3/uL (ref 0.0–0.1)
Immature Granulocytes: 0 %
Lymphocytes Absolute: 3 10*3/uL (ref 0.7–3.1)
Lymphs: 32 %
MCH: 29.2 pg (ref 26.6–33.0)
MCHC: 33.2 g/dL (ref 31.5–35.7)
MCV: 88 fL (ref 79–97)
Monocytes Absolute: 0.4 10*3/uL (ref 0.1–0.9)
Monocytes: 4 %
Neutrophils Absolute: 5.7 10*3/uL (ref 1.4–7.0)
Neutrophils: 60 %
Platelets: 270 10*3/uL (ref 150–450)
RBC: 4.49 x10E6/uL (ref 3.77–5.28)
RDW: 13.4 % (ref 11.7–15.4)
WBC: 9.5 10*3/uL (ref 3.4–10.8)

## 2022-02-18 LAB — POCT URINALYSIS DIPSTICK
Glucose, UA: POSITIVE — AB
Nitrite, UA: POSITIVE
Protein, UA: POSITIVE — AB
Spec Grav, UA: >=1.03 — AB (ref 1.010–1.025)
Urobilinogen, UA: 2 E.U./dL — AB
pH, UA: 5 (ref 5.0–8.0)

## 2022-02-18 MED ORDER — LORAZEPAM 1 MG PO TABS: 1.0000 mg | ORAL_TABLET | Freq: Every day | ORAL | 1 refills | Status: DC | PRN

## 2022-02-18 MED ORDER — CIPROFLOXACIN HCL 500 MG PO TABS: 500.0000 mg | ORAL_TABLET | Freq: Two times a day (BID) | ORAL | 0 refills | Status: AC

## 2022-02-18 MED ORDER — LISDEXAMFETAMINE DIMESYLATE 40 MG PO CAPS: 40.0000 mg | ORAL_CAPSULE | ORAL | 0 refills | Status: DC

## 2022-02-18 NOTE — Progress Notes (Signed)
Acute Office Visit  Subjective:    Patient ID: Michelle Lamb, female    DOB: 03-01-1990, 31 y.o.   MRN: [id number]  Chief Complaint  Patient presents with   Back Pain    lower    HPI: Patient here today with complain of lower back pain.   Back Pain  She reports recurrent back pain. There was not an injury that may have caused the pain. The most recent episode started about a week ago and is gradually worsening. The pain is located in the right lumbar area or left lumbar areawithout radiation. It is described as sharp and throbbing, is 8/10 in intensity, occurring constantly. Symptoms are worse in the: mid-day  Aggravating factors: bending backwards, bending forwards, and bending sideways Relieving factors: medication Advil and heating pad. Pt has chronic UTIs, recent right renal cyst removal. She has tried application of heat and NSAIDs with little relief.   Associated symptoms: Yes abdominal pain No bowel incontinence  No chest pain No dysuria   No fever No headaches  No joint pains Yes pelvic pain  No weakness in leg  No tingling in lower extremities  No urinary incontinence No weight loss     Past Medical History:  Diagnosis Date   ADHD    Anxiety    Asthma    Chronic kidney disease    Complication of anesthesia    Slow to wake up   Depression    Diabetes mellitus without complication ([facility])    TYPE 2 LAST DOSE FRIDAY OCTOBER 27 (METFORMIN)   Fatty liver    GERD (gastroesophageal reflux disease)    History of herpes simplex infection    Hyperlipidemia    Hypertension    no meds since gastric   Iron deficiency anemia 09/14/2021   Neoplasm of right kidney    Seasonal allergies    Sleep apnea    Vaginal delivery 2009, 2010, 2015   Vitamin D deficiency     Past Surgical History:  Procedure Laterality Date   CESAREAN SECTION  09/29/2016   CHOLECYSTECTOMY     CYSTOSCOPY W/ URETERAL STENT PLACEMENT Right 07/15/2021   Procedure: CYSTOSCOPY WITH  RETROGRADE PYELOGRAM/URETERAL STENT PLACEMENT;  Surgeon: Raynelle Bring, MD;  [facility];  Service: Urology;  Laterality: Right;   CYSTOSCOPY WITH RETROGRADE PYELOGRAM, URETEROSCOPY AND STENT PLACEMENT Right 05/03/2021   Procedure: CYSTOSCOPY WITH RIGHT RETROGRADE PYELOGRAM, URETEROSCOPY;  Surgeon: Raynelle Bring, MD;  [facility];  Service: Urology;  Laterality: Right;   DILATION AND CURETTAGE OF UTERUS N/A 12/31/2015   Procedure: SUCTION DILATATION AND CURETTAGE;  Surgeon: Aletha Halim, MD;  Location: [facility];  Service: Gynecology;  Laterality: N/A;   DILATION AND EVACUATION N/A 01/01/2016   Procedure: DILATATION AND EVACUATION;  Surgeon: Jonnie Kind, MD;  Location: [facility];  Service: Gynecology;  Laterality: N/A;   ESOPHAGOGASTRODUODENOSCOPY     GASTRIC BYPASS  06/02/2020   IR NEPHRO TUBE REMOV/FL  07/16/2021   IR NEPHROSTOMY EXCHANGE RIGHT  05/12/2021   IR NEPHROSTOMY PLACEMENT RIGHT  05/05/2021   ROBOT ASSISTED PYELOPLASTY Right 07/15/2021   Procedure: XI ROBOTIC ASSISTED  LAPAROSCOPIC PYELOPLASTY;  Surgeon: Raynelle Bring, MD;  [facility];  Service: Urology;  Laterality: Right;   ROBOTIC ASSITED PARTIAL NEPHRECTOMY Right 03/15/2021   Procedure: XI ROBOTIC ASSITED PARTIAL NEPHRECTOMY;  Surgeon: Raynelle Bring, MD;  [facility];  Service: Urology;  Laterality: Right;   TONSILLECTOMY     TUBAL LIGATION      Family  History  Problem Relation Age of Onset   Hypertension Mother    Thyroid disease Mother    Cervical cancer Mother        dx 72s   Stomach cancer Maternal Grandmother        dx 53s   Leukemia Maternal Grandfather        d. 50s   Depression Paternal Grandmother    Cancer Paternal Grandfather        dx after 1; mets   Stomach cancer Other        MGM's brother; dx after 17   Cancer Other        MGF's sisters x3; unknown primary   Breast cancer Maternal Great-grandmother        dx <50; mets; MGF's mother    Social History   Socioeconomic History    Marital status: Married    Spouse name: Not on file   Number of children: 9   Years of education: 13   Highest education level: Futures trader degree: academic program  Occupational History   Occupation: Physiological scientist  Tobacco Use   Smoking status: Never   Smokeless tobacco: Never  Scientific laboratory technician Use: Never used  Substance and Sexual Activity   Alcohol use: Yes    Comment: OCCASIONAL   Drug use: No   Sexual activity: Yes    Partners: Male    Birth control/protection: Surgical    Comment: tubal  Other Topics Concern   Not on file  Social History Narrative   Not on file   Social Determinants of Health   Financial Resource Strain: Not on file  Food Insecurity: Not on file  Transportation Needs: Not on file  Physical Activity: Not on file  Stress: Not on file  Social Connections: Not on file  Intimate Partner Violence: Not on file    Outpatient Medications Prior to Visit  Medication Sig Dispense Refill   albuterol (VENTOLIN HFA) 108 (90 Base) MCG/ACT inhaler Inhale 2 puffs into the lungs every 6 (six) hours as needed for wheezing or shortness of breath. 1 each 5   budesonide-formoterol (SYMBICORT) 80-4.5 MCG/ACT inhaler Inhale 2 puffs into the lungs daily as needed (wheezing).     cyanocobalamin (VITAMIN B12) 1000 MCG/ML injection Inject 1 ML as directed once a month 1 mL 2   cyclobenzaprine (FLEXERIL) 10 MG tablet Take 1 tablet (10 mg total) by mouth 3 (three) times daily as needed for muscle spasms. 30 tablet 0   dicyclomine (BENTYL) 20 MG tablet Take 1 tablet (20 mg total) by mouth 3 (three) times daily before meals. 30 tablet 1   docusate sodium (COLACE) 100 MG capsule Take 1 capsule (100 mg total) by mouth 2 (two) times daily. (Patient taking differently: Take 100 mg by mouth daily.)     lisdexamfetamine (VYVANSE) 40 MG capsule Take 1 capsule (40 mg total) by mouth every morning. 30 capsule 0   LORazepam (ATIVAN) 1 MG tablet Take 1 tablet (1 mg total) by  mouth daily as needed. 30 tablet 1   Norethindrone Acetate-Ethinyl Estrad-FE (JUNEL FE 24) 1-20 MG-MCG(24) tablet Take 1 tablet by mouth daily. 84 tablet 2   omeprazole (PRILOSEC) 20 MG capsule TAKE 1 CAPSULE TWICE DAILY 180 capsule 1   ondansetron (ZOFRAN-ODT) 4 MG disintegrating tablet Take 1 tablet (4 mg total) by mouth every 8 (eight) hours as needed for nausea or vomiting. 90 tablet 1   Semaglutide,0.25 or 0.5MG/DOS, (OZEMPIC, 0.25 OR 0.5 MG/DOSE,) 2 MG/3ML  SOPN Inject 0.5 mg into the skin once a week. 3 mL 3   Vitamin D, Ergocalciferol, (DRISDOL) 1.25 MG (50000 UNIT) CAPS capsule Take 1 capsule by mouth 3 times weekly 36 capsule 1   nitrofurantoin, macrocrystal-monohydrate, (MACROBID) 100 MG capsule Take 1 capsule (100 mg total) by mouth 2 (two) times daily. 20 capsule 0   No facility-administered medications prior to visit.    No Known Allergies  Review of Systems  Constitutional:  Negative for chills, fatigue and fever.  HENT:  Negative for congestion, ear pain, sinus pain and sore throat.   Respiratory:  Negative for cough and shortness of breath.   Cardiovascular:  Negative for chest pain.  Musculoskeletal:  Negative for myalgias.  Neurological:  Negative for headaches.       Objective:    Physical Exam Vitals reviewed.  Constitutional:      Appearance: Normal appearance.  Abdominal:     Tenderness: There is no abdominal tenderness. There is no right CVA tenderness or left CVA tenderness.  Skin:    General: Skin is warm.     Capillary Refill: Capillary refill takes less than 2 seconds.  Neurological:     Mental Status: She is alert and oriented to person, place, and time.  Psychiatric:        Mood and Affect: Mood normal.        Behavior: Behavior normal.     BP 116/72 (BP Location: Right Arm, Patient Position: Sitting, Cuff Size: Normal)   Pulse 87   Temp 97.6 F (36.4 C) (Temporal)   Ht _0  (1.6 m)   Wt 171 lb 9.6 oz (77.8 kg)   SpO2 100%   BMI 30.40  kg/m  Wt Readings from Last 3 Encounters:  02/18/22 171 lb 9.6 oz (77.8 kg)  01/06/22 174 lb (78.9 kg)  11/30/21 177 lb (80.3 kg)    Health Maintenance Due  Topic Date Due   OPHTHALMOLOGY EXAM  07/09/2021       Lab Results  Component Value Date   TSH 1.260 09/08/2021   Lab Results  Component Value Date   WBC 10.2 11/30/2021   HGB 12.7 11/30/2021   HCT 38.3 11/30/2021   MCV 84 11/30/2021   PLT 233 11/30/2021   Lab Results  Component Value Date   NA 140 11/30/2021   K 4.1 11/30/2021   CO2 22 11/30/2021   GLUCOSE 70 11/30/2021   BUN 13 11/30/2021   CREATININE 0.71 11/30/2021   BILITOT 0.7 11/30/2021   ALKPHOS 64 11/30/2021   AST 13 11/30/2021   ALT 9 11/30/2021   PROT 6.8 11/30/2021   ALBUMIN 4.5 11/30/2021   CALCIUM 9.2 11/30/2021   ANIONGAP 4 (L) 07/17/2021   EGFR 115 11/30/2021   Lab Results  Component Value Date   CHOL 189 11/30/2021   Lab Results  Component Value Date   HDL 70 11/30/2021   Lab Results  Component Value Date   LDLCALC 103 (H) 11/30/2021   Lab Results  Component Value Date   TRIG 88 11/30/2021   Lab Results  Component Value Date   CHOLHDL 2.7 11/30/2021   Lab Results  Component Value Date   HGBA1C 5.0 11/30/2021       Assessment & Plan:   1. Acute cystitis with hematuria - ciprofloxacin (CIPRO) 500 MG tablet; Take 1 tablet (500 mg total) by mouth 2 (two) times daily for 5 days.  Dispense: 10 tablet; Refill: 0 - Urine Culture  2. Acute low  back pain, unspecified back pain laterality, unspecified whether sciatica present - POCT urinalysis dipstick  3. Recurrent UTI - ciprofloxacin (CIPRO) 500 MG tablet; Take 1 tablet (500 mg total) by mouth 2 (two) times daily for 5 days.  Dispense: 10 tablet; Refill: 0 - Ambulatory referral to Urology - CBC with Differential/Platelet - Comprehensive metabolic panel - Urine Culture   Rest and push fluids, especially water Avoid holding urine Avoid shower gels, use Dove bar  soap Void immediately after intimacy We will call you with lab results     Follow-up: PRN  An After Visit Summary was printed and given to the patient.  I, Rip Harbour, NP, have reviewed all documentation for this visit. The documentation on 02/18/22 for the exam, diagnosis, procedures, and orders are all accurate and complete.    Signed, Rip Harbour, NP [facility] [phone]

## 2022-02-18 NOTE — Patient Instructions (Addendum)
Rest and push fluids, especially water Avoid holding urine Avoid shower gels, use Dove bar soap Void immediately after intimacy We will call you with lab results  Urinary Tract Infection, Adult A urinary tract infection (UTI) is an infection of any part of the urinary tract. The urinary tract includes: The kidneys. The ureters. The bladder. The urethra. These organs make, store, and get rid of pee (urine) in the body. What are the causes? This infection is caused by germs (bacteria) in your genital area. These germs grow and cause swelling (inflammation) of your urinary tract. What increases the risk? The following factors may make you more likely to develop this condition: Using a small, thin tube (catheter) to drain pee. Not being able to control when you pee or poop (incontinence). Being female. If you are female, these things can increase the risk: Using these methods to prevent pregnancy: A medicine that kills sperm (spermicide). A device that blocks sperm (diaphragm). Having low levels of a female hormone (estrogen). Being pregnant. You are more likely to develop this condition if: You have genes that add to your risk. You are sexually active. You take antibiotic medicines. You have trouble peeing because of: A prostate that is bigger than normal, if you are female. A blockage in the part of your body that drains pee from the bladder. A kidney stone. A nerve condition that affects your bladder. Not getting enough to drink. Not peeing often enough. You have other conditions, such as: Diabetes. A weak disease-fighting system (immune system). Sickle cell disease. Gout. Injury of the spine. What are the signs or symptoms? Symptoms of this condition include: Needing to pee right away. Peeing small amounts often. Pain or burning when peeing. Blood in the pee. Pee that smells bad or not like normal. Trouble peeing. Pee that is cloudy. Fluid coming from the vagina, if  you are female. Pain in the belly or lower back. Other symptoms include: Vomiting. Not feeling hungry. Feeling mixed up (confused). This may be the first symptom in older adults. Being tired and grouchy (irritable). A fever. Watery poop (diarrhea). How is this treated? Taking antibiotic medicine. Taking other medicines. Drinking enough water. In some cases, you may need to see a specialist. Follow these instructions at home:  Medicines Take over-the-counter and prescription medicines only as told by your doctor. If you were prescribed an antibiotic medicine, take it as told by your doctor. Do not stop taking it even if you start to feel better. General instructions Make sure you: Pee until your bladder is empty. Do not hold pee for a long time. Empty your bladder after sex. Wipe from front to back after peeing or pooping if you are a female. Use each tissue one time when you wipe. Drink enough fluid to keep your pee pale yellow. Keep all follow-up visits. Contact a doctor if: You do not get better after 1-2 days. Your symptoms go away and then come back. Get help right away if: You have very bad back pain. You have very bad pain in your lower belly. You have a fever. You have chills. You feeling like you will vomit or you vomit. Summary A urinary tract infection (UTI) is an infection of any part of the urinary tract. This condition is caused by germs in your genital area. There are many risk factors for a UTI. Treatment includes antibiotic medicines. Drink enough fluid to keep your pee pale yellow. This information is not intended to replace advice given to you  by your health care provider. Make sure you discuss any questions you have with your health care provider. Document Revised: 09/27/2019 Document Reviewed: 09/27/2019 Elsevier Patient Education  [place].

## 2022-02-22 LAB — URINE CULTURE

## 2022-02-23 ENCOUNTER — Other Ambulatory Visit: Payer: Self-pay

## 2022-02-23 MED ORDER — LISDEXAMFETAMINE DIMESYLATE 40 MG PO CAPS
40.0000 mg | ORAL_CAPSULE | ORAL | 0 refills | Status: DC
  Filled 2022-02-23: qty 30, 30d supply, fill #0

## 2022-02-24 ENCOUNTER — Other Ambulatory Visit: Payer: Self-pay

## 2022-02-24 ENCOUNTER — Other Ambulatory Visit (HOSPITAL_COMMUNITY): Payer: Self-pay

## 2022-02-28 DIAGNOSIS — R42 Dizziness and giddiness: Secondary | ICD-10-CM

## 2022-02-28 HISTORY — DX: Dizziness and giddiness: R42

## 2022-03-02 ENCOUNTER — Encounter: Payer: Self-pay | Admitting: Hematology and Oncology

## 2022-03-02 ENCOUNTER — Encounter: Payer: Self-pay | Admitting: Physician Assistant

## 2022-03-02 ENCOUNTER — Ambulatory Visit (INDEPENDENT_AMBULATORY_CARE_PROVIDER_SITE_OTHER): Payer: 59 | Admitting: Physician Assistant

## 2022-03-02 VITALS — BP 120/74 | HR 90 | Temp 99.5°F | Resp 14 | Ht 63.0 in | Wt 170.0 lb

## 2022-03-02 DIAGNOSIS — B349 Viral infection, unspecified: Secondary | ICD-10-CM | POA: Insufficient documentation

## 2022-03-02 DIAGNOSIS — R5381 Other malaise: Secondary | ICD-10-CM

## 2022-03-02 HISTORY — DX: Viral infection, unspecified: B34.9

## 2022-03-02 LAB — POCT URINALYSIS DIP (CLINITEK)
Blood, UA: NEGATIVE
Glucose, UA: NEGATIVE mg/dL
Nitrite, UA: NEGATIVE
Spec Grav, UA: >=1.03 — AB (ref 1.010–1.025)
Urobilinogen, UA: 1 E.U./dL
pH, UA: 6 (ref 5.0–8.0)

## 2022-03-02 LAB — POCT INFLUENZA A/B
Influenza A, POC: NEGATIVE
Influenza B, POC: NEGATIVE

## 2022-03-02 LAB — POC COVID19 BINAXNOW: SARS Coronavirus 2 Ag: NEGATIVE

## 2022-03-02 NOTE — Progress Notes (Signed)
Acute Office Visit  Subjective:    Patient ID: Michelle Lamb, female    DOB: June 06, 1990, 32 y.o.   MRN: [id number]  Chief Complaint  Patient presents with   Fatigue    HPI: Patient is in today for complaints of malaise and low grade fever - she states yesterday she felt slightly sluggish and today feels fatigued and has temp of 99.5 Denies cough, congestion, rash Denies urine symptoms - has appt 1/12 with urology Denies vomiting - has had some nausea and decreased appetite  Past Medical History:  Diagnosis Date   ADHD    Anxiety    Asthma    Chronic kidney disease    Complication of anesthesia    Slow to wake up   Depression    Diabetes mellitus without complication ([place])    TYPE 2 LAST DOSE FRIDAY OCTOBER 27 ([facility]FORMIN)   Fatty liver    GERD (gastroesophageal reflux disease)    History of herpes simplex infection    Hyperlipidemia    Hypertension    no meds since gastric   Iron deficiency anemia 09/14/2021   Neoplasm of right kidney    Seasonal allergies    Sleep apnea    Vaginal delivery 2009, 2010, 2015   Vitamin D deficiency     Past Surgical History:  Procedure Laterality Date   CESAREAN SECTION  09/29/2016   CHOLECYSTECTOMY     CYSTOSCOPY W/ URETERAL STENT PLACEMENT Right 07/15/2021   Procedure: CYSTOSCOPY WITH RETROGRADE PYELOGRAM/URETERAL STENT PLACEMENT;  Surgeon: Raynelle Bring, MD;  Location: [facility];  Service: Urology;  Laterality: Right;   CYSTOSCOPY WITH RETROGRADE PYELOGRAM, URETEROSCOPY AND STENT PLACEMENT Right 05/03/2021   Procedure: CYSTOSCOPY WITH RIGHT RETROGRADE PYELOGRAM, URETEROSCOPY;  Surgeon: Raynelle Bring, MD;  Location: [facility];  Service: Urology;  Laterality: Right;   DILATION AND CURETTAGE OF UTERUS N/A 12/31/2015   Procedure: SUCTION DILATATION AND CURETTAGE;  Surgeon: Aletha Halim, MD;  Location: [facility];  Service: Gynecology;  Laterality: N/A;   DILATION AND EVACUATION N/A 01/01/2016   Procedure: DILATATION AND EVACUATION;   Surgeon: Jonnie Kind, MD;  Location: [facility];  Service: Gynecology;  Laterality: N/A;   ESOPHAGOGASTRODUODENOSCOPY     GASTRIC BYPASS  06/02/2020   IR NEPHRO TUBE REMOV/FL  07/16/2021   IR NEPHROSTOMY EXCHANGE RIGHT  05/12/2021   IR NEPHROSTOMY PLACEMENT RIGHT  05/05/2021   ROBOT ASSISTED PYELOPLASTY Right 07/15/2021   Procedure: XI ROBOTIC ASSISTED  LAPAROSCOPIC PYELOPLASTY;  Surgeon: Raynelle Bring, MD;  Location: [facility];  Service: Urology;  Laterality: Right;   ROBOTIC ASSITED PARTIAL NEPHRECTOMY Right 03/15/2021   Procedure: XI ROBOTIC ASSITED PARTIAL NEPHRECTOMY;  Surgeon: Raynelle Bring, MD;  Location: [facility];  Service: Urology;  Laterality: Right;   TONSILLECTOMY     TUBAL LIGATION      Family History  Problem Relation Age of Onset   Hypertension Mother    Thyroid disease Mother    Cervical cancer Mother        dx 34s   Stomach cancer Maternal Grandmother        dx 59s   Leukemia Maternal Grandfather        d. 80s   Depression Paternal Grandmother    Cancer Paternal Grandfather        dx after 66; mets   Stomach cancer Other        MGM's brother; dx after 26   Cancer Other        MGF's sisters x3; unknown primary  Breast cancer Maternal Great-grandmother        dx <50; mets; MGF's mother    Social History   Socioeconomic History   Marital status: Married    Spouse name: Not on file   Number of children: 9   Years of education: 13   Highest education level: Associate degree: academic program  Occupational History   Occupation: Physiological scientist  Tobacco Use   Smoking status: Never   Smokeless tobacco: Never  Scientific laboratory technician Use: Never used  Substance and Sexual Activity   Alcohol use: Yes    Comment: OCCASIONAL   Drug use: No   Sexual activity: Yes    Partners: Male    Birth control/protection: Surgical    Comment: tubal  Other Topics Concern   Not on file  Social History Narrative   Not on file   Social Determinants of Health    Financial Resource Strain: Not on file  Food Insecurity: Not on file  Transportation Needs: Not on file  Physical Activity: Not on file  Stress: Not on file  Social Connections: Not on file  Intimate Partner Violence: Not on file    Outpatient Medications Prior to Visit  Medication Sig Dispense Refill   albuterol (VENTOLIN HFA) 108 (90 Base) MCG/ACT inhaler Inhale 2 puffs into the lungs every 6 (six) hours as needed for wheezing or shortness of breath. 1 each 5   budesonide-formoterol (SYMBICORT) 80-4.5 MCG/ACT inhaler Inhale 2 puffs into the lungs daily as needed (wheezing).     cyanocobalamin (VITAMIN B12) 1000 MCG/ML injection Inject 1 ML as directed once a month 1 mL 2   cyclobenzaprine (FLEXERIL) 10 MG tablet Take 1 tablet (10 mg total) by mouth 3 (three) times daily as needed for muscle spasms. 30 tablet 0   dicyclomine (BENTYL) 20 MG tablet Take 1 tablet (20 mg total) by mouth 3 (three) times daily before meals. 30 tablet 1   docusate sodium (COLACE) 100 MG capsule Take 1 capsule (100 mg total) by mouth 2 (two) times daily. (Patient taking differently: Take 100 mg by mouth daily.)     lisdexamfetamine (VYVANSE) 40 MG capsule Take 1 capsule (40 mg total) by mouth every morning. 30 capsule 0   LORazepam (ATIVAN) 1 MG tablet Take 1 tablet (1 mg total) by mouth daily as needed. 30 tablet 1   Norethindrone Acetate-Ethinyl Estrad-FE (JUNEL FE 24) 1-20 MG-MCG(24) tablet Take 1 tablet by mouth daily. 84 tablet 2   omeprazole (PRILOSEC) 20 MG capsule TAKE 1 CAPSULE TWICE DAILY 180 capsule 1   ondansetron (ZOFRAN-ODT) 4 MG disintegrating tablet Take 1 tablet (4 mg total) by mouth every 8 (eight) hours as needed for nausea or vomiting. 90 tablet 1   Semaglutide,0.25 or 0.5MG/DOS, (OZEMPIC, 0.25 OR 0.5 MG/DOSE,) 2 MG/3ML SOPN Inject 0.5 mg into the skin once a week. 3 mL 3   Vitamin D, Ergocalciferol, (DRISDOL) 1.25 MG (50000 UNIT) CAPS capsule Take 1 capsule by mouth 3 times weekly 36 capsule  1   No facility-administered medications prior to visit.    No Known Allergies  Review of Systems CONSTITUTIONAL: see HPI E/N/T: Negative for ear pain, nasal congestion and sore throat.  CARDIOVASCULAR: Negative for chest pain,  RESPIRATORY: Negative for recent cough and dyspnea.  GASTROINTESTINAL: mild nausea - no vomiting - no abdominal pain GU - negative INTEGUMENTARY: Negative for rash.         Objective:   PHYSICAL EXAM:   VS: BP 120/74  Pulse 90   Temp 99.5 F (37.5 C)   Resp 14   Ht _0  (1.6 m)   Wt 170 lb (77.1 kg)   SpO2 100%   BMI 30.11 kg/m   GEN: Well nourished, well developed, in no acute distress  HEENT: normal external ears and nose - normal external auditory canals and TMS - Oropharynx - normal mucosa, palate, and posterior pharynx Cardiac: RRR;  Respiratory:  normal respiratory rate and pattern with no distress - normal breath sounds with no rales, rhonchi, wheezes or rubs GI: normal bowel sounds, no masses or tenderness  Office Visit on 03/02/2022  Component Date Value Ref Range Status   Color, UA 03/02/2022 yellow  yellow Final   Clarity, UA 03/02/2022 clear  clear Final   Glucose, UA 03/02/2022 negative  negative mg/dL Final   Bilirubin, UA 03/02/2022 small (A)  negative Final   Ketones, POC UA 03/02/2022 trace (5) (A)  negative mg/dL Final   Spec Grav, UA 03/02/2022 >=1.030 (A)  1.010 - 1.025 Final   Blood, UA 03/02/2022 negative  negative Final   pH, UA 03/02/2022 6.0  5.0 - 8.0 Final   POC PROTEIN,UA 03/02/2022 trace  negative, trace Final   Urobilinogen, UA 03/02/2022 1.0  0.2 or 1.0 E.U./dL Final   Nitrite, UA 03/02/2022 Negative  Negative Final   Leukocytes, UA 03/02/2022 Trace (A)  Negative Final   SARS Coronavirus 2 Ag 03/02/2022 Negative  Negative Final   Influenza A, POC 03/02/2022 Negative  Negative Final   Influenza B, POC 03/02/2022 Negative  Negative Final     Health Maintenance Due  Topic Date Due   OPHTHALMOLOGY EXAM   07/09/2021    There are no preventive care reminders to display for this patient.   Lab Results  Component Value Date   TSH 1.260 09/08/2021   Lab Results  Component Value Date   WBC 9.5 02/18/2022   HGB 13.1 02/18/2022   HCT 39.4 02/18/2022   MCV 88 02/18/2022   PLT 270 02/18/2022   Lab Results  Component Value Date   NA 141 02/18/2022   K 4.0 02/18/2022   CO2 21 02/18/2022   GLUCOSE 93 02/18/2022   BUN 15 02/18/2022   CREATININE 0.70 02/18/2022   BILITOT 0.7 02/18/2022   ALKPHOS 75 02/18/2022   AST 17 02/18/2022   ALT 20 02/18/2022   PROT 6.7 02/18/2022   ALBUMIN 4.8 02/18/2022   CALCIUM 9.6 02/18/2022   ANIONGAP 4 (L) 07/17/2021   EGFR 117 02/18/2022   Lab Results  Component Value Date   CHOL 189 11/30/2021   Lab Results  Component Value Date   HDL 70 11/30/2021   Lab Results  Component Value Date   LDLCALC 103 (H) 11/30/2021   Lab Results  Component Value Date   TRIG 88 11/30/2021   Lab Results  Component Value Date   CHOLHDL 2.7 11/30/2021   Lab Results  Component Value Date   HGBA1C 5.0 11/30/2021       Assessment & Plan:   Problem List Items Addressed This Visit       Other   Malaise - Primary   Relevant Orders   POCT URINALYSIS DIP (CLINITEK) (Completed)   POC COVID-19 BinaxNow (Completed)   POCT Influenza A/B (Completed)   Viral disease Rest, fluids, tylenol [place] diet Follow up if symptoms change or worsen   No orders of the defined types were placed in this encounter.   Orders Placed This Encounter  Procedures  POCT URINALYSIS DIP (CLINITEK)   POC COVID-19 BinaxNow   POCT Influenza A/B     Follow-up: Return if symptoms worsen or fail to improve.  An After Visit Summary was printed and given to the patient.  Yetta Flock [facility] [phone]

## 2022-03-03 ENCOUNTER — Encounter: Payer: Self-pay | Admitting: Genetic Counselor

## 2022-03-03 ENCOUNTER — Telehealth: Payer: Self-pay | Admitting: Genetic Counselor

## 2022-03-03 DIAGNOSIS — Z1509 Genetic susceptibility to other malignant neoplasm: Secondary | ICD-10-CM | POA: Insufficient documentation

## 2022-03-03 DIAGNOSIS — Z1379 Encounter for other screening for genetic and chromosomal anomalies: Secondary | ICD-10-CM

## 2022-03-03 HISTORY — DX: Genetic susceptibility to other malignant neoplasm: Z15.09

## 2022-03-03 HISTORY — DX: Encounter for other screening for genetic and chromosomal anomalies: Z13.79

## 2022-03-03 NOTE — Telephone Encounter (Signed)
Revealed POT1 mutation.  Discussed increased risk for melanoma and possibly other cancers.  Briefly reviewed management recommendations.  Follow up appt with genetics scheduled at [facility] on 2/16 at 4pm.  Offered to be seen sooner at [facility] or virtually but patient preferred [facility] appt.  Requested patient gather more detailed information about FH cancer so that appropriate management recommendations can be made based on FH.

## 2022-03-04 ENCOUNTER — Other Ambulatory Visit (HOSPITAL_COMMUNITY): Payer: Self-pay

## 2022-03-05 DIAGNOSIS — K529 Noninfective gastroenteritis and colitis, unspecified: Secondary | ICD-10-CM | POA: Diagnosis not present

## 2022-03-05 DIAGNOSIS — Z79899 Other long term (current) drug therapy: Secondary | ICD-10-CM | POA: Diagnosis not present

## 2022-03-05 DIAGNOSIS — N3001 Acute cystitis with hematuria: Secondary | ICD-10-CM | POA: Diagnosis not present

## 2022-03-05 DIAGNOSIS — Z9049 Acquired absence of other specified parts of digestive tract: Secondary | ICD-10-CM | POA: Diagnosis not present

## 2022-03-05 DIAGNOSIS — R509 Fever, unspecified: Secondary | ICD-10-CM | POA: Diagnosis not present

## 2022-03-05 DIAGNOSIS — R197 Diarrhea, unspecified: Secondary | ICD-10-CM | POA: Diagnosis not present

## 2022-03-05 DIAGNOSIS — K2970 Gastritis, unspecified, without bleeding: Secondary | ICD-10-CM | POA: Diagnosis not present

## 2022-03-05 DIAGNOSIS — Z9889 Other specified postprocedural states: Secondary | ICD-10-CM | POA: Diagnosis not present

## 2022-03-15 ENCOUNTER — Emergency Department (HOSPITAL_BASED_OUTPATIENT_CLINIC_OR_DEPARTMENT_OTHER)
Admission: EM | Admit: 2022-03-15 | Discharge: 2022-03-15 | Disposition: A | Discharge status: Home / Self Care | Payer: 59 | Attending: Emergency Medicine | Admitting: Emergency Medicine

## 2022-03-15 ENCOUNTER — Encounter (HOSPITAL_BASED_OUTPATIENT_CLINIC_OR_DEPARTMENT_OTHER): Payer: Self-pay

## 2022-03-15 ENCOUNTER — Other Ambulatory Visit: Payer: Self-pay

## 2022-03-15 ENCOUNTER — Emergency Department (HOSPITAL_BASED_OUTPATIENT_CLINIC_OR_DEPARTMENT_OTHER): Payer: 59

## 2022-03-15 DIAGNOSIS — Z794 Long term (current) use of insulin: Secondary | ICD-10-CM | POA: Insufficient documentation

## 2022-03-15 DIAGNOSIS — E119 Type 2 diabetes mellitus without complications: Secondary | ICD-10-CM | POA: Diagnosis not present

## 2022-03-15 DIAGNOSIS — I10 Essential (primary) hypertension: Secondary | ICD-10-CM | POA: Diagnosis not present

## 2022-03-15 DIAGNOSIS — R1031 Right lower quadrant pain: Secondary | ICD-10-CM | POA: Insufficient documentation

## 2022-03-15 DIAGNOSIS — Z79899 Other long term (current) drug therapy: Secondary | ICD-10-CM | POA: Insufficient documentation

## 2022-03-15 DIAGNOSIS — R109 Unspecified abdominal pain: Secondary | ICD-10-CM

## 2022-03-15 LAB — URINALYSIS, ROUTINE W REFLEX MICROSCOPIC
Bilirubin Urine: NEGATIVE
Glucose, UA: NEGATIVE mg/dL
Hgb urine dipstick: NEGATIVE
Ketones, ur: NEGATIVE mg/dL
Leukocytes,Ua: NEGATIVE
Nitrite: NEGATIVE
Protein, ur: NEGATIVE mg/dL
Specific Gravity, Urine: >=1.03 (ref 1.005–1.030)
pH: 5.5 (ref 5.0–8.0)

## 2022-03-15 LAB — COMPREHENSIVE METABOLIC PANEL
ALT: 15 U/L (ref 0–44)
AST: 14 U/L — ABNORMAL LOW (ref 15–41)
Albumin: 4 g/dL (ref 3.5–5.0)
Alkaline Phosphatase: 41 U/L (ref 38–126)
Anion gap: 8 (ref 5–15)
BUN: 15 mg/dL (ref 6–20)
CO2: 24 mmol/L (ref 22–32)
Calcium: 8.6 mg/dL — ABNORMAL LOW (ref 8.9–10.3)
Chloride: 104 mmol/L (ref 98–111)
Creatinine, Ser: 0.58 mg/dL (ref 0.44–1.00)
GFR, Estimated: >60 mL/min (ref >60)
Glucose, Bld: 86 mg/dL (ref 70–99)
Potassium: 4 mmol/L (ref 3.5–5.1)
Sodium: 136 mmol/L (ref 135–145)
Total Bilirubin: 0.6 mg/dL (ref 0.3–1.2)
Total Protein: 6.9 g/dL (ref 6.5–8.1)

## 2022-03-15 LAB — CBC
HCT: 36.2 % (ref 36.0–46.0)
Hemoglobin: 11.9 g/dL — ABNORMAL LOW (ref 12.0–15.0)
MCH: 29.2 pg (ref 26.0–34.0)
MCHC: 32.9 g/dL (ref 30.0–36.0)
MCV: 88.9 fL (ref 80.0–100.0)
Platelets: 266 10*3/uL (ref 150–400)
RBC: 4.07 MIL/uL (ref 3.87–5.11)
RDW: 14.1 % (ref 11.5–15.5)
WBC: 10.3 10*3/uL (ref 4.0–10.5)
nRBC: 0 % (ref 0.0–0.2)

## 2022-03-15 LAB — PREGNANCY, URINE: Preg Test, Ur: NEGATIVE

## 2022-03-15 LAB — LIPASE, BLOOD: Lipase: 26 U/L (ref 11–51)

## 2022-03-15 MED ORDER — ONDANSETRON HCL 4 MG/2ML IJ SOLN
4.0000 mg | Freq: Once | INTRAMUSCULAR | Status: AC
  Administered 2022-03-15: 4 mg via INTRAVENOUS
  Filled 2022-03-15: qty 2

## 2022-03-15 MED ORDER — OXYCODONE-ACETAMINOPHEN 5-325 MG PO TABS: 1.0000 | ORAL_TABLET | Freq: Four times a day (QID) | ORAL | 0 refills | Status: DC | PRN

## 2022-03-15 MED ORDER — IOHEXOL 300 MG/ML  SOLN
100.0000 mL | Freq: Once | INTRAMUSCULAR | Status: AC | PRN
  Administered 2022-03-15: 100 mL via INTRAVENOUS

## 2022-03-15 MED ORDER — HYDROCODONE-ACETAMINOPHEN 5-325 MG PO TABS: 1.0000 | ORAL_TABLET | Freq: Once | ORAL | Status: DC

## 2022-03-15 MED ORDER — MORPHINE SULFATE (PF) 4 MG/ML IV SOLN
4.0000 mg | Freq: Once | INTRAVENOUS | Status: AC
  Administered 2022-03-15: 4 mg via INTRAVENOUS
  Filled 2022-03-15: qty 1

## 2022-03-15 MED ORDER — NAPROXEN 500 MG PO TABS: 500.0000 mg | ORAL_TABLET | Freq: Two times a day (BID) | ORAL | 0 refills | Status: DC

## 2022-03-15 NOTE — ED Provider Notes (Signed)
[facility] EMERGENCY DEPARTMENT Provider Note   CSN: [id number] Arrival date & time: 03/15/22  1739     History  Chief Complaint  Patient presents with  . Abdominal Pain   HPI Michelle Lamb is a 32 y.o. female with history of gastric bypass in 2022, benign renal cyst removal in 2023, diabetes, hyperlipidemia and hypertension presenting for abdominal pain.  Started all of a sudden at 11:30 AM this morning.  Pain is located "below the bellybutton".  It is tender to touch.  Worse with walking.  Endorses nausea but no vomiting or diarrhea.  States she did have the "stomach flu" and was seen for this on Saturday at [facility].  CT scan at that time of her abdomen pelvis was unremarkable.  Evaluation did reveal that she did have a UTI.  States she finished up her antibiotic yesterday.  Denies fever or chills.  States she has not had a regular bowel movement in a week since her recent GI viral illness.   Abdominal Pain      Home Medications Prior to Admission medications   Medication Sig Start Date End Date Taking? Authorizing Provider  albuterol (VENTOLIN HFA) 108 (90 Base) MCG/ACT inhaler Inhale 2 puffs into the lungs every 6 (six) hours as needed for wheezing or shortness of breath. 09/08/21   [name], PA-C  budesonide-formoterol [facility]) 80-4.5 MCG/ACT inhaler Inhale 2 puffs into the lungs daily as needed (wheezing). 06/05/19   [provider]  cyanocobalamin (VITAMIN B12) 1000 MCG/ML injection Inject 1 ML as directed once a month 12/17/21   [name], PA-C  cyclobenzaprine (FLEXERIL) 10 MG tablet Take 1 tablet (10 mg total) by mouth 3 (three) times daily as needed for muscle spasms. 11/04/21   [name], PA-C  dicyclomine (BENTYL) 20 MG tablet Take 1 tablet (20 mg total) by mouth 3 (three) times daily before meals. 12/02/21   [name], PA-C  docusate sodium (COLACE) 100 MG capsule Take 1 capsule (100 mg total) by mouth 2 (two) times daily. Patient  taking differently: Take 100 mg by mouth daily. 03/15/21   Debbrah Alar, PA-C  lisdexamfetamine (VYVANSE) 40 MG capsule Take 1 capsule (40 mg total) by mouth every morning. 02/23/22   Rip Harbour, NP  LORazepam (ATIVAN) 1 MG tablet Take 1 tablet (1 mg total) by mouth daily as needed. 02/18/22   [name], PA-C  Norethindrone Acetate-Ethinyl Estrad-FE (JUNEL FE 24) 1-20 MG-MCG(24) tablet Take 1 tablet by mouth daily. 01/06/22   Chrzanowski, Wende Crease B, NP  omeprazole (PRILOSEC) 20 MG capsule TAKE 1 CAPSULE TWICE DAILY 10/19/21   Rip Harbour, NP  ondansetron (ZOFRAN-ODT) 4 MG disintegrating tablet Take 1 tablet (4 mg total) by mouth every 8 (eight) hours as needed for nausea or vomiting. 12/17/21   [name], PA-C  Semaglutide,0.25 or 0.'5MG'$ /DOS, (OZEMPIC, 0.25 OR 0.5 MG/DOSE,) 2 MG/3ML SOPN Inject 0.5 mg into the skin once a week. 01/07/22   [name], PA-C  Vitamin D, Ergocalciferol, (DRISDOL) 1.25 MG (50000 UNIT) CAPS capsule Take 1 capsule by mouth 3 times weekly 11/15/21   [name], PA-C      Allergies    Patient has no known allergies.    Review of Systems   Review of Systems  Gastrointestinal:  Positive for abdominal pain.    Physical Exam   Vitals:   03/15/22 1745  BP: (!) 147/90  Pulse: 63  Resp: 20  Temp: 98.3 F (36.8 C)  SpO2: 100%  CONSTITUTIONAL:  well-appearing, NAD NEURO:  Alert and oriented x 3, CN 3-12 grossly intact EYES:  eyes equal and reactive ENT/NECK:  Supple, no stridor  CARDIO:  regular rate and rhythm, appears well-perfused  PULM:  No respiratory distress, CTAB GI/GU:  non-distended, soft, generalized abdominal tenderness MSK/SPINE:  No gross deformities, no edema, moves all extremities  SKIN:  no rash, atraumatic   *Additional and/or pertinent findings included in MDM below   ED Results / Procedures / Treatments   Labs (all labs ordered are listed, but only abnormal results are displayed) Labs Reviewed  COMPREHENSIVE METABOLIC  PANEL - Abnormal; Notable for the following components:      Result Value   Calcium 8.6 (*)    AST 14 (*)    All other components within normal limits  CBC - Abnormal; Notable for the following components:   Hemoglobin 11.9 (*)    All other components within normal limits  URINALYSIS, ROUTINE W REFLEX MICROSCOPIC - Abnormal; Notable for the following components:   APPearance HAZY (*)    All other components within normal limits  LIPASE, BLOOD  PREGNANCY, URINE    EKG None  Radiology No results found.  Procedures Procedures    Medications Ordered in ED Medications  ondansetron (ZOFRAN) injection 4 mg (4 mg Intravenous Given 03/15/22 1821)  morphine (PF) 4 MG/ML injection 4 mg (4 mg Intravenous Given 03/15/22 1821)    ED Course/ Medical Decision Making/ A&P Clinical Course as of 03/15/22 1854  Tue Mar 15, 2022  1853 CT Abdomen Pelvis W Contrast [JR]    Clinical Course User Index [JR] Harriet Pho, PA-C                             Medical Decision Making Amount and/or Complexity of Data Reviewed Labs: ordered.   32 year old female who is well-appearing, nontoxic and hemodynamically stable presenting for abdominal pain.  Physical exam notable for generalized abdominal pain but otherwise reassuring.  Differential diagnosis for this complaint includes cholecystitis, bowel obstruction, appendicitis, pancreatitis, diverticulitis, colitis, and nephrolithiasis and UTI.  Given her general tenderness and multiple intra-abdominal surgeries did feel it warranted further evaluation with CT scan.  Treated nausea with Zofran.  Treated pain with morphine.  Signed out patient to PA Rex Kras who will continue to evaluate and manage.         Final Clinical Impression(s) / ED Diagnoses Final diagnoses:  Abdominal pain, unspecified abdominal location    Rx / DC Orders ED Discharge Orders     None         Harriet Pho, PA-C 03/15/22 1845    Sherwood Gambler,  MD 03/15/22 2242

## 2022-03-15 NOTE — ED Provider Notes (Signed)
Patient care taken over at shift handoff from outgoing provider. In short, patient with a history of gastric bypass in 2022 is here with sudden onset of abdominal pain at 1130 this morning.  Pain is located in the suprapubic area and right lower quadrant.  She has recently finished her antibiotics for UTI.   Physical Exam  BP (!) 147/90 (BP Location: Left Arm)   Pulse 63   Temp 98.3 F (36.8 C) (Oral)   Resp 20   Ht '5\' 3"'$  (1.6 m)   Wt 69.4 kg   LMP 03/02/2022 (Approximate)   SpO2 100%   BMI 27.10 kg/m   Physical Exam Vitals and nursing note reviewed.  Constitutional:      Appearance: Normal appearance.  HENT:     Head: Normocephalic and atraumatic.     Mouth/Throat:     Mouth: Mucous membranes are moist.  Eyes:     General: No scleral icterus. Cardiovascular:     Rate and Rhythm: Normal rate and regular rhythm.     Pulses: Normal pulses.     Heart sounds: Normal heart sounds.  Pulmonary:     Effort: Pulmonary effort is normal.     Breath sounds: Normal breath sounds.  Abdominal:     General: Abdomen is flat.     Palpations: Abdomen is soft.     Tenderness: There is abdominal tenderness in the right lower quadrant and suprapubic area.  Musculoskeletal:        General: No deformity.  Skin:    General: Skin is warm.     Findings: No rash.  Neurological:     General: No focal deficit present.     Mental Status: She is alert.  Psychiatric:        Mood and Affect: Mood normal.     Procedures  Procedures  ED Course / MDM   Clinical Course as of 03/15/22 1855  Tue Mar 15, 2022  1853 CT Abdomen Pelvis W Contrast [JR]    Clinical Course User Index [JR] Harriet Pho, PA-C   Medical Decision Making Amount and/or Complexity of Data Reviewed Labs: ordered. Radiology: ordered. Decision-making details documented in ED Course.  Risk Prescription drug management.   On physical examination, patient is afebrile and appears in no acute distress.  There was  tenderness to palpation to the right lower quadrant and suprapubic area.  CT scan showed no acute abnormalities.  Patient states her pain has improved with morphine and Zofran has helped with her nausea.  I sent an Rx of Percocet for pain as needed.  Advised patient to follow-up with GI for further evaluation and management.  Return to the ER if new or worsening symptoms.   Disposition Continued outpatient therapy. Follow-up with GI recommended for reevaluation of symptoms. Treatment plan discussed with patient.  Pt acknowledged understanding was agreeable to the plan. Worrisome signs and symptoms were discussed with patient, and patient acknowledged understanding to return to the ED if they noticed these signs and symptoms. Patient was stable upon discharge.   This chart was dictated using voice recognition software.  Despite best efforts to proofread,  errors can occur which can change the documentation meaning.         Rex Kras, PA 03/15/22 2308    Sherwood Gambler, MD 03/16/22 [phone]

## 2022-03-15 NOTE — Discharge Instructions (Addendum)
Please take tylenol/naproxen or Percocet as needed for pain. I recommend close follow-up with GI for reevaluation.  Please do not hesitate to return to emergency department if worrisome signs symptoms we discussed become apparent.

## 2022-03-15 NOTE — ED Notes (Signed)
Pt stable at time of discharge. RR even and unlabored. No distress noted. Pt verbalized understanding of discharge instructions as discussed. Pt ambulatory at time of discharge.

## 2022-03-15 NOTE — ED Triage Notes (Signed)
Pt reports lower central abd pain that radiates to her back, reports it is tender to palpation; associated with nausea and diarrhea; onset today at 1130am. Hx of gastric bypass about 6 months ago.

## 2022-03-16 ENCOUNTER — Ambulatory Visit: Payer: 59 | Admitting: Nurse Practitioner

## 2022-03-21 ENCOUNTER — Encounter: Payer: Self-pay | Admitting: Nurse Practitioner

## 2022-03-21 ENCOUNTER — Ambulatory Visit (INDEPENDENT_AMBULATORY_CARE_PROVIDER_SITE_OTHER): Payer: 59 | Admitting: Nurse Practitioner

## 2022-03-21 VITALS — BP 124/74 | HR 79 | Temp 97.6°F | Ht 63.0 in | Wt 153.0 lb

## 2022-03-21 DIAGNOSIS — D1771 Benign lipomatous neoplasm of kidney: Secondary | ICD-10-CM

## 2022-03-21 DIAGNOSIS — J069 Acute upper respiratory infection, unspecified: Secondary | ICD-10-CM | POA: Diagnosis not present

## 2022-03-21 DIAGNOSIS — Z905 Acquired absence of kidney: Secondary | ICD-10-CM | POA: Insufficient documentation

## 2022-03-21 HISTORY — DX: Benign lipomatous neoplasm of kidney: D17.71

## 2022-03-21 HISTORY — DX: Acquired absence of kidney: Z90.5

## 2022-03-21 MED ORDER — PROMETHAZINE-DM 6.25-15 MG/5ML PO SYRP: 5.0000 mL | ORAL_SOLUTION | Freq: Four times a day (QID) | ORAL | 0 refills | Status: DC | PRN

## 2022-03-21 MED ORDER — FLUTICASONE PROPIONATE 50 MCG/ACT NA SUSP: 2.0000 | Freq: Every day | NASAL | 6 refills | Status: AC

## 2022-03-21 NOTE — Progress Notes (Signed)
Acute Office Visit  Subjective:    Patient ID: Michelle Lamb, female    DOB: 1991/02/17, 32 y.o.   MRN: [id number]  CC: URI  HPI: Patient is in today for URI symptoms of sore throat, sinus congestion, chills, and cough. Denies fever, rash, or body aches. Onset of symptoms was three days ago. Treatment has included Tylenol Cold and Flu. COVID-19 and flu tests negative today.   Past Medical History:  Diagnosis Date   ADHD    Anxiety    Asthma    Chronic kidney disease    Complication of anesthesia    Slow to wake up   Depression    Diabetes mellitus without complication ([place])    TYPE 2 LAST DOSE FRIDAY OCTOBER 27 (METFORMIN)   Fatty liver    GERD (gastroesophageal reflux disease)    History of herpes simplex infection    Hyperlipidemia    Hypertension    no meds since gastric   Iron deficiency anemia 09/14/2021   Neoplasm of right kidney    Seasonal allergies    Sleep apnea    Vaginal delivery 2009, 2010, 2015   Vitamin D deficiency     Past Surgical History:  Procedure Laterality Date   CESAREAN SECTION  09/29/2016   CHOLECYSTECTOMY     CYSTOSCOPY W/ URETERAL STENT PLACEMENT Right 07/15/2021   Procedure: CYSTOSCOPY WITH RETROGRADE PYELOGRAM/URETERAL STENT PLACEMENT;  Surgeon: Raynelle Bring, MD;  Location: [facility];  Service: Urology;  Laterality: Right;   CYSTOSCOPY WITH RETROGRADE PYELOGRAM, URETEROSCOPY AND STENT PLACEMENT Right 05/03/2021   Procedure: CYSTOSCOPY WITH RIGHT RETROGRADE PYELOGRAM, URETEROSCOPY;  Surgeon: Raynelle Bring, MD;  Location: [facility];  Service: Urology;  Laterality: Right;   DILATION AND CURETTAGE OF UTERUS N/A 12/31/2015   Procedure: SUCTION DILATATION AND CURETTAGE;  Surgeon: Aletha Halim, MD;  Location: [facility];  Service: Gynecology;  Laterality: N/A;   DILATION AND EVACUATION N/A 01/01/2016   Procedure: DILATATION AND EVACUATION;  Surgeon: Jonnie Kind, MD;  Location: [facility];  Service: Gynecology;  Laterality: N/A;    ESOPHAGOGASTRODUODENOSCOPY     GASTRIC BYPASS  06/02/2020   IR NEPHRO TUBE REMOV/FL  07/16/2021   IR NEPHROSTOMY EXCHANGE RIGHT  05/12/2021   IR NEPHROSTOMY PLACEMENT RIGHT  05/05/2021   ROBOT ASSISTED PYELOPLASTY Right 07/15/2021   Procedure: XI ROBOTIC ASSISTED  LAPAROSCOPIC PYELOPLASTY;  Surgeon: Raynelle Bring, MD;  Location: [facility];  Service: Urology;  Laterality: Right;   ROBOTIC ASSITED PARTIAL NEPHRECTOMY Right 03/15/2021   Procedure: XI ROBOTIC ASSITED PARTIAL NEPHRECTOMY;  Surgeon: Raynelle Bring, MD;  Location: [facility];  Service: Urology;  Laterality: Right;   TONSILLECTOMY     TUBAL LIGATION      Family History  Problem Relation Age of Onset   Hypertension Mother    Thyroid disease Mother    Cervical cancer Mother        dx 58s   Stomach cancer Maternal Grandmother        dx 70s   Leukemia Maternal Grandfather        d. 55s   Depression Paternal Grandmother    Cancer Paternal Grandfather        dx after 82; mets   Stomach cancer Other        MGM's brother; dx after 30   Cancer Other        MGF's sisters x3; unknown primary   Breast cancer Maternal Great-grandmother        dx <50; mets; MGF's mother  Social History   Socioeconomic History   Marital status: Married    Spouse name: Not on file   Number of children: 9   Years of education: 13   Highest education level: Associate degree: academic program  Occupational History   Occupation: Physiological scientist  Tobacco Use   Smoking status: Never   Smokeless tobacco: Never  Vaping Use   Vaping Use: Never used  Substance and Sexual Activity   Alcohol use: Yes    Comment: OCCASIONAL   Drug use: No   Sexual activity: Yes    Partners: Male    Birth control/protection: Surgical    Comment: tubal  Other Topics Concern   Not on file  Social History Narrative   Not on file   Social Determinants of Health   Financial Resource Strain: Not on file  Food Insecurity: Not on file  Transportation Needs:  Not on file  Physical Activity: Not on file  Stress: Not on file  Social Connections: Not on file  Intimate Partner Violence: Not on file    Outpatient Medications Prior to Visit  Medication Sig Dispense Refill   albuterol (VENTOLIN HFA) 108 (90 Base) MCG/ACT inhaler Inhale 2 puffs into the lungs every 6 (six) hours as needed for wheezing or shortness of breath. 1 each 5   budesonide-formoterol (SYMBICORT) 80-4.5 MCG/ACT inhaler Inhale 2 puffs into the lungs daily as needed (wheezing).     cyanocobalamin (VITAMIN B12) 1000 MCG/ML injection Inject 1 ML as directed once a month 1 mL 2   cyclobenzaprine (FLEXERIL) 10 MG tablet Take 1 tablet (10 mg total) by mouth 3 (three) times daily as needed for muscle spasms. 30 tablet 0   dicyclomine (BENTYL) 20 MG tablet Take 1 tablet (20 mg total) by mouth 3 (three) times daily before meals. 30 tablet 1   docusate sodium (COLACE) 100 MG capsule Take 1 capsule (100 mg total) by mouth 2 (two) times daily. (Patient taking differently: Take 100 mg by mouth daily.)     lisdexamfetamine (VYVANSE) 40 MG capsule Take 1 capsule (40 mg total) by mouth every morning. 30 capsule 0   LORazepam (ATIVAN) 1 MG tablet Take 1 tablet (1 mg total) by mouth daily as needed. 30 tablet 1   naproxen (NAPROSYN) 500 MG tablet Take 1 tablet (500 mg total) by mouth 2 (two) times daily. 30 tablet 0   Norethindrone Acetate-Ethinyl Estrad-FE (JUNEL FE 24) 1-20 MG-MCG(24) tablet Take 1 tablet by mouth daily. 84 tablet 2   omeprazole (PRILOSEC) 20 MG capsule TAKE 1 CAPSULE TWICE DAILY 180 capsule 1   ondansetron (ZOFRAN-ODT) 4 MG disintegrating tablet Take 1 tablet (4 mg total) by mouth every 8 (eight) hours as needed for nausea or vomiting. 90 tablet 1   oxyCODONE-acetaminophen (PERCOCET/ROXICET) 5-325 MG tablet Take 1 tablet by mouth every 6 (six) hours as needed for up to 10 doses for severe pain. 6 tablet 0   Semaglutide,0.25 or 0.'5MG'$ /DOS, (OZEMPIC, 0.25 OR 0.5 MG/DOSE,) 2 MG/3ML SOPN  Inject 0.5 mg into the skin once a week. 3 mL 3   Vitamin D, Ergocalciferol, (DRISDOL) 1.25 MG (50000 UNIT) CAPS capsule Take 1 capsule by mouth 3 times weekly 36 capsule 1   No facility-administered medications prior to visit.    No Known Allergies  Review of Systems See pertinent positives and negatives per HPI.     Objective:   BP 124/74   Pulse 79   Temp 97.6 F (36.4 C)   Ht '5\' 3"'$  (  1.6 m)   Wt 153 lb (69.4 kg)   LMP 03/02/2022 (Approximate)   SpO2 99%   BMI 27.10 kg/m   Physical Exam Vitals reviewed.  Constitutional:      Appearance: She is ill-appearing.  HENT:     Right Ear: Tympanic membrane normal.     Left Ear: Tympanic membrane normal.     Nose: Congestion and rhinorrhea present.     Mouth/Throat:     Pharynx: Posterior oropharyngeal erythema present.  Cardiovascular:     Rate and Rhythm: Normal rate and regular rhythm.     Pulses: Normal pulses.     Heart sounds: Normal heart sounds.  Pulmonary:     Effort: Pulmonary effort is normal.     Breath sounds: Normal breath sounds.  Lymphadenopathy:     Cervical: No cervical adenopathy.  Neurological:     Mental Status: She is alert.     LMP 03/02/2022 (Approximate)  Wt Readings from Last 3 Encounters:  03/15/22 153 lb (69.4 kg)  03/02/22 170 lb (77.1 kg)  02/18/22 171 lb 9.6 oz (77.8 kg)    Health Maintenance Due  Topic Date Due   OPHTHALMOLOGY EXAM  07/09/2021       Lab Results  Component Value Date   TSH 1.260 09/08/2021   Lab Results  Component Value Date   WBC 10.3 03/15/2022   HGB 11.9 (L) 03/15/2022   HCT 36.2 03/15/2022   MCV 88.9 03/15/2022   PLT 266 03/15/2022   Lab Results  Component Value Date   NA 136 03/15/2022   K 4.0 03/15/2022   CO2 24 03/15/2022   GLUCOSE 86 03/15/2022   BUN 15 03/15/2022   CREATININE 0.58 03/15/2022   BILITOT 0.6 03/15/2022   ALKPHOS 41 03/15/2022   AST 14 (L) 03/15/2022   ALT 15 03/15/2022   PROT 6.9 03/15/2022   ALBUMIN 4.0 03/15/2022    CALCIUM 8.6 (L) 03/15/2022   ANIONGAP 8 03/15/2022   EGFR 117 02/18/2022   Lab Results  Component Value Date   CHOL 189 11/30/2021   Lab Results  Component Value Date   HDL 70 11/30/2021   Lab Results  Component Value Date   LDLCALC 103 (H) 11/30/2021   Lab Results  Component Value Date   TRIG 88 11/30/2021   Lab Results  Component Value Date   CHOLHDL 2.7 11/30/2021   Lab Results  Component Value Date   HGBA1C 5.0 11/30/2021       Assessment & Plan:    1. Viral upper respiratory tract infection - promethazine-dextromethorphan (PROMETHAZINE-DM) 6.25-15 MG/5ML syrup; Take 5 mLs by mouth 4 (four) times daily as needed.  Dispense: 118 mL; Refill: 0 - fluticasone (FLONASE) 50 MCG/ACT nasal spray; Place 2 sprays into both nostrils daily.  Dispense: 16 g; Refill: 6    Rest and push fluids Continue Tylenol Cold and Flu Flonase daily Take Promethazine-DM as needed up to 4 times daily  Follow-up: PRN  An After Visit Summary was printed and given to the patient.  I, Rip Harbour, NP, have reviewed all documentation for this visit. The documentation on 03/21/22 for the exam, diagnosis, procedures, and orders are all accurate and complete.   Rip Harbour, NP [place] [phone]

## 2022-03-28 ENCOUNTER — Other Ambulatory Visit: Payer: Self-pay

## 2022-03-28 ENCOUNTER — Other Ambulatory Visit (HOSPITAL_COMMUNITY): Payer: Self-pay

## 2022-03-28 DIAGNOSIS — F418 Other specified anxiety disorders: Secondary | ICD-10-CM

## 2022-03-28 MED ORDER — OZEMPIC (0.25 OR 0.5 MG/DOSE) 2 MG/3ML ~~LOC~~ SOPN
0.5000 mg | PEN_INJECTOR | SUBCUTANEOUS | 3 refills | Status: DC
  Filled 2022-03-28 – 2022-03-30 (×2): qty 3, 28d supply, fill #0

## 2022-03-28 MED ORDER — LORAZEPAM 1 MG PO TABS
1.0000 mg | ORAL_TABLET | Freq: Every day | ORAL | 1 refills | Status: DC | PRN
  Filled 2022-03-28 – 2022-03-30 (×2): qty 30, 30d supply, fill #0

## 2022-03-28 MED ORDER — LISDEXAMFETAMINE DIMESYLATE 40 MG PO CAPS
40.0000 mg | ORAL_CAPSULE | ORAL | 0 refills | Status: DC
  Filled 2022-03-28 – 2022-03-30 (×2): qty 30, 30d supply, fill #0

## 2022-03-30 ENCOUNTER — Other Ambulatory Visit (HOSPITAL_COMMUNITY): Payer: Self-pay

## 2022-03-30 ENCOUNTER — Other Ambulatory Visit: Payer: Self-pay

## 2022-04-05 ENCOUNTER — Ambulatory Visit (INDEPENDENT_AMBULATORY_CARE_PROVIDER_SITE_OTHER): Payer: 59 | Admitting: Physician Assistant

## 2022-04-05 ENCOUNTER — Encounter: Payer: Self-pay | Admitting: Physician Assistant

## 2022-04-05 VITALS — BP 134/70 | HR 92 | Temp 98.7°F | Resp 18 | Ht 63.0 in | Wt 171.0 lb

## 2022-04-05 DIAGNOSIS — K760 Fatty (change of) liver, not elsewhere classified: Secondary | ICD-10-CM | POA: Diagnosis not present

## 2022-04-05 DIAGNOSIS — E782 Mixed hyperlipidemia: Secondary | ICD-10-CM | POA: Diagnosis not present

## 2022-04-05 DIAGNOSIS — E559 Vitamin D deficiency, unspecified: Secondary | ICD-10-CM

## 2022-04-05 DIAGNOSIS — R5381 Other malaise: Secondary | ICD-10-CM | POA: Diagnosis not present

## 2022-04-05 DIAGNOSIS — F419 Anxiety disorder, unspecified: Secondary | ICD-10-CM | POA: Diagnosis not present

## 2022-04-05 DIAGNOSIS — K219 Gastro-esophageal reflux disease without esophagitis: Secondary | ICD-10-CM

## 2022-04-05 DIAGNOSIS — N23 Unspecified renal colic: Secondary | ICD-10-CM

## 2022-04-05 DIAGNOSIS — D508 Other iron deficiency anemias: Secondary | ICD-10-CM | POA: Diagnosis not present

## 2022-04-05 DIAGNOSIS — D513 Other dietary vitamin B12 deficiency anemia: Secondary | ICD-10-CM

## 2022-04-05 DIAGNOSIS — F900 Attention-deficit hyperactivity disorder, predominantly inattentive type: Secondary | ICD-10-CM

## 2022-04-05 DIAGNOSIS — R0789 Other chest pain: Secondary | ICD-10-CM

## 2022-04-05 MED ORDER — CYANOCOBALAMIN 1000 MCG/ML IJ SOLN: INTRAMUSCULAR | 2 refills | Status: DC

## 2022-04-05 MED ORDER — VITAMIN D (ERGOCALCIFEROL) 1.25 MG (50000 UNIT) PO CAPS: 50000.0000 [IU] | ORAL_CAPSULE | ORAL | 1 refills | Status: DC

## 2022-04-05 NOTE — Progress Notes (Unsigned)
Established Patient Office Visit  Subjective:  Patient ID: Michelle Lamb, female    DOB: 1990-11-06  Age: 32 y.o. MRN: [id number]  CC:  Chief Complaint  Patient presents with   Anxiety   Depression   Diabetes    HPI Michelle Lamb presents for chronic fasting follow up  Past Medical History:  Diagnosis Date   ADHD    Anxiety    Asthma    Chronic kidney disease    Complication of anesthesia    Slow to wake up   Depression    Diabetes mellitus without complication ([place])    TYPE 2 LAST DOSE FRIDAY OCTOBER 27 (METFORMIN)   Fatty liver    GERD (gastroesophageal reflux disease)    History of herpes simplex infection    Hyperlipidemia    Hypertension    no meds since gastric   Iron deficiency anemia 09/14/2021   Neoplasm of right kidney    Seasonal allergies    Sleep apnea    Vaginal delivery 2009, 2010, 2015   Vitamin D deficiency     Past Surgical History:  Procedure Laterality Date   CESAREAN SECTION  09/29/2016   CHOLECYSTECTOMY     CYSTOSCOPY W/ URETERAL STENT PLACEMENT Right 07/15/2021   Procedure: CYSTOSCOPY WITH RETROGRADE PYELOGRAM/URETERAL STENT PLACEMENT;  Surgeon: Raynelle Bring, MD;  [facility]: [facility];  Service: Urology;  Laterality: Right;   CYSTOSCOPY WITH RETROGRADE PYELOGRAM, URETEROSCOPY AND STENT PLACEMENT Right 05/03/2021   Procedure: CYSTOSCOPY WITH RIGHT RETROGRADE PYELOGRAM, URETEROSCOPY;  Surgeon: Raynelle Bring, MD;  Location: [facility];  Service: Urology;  Laterality: Right;   DILATION AND CURETTAGE OF UTERUS N/A 12/31/2015   Procedure: SUCTION DILATATION AND CURETTAGE;  Surgeon: Aletha Halim, MD;  Location: [facility];  Service: Gynecology;  Laterality: N/A;   DILATION AND EVACUATION N/A 01/01/2016   Procedure: DILATATION AND EVACUATION;  Surgeon: Jonnie Kind, MD;  Location: [facility];  Service: Gynecology;  Laterality: N/A;   ESOPHAGOGASTRODUODENOSCOPY     GASTRIC BYPASS  06/02/2020   IR NEPHRO TUBE REMOV/FL  07/16/2021   IR  NEPHROSTOMY EXCHANGE RIGHT  05/12/2021   IR NEPHROSTOMY PLACEMENT RIGHT  05/05/2021   ROBOT ASSISTED PYELOPLASTY Right 07/15/2021   Procedure: XI ROBOTIC ASSISTED  LAPAROSCOPIC PYELOPLASTY;  Surgeon: Raynelle Bring, MD;  Location: [facility];  Service: Urology;  Laterality: Right;   ROBOTIC ASSITED PARTIAL NEPHRECTOMY Right 03/15/2021   Procedure: XI ROBOTIC ASSITED PARTIAL NEPHRECTOMY;  Surgeon: Raynelle Bring, MD;  Location: [facility];  Service: Urology;  Laterality: Right;   TONSILLECTOMY     TUBAL LIGATION      Family History  Problem Relation Age of Onset   Hypertension Mother    Thyroid disease Mother    Cervical cancer Mother        dx 38s   Stomach cancer Maternal Grandmother        dx 33s   Leukemia Maternal Grandfather        d. 83s   Depression Paternal Grandmother    Cancer Paternal Grandfather        dx after 44; mets   Stomach cancer Other        MGM's brother; dx after 36   Cancer Other        MGF's sisters x3; unknown primary   Breast cancer Maternal Great-grandmother        dx <50; mets; MGF's mother    Social History   Socioeconomic History   Marital status: Married    Spouse  name: Not on file   Number of children: 9   Years of education: 13   Highest education level: Associate degree: academic program  Occupational History   Occupation: Physiological scientist  Tobacco Use   Smoking status: Never   Smokeless tobacco: Never  Vaping Use   Vaping Use: Never used  Substance and Sexual Activity   Alcohol use: Yes    Comment: OCCASIONAL   Drug use: No   Sexual activity: Yes    Partners: Male    Birth control/protection: Surgical    Comment: tubal  Other Topics Concern   Not on file  Social History Narrative   Not on file   Social Determinants of Health   Financial Resource Strain: Not on file  Food Insecurity: Not on file  Transportation Needs: Not on file  Physical Activity: Not on file  Stress: Not on file  Social Connections: Not on file   Intimate Partner Violence: Not on file     Current Outpatient Medications:    albuterol (VENTOLIN HFA) 108 (90 Base) MCG/ACT inhaler, Inhale 2 puffs into the lungs every 6 (six) hours as needed for wheezing or shortness of breath., Disp: 1 each, Rfl: 5   budesonide-formoterol (SYMBICORT) 80-4.5 MCG/ACT inhaler, Inhale 2 puffs into the lungs daily as needed (wheezing)., Disp: , Rfl:    cyclobenzaprine (FLEXERIL) 10 MG tablet, Take 1 tablet (10 mg total) by mouth 3 (three) times daily as needed for muscle spasms., Disp: 30 tablet, Rfl: 0   docusate sodium (COLACE) 100 MG capsule, Take 1 capsule (100 mg total) by mouth 2 (two) times daily. (Patient taking differently: Take 100 mg by mouth daily.), Disp: , Rfl:    fluticasone (FLONASE) 50 MCG/ACT nasal spray, Place 2 sprays into both nostrils daily., Disp: 16 g, Rfl: 6   lisdexamfetamine (VYVANSE) 40 MG capsule, Take 1 capsule (40 mg total) by mouth every morning., Disp: 30 capsule, Rfl: 0   LORazepam (ATIVAN) 1 MG tablet, Take 1 tablet (1 mg total) by mouth daily as needed., Disp: 30 tablet, Rfl: 1   Norethindrone Acetate-Ethinyl Estrad-FE (JUNEL FE 24) 1-20 MG-MCG(24) tablet, Take 1 tablet by mouth daily., Disp: 84 tablet, Rfl: 2   omeprazole (PRILOSEC) 20 MG capsule, TAKE 1 CAPSULE TWICE DAILY, Disp: 180 capsule, Rfl: 1   ondansetron (ZOFRAN-ODT) 4 MG disintegrating tablet, Take 1 tablet (4 mg total) by mouth every 8 (eight) hours as needed for nausea or vomiting., Disp: 90 tablet, Rfl: 1   oxybutynin (DITROPAN) 5 MG tablet, Take 5 mg by mouth daily as needed for bladder spasms., Disp: , Rfl:    Semaglutide,0.25 or 0.'5MG'$ /DOS, (OZEMPIC, 0.25 OR 0.5 MG/DOSE,) 2 MG/3ML SOPN, Inject 0.5 mg into the skin once a week., Disp: 3 mL, Rfl: 3   cyanocobalamin (VITAMIN B12) 1000 MCG/ML injection, Inject 1 ML as directed once a month, Disp: 1 mL, Rfl: 2   [START ON 04/07/2022] Vitamin D, Ergocalciferol, (DRISDOL) 1.25 MG (50000 UNIT) CAPS capsule, Take 1 capsule  (50,000 Units total) by mouth 2 (two) times a week., Disp: 24 capsule, Rfl: 1   No Known Allergies  ROS CONSTITUTIONAL: Negative for chills, fatigue, fever, unintentional weight gain and unintentional weight loss.  E/N/T: Negative for ear pain, nasal congestion and sore throat.  CARDIOVASCULAR: Negative for chest pain, dizziness, palpitations and pedal edema.  RESPIRATORY: Negative for recent cough and dyspnea.  GASTROINTESTINAL: Negative for abdominal pain, acid reflux symptoms, constipation, diarrhea, nausea and vomiting.  MSK: Negative for arthralgias and myalgias.  INTEGUMENTARY: Negative for rash.  NEUROLOGICAL: Negative for dizziness and headaches.  PSYCHIATRIC: Negative for sleep disturbance and to question depression screen.  Negative for depression, negative for anhedonia.        Objective:    PHYSICAL EXAM:   VS: BP 134/70   Pulse 92   Temp 98.7 F (37.1 C)   Resp 18   Ht '5\' 3"'$  (1.6 m)   Wt 171 lb (77.6 kg)   LMP 03/02/2022 (Approximate)   SpO2 97%   BMI 30.29 kg/m   GEN: Well nourished, well developed, in no acute distress  HEENT: normal external ears and nose - normal external auditory canals and TMS - hearing grossly normal - normal nasal mucosa and septum - Lips, Teeth and Gums - normal  Oropharynx - normal mucosa, palate, and posterior pharynx Neck: no JVD or masses - no thyromegaly Cardiac: RRR; no murmurs, rubs, or gallops,no edema - no significant varicosities Respiratory:  normal respiratory rate and pattern with no distress - normal breath sounds with no rales, rhonchi, wheezes or rubs GI: normal bowel sounds, no masses or tenderness MS: no deformity or atrophy  Skin: warm and dry, no rash  Neuro:  Alert and Oriented x 3, Strength and sensation are intact - CN II-Xii grossly intact Psych: euthymic mood, appropriate affect and demeanor  BP 134/70   Pulse 92   Temp 98.7 F (37.1 C)   Resp 18   Ht '5\' 3"'$  (1.6 m)   Wt 171 lb (77.6 kg)   LMP  03/02/2022 (Approximate)   SpO2 97%   BMI 30.29 kg/m  Wt Readings from Last 3 Encounters:  04/05/22 171 lb (77.6 kg)  03/21/22 153 lb (69.4 kg)  03/15/22 153 lb (69.4 kg)     There are no preventive care reminders to display for this patient.  There are no preventive care reminders to display for this patient.  Lab Results  Component Value Date   TSH 1.260 09/08/2021   Lab Results  Component Value Date   WBC 10.3 03/15/2022   HGB 11.9 (L) 03/15/2022   HCT 36.2 03/15/2022   MCV 88.9 03/15/2022   PLT 266 03/15/2022   Lab Results  Component Value Date   NA 136 03/15/2022   K 4.0 03/15/2022   CO2 24 03/15/2022   GLUCOSE 86 03/15/2022   BUN 15 03/15/2022   CREATININE 0.58 03/15/2022   BILITOT 0.6 03/15/2022   ALKPHOS 41 03/15/2022   AST 14 (L) 03/15/2022   ALT 15 03/15/2022   PROT 6.9 03/15/2022   ALBUMIN 4.0 03/15/2022   CALCIUM 8.6 (L) 03/15/2022   ANIONGAP 8 03/15/2022   EGFR 117 02/18/2022   Lab Results  Component Value Date   CHOL 189 11/30/2021   Lab Results  Component Value Date   HDL 70 11/30/2021   Lab Results  Component Value Date   LDLCALC 103 (H) 11/30/2021   Lab Results  Component Value Date   TRIG 88 11/30/2021   Lab Results  Component Value Date   CHOLHDL 2.7 11/30/2021   Lab Results  Component Value Date   HGBA1C 5.0 11/30/2021      Assessment & Plan:   Problem List Items Addressed This Visit       Digestive   Hepatic steatosis   Relevant Orders   Comprehensive metabolic panel   Hemoglobin A1c   Gastroesophageal reflux disease without esophagitis     Other   Mixed hyperlipidemia   Relevant Orders   Comprehensive metabolic  panel   Lipid panel   [place]alaise - Primary   Relevant Orders   CBC with Differential/Platelet   Comprehensive metabolic panel   TSH   Attention deficit hyperactivity disorder (ADHD), predominantly inattentive type   Absolute anemia   Relevant Medications   cyanocobalamin (VITAMIN B12) 1000 MCG/ML  injection   Other Relevant Orders   B12 and Folate Panel   Iron, TIBC and Ferritin Panel   Other Visit Diagnoses     Vitamin D deficiency       Relevant Medications   Vitamin D, Ergocalciferol, (DRISDOL) 1.25 MG (50000 UNIT) CAPS capsule (Start on 04/07/2022)   Other Relevant Orders   VITAMIN D 25 Hydroxy (Vit-D Deficiency, Fractures)   Anxiety       Relevant Orders   TSH   Atypical chest pain       Relevant Orders   EKG 64-PPIR   Ureter colic           Meds ordered this encounter  Medications   cyanocobalamin (VITAMIN B12) 1000 MCG/ML injection    Sig: Inject 1 ML as directed once a month    Dispense:  1 mL    Refill:  2    Order Specific Question:   Supervising Provider    AnswerShelton Silvas   Vitamin D, Ergocalciferol, (DRISDOL) 1.25 MG (50000 UNIT) CAPS capsule    Sig: Take 1 capsule (50,000 Units total) by mouth 2 (two) times a week.    Dispense:  24 capsule    Refill:  1    Order Specific Question:   Supervising Provider    Answer:   Shelton Silvas    Follow-up: Return in about 6 months (around 10/04/2022) for chronic fasting follow up.    SARA R Leah Skora, PA-C

## 2022-04-06 LAB — CBC WITH DIFFERENTIAL/PLATELET
Basophils Absolute: 0.1 10*3/uL (ref 0.0–0.2)
Basos: 1 %
EOS (ABSOLUTE): 0.5 10*3/uL — ABNORMAL HIGH (ref 0.0–0.4)
Eos: 6 %
Hematocrit: 41.5 % (ref 34.0–46.6)
Hemoglobin: 13.6 g/dL (ref 11.1–15.9)
Immature Grans (Abs): 0 10*3/uL (ref 0.0–0.1)
Immature Granulocytes: 0 %
Lymphocytes Absolute: 3 10*3/uL (ref 0.7–3.1)
Lymphs: 36 %
MCH: 28.9 pg (ref 26.6–33.0)
MCHC: 32.8 g/dL (ref 31.5–35.7)
MCV: 88 fL (ref 79–97)
Monocytes Absolute: 0.5 10*3/uL (ref 0.1–0.9)
Monocytes: 5 %
Neutrophils Absolute: 4.3 10*3/uL (ref 1.4–7.0)
Neutrophils: 52 %
Platelets: 280 10*3/uL (ref 150–450)
RBC: 4.7 x10E6/uL (ref 3.77–5.28)
RDW: 13.1 % (ref 11.7–15.4)
WBC: 8.3 10*3/uL (ref 3.4–10.8)

## 2022-04-06 LAB — COMPREHENSIVE METABOLIC PANEL
ALT: 9 IU/L (ref 0–32)
AST: 12 IU/L (ref 0–40)
Albumin/Globulin Ratio: 1.8 (ref 1.2–2.2)
Albumin: 4.4 g/dL (ref 3.9–4.9)
Alkaline Phosphatase: 72 IU/L (ref 44–121)
BUN/Creatinine Ratio: 19 (ref 9–23)
BUN: 12 mg/dL (ref 6–20)
Bilirubin Total: 0.6 mg/dL (ref 0.0–1.2)
CO2: 23 mmol/L (ref 20–29)
Calcium: 9.5 mg/dL (ref 8.7–10.2)
Chloride: 101 mmol/L (ref 96–106)
Creatinine, Ser: 0.64 mg/dL (ref 0.57–1.00)
Globulin, Total: 2.4 g/dL (ref 1.5–4.5)
Glucose: 76 mg/dL (ref 70–99)
Potassium: 4.4 mmol/L (ref 3.5–5.2)
Sodium: 139 mmol/L (ref 134–144)
Total Protein: 6.8 g/dL (ref 6.0–8.5)
eGFR: 120 mL/min/{1.73_m2} (ref >59)

## 2022-04-06 LAB — HEMOGLOBIN A1C
Est. average glucose Bld gHb Est-mCnc: 91 mg/dL
Hgb A1c MFr Bld: 4.8 % (ref 4.8–5.6)

## 2022-04-06 LAB — TSH: TSH: 1.04 u[IU]/mL (ref 0.450–4.500)

## 2022-04-06 LAB — IRON,TIBC AND FERRITIN PANEL
Ferritin: 63 ng/mL (ref 15–150)
Iron Saturation: 25 % (ref 15–55)
Iron: 85 ug/dL (ref 27–159)
Total Iron Binding Capacity: 338 ug/dL (ref 250–450)
UIBC: 253 ug/dL (ref 131–425)

## 2022-04-06 LAB — CARDIOVASCULAR RISK ASSESSMENT

## 2022-04-06 LAB — LIPID PANEL
Chol/HDL Ratio: 3.5 ratio (ref 0.0–4.4)
Cholesterol, Total: 225 mg/dL — ABNORMAL HIGH (ref 100–199)
HDL: 65 mg/dL (ref >39)
LDL Chol Calc (NIH): 148 mg/dL — ABNORMAL HIGH (ref 0–99)
Triglycerides: 68 mg/dL (ref 0–149)
VLDL Cholesterol Cal: 12 mg/dL (ref 5–40)

## 2022-04-06 LAB — B12 AND FOLATE PANEL
Folate: 4.3 ng/mL (ref >3.0)
Vitamin B-12: 249 pg/mL (ref 232–1245)

## 2022-04-06 LAB — VITAMIN D 25 HYDROXY (VIT D DEFICIENCY, FRACTURES): Vit D, 25-Hydroxy: 22.8 ng/mL — ABNORMAL LOW (ref 30.0–100.0)

## 2022-04-06 NOTE — Progress Notes (Signed)
Established Patient Office Visit  Subjective:  Patient ID: Michelle Lamb, female    DOB: 1991-01-06  Age: 32 y.o. MRN: [id number]  CC:  Chief Complaint  Patient presents with   Anxiety   Depression   Diabetes    HPI Michelle Lamb presents for chronic follow up  Pt with history of mild intermittent asthma - stable on albuterol and symbicort  Pt with history of B12 deficiency - states last injection was last month.  She is having some malaise/fatigue  Pt with history of ADD - stable on vyvanse '40mg'$  qd  Pt with history of anxiety - uses ativan prn however she states she has been waking up in the night feeling anxious and having chest tightness - only occurring at night  Pt with history of GERD - stable on omeprazole  Pt with history of hepatic steatosis.  Was put on ozempic to help with this issue - states is tolerating medication well - due for labwork  Pt with history of Vit D def - taking supplement twice weekly  Pt had history of mass on right kidney with surgical removal and a clip was inadvertantly left on right ureter.  She has had intermittent problems with back pain and has seen urology for follow up - they believe she could be having ureter spasms and has been placed on oxybutynin  Past Medical History:  Diagnosis Date   ADHD    Anxiety    Asthma    Chronic kidney disease    Complication of anesthesia    Slow to wake up   Depression    Diabetes mellitus without complication ([place])    TYPE 2 LAST DOSE FRIDAY OCTOBER 27 ([facility])   Fatty liver    GERD (gastroesophageal reflux disease)    History of herpes simplex infection    Hyperlipidemia    Hypertension    no meds since gastric   Iron deficiency anemia 09/14/2021   Neoplasm of right kidney    Seasonal allergies    Sleep apnea    Vaginal delivery 2009, 2010, 2015   Vitamin D deficiency     Past Surgical History:  Procedure Laterality Date   CESAREAN SECTION  09/29/2016    CHOLECYSTECTOMY     CYSTOSCOPY W/ URETERAL STENT PLACEMENT Right 07/15/2021   Procedure: CYSTOSCOPY WITH RETROGRADE PYELOGRAM/URETERAL STENT PLACEMENT;  Surgeon: Raynelle Bring, MD;  Location: [facility];  Service: Urology;  Laterality: Right;   CYSTOSCOPY WITH RETROGRADE PYELOGRAM, URETEROSCOPY AND STENT PLACEMENT Right 05/03/2021   Procedure: CYSTOSCOPY WITH RIGHT RETROGRADE PYELOGRAM, URETEROSCOPY;  Surgeon: Raynelle Bring, MD;  Location: [facility];  Service: Urology;  Laterality: Right;   DILATION AND CURETTAGE OF UTERUS N/A 12/31/2015   Procedure: SUCTION DILATATION AND CURETTAGE;  Surgeon: Aletha Halim, MD;  Location: [facility];  Service: Gynecology;  Laterality: N/A;   DILATION AND EVACUATION N/A 01/01/2016   Procedure: DILATATION AND EVACUATION;  Surgeon: Jonnie Kind, MD;  Location: [facility];  Service: Gynecology;  Laterality: N/A;   ESOPHAGOGASTRODUODENOSCOPY     GASTRIC BYPASS  06/02/2020   IR NEPHRO TUBE REMOV/FL  07/16/2021   IR NEPHROSTOMY EXCHANGE RIGHT  05/12/2021   IR NEPHROSTOMY PLACEMENT RIGHT  05/05/2021   ROBOT ASSISTED PYELOPLASTY Right 07/15/2021   Procedure: XI ROBOTIC ASSISTED  LAPAROSCOPIC PYELOPLASTY;  Surgeon: Raynelle Bring, MD;  Location: [facility];  Service: Urology;  Laterality: Right;   ROBOTIC ASSITED PARTIAL NEPHRECTOMY Right 03/15/2021   Procedure: XI ROBOTIC ASSITED PARTIAL NEPHRECTOMY;  Surgeon: Alinda Money,  Wynetta Emery, MD;  Location: [facility];  Service: Urology;  Laterality: Right;   TONSILLECTOMY     TUBAL LIGATION      Family History  Problem Relation Age of Onset   Hypertension Mother    Thyroid disease Mother    Cervical cancer Mother        dx 12s   Stomach cancer Maternal Grandmother        dx 59s   Leukemia Maternal Grandfather        d. 46s   Depression Paternal Grandmother    Cancer Paternal Grandfather        dx after 51; mets   Stomach cancer Other        MGM's brother; dx after 70   Cancer Other        MGF's sisters x3; unknown primary   Breast cancer  Maternal Great-grandmother        dx <50; mets; MGF's mother    Social History   Socioeconomic History   Marital status: Married    Spouse name: Not on file   Number of children: 9   Years of education: 13   Highest education level: Futures trader degree: academic program  Occupational History   Occupation: Physiological scientist  Tobacco Use   Smoking status: Never   Smokeless tobacco: Never  Scientific laboratory technician Use: Never used  Substance and Sexual Activity   Alcohol use: Yes    Comment: OCCASIONAL   Drug use: No   Sexual activity: Yes    Partners: Male    Birth control/protection: Surgical    Comment: tubal  Other Topics Concern   Not on file  Social History Narrative   Not on file   Social Determinants of Health   Financial Resource Strain: Not on file  Food Insecurity: Not on file  Transportation Needs: Not on file  Physical Activity: Not on file  Stress: Not on file  Social Connections: Not on file  Intimate Partner Violence: Not on file     Current Outpatient Medications:    albuterol (VENTOLIN HFA) 108 (90 Base) MCG/ACT inhaler, Inhale 2 puffs into the lungs every 6 (six) hours as needed for wheezing or shortness of breath., Disp: 1 each, Rfl: 5   budesonide-formoterol (SYMBICORT) 80-4.5 MCG/ACT inhaler, Inhale 2 puffs into the lungs daily as needed (wheezing)., Disp: , Rfl:    cyclobenzaprine (FLEXERIL) 10 MG tablet, Take 1 tablet (10 mg total) by mouth 3 (three) times daily as needed for muscle spasms., Disp: 30 tablet, Rfl: 0   docusate sodium (COLACE) 100 MG capsule, Take 1 capsule (100 mg total) by mouth 2 (two) times daily. (Patient taking differently: Take 100 mg by mouth daily.), Disp: , Rfl:    fluticasone (FLONASE) 50 MCG/ACT nasal spray, Place 2 sprays into both nostrils daily., Disp: 16 g, Rfl: 6   lisdexamfetamine (VYVANSE) 40 MG capsule, Take 1 capsule (40 mg total) by mouth every morning., Disp: 30 capsule, Rfl: 0   LORazepam (ATIVAN) 1 MG  tablet, Take 1 tablet (1 mg total) by mouth daily as needed., Disp: 30 tablet, Rfl: 1   Norethindrone Acetate-Ethinyl Estrad-FE (JUNEL FE 24) 1-20 MG-MCG(24) tablet, Take 1 tablet by mouth daily., Disp: 84 tablet, Rfl: 2   omeprazole (PRILOSEC) 20 MG capsule, TAKE 1 CAPSULE TWICE DAILY, Disp: 180 capsule, Rfl: 1   ondansetron (ZOFRAN-ODT) 4 MG disintegrating tablet, Take 1 tablet (4 mg total) by mouth every 8 (eight) hours as needed for nausea or vomiting.,  Disp: 90 tablet, Rfl: 1   oxybutynin (DITROPAN) 5 MG tablet, Take 5 mg by mouth daily as needed for bladder spasms., Disp: , Rfl:    Semaglutide,0.25 or 0.'5MG'$ /DOS, (OZEMPIC, 0.25 OR 0.5 MG/DOSE,) 2 MG/3ML SOPN, Inject 0.5 mg into the skin once a week., Disp: 3 mL, Rfl: 3   cyanocobalamin (VITAMIN B12) 1000 MCG/ML injection, Inject 1 ML as directed once a month, Disp: 1 mL, Rfl: 2   [START ON 04/07/2022] Vitamin D, Ergocalciferol, (DRISDOL) 1.25 MG (50000 UNIT) CAPS capsule, Take 1 capsule (50,000 Units total) by mouth 2 (two) times a week., Disp: 24 capsule, Rfl: 1   No Known Allergies  ROS CONSTITUTIONAL: Negative for chills, fatigue, fever, unintentional weight gain and unintentional weight loss.  E/N/T: Negative for ear pain, nasal congestion and sore throat.  CARDIOVASCULAR: Negative for chest pain, dizziness, palpitations and pedal edema.  RESPIRATORY: Negative for recent cough and dyspnea.  GASTROINTESTINAL: Negative for abdominal pain, acid reflux symptoms, constipation, diarrhea, nausea and vomiting.  MSK: see HPI INTEGUMENTARY: Negative for rash.  NEUROLOGICAL: Negative for dizziness and headaches.  PSYCHIATRIC:see HPI    Objective:    PHYSICAL EXAM:   VS: BP 134/70   Pulse 92   Temp 98.7 F (37.1 C)   Resp 18   Ht '5\' 3"'$  (1.6 m)   Wt 171 lb (77.6 kg)   LMP 03/02/2022 (Approximate)   SpO2 97%   BMI 30.29 kg/m   GEN: Well nourished, well developed, in no acute distress  Cardiac: RRR; no murmurs, rubs, or gallops,no  edema -  Respiratory:  normal respiratory rate and pattern with no distress - normal breath sounds with no rales, rhonchi, wheezes or rubs GI: normal bowel sounds, no masses or tenderness Skin: warm and dry, no rash  Neuro:  Alert and Oriented x 3, - CN II-Xii grossly intact Psych: euthymic mood, appropriate affect and demeanor  EKG normal  There are no preventive care reminders to display for this patient.  There are no preventive care reminders to display for this patient.  Lab Results  Component Value Date   TSH 1.040 04/05/2022   Lab Results  Component Value Date   WBC 8.3 04/05/2022   HGB 13.6 04/05/2022   HCT 41.5 04/05/2022   MCV 88 04/05/2022   PLT 280 04/05/2022   Lab Results  Component Value Date   NA 139 04/05/2022   K 4.4 04/05/2022   CO2 23 04/05/2022   GLUCOSE 76 04/05/2022   BUN 12 04/05/2022   CREATININE 0.64 04/05/2022   BILITOT 0.6 04/05/2022   ALKPHOS 72 04/05/2022   AST 12 04/05/2022   ALT 9 04/05/2022   PROT 6.8 04/05/2022   ALBUMIN 4.4 04/05/2022   CALCIUM 9.5 04/05/2022   ANIONGAP 8 03/15/2022   EGFR 120 04/05/2022   Lab Results  Component Value Date   CHOL 225 (H) 04/05/2022   Lab Results  Component Value Date   HDL 65 04/05/2022   Lab Results  Component Value Date   LDLCALC 148 (H) 04/05/2022   Lab Results  Component Value Date   TRIG 68 04/05/2022   Lab Results  Component Value Date   CHOLHDL 3.5 04/05/2022   Lab Results  Component Value Date   HGBA1C 4.8 04/05/2022      Assessment & Plan:   Problem List Items Addressed This Visit       Digestive   Hepatic steatosis   Relevant Orders   Comprehensive metabolic panel (Completed)   Hemoglobin  A1c (Completed) Continue ozempic   Gastroesophageal reflux disease without esophagitis Continue omeprazole     Other   Mixed hyperlipidemia   Relevant Orders   Comprehensive metabolic panel (Completed)   Lipid panel (Completed)   Malaise - Primary   Relevant Orders    CBC with Differential/Platelet (Completed)   Comprehensive metabolic panel (Completed)   TSH (Completed)   Attention deficit hyperactivity disorder (ADHD), predominantly inattentive type Continue vyvanse   Absolute anemia   Relevant Medications   cyanocobalamin (VITAMIN B12) 1000 MCG/ML injection   Other Relevant Orders   B12 and Folate Panel (Completed)   Iron, TIBC and Ferritin Panel (Completed)   Other Visit Diagnoses     Vitamin D deficiency       Relevant Medications   Vitamin D, Ergocalciferol, (DRISDOL) 1.25 MG (50000 UNIT) CAPS capsule (Start on 04/07/2022)   Other Relevant Orders   VITAMIN D 25 Hydroxy (Vit-D Deficiency, Fractures) (Completed)   Anxiety       Relevant Orders   TSH (Completed)   Atypical chest pain       Relevant Orders   EKG 12-Lead (Completed)   Ureter colic     Continue oxybutynin and follow up with urology as directed       Meds ordered this encounter  Medications   cyanocobalamin (VITAMIN B12) 1000 MCG/ML injection    Sig: Inject 1 ML as directed once a month    Dispense:  1 mL    Refill:  2    Order Specific Question:   Supervising Provider    AnswerShelton Silvas   Vitamin D, Ergocalciferol, (DRISDOL) 1.25 MG (50000 UNIT) CAPS capsule    Sig: Take 1 capsule (50,000 Units total) by mouth 2 (two) times a week.    Dispense:  24 capsule    Refill:  1    Order Specific Question:   Supervising Provider    Answer:   Shelton Silvas    Follow-up: Return in about 6 months (around 10/04/2022) for chronic fasting follow up.    SARA R Jesper Stirewalt, PA-C

## 2022-04-15 ENCOUNTER — Inpatient Hospital Stay: Payer: Medicaid Other | Attending: Hematology and Oncology | Admitting: Genetic Counselor

## 2022-04-15 ENCOUNTER — Other Ambulatory Visit (HOSPITAL_BASED_OUTPATIENT_CLINIC_OR_DEPARTMENT_OTHER): Payer: Self-pay

## 2022-04-15 ENCOUNTER — Encounter: Payer: Self-pay | Admitting: Genetic Counselor

## 2022-04-15 ENCOUNTER — Other Ambulatory Visit: Payer: Self-pay

## 2022-04-15 ENCOUNTER — Other Ambulatory Visit: Payer: Self-pay | Admitting: Physician Assistant

## 2022-04-15 DIAGNOSIS — Z1509 Genetic susceptibility to other malignant neoplasm: Secondary | ICD-10-CM

## 2022-04-15 DIAGNOSIS — Z1379 Encounter for other screening for genetic and chromosomal anomalies: Secondary | ICD-10-CM

## 2022-04-15 MED ORDER — LISDEXAMFETAMINE DIMESYLATE 50 MG PO CAPS
50.0000 mg | ORAL_CAPSULE | Freq: Every day | ORAL | 0 refills | Status: DC
  Filled 2022-04-15: qty 30, 30d supply, fill #0

## 2022-04-15 NOTE — Progress Notes (Signed)
GENETIC TEST RESULTS  Patient Name: Michelle Lamb Patient Age: 32 y.o. Encounter Date: 04/15/2022  Referring Provider: Rosanne Sack, PA-C  Ms. Mayabb was seen in the [facility] clinic on January 28, 2022 due to a family history of cancer and concern regarding a hereditary predisposition to cancer in the family. Please refer to the prior Genetics clinic note for more information regarding Ms. Meinhardt's medical and family histories and our assessment at the time.   FAMILY HISTORY:  We obtained a detailed, 4-generation family history.  Significant diagnoses are listed below:      Family History  Problem Relation Age of Onset   Cervical cancer Mother          dx 25s   Stomach cancer Maternal Grandmother          dx 95s   Leukemia Maternal Grandfather          d. 20s   Cancer Paternal Grandfather          dx after 62; mets   Stomach cancer Other          MGM's brother; dx after 19   Cancer Other          MGF's sisters x3; unknown primary   Breast cancer Maternal Great-grandmother          dx <50; mets; MGF's mother       Ms. Whitby is unaware of previous family history of genetic testing for hereditary cancer risks.  Affected relatives or those more closely related to affected relatives are unavailable for genetic testing at this time. There is no reported Ashkenazi Jewish ancestry. There is no known consanguinity.    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    No other pathogenic variants were detected in [facility]RNAinsight Panel.    The CancerNext-Expanded gene panel offered by [facility] and includes sequencing, rearrangement, and RNA analysis for the following 77 genes: AIP, ALK, APC, ATM, AXIN2, BAP1, BARD1, BLM, BMPR1A, BRCA1, BRCA2, BRIP1, CDC73, CDH1, CDK4, CDKN1B, CDKN2A, CHEK2, CTNNA1, DICER1, FANCC, FH, FLCN, GALNT12, KIF1B, LZTR1, MAX, MEN1, MET,  MLH1, MSH2, MSH3, MSH6, MUTYH, NBN, NF1, NF2, NTHL1, PALB2, PHOX2B, PMS2, POT1, PRKAR1A, PTCH1, PTEN, RAD51C, RAD51D, RB1, RECQL, RET, SDHA, SDHAF2, SDHB, SDHC, SDHD, SMAD4, SMARCA4, SMARCB1, SMARCE1, STK11, SUFU, TMEM127, TP53, TSC1, TSC2, VHL and XRCC2 (sequencing and deletion/duplication); EGFR, EGLN1, HOXB13, KIT, MITF, PDGFRA, POLD1, and POLE (sequencing only); EPCAM and GREM1 (deletion/duplication only).     The test report has been scanned into EPIC and is located under the Molecular Pathology section of the Results Review tab.  A portion of the result report is included below for reference. Genetic testing reported out on February 23, 2022.        Clinical Information  The cancers associated with pathogenic variants in POT1 are: Cutaneous melanoma Chronic lymphocytic leukemia Angiosarcoma (especially cardaic) Gliomas  Lifetime risk for the above cancers is not clear.  Other cancers have also been reported in individuals with POT1 mutations; however, the risk is not well-established at this time.  Penetrance for POT1 mutations is incomplete, meaning not all POT1 carriers will develop POT1-related cancers.   Variable expresivity is observed for POT1 mutations; therefore, cancer risks will differ based on individual and family history.    Management Recommendations:  Skin cancer screening:  Clinical skin exams at least every 6 months starting at age 56 (skin exams starting at puberty  every 3-12 months have also been proposed) Exams may involve methods to track skin changes over time (longitudinal photography and digital dermoscopy) Frequency of skin exams may vary based on findings from previous exams and other risk factors Providers should consider low threshold for biopsies of suspicious skin findings in individuals with POT1 mutations Skin self-exams every month starting as early as possible Education on sun protection and skin cancer prevention  Use of sunscreen and  sun-protective clothing Avoiding tanning beds and excessive sun exposure  There are currently no consensus guidelines for non-melanoma cancer surveillance in POT1 tumor predisposition syndrome.  However, the following have been proposed:   Chronic lymphocytic leukemia  CBC with differential and comprehensive physical exam, including lymph nodes beginning at age 58  Angiosarcoma Consider whole-body MRI for POT1 carriers in families that meet Li-Fraumeni syndrome or Li-Fraumeni-like criteria OR in those with a personal and/or family history of non-cutaneous and non-brain malignancies  Brain tumors (gliomas) Consider brain MRI with and without contrast beginning at age 71.     As mentioned above, cancer risks will differ based on individual and family history; thus, POT1-related management should be tailored to individual and family history. This information is based on current understanding of the gene and may change in the future.  Implications for Family Members: Hereditary predisposition to cancer due to pathogenic variants in the POT1 gene has autosomal dominant inheritance. This means that first degree relatives (parents, full siblings, children) of someone with a pathogenic variant in POT1 has a 50% chance of having the same pathogenic variant.  Other family members (aunts, uncles, cousins, etc.) also having an increased chance of having the same family mutation. Identification of a pathogenic variant allows for the recognition of at-risk relatives who can pursue testing for the familial variant.  Family members are encouraged to consider genetic testing for this familial pathogenic variant. The majority of POT1 related cancers occur after the age of 36; therefore testing is not recommended in those before age 20 unless there is a known family history of POT1-related cancers at young ages. Family members may contact our office at [phone] for more information or to schedule an  appointment.  Complimentary testing for the familial variant is available for 90 days from the report date--02/23/2022.  Family members who live outside of the area are encouraged to find a genetic counselor in their area by visiting: [email or web address].  Resources: FORCE (Facing Our Risk of Cancer Empowered) is a resource for those with a hereditary predisposition to develop cancer.  FORCE provides information about risk reduction, advocacy, legislation, and clinical trials.  Additionally, [employer]CE provides a platform for collaboration and support which includes: peer navigation, message boards, local support groups, a toll-free helpline, research registry and recruitment, advocate training, published medical research, webinars, brochures, mastectomy photos, and more.  For more information, visit www.facingourrisk.org  Plan: Ms. Detzel PCP, [name], PA-C, was notified of result.  We recommend that Ms. Ferrer's PCP refer her to a dermatologist for regular skin checks.  Ms. Emmert states that she does have a lesion on her back that she thinks she should get checked.  We also recommend that her PCP conduct regular CBCs and physical exams.  She does not have a family history of known brain tumors or sarcomas; however, individual and family history should be monitored for consideration of additional screening-related imaging.   Family letter sharing letter shared with Ms. Negroni to encourage testing for parents, half brothers, and other relatives. We discussed that her  children do not need testing at this time given their age but should consider testing after age 80.  This may be re-evaluated if she learns of POT1-related cancers in the family before age 72.   Our contact information was provided, and Ms. March is welcome to contact us at any time.   Katsumi Wisler M. Joette Catching, [place], [facility] Genetic Counselor [email or web address] (P) [phone]

## 2022-04-19 ENCOUNTER — Other Ambulatory Visit: Payer: Self-pay | Admitting: Physician Assistant

## 2022-04-19 DIAGNOSIS — M62838 Other muscle spasm: Secondary | ICD-10-CM

## 2022-04-19 DIAGNOSIS — M5441 Lumbago with sciatica, right side: Secondary | ICD-10-CM

## 2022-04-19 MED ORDER — CYCLOBENZAPRINE HCL 10 MG PO TABS: 10.0000 mg | ORAL_TABLET | Freq: Three times a day (TID) | ORAL | 0 refills | Status: DC | PRN

## 2022-04-22 ENCOUNTER — Other Ambulatory Visit: Payer: Self-pay

## 2022-04-22 ENCOUNTER — Encounter: Payer: Self-pay | Admitting: Nurse Practitioner

## 2022-04-22 ENCOUNTER — Other Ambulatory Visit (HOSPITAL_COMMUNITY): Payer: Self-pay

## 2022-04-22 ENCOUNTER — Ambulatory Visit (INDEPENDENT_AMBULATORY_CARE_PROVIDER_SITE_OTHER): Payer: 59 | Admitting: Nurse Practitioner

## 2022-04-22 VITALS — BP 136/80 | HR 80 | Ht 63.0 in | Wt 174.2 lb

## 2022-04-22 DIAGNOSIS — K625 Hemorrhage of anus and rectum: Secondary | ICD-10-CM

## 2022-04-22 DIAGNOSIS — R1030 Lower abdominal pain, unspecified: Secondary | ICD-10-CM

## 2022-04-22 DIAGNOSIS — R1013 Epigastric pain: Secondary | ICD-10-CM

## 2022-04-22 DIAGNOSIS — K219 Gastro-esophageal reflux disease without esophagitis: Secondary | ICD-10-CM

## 2022-04-22 DIAGNOSIS — K5900 Constipation, unspecified: Secondary | ICD-10-CM

## 2022-04-22 MED ORDER — HYOSCYAMINE SULFATE 0.125 MG SL SUBL: 0.1250 mg | SUBLINGUAL_TABLET | Freq: Four times a day (QID) | SUBLINGUAL | 1 refills | Status: DC | PRN

## 2022-04-22 MED ORDER — CLENPIQ 10-3.5-12 MG-GM -GM/160ML PO SOLN: 1.0000 | ORAL | 0 refills | Status: DC

## 2022-04-22 NOTE — Progress Notes (Addendum)
04/22/2022 Michelle Lamb [id number] 1991/02/25   CHIEF COMPLAINT: Acid reflux, abdominal pain rectal bleeding  HISTORY OF PRESENT ILLNESS: Michelle Lamb is a 32 year old female with a past medical history of anxiety, depression, hypertension, hyperlipidemia, iron deficiency anemia, CKD, diabetes mellitus type 2, asthma, sleep apnea, hepatic steatosis, GERD and chronic lower abdominal pain. 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 She reported having prolonged sedation and following her cholecystectomy surgery.  No problems with sedation used at the time of her EGD 07/2020.  She presents to our office today for further evaluation regarding multiple GI symptoms. She endorses having acid reflux symptoms at least twice monthly with associated chest pain, feels like she is having a heart attack with sweats which last for 5 minutes then spontaneously abates. She describes having 3 similar episodes within the past 2 weeks. No specific food triggers.  These episodes do not awaken her from sleep. She has nausea without vomiting. She underwent a cholecystectomy due to having gallstones in 2013. EKG by PCP 04/05/2022 showed poor R wave progression V 1 - V3 without acute ischemia. She has right and left lower abdominal pain which comes and goes for the past year. She describes her lower abdominal pain as spasm-like.  She endorses going to the ED for 5 times over the past year which included multiple abdominal/pelvic CTs which were unrevealing.  Her most recent ED evaluation was 03/15/2022 due to having central abdominal pain below the bellybutton and RLQ pain.  WBC 10.3.  Hemoglobin 11.9.  She had  nausea without vomiting or diarrhea.  CTAP without acute abdominal/pelvic pathology to explain her symptoms.  Normal electrolytes. BUN 15.  Creatinine 0.58. Calcium 8.6.  LFTs were normal. She received pain meds and antiemetic and her symptoms stabilized therefore she was discharged home. Recently treated with antibiotics for recurrent UTI.  She has alternating diarrhea and constipation.  She can go 2 to 3 days without passing a bowel movement then passes a mushy or loose stool for few days and the cycle repeats.  She never feels emptied.  She often has abdominal bloat.  She has intermittent bright red blood with her bowel movements which she attributes to having hemorrhoids. She denies ever having a colonoscopy. She avoids NSAIDs.  No known family history of IBD.  Great uncle had esophageal and colon cancer.  She developed acute nausea, vomiting and bloody diarrhea in November or December 2023 for which she went to [facility] ED.  Reported a CTAP was negative, no evidence of colitis and she was diagnosed with suspected infectious gastroenteritis/intestinal infection and she was prescribed a course of antibiotics.  She started Ozempic 1 year ago which may be contributing to her numerous GI symptoms.  Prior history of anemia secondary to menorrhagia which abated after she started taking BCPS approximately 6 months ago.  Status post gastric bypass surgery 06/02/2020.  She developed postoperative nausea and vomiting, decreased p.o. intake.  She underwent to EGD 07/29/2020, staples were removed at the GJ anastomosis then dilatation of the GJ stenosis to 15 mm was performed.  S/P EGD [facility] 07/29/2020: Findings Site of previous surgery. Expected changes of RYGB were seen with a 5cm gastric pouch. The gastroenteric anastomosis was  mildly strictured, estimated at 12mm with open biopsy forceps. Stricture in the gastric pouch. The gastroenteric anastomosis was narrowed to a  diameter of 12mm.  We therefore placed a TTS balloon into the efferent jejunum and withdrew to seat the balloon across the narrowing and dilated to 12-89mm.  There was expected mucosal changes with this.  There were also staples present at the anastomosis which were removed with biopsy forceps. The esophagus appeared normal. There was no evident inflammation, Barrett's epithelium, stricture, or mass. The jejunum appeared normal. There was no inflammation, stricture, or villous architectural abnormalities. Roux limb inspected for 30cm distal to GJ anastomosis.  Impression Removal of staples at GJ anastomosis Dilation of GJ stenosis to 15mm     Latest Ref Rng & Units 04/05/2022    9:27 AM 03/15/2022    5:51 PM 02/18/2022   11:48 AM  CBC  WBC 3.4 - 10.8 x10E3/uL 8.3  10.3  9.5   Hemoglobin 11.1 - 15.9 g/dL 29.5  62.1  30.8   Hematocrit 34.0 - 46.6 % 41.5  36.2  39.4   Platelets 150 - 450 x10E3/uL 280  266  270        Latest Ref Rng & Units 04/05/2022    9:27 AM 03/15/2022    5:51 PM 02/18/2022   11:48 AM  CMP  Glucose 70 - 99 mg/dL 76  86  93   BUN 6 - 20 mg/dL 12  15  15    Creatinine 0.57 - 1.00 mg/dL 6.57  8.46  9.62   Sodium 134 - 144 mmol/L 139  136  141   Potassium 3.5 - 5.2 mmol/L 4.4  4.0  4.0   Chloride 96 - 106 mmol/L 101  104  104   CO2 20 - 29 mmol/L 23  24  21    Calcium 8.7 - 10.2 mg/dL 9.5  8.6  9.6   Total Protein 6.0 - 8.5 g/dL 6.8  6.9  6.7   Total Bilirubin 0.0 - 1.2 mg/dL 0.6  0.6  0.7   Alkaline Phos 44 - 121 IU/L 72  41  75   AST 0 - 40 IU/L 12  14  17    ALT 0 - 32 IU/L 9  15  20      CTAP with contrast 03/15/2022:  RADIATION DOSE REDUCTION: This exam was performed according to the departmental dose-optimization program which includes automated exposure control, adjustment of the mA and/or kV according to patient size and/or use of iterative reconstruction technique.   CONTRAST:  OMNIPAQUE IOHEXOL 300 MG/ML  SOLN   COMPARISON:  03/05/2022    FINDINGS: Lower chest: Lung bases are clear.   Hepatobiliary: Small low-attenuation lesion in the medial segment left lobe of liver measuring 1.7 cm diameter. There appears to be some peripheral enhancement suggesting probably a cavernous hemangioma. No change since prior study. Surgical absence of the gallbladder. No bile duct dilatation.   Pancreas: Unremarkable. No pancreatic ductal dilatation or surrounding inflammatory changes.   Spleen: Normal in size without focal abnormality.   Adrenals/Urinary Tract: Adrenal glands are unremarkable. Kidneys are normal, without renal calculi, focal lesion, or hydronephrosis. Bladder is unremarkable.   Stomach/Bowel: Postoperative changes consistent with gastric bypass. Stomach, small bowel, and colon are not abnormally distended. Scattered stool throughout the colon. No wall thickening or inflammatory changes. Appendix is normal.   Vascular/Lymphatic: No significant vascular findings are present. No enlarged abdominal or pelvic lymph nodes.   Reproductive: Uterus and bilateral adnexa are unremarkable.  Other: No free air or free fluid in the abdomen. Scarring in the anterior abdominal wall is likely postoperative. No change.   Musculoskeletal: No acute or significant osseous findings.   IMPRESSION: No acute process demonstrated in the abdomen or pelvis. No evidence of bowel obstruction or inflammation. No change since previous study.   Past Medical History:  Diagnosis Date   ADHD    Anxiety    Asthma    Chronic kidney disease    Complication of anesthesia    Slow to wake up   Depression    Diabetes mellitus without complication (HCC)    TYPE 2 LAST DOSE FRIDAY OCTOBER 27 (METFORMIN)   Fatty liver    GERD (gastroesophageal reflux disease)    History of herpes simplex infection    Hyperlipidemia    Hypertension    no meds since gastric   Iron deficiency anemia 09/14/2021   Neoplasm of right kidney    Seasonal allergies     Sleep apnea    Vaginal delivery 2009, 2010, 2015   Vitamin D deficiency    Past Surgical History:  Procedure Laterality Date   CESAREAN SECTION  09/29/2016   CHOLECYSTECTOMY     CYSTOSCOPY W/ URETERAL STENT PLACEMENT Right 07/15/2021   Procedure: CYSTOSCOPY WITH RETROGRADE PYELOGRAM/URETERAL STENT PLACEMENT;  Surgeon: Heloise Purpura, MD;  Location: [facility];  Service: Urology;  Laterality: Right;   CYSTOSCOPY WITH RETROGRADE PYELOGRAM, URETEROSCOPY AND STENT PLACEMENT Right 05/03/2021   Procedure: CYSTOSCOPY WITH RIGHT RETROGRADE PYELOGRAM, URETEROSCOPY;  Surgeon: Heloise Purpura, MD;  Location: [facility];  Service: Urology;  Laterality: Right;   DILATION AND CURETTAGE OF UTERUS N/A 12/31/2015   Procedure: SUCTION DILATATION AND CURETTAGE;  Surgeon:  Bing, MD;  Location: [facility];  Service: Gynecology;  Laterality: N/A;   DILATION AND EVACUATION N/A 01/01/2016   Procedure: DILATATION AND EVACUATION;  Surgeon: Tilda Burrow, MD;  Location: [facility];  Service: Gynecology;  Laterality: N/A;   ESOPHAGOGASTRODUODENOSCOPY     GASTRIC BYPASS  06/02/2020   IR NEPHRO TUBE REMOV/FL  07/16/2021   IR NEPHROSTOMY EXCHANGE RIGHT  05/12/2021   IR NEPHROSTOMY PLACEMENT RIGHT  05/05/2021   ROBOT ASSISTED PYELOPLASTY Right 07/15/2021   Procedure: XI ROBOTIC ASSISTED  LAPAROSCOPIC PYELOPLASTY;  Surgeon: Heloise Purpura, MD;  Location: [facility];  Service: Urology;  Laterality: Right;   ROBOTIC ASSITED PARTIAL NEPHRECTOMY Right 03/15/2021   Procedure: XI ROBOTIC ASSITED PARTIAL NEPHRECTOMY;  Surgeon: Heloise Purpura, MD;  Location: [facility];  Service: Urology;  Laterality: Right;   TONSILLECTOMY     TUBAL LIGATION     Social History: She is married.  She has 1 son and 3 daughters.  She is a CMA.  Non-smoker.  No alcohol use.  No drug use.  Family History: Maternal grandmother had stomach cancer.  Great uncle had esophageal and colon cancer.  Mother had retention, thyroid disease and cervical cancer. Paternal  grandfather had leukemia.   No known drug allergies   Outpatient Encounter Medications as of 04/22/2022  Medication Sig   albuterol (VENTOLIN HFA) 108 (90 Base) MCG/ACT inhaler Inhale 2 puffs into the lungs every 6 (six) hours as needed for wheezing or shortness of breath.   budesonide-formoterol (SYMBICORT) 80-4.5 MCG/ACT inhaler Inhale 2 puffs into the lungs daily as needed (wheezing).   cyanocobalamin (VITAMIN B12) 1000 MCG/ML injection Inject 1 ML as directed once a month   cyclobenzaprine (FLEXERIL) 10 MG tablet Take 1 tablet (10 mg total) by mouth 3 (three) times daily as  needed for muscle spasms.   docusate sodium (COLACE) 100 MG capsule Take 1 capsule (100 mg total) by mouth 2 (two) times daily. (Patient taking differently: Take 100 mg by mouth daily.)   fluticasone (FLONASE) 50 MCG/ACT nasal spray Place 2 sprays into both nostrils daily.   lisdexamfetamine (VYVANSE) 50 MG capsule Take 1 capsule (50 mg total) by mouth daily.   LORazepam (ATIVAN) 1 MG tablet Take 1 tablet (1 mg total) by mouth daily as needed.   Norethindrone Acetate-Ethinyl Estrad-FE (JUNEL FE 24) 1-20 MG-MCG(24) tablet Take 1 tablet by mouth daily.   omeprazole (PRILOSEC) 20 MG capsule TAKE 1 CAPSULE TWICE DAILY   ondansetron (ZOFRAN-ODT) 4 MG disintegrating tablet Take 1 tablet (4 mg total) by mouth every 8 (eight) hours as needed for nausea or vomiting.   oxybutynin (DITROPAN) 5 MG tablet Take 5 mg by mouth daily as needed for bladder spasms.   Semaglutide,0.25 or 0.5MG /DOS, (OZEMPIC, 0.25 OR 0.5 MG/DOSE,) 2 MG/3ML SOPN Inject 0.5 mg into the skin once a week.   Vitamin D, Ergocalciferol, (DRISDOL) 1.25 MG (50000 UNIT) CAPS capsule Take 1 capsule (50,000 Units total) by mouth 2 (two) times a week.   No facility-administered encounter medications on file as of 04/22/2022.    REVIEW OF SYSTEMS:  Gen: Denies fever, sweats or chills. No weight loss.  CV: Denies chest pain, palpitations or edema. Resp: Denies cough,  shortness of breath of hemoptysis.  GI: Denies heartburn, dysphagia, stomach or lower abdominal pain. No diarrhea or constipation.  GU : Recurrent UTIs. MS: Denies joint pain, muscles aches or weakness. Derm: Denies rash, itchiness, skin lesions or unhealing ulcers. Psych:+ Anxiety and depression.  Heme: Denies bruising, easy bleeding. Neuro:  Denies headaches, dizziness or paresthesias. Endo:  + Diabetes.  PHYSICAL EXAM: Ht 5\' 3"  (1.6 m)   Wt 174 lb 4 oz (79 kg)   BMI 30.87 kg/m  General: 32 year old female in no acute distress. Head: Normocephalic and atraumatic. Eyes:  Sclerae non-icteric, conjunctive pink. Ears: Normal auditory acuity. Mouth: Dentition intact. No ulcers or lesions.  Neck: Supple, no lymphadenopathy or thyromegaly.  Lungs: Clear bilaterally to auscultation without wheezes, crackles or rhonchi. Heart: Regular rate and rhythm. No murmur, rub or gallop appreciated.  Abdomen: Soft, nondistended.  Epigastric pain.  No masses. No hepatosplenomegaly. Normoactive bowel sounds x 4 quadrants.  Rectal: Deferred. Musculoskeletal: Symmetrical with no gross deformities. Skin: Warm and dry. No rash or lesions on visible extremities. Extremities: No edema. Neurological: Alert oriented x 4, no focal deficits.  Psychological:  Alert and cooperative. Normal mood and affect.  ASSESSMENT AND PLAN:  32 year old female s/p cholecystectomy secondary to gallstones 2013 with acid reflux with episodic esophageal spasms vs biliary chest pain vs atypical chest pain (unlikely cardiac etiology) with associated sweats with nausea without vomiting which abates spontaneously in 5 minutes.  I suspect she is passing micro choledocholithiasis. EKG 04/05/2022 showed poor R wave progression V 1 - V3 without evidence of acute ischemia.  -Check hepatic panel, lipase and CBC at time of next attack -Avoid fatty foods -Continue Omeprazole 20 mg twice daily -Hyoscyamine 0.125 mg 1 tab sublingual every 6-8  hours as needed -EGD to rule out GERD/esophagitis, benefits and risks discussed including risk with sedation, risk of bleeding, perforation and infection   -Patient to go to the ED if she has chest pain -Consider cardiology evaluation prior to proceeding with endoscopic evaluation, to discuss further with Dr. Barron Alvine   Status post gastric bypass surgery 06/02/2020. She developed  postoperative nausea and vomiting, decreased p.o. intake. EGD 07/29/2020, staples were removed at the GJ anastomosis then dilatation of the GJ stenosis to 15 mm was performed.  Biopsies were not performed.  Chronic right and left lower abdominal pain which comes and goes for the past year described as spasm-like.  Numerous CTAP's done in the ED were unrevealing. -Hyoscyamine as needed as ordered above  Alternating bowel pattern, constipation and diarrhea -Benefiber 1 tablespoon daily as tolerated -Check GI pathogen panel if patient has daily diarrhea  Intermittent bright red blood per the rectum with bloody diarrhea November or December 2023 for which she was evaluated at [facility] ED, suspect infectious gastroenteritis treated with a course of antibiotics. -Diagnostic colonoscopy benefits and risks discussed including risk with sedation, risk of bleeding, perforation and infection  -Consider cardiology evaluation prior to proceeding with endoscopic evaluation as noted above   DM type II.  On Ozempic. Consider discontinuing Ozempic if the above GI evaluation unrevealing   ADDENDUM: Patient to proceed with EGD/colonoscopy 07/22/2022 as cardiac clearance received by Robin Searing NP on 06/21/2022 as follows: Patient Name: [name]  DOB: 1991/01/03 MRN: [id number]   Primary Cardiologist: None   Chart reviewed as part of pre-operative protocol coverage. Given past medical history and time since last visit, based on ACC/AHA guidelines, Lucynda Rosano is at acceptable risk for the planned  procedure without further cardiovascular testing.  Per Dr. Tomie China patient is at low risk for scheduled surgical procedure.  He recommends meticulous hemodynamic monitoring through the perioperative and postprocedure.   The patient was advised that if she develops new symptoms prior to surgery to contact our office to arrange for a follow-up visit, and she verbalized understanding.   I will route this recommendation to the requesting party via Epic fax function and remove from pre-op pool.   Please call with questions.   Napoleon Form, Leodis Rains, NP  ADDENDUM: See phone note, patient to hold Ozempic for one week prior to procedure date.    CC:  Marianne Sofia, PA-C

## 2022-04-22 NOTE — Patient Instructions (Addendum)
You have been scheduled for an endoscopy and colonoscopy. Please follow the written instructions given to you at your visit today. Please pick up your prep supplies at the pharmacy within the next 1-3 days. If you use inhalers (even only as needed), please bring them with you on the day of your procedure.  We have sent the following medications to your pharmacy for you to pick up at your convenience: Clenpiq and Hyoscyamine  Benefiber- 1 tablespoon daily  Due to recent changes in healthcare laws, you may see the results of your imaging and laboratory studies on MyChart before your provider has had a chance to review them.  We understand that in some cases there may be results that are confusing or concerning to you. Not all laboratory results come back in the same time frame and the provider may be waiting for multiple results in order to interpret others.  Please give [place] 48 hours in order for your provider to thoroughly review all the results before contacting the office for clarification of your results.   Thank you for trusting me with your gastrointestinal care!   Carl Best, CRNP

## 2022-04-25 ENCOUNTER — Other Ambulatory Visit (HOSPITAL_COMMUNITY): Payer: Self-pay

## 2022-04-25 ENCOUNTER — Other Ambulatory Visit: Payer: Self-pay | Admitting: Physician Assistant

## 2022-04-25 DIAGNOSIS — F988 Other specified behavioral and emotional disorders with onset usually occurring in childhood and adolescence: Secondary | ICD-10-CM

## 2022-04-25 MED ORDER — LISDEXAMFETAMINE DIMESYLATE 50 MG PO CHEW: 50.0000 mg | CHEWABLE_TABLET | Freq: Every day | ORAL | 0 refills | Status: DC

## 2022-04-25 NOTE — Progress Notes (Signed)
Agree with the assessment and plan as outlined by Carl Best, NP. Agree with plan for EGD/Colonoscopy for further evaluation of myriad of GI sxs.    Gerrit Heck, DO, [facility]

## 2022-04-26 ENCOUNTER — Other Ambulatory Visit: Payer: Self-pay | Admitting: Physician Assistant

## 2022-04-26 ENCOUNTER — Ambulatory Visit (INDEPENDENT_AMBULATORY_CARE_PROVIDER_SITE_OTHER): Payer: 59

## 2022-04-26 DIAGNOSIS — N390 Urinary tract infection, site not specified: Secondary | ICD-10-CM

## 2022-04-26 LAB — POCT URINALYSIS DIP (CLINITEK)
Blood, UA: NEGATIVE
Glucose, UA: NEGATIVE mg/dL
Ketones, POC UA: NEGATIVE mg/dL
Nitrite, UA: NEGATIVE
Spec Grav, UA: >=1.03 — AB (ref 1.010–1.025)
Urobilinogen, UA: 0.2 E.U./dL
pH, UA: 5.5 (ref 5.0–8.0)

## 2022-04-26 MED ORDER — NITROFURANTOIN MONOHYD MACRO 100 MG PO CAPS: 100.0000 mg | ORAL_CAPSULE | Freq: Two times a day (BID) | ORAL | 0 refills | Status: DC

## 2022-04-28 LAB — URINE CULTURE

## 2022-05-08 NOTE — Progress Notes (Signed)
Remo Lipps, pls contact patient and let her know she will need cardiac clearance prior to proceeding with an EGD and colonoscopy which is currently scheduled with Dr. Bryan Lemma 06/17/2022. If she does not have a cardiologist, pls enter cardiology referral with request for cardiac clearance prior to proceeding with EGD and colonoscopy secondary to patient having atypical chest pain. Her currently scheduled egd/colonoscopy will need to be rescheduled to a later date if cardiac clearance not received at least one week prior to procedure date. THX.

## 2022-05-12 ENCOUNTER — Telehealth: Payer: Self-pay

## 2022-05-12 NOTE — Telephone Encounter (Signed)
Michelle Pick, NP  Gillermina Hu, RN      Previous Messages  Routed Note  Author: Noralyn Pick, NP Service: Gastroenterology Author Type: Nurse Practitioner  Filed: 05/08/2022  8:45 AM Encounter Date: 04/22/2022 Status: Signed  Editor: Michelle Pick, NP (Nurse Practitioner)   Michelle Lamb, pls contact patient and let her know she will need cardiac clearance prior to proceeding with an EGD and colonoscopy which is currently scheduled with Dr. Bryan Lemma 06/17/2022. If she does not have a cardiologist, pls enter cardiology referral with request for cardiac clearance prior to proceeding with EGD and colonoscopy secondary to patient having atypical chest pain. Her currently scheduled egd/colonoscopy will need to be rescheduled to a later date if cardiac clearance not received at least one week prior to procedure date. THX.

## 2022-05-12 NOTE — Telephone Encounter (Signed)
PT returning call. Please advise. 

## 2022-05-12 NOTE — Telephone Encounter (Signed)
Patient returning call.

## 2022-05-12 NOTE — Telephone Encounter (Signed)
Left message for pt to call back  °

## 2022-05-13 ENCOUNTER — Other Ambulatory Visit: Payer: Self-pay

## 2022-05-13 DIAGNOSIS — R0789 Other chest pain: Secondary | ICD-10-CM

## 2022-05-13 NOTE — Telephone Encounter (Unsigned)
Pt made aware of Michelle Best NP recommendations: Pt requested that referral be sent to the heart care in [place]. Referral was sent via Epic. Pt made aware. Pt aware that procedure may have to be rescheduled if cardiac clearance not received at least one week prior to procedure date.  Pt verbalized understanding with all questions answered.

## 2022-05-16 ENCOUNTER — Other Ambulatory Visit: Payer: Self-pay

## 2022-05-16 DIAGNOSIS — F418 Other specified anxiety disorders: Secondary | ICD-10-CM

## 2022-05-16 MED ORDER — LORAZEPAM 1 MG PO TABS: 1.0000 mg | ORAL_TABLET | Freq: Every day | ORAL | 1 refills | Status: DC | PRN

## 2022-05-16 MED ORDER — OZEMPIC (0.25 OR 0.5 MG/DOSE) 2 MG/3ML ~~LOC~~ SOPN: 0.5000 mg | PEN_INJECTOR | SUBCUTANEOUS | 3 refills | Status: DC

## 2022-05-17 ENCOUNTER — Encounter: Payer: Self-pay | Admitting: Physician Assistant

## 2022-05-17 ENCOUNTER — Ambulatory Visit (INDEPENDENT_AMBULATORY_CARE_PROVIDER_SITE_OTHER): Payer: 59 | Admitting: Physician Assistant

## 2022-05-17 VITALS — BP 110/60 | HR 80 | Temp 97.3°F | Ht 63.0 in | Wt 173.0 lb

## 2022-05-17 DIAGNOSIS — M545 Low back pain, unspecified: Secondary | ICD-10-CM | POA: Diagnosis not present

## 2022-05-17 LAB — POCT URINALYSIS DIPSTICK
Bilirubin, UA: NEGATIVE
Blood, UA: NEGATIVE
Glucose, UA: NEGATIVE
Ketones, UA: NEGATIVE
Nitrite, UA: NEGATIVE
Protein, UA: NEGATIVE
Spec Grav, UA: <=1.005 — AB (ref 1.010–1.025)
Urobilinogen, UA: NEGATIVE E.U./dL — AB
pH, UA: 6 (ref 5.0–8.0)

## 2022-05-17 MED ORDER — TRAMADOL HCL 50 MG PO TABS: 50.0000 mg | ORAL_TABLET | Freq: Three times a day (TID) | ORAL | 0 refills | Status: AC | PRN

## 2022-05-18 NOTE — Progress Notes (Signed)
Acute Office Visit  Subjective:    Patient ID: Michelle Lamb, female    DOB: [birth year], 32 y.o.   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, hauling materials etc and began having low back pain bilaterally with bending and walking She has been applying heating pads - does have flexeril at home  Past Medical History:  Diagnosis Date   ADHD    Anxiety    Asthma    Chronic kidney disease    Complication of anesthesia    Slow to wake up   Depression    Diabetes mellitus without complication ([place])    TYPE 2 LAST DOSE FRIDAY OCTOBER 27 (METFORMIN)   Fatty liver    GERD (gastroesophageal reflux disease)    History of herpes simplex infection    Hyperlipidemia    Hypertension    no meds since gastric   Iron deficiency anemia 09/14/2021   Neoplasm of right kidney    Seasonal allergies    Sleep apnea    Vaginal delivery 2009, 2010, 2015   Vitamin D deficiency     Past Surgical History:  Procedure Laterality Date   CESAREAN SECTION  09/29/2016   CHOLECYSTECTOMY     CYSTOSCOPY W/ URETERAL STENT PLACEMENT Right 07/15/2021   Procedure: CYSTOSCOPY WITH RETROGRADE PYELOGRAM/URETERAL STENT PLACEMENT;  Surgeon: Raynelle Bring, MD;  Location: [facility];  Service: Urology;  Laterality: Right;   CYSTOSCOPY WITH RETROGRADE PYELOGRAM, URETEROSCOPY AND STENT PLACEMENT Right 05/03/2021   Procedure: CYSTOSCOPY WITH RIGHT RETROGRADE PYELOGRAM, URETEROSCOPY;  Surgeon: Raynelle Bring, MD;  Location: [facility];  Service: Urology;  Laterality: Right;   DILATION AND CURETTAGE OF UTERUS N/A 12/31/2015   Procedure: SUCTION DILATATION AND CURETTAGE;  Surgeon: Aletha Halim, MD;  Location: [facility];  Service: Gynecology;  Laterality: N/A;   DILATION AND EVACUATION N/A 01/01/2016   Procedure:  DILATATION AND EVACUATION;  Surgeon: Jonnie Kind, MD;  Location: [facility];  Service: Gynecology;  Laterality: N/A;   ESOPHAGOGASTRODUODENOSCOPY     GASTRIC BYPASS  06/02/2020   IR NEPHRO TUBE REMOV/FL  07/16/2021   IR NEPHROSTOMY EXCHANGE RIGHT  05/12/2021   IR NEPHROSTOMY PLACEMENT RIGHT  05/05/2021   ROBOT ASSISTED PYELOPLASTY Right 07/15/2021   Procedure: XI ROBOTIC ASSISTED  LAPAROSCOPIC PYELOPLASTY;  Surgeon: Raynelle Bring, MD;  Location: [facility];  Service: Urology;  Laterality: Right;   ROBOTIC ASSITED PARTIAL NEPHRECTOMY Right 03/15/2021   Procedure: XI ROBOTIC ASSITED PARTIAL NEPHRECTOMY;  Surgeon: Raynelle Bring, MD;  Location: [facility];  Service: Urology;  Laterality: Right;   TONSILLECTOMY     TUBAL LIGATION      Family History  Problem Relation Age of Onset   Hypertension Mother    Thyroid disease Mother    Cervical cancer Mother        dx 41s   Stomach cancer Maternal Grandmother        dx 68s   Leukemia Maternal Grandfather        d. 3s   Depression Paternal Grandmother    Cancer Paternal Grandfather        dx after 48; mets   Stomach cancer Other        MGM's brother; dx after 33   Cancer Other  MGF's sisters x3; unknown primary   Breast cancer Maternal Great-grandmother        dx <50; mets; MGF's mother    Social History   Socioeconomic History   Marital status: Married    Spouse name: Not on file   Number of children: 9   Years of education: 13   Highest education level: Associate degree: academic program  Occupational History   Occupation: Physiological scientist  Tobacco Use   Smoking status: Never   Smokeless tobacco: Never  Scientific laboratory technician Use: Never used  Substance and Sexual Activity   Alcohol use: Yes    Comment: OCCASIONAL   Drug use: No   Sexual activity: Yes    Partners: Male    Birth control/protection: Surgical    Comment: tubal  Other Topics Concern   Not on file  Social History Narrative   Not on file   Social  Determinants of Health   Financial Resource Strain: Not on file  Food Insecurity: Not on file  Transportation Needs: Not on file  Physical Activity: Not on file  Stress: Not on file  Social Connections: Not on file  Intimate Partner Violence: Not on file    Outpatient Medications Prior to Visit  Medication Sig Dispense Refill   albuterol (VENTOLIN HFA) 108 (90 Base) MCG/ACT inhaler Inhale 2 puffs into the lungs every 6 (six) hours as needed for wheezing or shortness of breath. 1 each 5   budesonide-formoterol (SYMBICORT) 80-4.5 MCG/ACT inhaler Inhale 2 puffs into the lungs daily as needed (wheezing).     cyanocobalamin (VITAMIN B12) 1000 MCG/ML injection Inject 1 ML as directed once a month 1 mL 2   cyclobenzaprine (FLEXERIL) 10 MG tablet Take 1 tablet (10 mg total) by mouth 3 (three) times daily as needed for muscle spasms. 30 tablet 0   docusate sodium (COLACE) 100 MG capsule Take 1 capsule (100 mg total) by mouth 2 (two) times daily. (Patient taking differently: Take 100 mg by mouth daily.)     fluticasone (FLONASE) 50 MCG/ACT nasal spray Place 2 sprays into both nostrils daily. 16 g 6   hyoscyamine (LEVSIN SL) 0.125 MG SL tablet Place 1 tablet (0.125 mg total) under the tongue every 6 (six) hours as needed. 30 tablet 1   Lisdexamfetamine Dimesylate (VYVANSE) 50 MG CHEW Chew 1 tablet (50 mg total) by mouth daily. 30 tablet 0   LORazepam (ATIVAN) 1 MG tablet Take 1 tablet (1 mg total) by mouth daily as needed. 30 tablet 1   Norethindrone Acetate-Ethinyl Estrad-FE (JUNEL FE 24) 1-20 MG-MCG(24) tablet Take 1 tablet by mouth daily. 84 tablet 2   omeprazole (PRILOSEC) 20 MG capsule TAKE 1 CAPSULE TWICE DAILY 180 capsule 1   ondansetron (ZOFRAN-ODT) 4 MG disintegrating tablet Take 1 tablet (4 mg total) by mouth every 8 (eight) hours as needed for nausea or vomiting. 90 tablet 1   Semaglutide,0.25 or 0.5MG /DOS, (OZEMPIC, 0.25 OR 0.5 MG/DOSE,) 2 MG/3ML SOPN Inject 0.5 mg into the skin once a  week. 3 mL 3   Sod Picosulfate-Mag Ox-Cit Acd (CLENPIQ) 10-3.5-12 MG-GM -GM/160ML SOLN Take 1 kit by mouth as directed. 320 mL 0   Vitamin D, Ergocalciferol, (DRISDOL) 1.25 MG (50000 UNIT) CAPS capsule Take 1 capsule (50,000 Units total) by mouth 2 (two) times a week. 24 capsule 1   nitrofurantoin, macrocrystal-monohydrate, (MACROBID) 100 MG capsule Take 1 capsule (100 mg total) by mouth 2 (two) times daily. 20 capsule 0   oxybutynin (DITROPAN) 5  MG tablet Take 5 mg by mouth daily as needed for bladder spasms.     No facility-administered medications prior to visit.    No Known Allergies  Review of Systems CONSTITUTIONAL: Negative for chills, fatigue, fever,   CARDIOVASCULAR: Negative for chest pain,  RESPIRATORY: Negative for recent cough and dyspnea.  GASTROINTESTINAL: Negative for abdominal pain, acid reflux symptoms, constipation, diarrhea, nausea and vomiting.  MSK:see HPI  Office Visit on 05/17/2022  Component Date Value Ref Range Status   Glucose, UA 05/17/2022 Negative  Negative Final   Bilirubin, UA 05/17/2022 NEGATIVE   Final   Ketones, UA 05/17/2022 NEGATIVE   Final   Spec Grav, UA 05/17/2022 <=1.005 (A)  1.010 - 1.025 Final   Blood, UA 05/17/2022 NEGATIVE   Final   pH, UA 05/17/2022 6.0  5.0 - 8.0 Final   Protein, UA 05/17/2022 Negative  Negative Final   Urobilinogen, UA 05/17/2022 negative (A)  0.2 or 1.0 E.U./dL Final   Nitrite, UA 05/17/2022 NEGATIVE   Final   Leukocytes, UA 05/17/2022 Moderate (2+) (A)  Negative Final       Objective:    PHYSICAL EXAM:   VS: BP 110/60 (BP Location: Left Arm, Patient Position: Sitting)   Pulse 80   Temp (!) 97.3 F (36.3 C) (Temporal)   Ht 5\' 3"  (1.6 m)   Wt 173 lb (78.5 kg)   SpO2 99%   BMI 30.65 kg/m   GEN: Well nourished, well developed, in no acute distress  Cardiac: RRR; no murmurs, rubs, or gallops,no edema -  Respiratory:  normal respiratory rate and pattern with no distress - normal breath sounds with no rales,  rhonchi, wheezes or rubs [id number] to lower back with muscle spasm noted   Lab Results  Component Value Date   TSH 1.040 04/05/2022   Lab Results  Component Value Date   WBC 8.3 04/05/2022   HGB 13.6 04/05/2022   HCT 41.5 04/05/2022   MCV 88 04/05/2022   PLT 280 04/05/2022   Lab Results  Component Value Date   NA 139 04/05/2022   K 4.4 04/05/2022   CO2 23 04/05/2022   GLUCOSE 76 04/05/2022   BUN 12 04/05/2022   CREATININE 0.64 04/05/2022   BILITOT 0.6 04/05/2022   ALKPHOS 72 04/05/2022   AST 12 04/05/2022   ALT 9 04/05/2022   PROT 6.8 04/05/2022   ALBUMIN 4.4 04/05/2022   CALCIUM 9.5 04/05/2022   ANIONGAP 8 03/15/2022   EGFR 120 04/05/2022   Lab Results  Component Value Date   CHOL 225 (H) 04/05/2022   Lab Results  Component Value Date   HDL 65 04/05/2022   Lab Results  Component Value Date   LDLCALC 148 (H) 04/05/2022   Lab Results  Component Value Date   TRIG 68 04/05/2022   Lab Results  Component Value Date   CHOLHDL 3.5 04/05/2022   Lab Results  Component Value Date   HGBA1C 4.8 04/05/2022       Assessment & Plan:  Acute bilateral low back pain without sciatica -     POCT urinalysis dipstick -     traMADol HCl; Take 1 tablet (50 mg total) by mouth every 8 (eight) hours as needed for up to 5 days.  Dispense: 15 tablet; Refill: 0  Acute midline low back pain without sciatica   Use flexeril as directed and alternate heat and ice  Meds ordered this encounter  Medications   traMADol (ULTRAM) 50 MG tablet    Sig:  Take 1 tablet (50 mg total) by mouth every 8 (eight) hours as needed for up to 5 days.    Dispense:  15 tablet    Refill:  0    Order Specific Question:   Supervising Provider    AnswerShelton Silvas    Orders Placed This Encounter  Procedures   POCT urinalysis dipstick     Follow-up: Return if symptoms worsen or fail to improve.  An After Visit Summary was printed and given to the patient.  Yetta Flock [facility] [phone]

## 2022-05-19 ENCOUNTER — Telehealth: Payer: Self-pay

## 2022-05-19 NOTE — Telephone Encounter (Signed)
Chart reviewed: Pt has been scheduled to Dr. Jyl Heinz with Cardiology on 06/08/2022. Cardiac Clearance was sent to  Dr. Jyl Heinz

## 2022-05-19 NOTE — Telephone Encounter (Signed)
Message was routed to Dr. Geraldo Pitter due to pt has an office visit scheduled on 06/08/2022 at 10:40

## 2022-05-19 NOTE — Telephone Encounter (Signed)
[facility] Pre-operative Risk Assessment     Michelle Lamb [birth year] [id number]  Procedure: Endoscopy/Colonoscopy Anesthesia type:  MAC   Propofol Procedure Date: 06/17/2022 Provider: Dr. Bryan Lemma  Type of Clearance needed: Cardiac  Medication(s) needing held: N/A   Length of time for medication to be held: N/A  Please review request and advise by either responding to this message or by sending your response to the fax # provided below.  Thank you,  [facility]  Phone: [phone] Fax: [phone] ATTENTION: Gillermina Hu RN

## 2022-05-22 DIAGNOSIS — J398 Other specified diseases of upper respiratory tract: Secondary | ICD-10-CM | POA: Diagnosis not present

## 2022-05-22 DIAGNOSIS — R070 Pain in throat: Secondary | ICD-10-CM | POA: Diagnosis not present

## 2022-05-22 DIAGNOSIS — R52 Pain, unspecified: Secondary | ICD-10-CM | POA: Diagnosis not present

## 2022-05-24 ENCOUNTER — Other Ambulatory Visit: Payer: Self-pay

## 2022-05-24 ENCOUNTER — Other Ambulatory Visit (HOSPITAL_COMMUNITY): Payer: Self-pay

## 2022-05-24 ENCOUNTER — Other Ambulatory Visit: Payer: Self-pay | Admitting: Physician Assistant

## 2022-05-24 ENCOUNTER — Encounter: Payer: Self-pay | Admitting: Hematology and Oncology

## 2022-05-24 DIAGNOSIS — F988 Other specified behavioral and emotional disorders with onset usually occurring in childhood and adolescence: Secondary | ICD-10-CM

## 2022-05-24 MED ORDER — LISDEXAMFETAMINE DIMESYLATE 50 MG PO CHEW
50.0000 mg | CHEWABLE_TABLET | Freq: Every day | ORAL | 0 refills | Status: DC
  Filled 2022-05-24: qty 30, 30d supply, fill #0

## 2022-05-24 MED ORDER — LISDEXAMFETAMINE DIMESYLATE 50 MG PO CAPS
50.0000 mg | ORAL_CAPSULE | Freq: Every day | ORAL | 0 refills | Status: DC
  Filled 2022-05-24: qty 30, 30d supply, fill #0

## 2022-05-24 NOTE — Telephone Encounter (Signed)
Primary Cardiologist:None  Chart reviewed as part of pre-operative protocol coverage. Because of Michelle Lamb's past medical history and time since last visit, he/she will require a follow-up visit in order to better assess preoperative cardiovascular risk.  Pre-op covering staff: - Patient has a new patient appointment on 06/08/22 with Dr. Geraldo Pitter at which time clearance will be addressed. Appointment notes have been updated.  - Please contact requesting surgeon's office via preferred method (i.e, phone, fax) to inform them of need for appointment prior to surgery.  Emmaline Life, NP-C  05/24/2022, 11:58 AM [address] Office [phone] Fax [phone]

## 2022-05-25 ENCOUNTER — Other Ambulatory Visit (HOSPITAL_COMMUNITY): Payer: Self-pay

## 2022-05-26 ENCOUNTER — Encounter: Payer: Self-pay | Admitting: Physician Assistant

## 2022-05-26 ENCOUNTER — Ambulatory Visit (INDEPENDENT_AMBULATORY_CARE_PROVIDER_SITE_OTHER): Payer: 59 | Admitting: Physician Assistant

## 2022-05-26 VITALS — BP 126/70 | HR 93 | Temp 97.6°F | Resp 12 | Ht 63.0 in | Wt 170.6 lb

## 2022-05-26 DIAGNOSIS — J060 Acute laryngopharyngitis: Secondary | ICD-10-CM | POA: Diagnosis not present

## 2022-05-26 MED ORDER — TRIAMCINOLONE ACETONIDE 40 MG/ML IJ SUSP
60.0000 mg | Freq: Once | INTRAMUSCULAR | Status: AC
  Administered 2022-05-26: 60 mg via INTRAMUSCULAR

## 2022-05-26 MED ORDER — PROMETHAZINE-DM 6.25-15 MG/5ML PO SYRP: 5.0000 mL | ORAL_SOLUTION | Freq: Four times a day (QID) | ORAL | 0 refills | Status: DC | PRN

## 2022-05-26 MED ORDER — AZITHROMYCIN 250 MG PO TABS: ORAL_TABLET | ORAL | 0 refills | Status: AC

## 2022-05-26 NOTE — Progress Notes (Signed)
Acute Office Visit  Subjective:    Patient ID: Michelle Lamb, female    DOB: 1991/02/26, 32 y.o.   MRN: [id number]  Chief Complaint  Patient presents with   Cough       HPI: Patient is in today for complaints of cough, congestion and pnd for the past several days - she was seen at urgent Care on Sunday and tested negative for COVID, RSV, flu and strep - she was given rx for prednisone (but did not pick up rx) Symptoms have persisted and now has productive cough and malaise  Past Medical History:  Diagnosis Date   ADHD    Anxiety    Asthma    Chronic kidney disease    Complication of anesthesia    Slow to wake up   Depression    Diabetes mellitus without complication ([place])    TYPE 2 LAST DOSE FRIDAY OCTOBER 27 (METFORMIN)   Fatty liver    GERD (gastroesophageal reflux disease)    History of herpes simplex infection    Hyperlipidemia    Hypertension    no meds since gastric   Iron deficiency anemia 09/14/2021   Neoplasm of right kidney    Seasonal allergies    Sleep apnea    Vaginal delivery 2009, 2010, 2015   Vitamin D deficiency     Past Surgical History:  Procedure Laterality Date   CESAREAN SECTION  09/29/2016   CHOLECYSTECTOMY     CYSTOSCOPY W/ URETERAL STENT PLACEMENT Right 07/15/2021   Procedure: CYSTOSCOPY WITH RETROGRADE PYELOGRAM/URETERAL STENT PLACEMENT;  Surgeon: Raynelle Bring, MD;  Location: [facility];  Service: Urology;  Laterality: Right;   CYSTOSCOPY WITH RETROGRADE PYELOGRAM, URETEROSCOPY AND STENT PLACEMENT Right 05/03/2021   Procedure: CYSTOSCOPY WITH RIGHT RETROGRADE PYELOGRAM, URETEROSCOPY;  Surgeon: Raynelle Bring, MD;  Location: [facility];  Service: Urology;  Laterality: Right;   DILATION AND CURETTAGE OF UTERUS N/A 12/31/2015   Procedure: SUCTION DILATATION AND CURETTAGE;  Surgeon: Aletha Halim, MD;  Location: [facility];  Service: Gynecology;  Laterality: N/A;   DILATION AND EVACUATION N/A 01/01/2016   Procedure: DILATATION AND EVACUATION;   Surgeon: Jonnie Kind, MD;  Location: [facility];  Service: Gynecology;  Laterality: N/A;   ESOPHAGOGASTRODUODENOSCOPY     GASTRIC BYPASS  06/02/2020   IR NEPHRO TUBE REMOV/FL  07/16/2021   IR NEPHROSTOMY EXCHANGE RIGHT  05/12/2021   IR NEPHROSTOMY PLACEMENT RIGHT  05/05/2021   ROBOT ASSISTED PYELOPLASTY Right 07/15/2021   Procedure: XI ROBOTIC ASSISTED  LAPAROSCOPIC PYELOPLASTY;  Surgeon: Raynelle Bring, MD;  Location: [facility];  Service: Urology;  Laterality: Right;   ROBOTIC ASSITED PARTIAL NEPHRECTOMY Right 03/15/2021   Procedure: XI ROBOTIC ASSITED PARTIAL NEPHRECTOMY;  Surgeon: Raynelle Bring, MD;  Location: [facility];  Service: Urology;  Laterality: Right;   TONSILLECTOMY     TUBAL LIGATION      Family History  Problem Relation Age of Onset   Hypertension Mother    Thyroid disease Mother    Cervical cancer Mother        dx 42s   Stomach cancer Maternal Grandmother        dx 41s   Leukemia Maternal Grandfather        d. 73s   Depression Paternal Grandmother    Cancer Paternal Grandfather        dx after 36; mets   Stomach cancer Other        MGM's brother; dx after 11   Cancer Other  MGF's sisters x3; unknown primary   Breast cancer Maternal Great-grandmother        dx <50; mets; MGF's mother    Social History   Socioeconomic History   Marital status: Married    Spouse name: Not on file   Number of children: 9   Years of education: 13   Highest education level: Associate degree: academic program  Occupational History   Occupation: Physiological scientist  Tobacco Use   Smoking status: Never   Smokeless tobacco: Never  Vaping Use   Vaping Use: Never used  Substance and Sexual Activity   Alcohol use: Yes    Alcohol/week: 1.0 standard drink of alcohol    Types: 1 Standard drinks or equivalent per week    Comment: OCCASIONAL   Drug use: No   Sexual activity: Yes    Partners: Male    Birth control/protection: Surgical    Comment: tubal  Other Topics Concern    Not on file  Social History Narrative   Not on file   Social Determinants of Health   Financial Resource Strain: Not on file  Food Insecurity: Not on file  Transportation Needs: Not on file  Physical Activity: Not on file  Stress: Not on file  Social Connections: Not on file  Intimate Partner Violence: Not on file    Outpatient Medications Prior to Visit  Medication Sig Dispense Refill   albuterol (VENTOLIN HFA) 108 (90 Base) MCG/ACT inhaler Inhale 2 puffs into the lungs every 6 (six) hours as needed for wheezing or shortness of breath. 1 each 5   cyanocobalamin (VITAMIN B12) 1000 MCG/ML injection Inject 1 ML as directed once a month 1 mL 2   cyclobenzaprine (FLEXERIL) 10 MG tablet Take 1 tablet (10 mg total) by mouth 3 (three) times daily as needed for muscle spasms. 30 tablet 0   docusate sodium (COLACE) 100 MG capsule Take 1 capsule (100 mg total) by mouth 2 (two) times daily. (Patient taking differently: Take 100 mg by mouth daily.)     fluticasone (FLONASE) 50 MCG/ACT nasal spray Place 2 sprays into both nostrils daily. 16 g 6   hyoscyamine (LEVSIN SL) 0.125 MG SL tablet Place 1 tablet (0.125 mg total) under the tongue every 6 (six) hours as needed. 30 tablet 1   lisdexamfetamine (VYVANSE) 50 MG capsule Take 1 capsule (50 mg total) by mouth daily. 30 capsule 0   LORazepam (ATIVAN) 1 MG tablet Take 1 tablet (1 mg total) by mouth daily as needed. 30 tablet 1   Norethindrone Acetate-Ethinyl Estrad-FE (JUNEL FE 24) 1-20 MG-MCG(24) tablet Take 1 tablet by mouth daily. 84 tablet 2   omeprazole (PRILOSEC) 20 MG capsule TAKE 1 CAPSULE TWICE DAILY 180 capsule 1   ondansetron (ZOFRAN-ODT) 4 MG disintegrating tablet Take 1 tablet (4 mg total) by mouth every 8 (eight) hours as needed for nausea or vomiting. 90 tablet 1   Semaglutide,0.25 or 0.5MG /DOS, (OZEMPIC, 0.25 OR 0.5 MG/DOSE,) 2 MG/3ML SOPN Inject 0.5 mg into the skin once a week. 3 mL 3   Sod Picosulfate-Mag Ox-Cit Acd (CLENPIQ)  10-3.5-12 MG-GM -GM/160ML SOLN Take 1 kit by mouth as directed. 320 mL 0   Vitamin D, Ergocalciferol, (DRISDOL) 1.25 MG (50000 UNIT) CAPS capsule Take 1 capsule (50,000 Units total) by mouth 2 (two) times a week. 24 capsule 1   budesonide-formoterol (SYMBICORT) 80-4.5 MCG/ACT inhaler Inhale 2 puffs into the lungs daily as needed (wheezing). (Patient not taking: Reported on 05/26/2022)     No  facility-administered medications prior to visit.    No Known Allergies  Review of Systems    CONSTITUTIONAL: see HPI E/N/T:see HPI CARDIOVASCULAR: Negative for chest pain, RESPIRATORY: see HPI GASTROINTESTINAL: Negative for abdominal pain, acid reflux symptoms, constipation, diarrhea, nausea and vomiting.   Objective:    PHYSICAL EXAM:   VS: BP 126/70   Pulse 93   Temp 97.6 F (36.4 C)   Resp 12   Ht 5\' 3"  (1.6 m)   Wt 170 lb 9.6 oz (77.4 kg)   LMP 04/28/2022 (Exact Date)   SpO2 99%   BMI 30.22 kg/m   GEN: Well nourished, well developed, in no acute distress  HEENT: normal external ears and nose - normal external auditory canals and TMS - - Lips, Teeth and Gums - normal  Oropharynx - mild erythema/pnd Cardiac: RRR; no murmurs, rubs, or gallops,no edema - Respiratory:  scattered rhonchi - clears with cough    Lab Results  Component Value Date   TSH 1.040 04/05/2022   Lab Results  Component Value Date   WBC 8.3 04/05/2022   HGB 13.6 04/05/2022   HCT 41.5 04/05/2022   MCV 88 04/05/2022   PLT 280 04/05/2022   Lab Results  Component Value Date   NA 139 04/05/2022   K 4.4 04/05/2022   CO2 23 04/05/2022   GLUCOSE 76 04/05/2022   BUN 12 04/05/2022   CREATININE 0.64 04/05/2022   BILITOT 0.6 04/05/2022   ALKPHOS 72 04/05/2022   AST 12 04/05/2022   ALT 9 04/05/2022   PROT 6.8 04/05/2022   ALBUMIN 4.4 04/05/2022   CALCIUM 9.5 04/05/2022   ANIONGAP 8 03/15/2022   EGFR 120 04/05/2022   Lab Results  Component Value Date   CHOL 225 (H) 04/05/2022   Lab Results   Component Value Date   HDL 65 04/05/2022   Lab Results  Component Value Date   LDLCALC 148 (H) 04/05/2022   Lab Results  Component Value Date   TRIG 68 04/05/2022   Lab Results  Component Value Date   CHOLHDL 3.5 04/05/2022   Lab Results  Component Value Date   HGBA1C 4.8 04/05/2022       Assessment & Plan:  Acute laryngopharyngitis -     Triamcinolone Acetonide -     Azithromycin; Take 2 tablets on day 1, then 1 tablet daily on days 2 through 5  Dispense: 6 tablet; Refill: 0 -     Promethazine-DM; Take 5 mLs by mouth 4 (four) times daily as needed.  Dispense: 118 mL; Refill: 0     Meds ordered this encounter  Medications   triamcinolone acetonide (KENALOG-40) injection 60 mg   azithromycin (ZITHROMAX) 250 MG tablet    Sig: Take 2 tablets on day 1, then 1 tablet daily on days 2 through 5    Dispense:  6 tablet    Refill:  0    Order Specific Question:   Supervising Provider    Answer:   Shelton Silvas   promethazine-dextromethorphan (PROMETHAZINE-DM) 6.25-15 MG/5ML syrup    Sig: Take 5 mLs by mouth 4 (four) times daily as needed.    Dispense:  118 mL    Refill:  0    Order Specific Question:   Supervising Provider    Answer:   Shelton Silvas    No orders of the defined types were placed in this encounter.    Follow-up: Return if symptoms worsen or fail to improve.  An After Visit Summary was  printed and given to the patient.  Yetta Flock [facility] [phone]

## 2022-06-06 DIAGNOSIS — E559 Vitamin D deficiency, unspecified: Secondary | ICD-10-CM | POA: Insufficient documentation

## 2022-06-06 DIAGNOSIS — F419 Anxiety disorder, unspecified: Secondary | ICD-10-CM | POA: Insufficient documentation

## 2022-06-06 DIAGNOSIS — N189 Chronic kidney disease, unspecified: Secondary | ICD-10-CM | POA: Insufficient documentation

## 2022-06-06 DIAGNOSIS — J302 Other seasonal allergic rhinitis: Secondary | ICD-10-CM | POA: Insufficient documentation

## 2022-06-06 DIAGNOSIS — E785 Hyperlipidemia, unspecified: Secondary | ICD-10-CM | POA: Insufficient documentation

## 2022-06-06 DIAGNOSIS — I10 Essential (primary) hypertension: Secondary | ICD-10-CM | POA: Insufficient documentation

## 2022-06-06 DIAGNOSIS — T8859XA Other complications of anesthesia, initial encounter: Secondary | ICD-10-CM | POA: Insufficient documentation

## 2022-06-06 DIAGNOSIS — K760 Fatty (change of) liver, not elsewhere classified: Secondary | ICD-10-CM | POA: Insufficient documentation

## 2022-06-06 DIAGNOSIS — E119 Type 2 diabetes mellitus without complications: Secondary | ICD-10-CM | POA: Insufficient documentation

## 2022-06-08 ENCOUNTER — Encounter: Payer: Self-pay | Admitting: Cardiology

## 2022-06-08 ENCOUNTER — Ambulatory Visit: Payer: 59 | Attending: Cardiology | Admitting: Cardiology

## 2022-06-08 VITALS — BP 130/74 | HR 87 | Ht 63.0 in | Wt 174.2 lb

## 2022-06-08 DIAGNOSIS — R079 Chest pain, unspecified: Secondary | ICD-10-CM

## 2022-06-08 DIAGNOSIS — E669 Obesity, unspecified: Secondary | ICD-10-CM

## 2022-06-08 DIAGNOSIS — E66811 Obesity, class 1: Secondary | ICD-10-CM

## 2022-06-08 DIAGNOSIS — Z01810 Encounter for preprocedural cardiovascular examination: Secondary | ICD-10-CM

## 2022-06-08 DIAGNOSIS — E782 Mixed hyperlipidemia: Secondary | ICD-10-CM | POA: Diagnosis not present

## 2022-06-08 HISTORY — DX: Obesity, unspecified: E66.9

## 2022-06-08 HISTORY — DX: Chest pain, unspecified: R07.9

## 2022-06-08 HISTORY — DX: Obesity, class 1: E66.811

## 2022-06-08 NOTE — Progress Notes (Signed)
Cardiology Office Note:    Date:  06/08/2022   ID:  Michelle Lamb, DOB [birth year], MRN [id number]  PCP:  Marianne Sofiaavis, Sara, PA-C  Cardiologist:  Garwin Brothersajan R Junette Bernat, MD   Referring MD: Alcide EvenerKennedy-Smith, Colleen *    ASSESSMENT:    1. Chest pain of uncertain etiology   2. Mixed hyperlipidemia   3. Obesity (BMI 30.0-34.9)   4. Mixed dyslipidemia    PLAN:    In order of problems listed above:  Primary prevention stressed with the patient.  Importance of compliance with diet medication stressed and she vocalized understanding. Chest pain: Atypical in nature but in view of risk factors we will do an exercise stress echo and she is agreeable. Mixed dyslipidemia: I reviewed lipids with her at extensive length diet was emphasized and she promises to do better. Obesity: Weight reduction stressed and she promises to do better. Patient will be seen in follow-up appointment in 6 months or earlier if the patient has any concerns.    Medication Adjustments/Labs and Tests Ordered: Current medicines are reviewed at length with the patient today.  Concerns regarding medicines are outlined above.  Orders Placed This Encounter  Procedures   CT CARDIAC SCORING   EKG 12-Lead   ECHOCARDIOGRAM STRESS TEST   No orders of the defined types were placed in this encounter.    History of Present Illness:    Michelle Lamb is a 32 y.o. female who is being seen today for the evaluation of chest pain at the request of Alcide EvenerKennedy-Smith, Colleen *.  Patient is a pleasant 32 year old female.  She has past medical history of mixed dyslipidemia.  She is on weight loss surgery.  She gives a history of chest pain.  She mentions to me that she exercises for about half an hour without any problems but at times when she is just sitting down she has chest pain.  No radiation to the neck or to the arm.  She is concerned about this.  She denies any history of diabetes mellitus or smoking.  At the time of my  evaluation, the patient is alert awake oriented and in no distress.  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  Report date is 02/23/2022.      The CancerNext-Expanded gene panel offered by [facility] and includes sequencing, rearrangement, and RNA analysis for the following 77 genes: AIP, ALK, APC, ATM, AXIN2, BAP1, BARD1, BLM, BMPR1A, BRCA1, BRCA2, BRIP1, CDC73, CDH1, CDK4, CDKN1B, CDKN2A, CHEK2, CTNNA1, DICER1, FANCC, FH, FLCN, GALNT12, KIF1B, LZTR1,   Hepatic steatosis 09/08/2021   History of herpes simplex infection    History of partial nephrectomy 03/21/2022   Formatting of this note might be different from the original. 03/15/2021: Underwent right partial nephrectomy by Dr. Laverle PatterBorden  for right renal neoplasm. Pathology: Angiomyolipoma. Formatting of this note might be different from the original. 03/15/2021: Underwent right partial nephrectomy by Dr. Laverle Patter for right renal neoplasm. Pathology: Angiomyolipoma.   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   Morbid obesity 06/02/2020   Neoplasm of right kidney    Obstructive sleep apnea 10/29/2019   Seasonal allergies    Ureteral obstruction, right 07/15/2021   Vaginal delivery 2009, 2010, 2015   Viral disease 03/02/2022   Vitamin D deficiency    Vitamin D insufficiency 09/30/2020   Weight loss 09/30/2020    Past Surgical History:  Procedure Laterality Date   CESAREAN SECTION  09/29/2016   CHOLECYSTECTOMY     CYSTOSCOPY W/ URETERAL STENT PLACEMENT Right 07/15/2021   Procedure: CYSTOSCOPY WITH RETROGRADE PYELOGRAM/URETERAL STENT PLACEMENT;  Surgeon: Heloise Purpura, MD;  Location: [facility];  Service: Urology;  Laterality: Right;   CYSTOSCOPY WITH RETROGRADE PYELOGRAM, URETEROSCOPY AND STENT PLACEMENT Right 05/03/2021   Procedure: CYSTOSCOPY WITH RIGHT RETROGRADE PYELOGRAM, URETEROSCOPY;  Surgeon: Heloise Purpura, MD;  Location: [facility];  Service: Urology;  Laterality: Right;   DILATION AND CURETTAGE OF UTERUS N/A 12/31/2015   Procedure: SUCTION DILATATION AND CURETTAGE;  Surgeon: [place] Bing, MD;  Location: [facility];  Service: Gynecology;  Laterality: N/A;   DILATION AND EVACUATION N/A 01/01/2016   Procedure: DILATATION AND EVACUATION;  Surgeon: Tilda Burrow, MD;  Location: [facility];  Service: Gynecology;  Laterality: N/A;   ESOPHAGOGASTRODUODENOSCOPY     GASTRIC BYPASS  06/02/2020   IR NEPHRO TUBE REMOV/FL  07/16/2021   IR NEPHROSTOMY EXCHANGE RIGHT  05/12/2021   IR NEPHROSTOMY PLACEMENT RIGHT  05/05/2021   ROBOT ASSISTED PYELOPLASTY Right 07/15/2021   Procedure: XI ROBOTIC ASSISTED  LAPAROSCOPIC PYELOPLASTY;  Surgeon: Heloise Purpura, MD;  Location: [facility];  Service: Urology;  Laterality: Right;   ROBOTIC ASSITED PARTIAL NEPHRECTOMY Right 03/15/2021   Procedure: XI ROBOTIC ASSITED PARTIAL NEPHRECTOMY;  Surgeon: Heloise Purpura, MD;  Location: [facility];  Service: Urology;  Laterality: Right;    TONSILLECTOMY     TUBAL LIGATION      Current Medications: Current Meds  Medication Sig   albuterol (VENTOLIN HFA) 108 (90 Base) MCG/ACT inhaler Inhale 2 puffs into the lungs every 6 (six) hours as needed for wheezing or shortness of breath.   budesonide-formoterol (SYMBICORT) 80-4.5 MCG/ACT inhaler Inhale 2 puffs into the lungs daily as needed (wheezing).   cyanocobalamin (VITAMIN B12) 1000 MCG/ML injection Inject 1 ML as directed once a month   cyclobenzaprine (FLEXERIL) 10 MG tablet Take 1 tablet (10 mg total) by mouth 3 (three) times daily as needed for muscle spasms.   fluticasone (FLONASE) 50 MCG/ACT nasal spray Place 2 sprays into both nostrils daily. (Patient taking differently: Place 2 sprays into both nostrils as needed for allergies or rhinitis.)   lisdexamfetamine (VYVANSE) 50 MG capsule Take 1 capsule (50 mg total) by mouth daily.   LORazepam (ATIVAN) 1 MG tablet Take 1 tablet (1 mg total) by mouth daily as needed.   Norethindrone Acetate-Ethinyl Estrad-FE (JUNEL FE 24) 1-20 MG-MCG(24) tablet Take 1 tablet by mouth daily.   omeprazole (PRILOSEC) 20 MG capsule TAKE 1 CAPSULE TWICE  DAILY (Patient taking differently: Take 20 mg by mouth daily.)   ondansetron (ZOFRAN-ODT) 4 MG disintegrating tablet Take 1 tablet (4 mg total) by mouth every 8 (eight) hours as needed for nausea or vomiting.   Semaglutide,0.25 or 0.5MG /DOS, (OZEMPIC, 0.25 OR 0.5 MG/DOSE,) 2 MG/3ML SOPN Inject 0.5 mg into the skin once a week.   Vitamin D, Ergocalciferol, (DRISDOL) 1.25 MG (50000 UNIT) CAPS capsule Take 1 capsule (50,000 Units total) by mouth 2 (two) times a week.     Allergies:   Patient has no known allergies.   Social History   Socioeconomic History   Marital status: Married    Spouse name: Not on file   Number of children: 9   Years of education: 13   Highest education level: Associate degree: academic program  Occupational History   Occupation: Scientist, forensic  Tobacco Use    Smoking status: Never   Smokeless tobacco: Never  Vaping Use   Vaping Use: Never used  Substance and Sexual Activity   Alcohol use: Yes    Alcohol/week: 1.0 standard drink of alcohol    Types: 1 Standard drinks or equivalent per week    Comment: OCCASIONAL   Drug use: No   Sexual activity: Yes    Partners: Male    Birth control/protection: Surgical    Comment: tubal  Other Topics Concern   Not on file  Social History Narrative   Not on file   Social Determinants of Health   Financial Resource Strain: Not on file  Food Insecurity: Not on file  Transportation Needs: Not on file  Physical Activity: Not on file  Stress: Not on file  Social Connections: Not on file     Family History: The patient's family history includes Breast cancer in her maternal great-grandmother; Cancer in her paternal grandfather and another family member; Cervical cancer in her mother; Depression in her paternal grandmother; Hypertension in her mother; Leukemia in her maternal grandfather; Stomach cancer in her maternal grandmother and another family member; Thyroid disease in her mother.  ROS:   Please see the history of present illness.    All other systems reviewed and are negative.  EKGs/Labs/Other Studies Reviewed:    The following studies were reviewed today: EKG reveals sinus rhythm and nonspecific ST-T changes   Recent Labs: 04/05/2022: ALT 9; BUN 12; Creatinine, Ser 0.64; Hemoglobin 13.6; Platelets 280; Potassium 4.4; Sodium 139; TSH 1.040  Recent Lipid Panel    Component Value Date/Time   CHOL 225 (H) 04/05/2022 0927   TRIG 68 04/05/2022 0927   HDL 65 04/05/2022 0927   CHOLHDL 3.5 04/05/2022 0927   LDLCALC 148 (H) 04/05/2022 0927    Physical Exam:    VS:  BP 130/74   Pulse 87   Ht 5\' 3"  (1.6 m)   Wt 174 lb 3.2 oz (79 kg)   LMP 04/28/2022 (Exact Date)   SpO2 99%   BMI 30.86 kg/m     Wt Readings from Last 3 Encounters:  06/08/22 174 lb 3.2 oz (79 kg)  05/26/22 170 lb 9.6 oz  (77.4 kg)  05/17/22 173 lb (78.5 kg)     GEN: Patient is in no acute distress HEENT: Normal NECK: No JVD; No carotid bruits LYMPHATICS: No lymphadenopathy CARDIAC: S1 S2 regular, 2/6 systolic murmur at the apex. RESPIRATORY:  Clear to auscultation without rales, wheezing or rhonchi  ABDOMEN: Soft, non-tender, non-distended MUSCULOSKELETAL:  No edema; No deformity  SKIN: Warm and dry NEUROLOGIC:  Alert and oriented  x 3 PSYCHIATRIC:  Normal affect    Signed, Garwin Brothers, MD  06/08/2022 11:22 AM    [facility]

## 2022-06-08 NOTE — Addendum Note (Signed)
Addended by: Eleonore Chiquito on: 06/08/2022 11:27 AM   Modules accepted: Orders

## 2022-06-08 NOTE — Patient Instructions (Signed)
Medication Instructions:  Your physician recommends that you continue on your current medications as directed. Please refer to the Current Medication list given to you today.  *If you need a refill on your cardiac medications before your next appointment, please call your pharmacy*   Lab Work: None ordered If you have labs (blood work) drawn today and your tests are completely normal, you will receive your results only by: MyChart Message (if you have MyChart) OR A paper copy in the mail If you have any lab test that is abnormal or we need to change your treatment, we will call you to review the results.   Testing/Procedures:      Stress Echocardiogram Information Sheet                                                      Instructions:    1. You may take your morning medications the morning of the test  2. Do NOT eat a large meal prior to the test.  3. Dress prepared to exercise.  4. DO NOT use ANY caffeine or tobacco products 3 hours before appointment.  5. Please bring all current prescription medications.  We will order CT coronary calcium score. It will cost $99.00 and iis due at time of scan.  Please call to schedule.    [facility] [address] [place], [place] [address] [phone] option 7   Follow-Up: At [facility], you and your health needs are our priority.  As part of our continuing mission to provide you with exceptional heart care, we have created designated Provider Care Teams.  These Care Teams include your primary Cardiologist (physician) and Advanced Practice Providers (APPs -  Physician Assistants and Nurse Practitioners) who all work together to provide you with the care you need, when you need it.  We recommend signing up for the patient portal called "MyChart".  Sign up information is provided on this After Visit Summary.  MyChart is used to connect with patients for Virtual Visits (Telemedicine).  Patients are able to view lab/test  results, encounter notes, upcoming appointments, etc.  Non-urgent messages can be sent to your provider as well.   To learn more about what you can do with MyChart, go to [email or web address].    Your next appointment:   As needed  The format for your next appointment:   In Person  Provider:   Belva Crome, MD   Other Instructions Exercise Stress Echocardiogram An exercise stress echocardiogram is a test to check how well your heart is working. This test uses sound waves and a computer to make pictures of your heart. These pictures will be taken before and after you exercise. For this test, you will walk on a treadmill or ride a bicycle to make your heart beat faster. While you exercise, your heart will be checked with an electrocardiogram (ECG). Your blood pressure will also be checked. You may have this test if: You have chest pain or a heart problem. You had a heart attack or heart surgery not long ago. You have heart valve problems. You have a condition that causes narrowing of the blood vessels that supply your heart. You have a high risk of heart disease and: You are starting a new exercise program. You need to have a big surgery. Tell a doctor about: Any allergies  you have. All medicines you are taking. This includes vitamins, herbs, eye drops, creams, and over-the-counter medicines. Any problems you or family members have had with medicines that make you fall asleep (anesthetic medicines). Any surgeries you have had. Any blood disorders you have. Any medical conditions you have. Whether you are pregnant or may be pregnant. What are the risks? Generally, this is a safe test. However, problems may occur, including: Chest pain. Feeling dizzy or light-headed. Shortness of breath. Increased or irregular heartbeat. Feeling like you may vomit (nausea) or vomiting. Heart attack. This is very rare. What happens before the test? Medicines Ask your doctor about changing  or stopping your normal medicines. This is important if you take diabetes medicines or blood thinners. If you use an inhaler, bring it to the test. General instructions Wear comfortable clothes and walking shoes. Follow instructions from your doctor about what you cannot eat or drink before the test. Do not drink or eat anything that has caffeine in it. Stop having caffeine 24 hours before the test. Do not smoke or use products that contain nicotine or tobacco for 4 hours before the test. If you need help quitting, ask your doctor. What happens during the test?  You will take off your clothes from the waist up and put on a hospital gown. Electrodes or patches will be put on your chest. A blood pressure cuff will be put on your arm. Before you exercise, a computer will make a picture of your heart. To do this: You will lie down and a gel will be put on your chest. A wand will be moved over the gel. Sound waves from the wand will go to the computer to make the picture. Then, you will start to exercise. You may walk on a treadmill or pedal a bicycle. Your blood pressure and heart rhythm will be checked while you exercise. The exercise will get harder or faster. You will exercise until: Your heart reaches a certain level. You are too tired to go on. You cannot go on because of chest pain, weakness, or dizziness. You will lie down right away so another picture of your heart can be taken. The procedure may vary among doctors and hospitals. What can I expect after the test? After your test, it is common to have: Mild soreness. Mild tiredness. Your heart rate and blood pressure will be checked until they return to your normal levels. You should not have any new symptoms after this test. Follow these instructions at home: If your doctor says that you can, you may: Eat what you normally eat. Do your normal activities. Take over-the-counter and prescription medicines only as told by your  doctor. Keep all follow-up visits. It is up to you to get the results of your test. Ask how to get your results when they are ready. Contact a doctor if: You feel dizzy or light-headed. You have a fast or irregular heartbeat. You feel like you may vomit or you vomit. You have a headache. You feel short of breath. Get help right away if: You develop pain or pressure: In your chest. In your jaw or neck. Between your shoulders. That goes down your left arm. You faint. You have trouble breathing. These symptoms may be an emergency. Get medical help right away. Call your local emergency services (911 in the [place] Do not wait to see if the symptoms will go away. Do not drive yourself to the hospital. Summary This is a test that checks how  well your heart is working. Follow instructions about what you cannot eat or drink before the test. Ask your doctor if you should take your normal medicines before the test. Stop having caffeine 24 hours before the test. Do not smoke or use products with nicotine or tobacco in them for 4 hours before the test. During the test, your blood pressure and heart rhythm will be checked while you exercise. This information is not intended to replace advice given to you by your health care provider. Make sure you discuss any questions you have with your health care provider. Document Revised: 10/28/2020 Document Reviewed: 10/08/2019 Elsevier Patient Education  2022 Elsevier Inc.   Coronary Calcium Scan A coronary calcium scan is an imaging test used to look for deposits of plaque in the inner lining of the blood vessels of the heart (coronary arteries). Plaque is made up of calcium, protein, and fatty substances. These deposits of plaque can partly clog and narrow the coronary arteries without producing any symptoms or warning signs. This puts a person at risk for a heart attack. A coronary calcium scan is performed using a computed tomography (CT) scanner  machine without using a dye (contrast). This test is recommended for people who are at moderate risk for heart disease. The test can find plaque deposits before symptoms develop. Tell a health care provider about: Any allergies you have. All medicines you are taking, including vitamins, herbs, eye drops, creams, and over-the-counter medicines. Any problems you or family members have had with anesthetic medicines. Any bleeding problems you have. Any surgeries you have had. Any medical conditions you have. Whether you are pregnant or may be pregnant. What are the risks? Generally, this is a safe procedure. However, problems may occur, including: Harm to a pregnant woman and her unborn baby. This test involves the use of radiation. Radiation exposure can be dangerous to a pregnant woman and her unborn baby. If you are pregnant or think you may be pregnant, you should not have this procedure done. A slight increase in the risk of cancer. This is because of the radiation involved in the test. The amount of radiation from one test is similar to the amount of radiation you are naturally exposed to over one year. What happens before the procedure? Ask your health care provider for any specific instructions on how to prepare for this procedure. You may be asked to avoid products that contain caffeine, tobacco, or nicotine for 4 hours before the procedure. What happens during the procedure?  You will undress and remove any jewelry from your neck or chest. You may need to remove hearing aides and dentures. Women may need to remove their bras. You will put on a hospital gown. Sticky electrodes will be placed on your chest. The electrodes will be connected to an electrocardiogram (ECG) machine to record a tracing of the electrical activity of your heart. You will lie down on your back on a curved bed that is attached to the CT scanner. You may be given medicine to slow down your heart rate so that clear  pictures can be created. You will be moved into the CT scanner, and the CT scanner will take pictures of your heart. During this time, you will be asked to lie still and hold your breath for 10-20 seconds at a time while each picture of your heart is being taken. The procedure may vary among health care providers and hospitals. What can I expect after the procedure? You can return  to your normal activities. It is up to you to get the results of your procedure. Ask your health care provider, or the department that is doing the procedure, when your results will be ready. Summary A coronary calcium scan is an imaging test used to look for deposits of plaque in the inner lining of the blood vessels of the heart. Plaque is made up of calcium, protein, and fatty substances. A coronary calcium scan is performed using a CT scanner machine without contrast. Generally, this is a safe procedure. Tell your health care provider if you are pregnant or may be pregnant. Ask your health care provider for any specific instructions on how to prepare for this procedure. You can return to your normal activities after the scan is done. This information is not intended to replace advice given to you by your health care provider. Make sure you discuss any questions you have with your health care provider. Document Revised: 01/24/2021 Document Reviewed: 01/24/2021 Elsevier Patient Education  2023 [employer].

## 2022-06-09 ENCOUNTER — Ambulatory Visit: Payer: 59 | Admitting: Physician Assistant

## 2022-06-09 ENCOUNTER — Ambulatory Visit: Payer: 59 | Attending: Cardiology

## 2022-06-09 DIAGNOSIS — R079 Chest pain, unspecified: Secondary | ICD-10-CM | POA: Diagnosis not present

## 2022-06-09 DIAGNOSIS — Z01810 Encounter for preprocedural cardiovascular examination: Secondary | ICD-10-CM

## 2022-06-09 LAB — EXERCISE TOLERANCE TEST
Angina Index: 1
Base ST Depression (mm): 0 mm
Estimated workload: 8.5
Exercise duration (min): 6 min
Exercise duration (sec): 31 s
MPHR: 187 {beats}/min
Peak HR: 160 {beats}/min
Percent HR: 85 %
RPE: 17
Rest HR: 78 {beats}/min

## 2022-06-10 ENCOUNTER — Telehealth: Payer: Self-pay

## 2022-06-10 DIAGNOSIS — R079 Chest pain, unspecified: Secondary | ICD-10-CM

## 2022-06-10 NOTE — Telephone Encounter (Signed)
Spoke with Michelle Lamb and advised that Dr. Tomie China recommends she have a cardiolite stress test. Will send instructions via MyChart.

## 2022-06-10 NOTE — Telephone Encounter (Signed)
-----   Message from Garwin Brothers, MD sent at 06/09/2022  3:57 PM EDT ----- Will be sending this to you.  Please see below message. ----- Message ----- From: Garwin Brothers, MD Sent: 06/09/2022   2:42 PM EDT To: Garwin Brothers, MD  Let us set up the patient for exercise stress Cardiolite.  She will need a pregnancy test of course.  Chest pain that she had on the treadmill was concerning. ----- Message ----- From: Garwin Brothers, MD Sent: 06/09/2022   2:32 PM EDT To: Garwin Brothers, MD

## 2022-06-13 ENCOUNTER — Telehealth: Payer: Self-pay | Admitting: Nurse Practitioner

## 2022-06-13 NOTE — Telephone Encounter (Signed)
The pt has been advised that we will contact her as soon as cardiac work up is complete to reschedule as needed.

## 2022-06-13 NOTE — Telephone Encounter (Signed)
PT is calling to get clarity on whether she can reschedule once her CTA/stress test is complete or once the results are reviewed. Please advise.

## 2022-06-13 NOTE — Telephone Encounter (Signed)
The pt has been advised and appts cancelled. The pt aware that we will contact her as soon as able to reschedule after cardiac work is complete

## 2022-06-13 NOTE — Telephone Encounter (Signed)
Good morning, nursing staff covering for Michelle Lamb, please contact patient and let her know we need to cancel her EGD/colonoscopy scheduled with Dr. Barron Alvine on 06/17/2022 as cardiac clearance not yet obtained. Patient was seen by cardiologist Dr. Tomie China 06/08/2022 and he ordered a cardiolite stress test which has not yet been scheduled. Pls cancel her EGD/colonoscopy 4/19 and let her know we will reschedule these procedures once we have cardiac clearance. THX  Dr. Barron Alvine, [place]ain Childes

## 2022-06-14 ENCOUNTER — Encounter: Payer: Self-pay | Admitting: *Deleted

## 2022-06-14 DIAGNOSIS — Z136 Encounter for screening for cardiovascular disorders: Secondary | ICD-10-CM | POA: Diagnosis not present

## 2022-06-15 ENCOUNTER — Ambulatory Visit: Payer: 59 | Attending: Cardiology

## 2022-06-15 DIAGNOSIS — R079 Chest pain, unspecified: Secondary | ICD-10-CM

## 2022-06-15 LAB — MYOCARDIAL PERFUSION IMAGING
Peak HR: 103 {beats}/min
Rest HR: 63 {beats}/min
Rest Nuclear Isotope Dose: 10.1 mCi
TID: 1.05

## 2022-06-15 MED ORDER — TECHNETIUM TC 99M TETROFOSMIN IV KIT
10.1000 | PACK | Freq: Once | INTRAVENOUS | Status: AC | PRN
  Administered 2022-06-15: 10.1 via INTRAVENOUS

## 2022-06-15 MED ORDER — TECHNETIUM TC 99M TETROFOSMIN IV KIT
32.1000 | PACK | Freq: Once | INTRAVENOUS | Status: AC | PRN
  Administered 2022-06-15: 32.1 via INTRAVENOUS

## 2022-06-15 MED ORDER — REGADENOSON 0.4 MG/5ML IV SOLN
0.4000 mg | Freq: Once | INTRAVENOUS | Status: AC
Start: 2022-06-15 — End: 2022-06-15
  Administered 2022-06-15: 0.4 mg via INTRAVENOUS

## 2022-06-16 ENCOUNTER — Other Ambulatory Visit: Payer: Self-pay

## 2022-06-16 ENCOUNTER — Encounter: Payer: Self-pay | Admitting: Hematology and Oncology

## 2022-06-16 LAB — MYOCARDIAL PERFUSION IMAGING
LV dias vol: 89 mL (ref 46–106)
LV sys vol: 36 mL
Nuc Stress EF: 59 %
SDS: 0
SRS: 1
SSS: 1
ST Depression (mm): 0 mm
Stress Nuclear Isotope Dose: 32.1 mCi

## 2022-06-17 ENCOUNTER — Encounter: Payer: 59 | Admitting: Gastroenterology

## 2022-06-21 ENCOUNTER — Telehealth: Payer: Self-pay

## 2022-06-21 NOTE — Telephone Encounter (Signed)
It is recommended that this pt have cardiac test completed prior to scheduling pt for GI procedures.

## 2022-06-21 NOTE — Telephone Encounter (Signed)
I will route to Colleen's nurse Viviann Spare. I was just covering for him while he was away.

## 2022-06-21 NOTE — Telephone Encounter (Signed)
Attempted to call pt to clarify when she would be getting her cardiac imaging/studies complete. Pt has a PV on 07/21/22. Pt will need to have all studies complete at this time or clearance from her cardiologist before we can proceed otherwise we would have to cancel her at this time. Left VM making pt aware and for her to call us back.

## 2022-06-21 NOTE — Telephone Encounter (Signed)
Good morning Patty,   I am reaching out because this pt called and r/s her procedure PV on 4/30 and ENDO/COLON on 5/24. I see a TE from you stating once her cardiac work up was complete someone would call to have her r/s. It looks like the pt had a exercise tolerance test. Has a CT cardiac scoring scheduled for 06/24/22 and then a stress test that has been ordered but not complete. Please clarify if this pt is ok to proceed with her PV or are you all waiting clearance from a physician still. Thank you.

## 2022-06-22 NOTE — Telephone Encounter (Signed)
2nd attempt to reach pt. VM left to call back

## 2022-06-22 NOTE — Telephone Encounter (Signed)
Patient returned call

## 2022-06-22 NOTE — Telephone Encounter (Signed)
Spoke with the patient. She has completed all her testing. Per pt things may be still pending on our end because she had them done outside of [facility] but everything came back normal. Viviann Spare can you please reach out to her cardiologist for clearance. The pt said he was to review it her PV is on 4/30. Thank you

## 2022-06-23 ENCOUNTER — Other Ambulatory Visit (HOSPITAL_COMMUNITY): Payer: Self-pay

## 2022-06-23 ENCOUNTER — Other Ambulatory Visit: Payer: Self-pay

## 2022-06-23 DIAGNOSIS — D513 Other dietary vitamin B12 deficiency anemia: Secondary | ICD-10-CM

## 2022-06-23 DIAGNOSIS — F418 Other specified anxiety disorders: Secondary | ICD-10-CM

## 2022-06-23 MED ORDER — LORAZEPAM 1 MG PO TABS
1.0000 mg | ORAL_TABLET | Freq: Every day | ORAL | 1 refills | Status: DC | PRN
Start: 2022-06-23 — End: 2022-08-02
  Filled 2022-06-23: qty 30, 30d supply, fill #0

## 2022-06-23 MED ORDER — OZEMPIC (0.25 OR 0.5 MG/DOSE) 2 MG/3ML ~~LOC~~ SOPN
0.5000 mg | PEN_INJECTOR | SUBCUTANEOUS | 3 refills | Status: DC
  Filled 2022-06-23: qty 3, 28d supply, fill #0
  Filled 2022-09-07: qty 3, 28d supply, fill #1

## 2022-06-23 MED ORDER — LISDEXAMFETAMINE DIMESYLATE 50 MG PO CAPS
50.0000 mg | ORAL_CAPSULE | Freq: Every day | ORAL | 0 refills | Status: DC
  Filled 2022-06-23: qty 30, 30d supply, fill #0

## 2022-06-23 MED ORDER — CYANOCOBALAMIN 1000 MCG/ML IJ SOLN
INTRAMUSCULAR | 2 refills | Status: DC
Start: 2022-06-23 — End: 2023-02-28
  Filled 2022-06-23: qty 1, 30d supply, fill #0
  Filled 2022-09-07 (×2): qty 1, 30d supply, fill #1
  Filled 2022-11-14 (×2): qty 1, 30d supply, fill #2

## 2022-06-24 ENCOUNTER — Ambulatory Visit (HOSPITAL_BASED_OUTPATIENT_CLINIC_OR_DEPARTMENT_OTHER): Payer: 59

## 2022-06-24 NOTE — Telephone Encounter (Signed)
[facility] Pre-operative Risk Assessment     Request for surgical clearance:     Endoscopy Procedure  What type of surgery is being performed?     Endo colon  When is this surgery scheduled?     07/22/22  What type of clearance is required ?   Cardiac  Are there any medications that need to be held prior to surgery and how long?   Practice name and name of physician performing surgery?      [facility]  What is your office phone and fax number?      Phone- [phone]  Fax- [phone]  Anesthesia type (None, local, MAC, general) ?       MAC

## 2022-06-25 ENCOUNTER — Other Ambulatory Visit (HOSPITAL_COMMUNITY): Payer: Self-pay

## 2022-06-27 ENCOUNTER — Encounter: Payer: Self-pay | Admitting: Cardiology

## 2022-06-28 ENCOUNTER — Other Ambulatory Visit: Payer: 59

## 2022-06-28 ENCOUNTER — Ambulatory Visit (AMBULATORY_SURGERY_CENTER): Payer: 59

## 2022-06-28 ENCOUNTER — Other Ambulatory Visit (HOSPITAL_COMMUNITY): Payer: Self-pay

## 2022-06-28 VITALS — Ht 63.0 in | Wt 177.0 lb

## 2022-06-28 DIAGNOSIS — R1013 Epigastric pain: Secondary | ICD-10-CM

## 2022-06-28 DIAGNOSIS — K219 Gastro-esophageal reflux disease without esophagitis: Secondary | ICD-10-CM

## 2022-06-28 DIAGNOSIS — K5900 Constipation, unspecified: Secondary | ICD-10-CM | POA: Insufficient documentation

## 2022-06-28 DIAGNOSIS — K625 Hemorrhage of anus and rectum: Secondary | ICD-10-CM

## 2022-06-28 DIAGNOSIS — R1030 Lower abdominal pain, unspecified: Secondary | ICD-10-CM

## 2022-06-28 HISTORY — DX: Constipation, unspecified: K59.00

## 2022-06-28 NOTE — Progress Notes (Signed)
No egg or soy allergy known to patient   No issues known to pt with past sedation with any surgeries or procedures  Patient denies ever being told they had issues or difficulty with intubation   No FH of Malignant Hyperthermia  Pt is not on diet pills  Pt is not on  home 02   Pt is not on blood thinners   Pt HAS issues with constipation , FEELS STOOL IS STOOL IS STUCK IN RECTAL AREA AND NOT EVACUATING state she is also not feeling the urge to urinate and can hold urine for a long time, states she was prescribed a medication to relax her bladder, however, she was incontinent and refuses to use the medication.   Instruct pt to take miralax starting today, once or twice daily until the day of the procedure.  No A fib or A flutter  Have any cardiac testing pending--had an echocardiogram stress test last week, pt states she does not need any further testing,   We are waiting on cardiac clearance,  TE in epic, reminder sent  Pt instructed to use Single[employer] or GoodRx for a price reduction on prep    Virtual previsit

## 2022-06-29 NOTE — Telephone Encounter (Signed)
   Patient Name: Michelle Lamb  DOB: 1990-08-10 MRN: [id number]  Primary Cardiologist: None  Chart reviewed as part of pre-operative protocol coverage. Given past medical history and time since last visit, based on ACC/AHA guidelines, [name] is at acceptable risk for the planned procedure without further cardiovascular testing.  Per Dr. Tomie China patient is at low risk for scheduled surgical procedure.  He recommends meticulous hemodynamic monitoring through the perioperative and postprocedure.  The patient was advised that if she develops new symptoms prior to surgery to contact our office to arrange for a follow-up visit, and she verbalized understanding.  I will route this recommendation to the requesting party via Epic fax function and remove from pre-op pool.  Please call with questions.  Napoleon Form, Leodis Rains, NP 06/29/2022, 1:25 PM

## 2022-06-29 NOTE — Telephone Encounter (Signed)
Contacted pt & LVM to return call 

## 2022-06-30 ENCOUNTER — Telehealth: Payer: Self-pay | Admitting: Nurse Practitioner

## 2022-06-30 NOTE — Telephone Encounter (Signed)
DD, pls inform patient we received cardiac clearance therefore she will proceed with EGD/colonoscopy 07/22/2022 as planned. Please instruct patient to hold Ozempic  (if she is still taking it) for one week prior to her procedure date. THX.

## 2022-06-30 NOTE — Telephone Encounter (Signed)
Refer to phone note 06/30/22. NP aware of cardiac clearance & CMA has communicated with patient.

## 2022-06-30 NOTE — Telephone Encounter (Signed)
Contacted pt & pt stated that she had a nurse visit in our office yesterday and was informed to hold Ozempic. Patient also stated that she has not taken Ozempic in a few weeks because she feels the Ozempic is causing constipation.

## 2022-07-04 ENCOUNTER — Ambulatory Visit (INDEPENDENT_AMBULATORY_CARE_PROVIDER_SITE_OTHER): Payer: 59

## 2022-07-04 ENCOUNTER — Other Ambulatory Visit: Payer: Self-pay | Admitting: Physician Assistant

## 2022-07-04 DIAGNOSIS — N3001 Acute cystitis with hematuria: Secondary | ICD-10-CM | POA: Diagnosis not present

## 2022-07-04 LAB — POCT URINALYSIS DIP (CLINITEK)
Glucose, UA: NEGATIVE mg/dL
Nitrite, UA: NEGATIVE
POC PROTEIN,UA: 30 — AB
Spec Grav, UA: 1.025 (ref 1.010–1.025)
Urobilinogen, UA: 0.2 E.U./dL
pH, UA: 6 (ref 5.0–8.0)

## 2022-07-04 NOTE — Progress Notes (Signed)
Patient came in for UA, lower back pain. Patient stated that her  Menstrual is currently on.

## 2022-07-06 LAB — URINE CULTURE

## 2022-07-18 ENCOUNTER — Encounter: Payer: Self-pay | Admitting: Cardiology

## 2022-07-22 ENCOUNTER — Encounter: Payer: Self-pay | Admitting: Gastroenterology

## 2022-07-22 ENCOUNTER — Ambulatory Visit (AMBULATORY_SURGERY_CENTER): Payer: 59 | Admitting: Gastroenterology

## 2022-07-22 VITALS — BP 112/70 | HR 56 | Temp 97.1°F | Resp 9 | Ht 63.0 in | Wt 177.0 lb

## 2022-07-22 DIAGNOSIS — R194 Change in bowel habit: Secondary | ICD-10-CM

## 2022-07-22 DIAGNOSIS — R1013 Epigastric pain: Secondary | ICD-10-CM

## 2022-07-22 DIAGNOSIS — K760 Fatty (change of) liver, not elsewhere classified: Secondary | ICD-10-CM | POA: Diagnosis not present

## 2022-07-22 DIAGNOSIS — K259 Gastric ulcer, unspecified as acute or chronic, without hemorrhage or perforation: Secondary | ICD-10-CM | POA: Diagnosis not present

## 2022-07-22 DIAGNOSIS — K219 Gastro-esophageal reflux disease without esophagitis: Secondary | ICD-10-CM

## 2022-07-22 DIAGNOSIS — K5900 Constipation, unspecified: Secondary | ICD-10-CM

## 2022-07-22 DIAGNOSIS — R0789 Other chest pain: Secondary | ICD-10-CM

## 2022-07-22 DIAGNOSIS — K625 Hemorrhage of anus and rectum: Secondary | ICD-10-CM | POA: Diagnosis not present

## 2022-07-22 DIAGNOSIS — J45909 Unspecified asthma, uncomplicated: Secondary | ICD-10-CM | POA: Diagnosis not present

## 2022-07-22 DIAGNOSIS — R1030 Lower abdominal pain, unspecified: Secondary | ICD-10-CM

## 2022-07-22 DIAGNOSIS — R1084 Generalized abdominal pain: Secondary | ICD-10-CM

## 2022-07-22 DIAGNOSIS — F418 Other specified anxiety disorders: Secondary | ICD-10-CM | POA: Diagnosis not present

## 2022-07-22 HISTORY — PX: COLONOSCOPY WITH ESOPHAGOGASTRODUODENOSCOPY (EGD): SHX5779

## 2022-07-22 MED ORDER — NA SULFATE-K SULFATE-MG SULF 17.5-3.13-1.6 GM/177ML PO SOLN: 1.0000 | Freq: Once | ORAL | 0 refills | Status: AC

## 2022-07-22 MED ORDER — SODIUM CHLORIDE 0.9 % IV SOLN: 500.0000 mL | Freq: Once | INTRAVENOUS | Status: DC

## 2022-07-22 NOTE — Progress Notes (Signed)
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  The nature of the procedure, as well as the risks, benefits, and alternatives were carefully and thoroughly reviewed with the patient. Ample time for discussion and questions allowed. The patient understood, was satisfied, and agreed to proceed.     HPI: Michelle Lamb is a 32 y.o. 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  Was last seen in the GI clinic on 04/22/2022.  Has since been cleared by her Cardiologist to proceed with procedures today.  Holding Ozempic for procedure today.  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  Report date is 02/23/2022.      The CancerNext-Expanded gene panel offered by [facility] and includes sequencing, rearrangement, and RNA analysis for the following 77 genes: AIP, ALK, APC, ATM, AXIN2, BAP1, BARD1, BLM, BMPR1A, BRCA1, BRCA2, BRIP1, CDC73, CDH1, CDK4, CDKN1B, CDKN2A, CHEK2, CTNNA1, DICER1, FANCC, FH, FLCN, GALNT12, KIF1B, LZTR1,   Hepatic steatosis 09/08/2021   History of herpes simplex infection    History of partial nephrectomy 03/21/2022   Formatting of this note might be different from the original. 03/15/2021: Underwent right partial nephrectomy by Dr. Laverle Patter for right renal neoplasm. Pathology: Angiomyolipoma. Formatting of this note might be different from the original. 03/15/2021: Underwent right partial nephrectomy by Dr. Laverle Patter for right renal neoplasm. Pathology: Angiomyolipoma.   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   Morbid obesity (HCC) 06/02/2020   Neoplasm of right kidney    benign tumor of kidney   Obstructive sleep apnea 10/29/2019   Seasonal allergies    Sleep apnea    Ureteral obstruction, right 07/15/2021   Vaginal delivery  2009, 2010, 2015   Viral disease 03/02/2022   Vitamin D deficiency    Vitamin D insufficiency 09/30/2020   Weight loss 09/30/2020    Past Surgical History:  Procedure Laterality Date   CESAREAN SECTION  09/29/2016   CHOLECYSTECTOMY     COLONOSCOPY WITH  ESOPHAGOGASTRODUODENOSCOPY (EGD)  07/22/2022   CYSTOSCOPY W/ URETERAL STENT PLACEMENT Right 07/15/2021   Procedure: CYSTOSCOPY WITH RETROGRADE PYELOGRAM/URETERAL STENT PLACEMENT;  Surgeon: Heloise Purpura, MD;  Location: [facility];  Service: Urology;  Laterality: Right;   CYSTOSCOPY WITH RETROGRADE PYELOGRAM, URETEROSCOPY AND STENT PLACEMENT Right 05/03/2021   Procedure: CYSTOSCOPY WITH RIGHT RETROGRADE PYELOGRAM, URETEROSCOPY;  Surgeon: Heloise Purpura, MD;  Location: [facility];  Service: Urology;  Laterality: Right;   DILATION AND CURETTAGE OF UTERUS N/A 12/31/2015   Procedure: SUCTION DILATATION AND CURETTAGE;  Surgeon: Booker Bing, MD;  Location: [facility];  Service: Gynecology;  Laterality: N/A;   DILATION AND EVACUATION N/A 01/01/2016   Procedure: DILATATION AND EVACUATION;  Surgeon: Tilda Burrow, MD;  Location: [facility];  Service: Gynecology;  Laterality: N/A;   ESOPHAGOGASTRODUODENOSCOPY     GASTRIC BYPASS  06/02/2020   IR NEPHRO TUBE REMOV/FL  07/16/2021   IR NEPHROSTOMY EXCHANGE RIGHT  05/12/2021   IR NEPHROSTOMY PLACEMENT RIGHT  05/05/2021   ROBOT ASSISTED PYELOPLASTY Right 07/15/2021   Procedure: XI ROBOTIC ASSISTED  LAPAROSCOPIC PYELOPLASTY;  Surgeon: Heloise Purpura, MD;  Location: [facility];  Service: Urology;  Laterality: Right;   ROBOTIC ASSITED PARTIAL NEPHRECTOMY Right 03/15/2021   Procedure: XI ROBOTIC ASSITED PARTIAL NEPHRECTOMY;  Surgeon: Heloise Purpura, MD;  Location: [facility];  Service: Urology;  Laterality: Right;   TONSILLECTOMY     TUBAL LIGATION     UPPER GASTROINTESTINAL ENDOSCOPY      Prior to Admission medications   Medication Sig Start Date End Date Taking? Authorizing Provider  lisdexamfetamine (VYVANSE) 50 MG capsule Take 1 capsule (50 mg total) by mouth daily. 06/23/22  Yes Marianne Sofia, PA-C  LORazepam (ATIVAN) 1 MG tablet Take 1 tablet (1 mg total) by mouth daily as needed. 06/23/22  Yes Marianne Sofia, PA-C  Norethindrone Acetate-Ethinyl Estrad-FE (JUNEL FE 24) 1-20  MG-MCG(24) tablet Take 1 tablet by mouth daily. 01/06/22  Yes Chrzanowski, Jami B, NP  omeprazole (PRILOSEC) 20 MG capsule TAKE 1 CAPSULE TWICE DAILY Patient taking differently: Take 20 mg by mouth daily. 10/19/21  Yes Janie Morning, NP  ondansetron (ZOFRAN-ODT) 4 MG disintegrating tablet Take 1 tablet (4 mg total) by mouth every 8 (eight) hours as needed for nausea or vomiting. 12/17/21  Yes Marianne Sofia, PA-C  polyethylene glycol (MIRALAX / GLYCOLAX) 17 g packet Take 17 g by mouth 2 (two) times daily.   Yes [provider]  Vitamin D, Ergocalciferol, (DRISDOL) 1.25 MG (50000 UNIT) CAPS capsule Take 1 capsule (50,000 Units total) by mouth 2 (two) times a week. 04/07/22  Yes Marianne Sofia, PA-C  albuterol (VENTOLIN HFA) 108 (90 Base) MCG/ACT inhaler Inhale 2 puffs into the lungs every 6 (six) hours as needed for wheezing or shortness of breath. 09/08/21   Marianne Sofia, PA-C  budesonide-formoterol [facility]) 80-4.5 MCG/ACT inhaler Inhale 2 puffs into the lungs daily as needed (wheezing). 06/05/19   [provider]  cyanocobalamin (VITAMIN B12) 1000 MCG/ML injection Inject 1 ML as directed once a month 06/23/22   Marianne Sofia, PA-C  cyclobenzaprine (FLEXERIL) 10 MG tablet Take 1 tablet (10 mg total) by mouth 3 (three) times daily as needed for muscle spasms. 04/19/22   Earlene Plater,  Huntley Dec, PA-C  fluticasone (FLONASE) 50 MCG/ACT nasal spray Place 2 sprays into both nostrils daily. Patient taking differently: Place 2 sprays into both nostrils as needed for allergies or rhinitis. 03/21/22   Janie Morning, NP  hyoscyamine (LEVSIN SL) 0.125 MG SL tablet Place 1 tablet (0.125 mg total) under the tongue every 6 (six) hours as needed. Patient not taking: Reported on 06/08/2022 04/22/22   Arnaldo Natal, NP  Semaglutide,0.25 or 0.5MG /DOS, (OZEMPIC, 0.25 OR 0.5 MG/DOSE,) 2 MG/3ML SOPN Inject 0.5 mg into the skin once a week. 06/23/22   Marianne Sofia, PA-C    Current Outpatient Medications  Medication  Sig Dispense Refill   lisdexamfetamine (VYVANSE) 50 MG capsule Take 1 capsule (50 mg total) by mouth daily. 30 capsule 0   LORazepam (ATIVAN) 1 MG tablet Take 1 tablet (1 mg total) by mouth daily as needed. 30 tablet 1   Norethindrone Acetate-Ethinyl Estrad-FE (JUNEL FE 24) 1-20 MG-MCG(24) tablet Take 1 tablet by mouth daily. 84 tablet 2   omeprazole (PRILOSEC) 20 MG capsule TAKE 1 CAPSULE TWICE DAILY (Patient taking differently: Take 20 mg by mouth daily.) 180 capsule 1   ondansetron (ZOFRAN-ODT) 4 MG disintegrating tablet Take 1 tablet (4 mg total) by mouth every 8 (eight) hours as needed for nausea or vomiting. 90 tablet 1   polyethylene glycol (MIRALAX / GLYCOLAX) 17 g packet Take 17 g by mouth 2 (two) times daily.     Vitamin D, Ergocalciferol, (DRISDOL) 1.25 MG (50000 UNIT) CAPS capsule Take 1 capsule (50,000 Units total) by mouth 2 (two) times a week. 24 capsule 1   albuterol (VENTOLIN HFA) 108 (90 Base) MCG/ACT inhaler Inhale 2 puffs into the lungs every 6 (six) hours as needed for wheezing or shortness of breath. 1 each 5   budesonide-formoterol (SYMBICORT) 80-4.5 MCG/ACT inhaler Inhale 2 puffs into the lungs daily as needed (wheezing).     cyanocobalamin (VITAMIN B12) 1000 MCG/ML injection Inject 1 ML as directed once a month 1 mL 2   cyclobenzaprine (FLEXERIL) 10 MG tablet Take 1 tablet (10 mg total) by mouth 3 (three) times daily as needed for muscle spasms. 30 tablet 0   fluticasone (FLONASE) 50 MCG/ACT nasal spray Place 2 sprays into both nostrils daily. (Patient taking differently: Place 2 sprays into both nostrils as needed for allergies or rhinitis.) 16 g 6   hyoscyamine (LEVSIN SL) 0.125 MG SL tablet Place 1 tablet (0.125 mg total) under the tongue every 6 (six) hours as needed. (Patient not taking: Reported on 06/08/2022) 30 tablet 1   Semaglutide,0.25 or 0.5MG /DOS, (OZEMPIC, 0.25 OR 0.5 MG/DOSE,) 2 MG/3ML SOPN Inject 0.5 mg into the skin once a week. 3 mL 3   Current  Facility-Administered Medications  Medication Dose Route Frequency Provider Last Rate Last Admin   0.9 %  sodium chloride infusion  500 mL Intravenous Once Lina Hitch V, DO        Allergies as of 07/22/2022   (No Known Allergies)    Family History  Problem Relation Age of Onset   Hypertension Mother    Thyroid disease Mother    Cervical cancer Mother        dx 50s   Stomach cancer Maternal Grandmother        dx 32s   Leukemia Maternal Grandfather        d. 78s   Depression Paternal Grandmother    Cancer Paternal Grandfather        dx after 50; mets  Breast cancer Maternal Great-grandmother        dx <50; mets; MGF's mother   Stomach cancer Other        MGM's brother; dx after 84   Cancer Other        MGF's sisters x3; unknown primary   Colon cancer Neg Hx    Colon polyps Neg Hx    Esophageal cancer Neg Hx    Rectal cancer Neg Hx     Social History   Socioeconomic History   Marital status: Married    Spouse name: Not on file   Number of children: 9   Years of education: 13   Highest education level: Associate degree: academic program  Occupational History   Occupation: Scientist, forensic  Tobacco Use   Smoking status: Never   Smokeless tobacco: Never  Vaping Use   Vaping Use: Never used  Substance and Sexual Activity   Alcohol use: Yes    Alcohol/week: 1.0 standard drink of alcohol    Types: 1 Standard drinks or equivalent per week    Comment: OCCASIONAL, RARELY ONCE MONTH OR TWO   Drug use: Never   Sexual activity: Yes    Partners: Male    Birth control/protection: Surgical    Comment: tubal  Other Topics Concern   Not on file  Social History Narrative   Not on file   Social Determinants of Health   Financial Resource Strain: Not on file  Food Insecurity: Not on file  Transportation Needs: Not on file  Physical Activity: Not on file  Stress: Not on file  Social Connections: Not on file  Intimate Partner Violence: Not on file     Physical Exam: Vital signs in last 24 hours: @BP  120/68   Pulse 60   Temp (!) 97.1 F (36.2 C) (Temporal)   Ht 5\' 3"  (1.6 m)   Wt 177 lb (80.3 kg)   LMP 06/04/2022 Comment: tubal ligation  SpO2 100%   BMI 31.35 kg/m  GEN: NAD EYE: Sclerae anicteric ENT: MMM CV: Non-tachycardic Pulm: CTA b/l GI: Soft, NT/ND NEURO:  Alert & Oriented x 3   Doristine Locks, DO [facility]   07/22/2022 3:07 PM

## 2022-07-22 NOTE — Op Note (Signed)
[facility] Patient Name: Michelle Lamb Procedure Date: 07/22/2022 3:06 PM MRN: [id number] Endoscopist: Doristine Locks , MD, [id number] Age: 32 Referring MD:  Date of Birth: 1990-11-23 Gender: Female Account #: [id number] Procedure:                Upper GI endoscopy Indications:              Generalized abdominal pain, Suspected esophageal                            reflux, Nausea Medicines:                Monitored Anesthesia Care Procedure:                Pre-Anesthesia Assessment:                           - Prior to the procedure, a History and Physical                            was performed, and patient medications and                            allergies were reviewed. The patient's tolerance of                            previous anesthesia was also reviewed. The risks                            and benefits of the procedure and the sedation                            options and risks were discussed with the patient.                            All questions were answered, and informed consent                            was obtained. Prior Anticoagulants: The patient has                            taken no anticoagulant or antiplatelet agents. ASA                            Grade Assessment: II - A patient with mild systemic                            disease. After reviewing the risks and benefits,                            the patient was deemed in satisfactory condition to                            undergo the procedure.  After obtaining informed consent, the endoscope was                            passed under direct vision. Throughout the                            procedure, the patient's blood pressure, pulse, and                            oxygen saturations were monitored continuously. The                            GIF [id number] #[id number] was introduced through the                            mouth, and advanced to the jejunum. The  upper GI                            endoscopy was accomplished without difficulty. The                            patient tolerated the procedure well. Scope In: Scope Out: Findings:                 The examined esophagus was normal.                           The Z-line was regular and was found 36 cm from the                            incisors.                           Evidence of a Roux-en-Y gastrojejunostomy was                            found. The gastrojejunal anastomosis was                            characterized by healthy appearing mucosa. This was                            traversed. The pouch-to-jejunum limb was                            characterized by an intact staple. This was removed                            with cold forceps (not submitted for pathology).                           One non-bleeding superficial gastric ulcer was                            found in the gastric body. Biopsies were taken  with                            a cold forceps for histology. Estimated blood loss                            was minimal. The remainder of the gastric pouch was                            normal appearing.                           The examined jejunum was normal. This was biopsied                            with a cold forceps for histology. Estimated blood                            loss was minimal. Complications:            No immediate complications. Estimated Blood Loss:     Estimated blood loss was minimal. Impression:               - Normal esophagus.                           - Z-line regular, 36 cm from the incisors.                           - Roux-en-Y gastrojejunostomy with gastrojejunal                            anastomosis characterized by healthy appearing                            mucosa. Single surgical staple was seen and removed.                           - Non-bleeding gastric ulcer. Biopsied.                           - Normal examined  jejunum. Biopsied. Recommendation:           - Patient has a contact number available for                            emergencies. The signs and symptoms of potential                            delayed complications were discussed with the                            patient. Return to normal activities tomorrow.                            Written discharge instructions were provided to the  patient.                           - Resume previous diet.                           - Continue present medications.                           - Await pathology results. Doristine Locks, MD 07/22/2022 3:45:17 PM

## 2022-07-22 NOTE — Patient Instructions (Addendum)
Please see scheduled procedure--instructions provided.  YOU HAD AN ENDOSCOPIC PROCEDURE TODAY AT THE [facility]:   Refer to the procedure report that was given to you for any specific questions about what was found during the examination.  If the procedure report does not answer your questions, please call your gastroenterologist to clarify.  If you requested that your care partner not be given the details of your procedure findings, then the procedure report has been included in a sealed envelope for you to review at your convenience later.  YOU SHOULD EXPECT: Some feelings of bloating in the abdomen. Passage of more gas than usual.  Walking can help get rid of the air that was put into your GI tract during the procedure and reduce the bloating. If you had a lower endoscopy (such as a colonoscopy or flexible sigmoidoscopy) you may notice spotting of blood in your stool or on the toilet paper. If you underwent a bowel prep for your procedure, you may not have a normal bowel movement for a few days.  Please Note:  You might notice some irritation and congestion in your nose or some drainage.  This is from the oxygen used during your procedure.  There is no need for concern and it should clear up in a day or so.  SYMPTOMS TO REPORT IMMEDIATELY:  Following lower endoscopy (colonoscopy or flexible sigmoidoscopy):  Excessive amounts of blood in the stool  Significant tenderness or worsening of abdominal pains  Swelling of the abdomen that is new, acute  Fever of 100F or higher  Following upper endoscopy (EGD)  Vomiting of blood or coffee ground material  New chest pain or pain under the shoulder blades  Painful or persistently difficult swallowing  New shortness of breath  Fever of 100F or higher  Black, tarry-looking stools  For urgent or emergent issues, a gastroenterologist can be reached at any hour by calling [phone]. Do not use MyChart messaging for urgent concerns.     DIET:  We do recommend a small meal at first, but then you may proceed to your regular diet.  Drink plenty of fluids but you should avoid alcoholic beverages for 24 hours.  ACTIVITY:  You should plan to take it easy for the rest of today and you should NOT DRIVE or use heavy machinery until tomorrow (because of the sedation medicines used during the test).    FOLLOW UP: Our staff will call the number listed on your records the next business day following your procedure.  We will call around 7:15- 8:00 am to check on you and address any questions or concerns that you may have regarding the information given to you following your procedure. If we do not reach you, we will leave a message.     If any biopsies were taken you will be contacted by phone or by letter within the next 1-3 weeks.  Please call us at [phone] if you have not heard about the biopsies in 3 weeks.    SIGNATURES/CONFIDENTIALITY: You and/or your care partner have signed paperwork which will be entered into your electronic medical record.  These signatures attest to the fact that that the information above on your After Visit Summary has been reviewed and is understood.  Full responsibility of the confidentiality of this discharge information lies with you and/or your care-partner.

## 2022-07-22 NOTE — Progress Notes (Signed)
Pt's states no medical or surgical changes since previsit or office visit. 

## 2022-07-22 NOTE — Progress Notes (Signed)
Called to room to assist during endoscopic procedure.  Patient ID and intended procedure confirmed with present staff. Received instructions for my participation in the procedure from the performing physician.  

## 2022-07-22 NOTE — Op Note (Signed)
[facility] Patient Name: Michelle Lamb Procedure Date: 07/22/2022 3:06 PM MRN: [id number] Endoscopist: Doristine Locks , MD, [id number] Age: 32 Referring MD:  Date of Birth: 1990/07/05 Gender: Female Account #: [id number] Procedure:                Colonoscopy Indications:              Generalized abdominal pain, Change in bowel habits,                            incomplete stool evacuation Medicines:                Monitored Anesthesia Care Procedure:                Pre-Anesthesia Assessment:                           - Prior to the procedure, a History and Physical                            was performed, and patient medications and                            allergies were reviewed. The patient's tolerance of                            previous anesthesia was also reviewed. The risks                            and benefits of the procedure and the sedation                            options and risks were discussed with the patient.                            All questions were answered, and informed consent                            was obtained. Prior Anticoagulants: The patient has                            taken no anticoagulant or antiplatelet agents. ASA                            Grade Assessment: II - A patient with mild systemic                            disease. After reviewing the risks and benefits,                            the patient was deemed in satisfactory condition to                            undergo the procedure.  After obtaining informed consent, the colonoscope                            was passed under direct vision. Throughout the                            procedure, the patient's blood pressure, pulse, and                            oxygen saturations were monitored continuously. The                            Olympus SN [id number] was introduced through the anus                            with the intention of  advancing to the cecum. The                            scope was advanced to the transverse colon before                            the procedure was aborted. Medications were given.                            The colonoscopy was technically difficult and                            complex due to poor bowel prep. The patient                            tolerated the procedure well. The quality of the                            bowel preparation was poor. Scope In: 3:29:58 PM Scope Out: 3:34:07 PM Total Procedure Duration: 0 hours 4 minutes 9 seconds  Findings:                 The perianal and digital rectal examinations were                            normal.                           A large amount of stool was found in the entire                            colon, precluding visualization. Lavage of the area                            was performed using copious amounts of tap water,                            resulting in incomplete clearance with continued  poor visualization. The procedure was then aborted                            due to poor visualization.                           Non-bleeding internal hemorrhoids were found during                            retroflexion. The hemorrhoids were small. Complications:            No immediate complications. Estimated Blood Loss:     Estimated blood loss: none. Impression:               - Preparation of the colon was poor.                           - Stool in the entire examined colon.                           - Non-bleeding internal hemorrhoids.                           - No specimens collected. Recommendation:           - Patient has a contact number available for                            emergencies. The signs and symptoms of potential                            delayed complications were discussed with the                            patient. Return to normal activities tomorrow.                             Written discharge instructions were provided to the                            patient.                           - Resume previous diet.                           - Continue present medications.                           - Repeat colonoscopy at the next available                            appointment because the bowel preparation was poor.                            Plan for extended 2-day bowel preparation.                           -  Return to GI clinic.                           - Appointment in the [facility] as                            scheduled. Doristine Locks, MD 07/22/2022 3:48:56 PM

## 2022-07-22 NOTE — Progress Notes (Signed)
Uneventful anesthetic. Report to pacu rn. Vss. Care resumed by rn. 

## 2022-07-26 ENCOUNTER — Other Ambulatory Visit: Payer: Self-pay

## 2022-07-26 ENCOUNTER — Telehealth: Payer: Self-pay | Admitting: *Deleted

## 2022-07-26 ENCOUNTER — Other Ambulatory Visit (HOSPITAL_COMMUNITY): Payer: Self-pay

## 2022-07-26 MED ORDER — LISDEXAMFETAMINE DIMESYLATE 50 MG PO CAPS
50.0000 mg | ORAL_CAPSULE | Freq: Every day | ORAL | 0 refills | Status: DC
  Filled 2022-07-26: qty 30, 30d supply, fill #0

## 2022-07-26 NOTE — Telephone Encounter (Signed)
No answer for post procedure call back. Left VM. 

## 2022-07-27 ENCOUNTER — Other Ambulatory Visit (HOSPITAL_COMMUNITY): Payer: Self-pay

## 2022-07-30 DIAGNOSIS — R109 Unspecified abdominal pain: Secondary | ICD-10-CM | POA: Diagnosis not present

## 2022-08-02 ENCOUNTER — Other Ambulatory Visit: Payer: Self-pay

## 2022-08-02 DIAGNOSIS — R112 Nausea with vomiting, unspecified: Secondary | ICD-10-CM

## 2022-08-02 DIAGNOSIS — F418 Other specified anxiety disorders: Secondary | ICD-10-CM

## 2022-08-02 DIAGNOSIS — E559 Vitamin D deficiency, unspecified: Secondary | ICD-10-CM

## 2022-08-02 MED ORDER — VITAMIN D (ERGOCALCIFEROL) 1.25 MG (50000 UNIT) PO CAPS
50000.0000 [IU] | ORAL_CAPSULE | ORAL | 1 refills | Status: DC
Start: 2022-08-04 — End: 2022-10-13

## 2022-08-02 MED ORDER — ONDANSETRON 4 MG PO TBDP
4.0000 mg | ORAL_TABLET | Freq: Three times a day (TID) | ORAL | 1 refills | Status: DC | PRN
Start: 2022-08-02 — End: 2023-03-27

## 2022-08-02 MED ORDER — LORAZEPAM 1 MG PO TABS: 1.0000 mg | ORAL_TABLET | Freq: Every day | ORAL | 1 refills | Status: DC | PRN

## 2022-08-17 ENCOUNTER — Other Ambulatory Visit: Payer: Self-pay | Admitting: Physician Assistant

## 2022-08-17 DIAGNOSIS — Z0000 Encounter for general adult medical examination without abnormal findings: Secondary | ICD-10-CM

## 2022-08-17 DIAGNOSIS — D513 Other dietary vitamin B12 deficiency anemia: Secondary | ICD-10-CM | POA: Diagnosis not present

## 2022-08-17 DIAGNOSIS — D508 Other iron deficiency anemias: Secondary | ICD-10-CM

## 2022-08-17 DIAGNOSIS — E559 Vitamin D deficiency, unspecified: Secondary | ICD-10-CM

## 2022-08-18 LAB — LIPID PANEL
Chol/HDL Ratio: 2.5 ratio (ref 0.0–4.4)
Cholesterol, Total: 184 mg/dL (ref 100–199)
HDL: 74 mg/dL (ref >39)
LDL Chol Calc (NIH): 99 mg/dL (ref 0–99)
Triglycerides: 58 mg/dL (ref 0–149)
VLDL Cholesterol Cal: 11 mg/dL (ref 5–40)

## 2022-08-18 LAB — IRON,TIBC AND FERRITIN PANEL
Ferritin: 23 ng/mL (ref 15–150)
Iron Saturation: 21 % (ref 15–55)
Iron: 76 ug/dL (ref 27–159)
Total Iron Binding Capacity: 358 ug/dL (ref 250–450)
UIBC: 282 ug/dL (ref 131–425)

## 2022-08-18 LAB — CBC WITH DIFFERENTIAL/PLATELET
Basophils Absolute: 0.1 10*3/uL (ref 0.0–0.2)
Basos: 1 %
EOS (ABSOLUTE): 0.5 10*3/uL — ABNORMAL HIGH (ref 0.0–0.4)
Eos: 6 %
Hematocrit: 37 % (ref 34.0–46.6)
Hemoglobin: 12.1 g/dL (ref 11.1–15.9)
Immature Grans (Abs): 0 10*3/uL (ref 0.0–0.1)
Immature Granulocytes: 0 %
Lymphocytes Absolute: 3.4 10*3/uL — ABNORMAL HIGH (ref 0.7–3.1)
Lymphs: 38 %
MCH: 29.2 pg (ref 26.6–33.0)
MCHC: 32.7 g/dL (ref 31.5–35.7)
MCV: 89 fL (ref 79–97)
Monocytes Absolute: 0.5 10*3/uL (ref 0.1–0.9)
Monocytes: 5 %
Neutrophils Absolute: 4.5 10*3/uL (ref 1.4–7.0)
Neutrophils: 50 %
Platelets: 293 10*3/uL (ref 150–450)
RBC: 4.15 x10E6/uL (ref 3.77–5.28)
RDW: 13.4 % (ref 11.7–15.4)
WBC: 9 10*3/uL (ref 3.4–10.8)

## 2022-08-18 LAB — COMPREHENSIVE METABOLIC PANEL
ALT: 12 IU/L (ref 0–32)
AST: 13 IU/L (ref 0–40)
Albumin: 4.3 g/dL (ref 3.9–4.9)
Alkaline Phosphatase: 63 IU/L (ref 44–121)
BUN/Creatinine Ratio: 22 (ref 9–23)
BUN: 13 mg/dL (ref 6–20)
Bilirubin Total: 0.8 mg/dL (ref 0.0–1.2)
CO2: 22 mmol/L (ref 20–29)
Calcium: 9.2 mg/dL (ref 8.7–10.2)
Chloride: 102 mmol/L (ref 96–106)
Creatinine, Ser: 0.6 mg/dL (ref 0.57–1.00)
Globulin, Total: 1.8 g/dL (ref 1.5–4.5)
Glucose: 75 mg/dL (ref 70–99)
Potassium: 4.3 mmol/L (ref 3.5–5.2)
Sodium: 138 mmol/L (ref 134–144)
Total Protein: 6.1 g/dL (ref 6.0–8.5)
eGFR: 121 mL/min/{1.73_m2} (ref >59)

## 2022-08-18 LAB — HEMOGLOBIN A1C
Est. average glucose Bld gHb Est-mCnc: 105 mg/dL
Hgb A1c MFr Bld: 5.3 % (ref 4.8–5.6)

## 2022-08-18 LAB — TSH: TSH: 1.53 u[IU]/mL (ref 0.450–4.500)

## 2022-08-18 LAB — VITAMIN D 25 HYDROXY (VIT D DEFICIENCY, FRACTURES): Vit D, 25-Hydroxy: 22.2 ng/mL — ABNORMAL LOW (ref 30.0–100.0)

## 2022-08-18 LAB — B12 AND FOLATE PANEL
Folate: 3.9 ng/mL (ref >3.0)
Vitamin B-12: 578 pg/mL (ref 232–1245)

## 2022-08-20 ENCOUNTER — Encounter: Payer: Self-pay | Admitting: Certified Registered Nurse Anesthetist

## 2022-08-22 ENCOUNTER — Ambulatory Visit (INDEPENDENT_AMBULATORY_CARE_PROVIDER_SITE_OTHER): Payer: 59 | Admitting: Physician Assistant

## 2022-08-22 ENCOUNTER — Other Ambulatory Visit: Payer: Self-pay

## 2022-08-22 ENCOUNTER — Encounter: Payer: Self-pay | Admitting: Physician Assistant

## 2022-08-22 VITALS — BP 124/74 | HR 81 | Temp 97.6°F | Ht 63.0 in | Wt 174.0 lb

## 2022-08-22 DIAGNOSIS — N926 Irregular menstruation, unspecified: Secondary | ICD-10-CM

## 2022-08-22 DIAGNOSIS — R42 Dizziness and giddiness: Secondary | ICD-10-CM | POA: Diagnosis not present

## 2022-08-22 DIAGNOSIS — Z0000 Encounter for general adult medical examination without abnormal findings: Secondary | ICD-10-CM

## 2022-08-22 DIAGNOSIS — R82998 Other abnormal findings in urine: Secondary | ICD-10-CM | POA: Diagnosis not present

## 2022-08-22 DIAGNOSIS — R000 Tachycardia, unspecified: Secondary | ICD-10-CM

## 2022-08-22 HISTORY — DX: Irregular menstruation, unspecified: N92.6

## 2022-08-22 HISTORY — DX: Dizziness and giddiness: R42

## 2022-08-22 HISTORY — DX: Tachycardia, unspecified: R00.0

## 2022-08-22 LAB — POCT URINALYSIS DIP (CLINITEK)
Glucose, UA: NEGATIVE mg/dL
Ketones, POC UA: NEGATIVE mg/dL
Nitrite, UA: POSITIVE — AB
POC PROTEIN,UA: NEGATIVE
Spec Grav, UA: >=1.03 — AB (ref 1.010–1.025)
Urobilinogen, UA: 0.2 E.U./dL
pH, UA: 6 (ref 5.0–8.0)

## 2022-08-22 NOTE — Addendum Note (Signed)
Addended by: Marianne Sofia on: 08/22/2022 04:03 PM   Modules accepted: Level of Service

## 2022-08-22 NOTE — Progress Notes (Signed)
Subjective:  Patient ID: Michelle Lamb, female    DOB: 12-22-1990  Age: 32 y.o. MRN: [id number]  Chief Complaint  Patient presents with   Annual Exam    HPI Well Adult Physical: Patient here for a comprehensive physical exam.The patient reports  she has had several episodes of waking up feeling lightheaded and weak - at times associated with heart racing - has checked glucose when feeling this way and lowest reading has been 72 - usually occurs first thing in morning but has had other intermittent epidsodes  --- also pt with history of abnormal menses - is on birth control pills but after having intercourse she bleeds for a few days - she does have GYN appt next month for further evaluation Do you take any herbs or supplements that were not prescribed by a doctor? no Are you taking calcium supplements? no Are you taking aspirin daily? no  Encounter for general adult medical examination without abnormal findings  Physical ("At Risk" items are starred): Patient's last physical exam was 1 year ago .  Patient is not afflicted from Stress Incontinence and Urge Incontinence  Patient wears a seat belts Patient has smoke detectors and has carbon monoxide detectors. Patient practices appropriate gun safety. Patient wears sunscreen with extended sun exposure. Dental Care: brushes and flosses daily. Last dental visit: up to date Vision impairments: none Ophthalmology/Optometry: Annual visit.  Hearing loss: none  Pt recently had labwork which was all normal except vit D low and advised to increase supplement to twice weekly LMP - first of June Safe at home: Yes Self breast exams: Yes Last pap: 2022      08/22/2022    3:31 PM 11/30/2021   11:14 AM 09/08/2021    9:57 AM 02/08/2021    4:50 PM 11/09/2020    4:23 PM  Depression screen PHQ 2/9  Decreased Interest 1 0 0 0 0  Down, Depressed, Hopeless 2 1 0 1 0  PHQ - 2 Score 3 1 0 1 0  Altered sleeping 3 0 0 2   Tired, decreased energy  1 2 0 2   Change in appetite  0 0 0   Feeling bad or failure about yourself  0 0 0 0   Trouble concentrating 3 3 0 0   Moving slowly or fidgety/restless 0 0 0 0   Suicidal thoughts 0 0 0 0   PHQ-9 Score 10 6 0 5   Difficult doing work/chores  Somewhat difficult Not difficult at all Somewhat difficult          07/18/2021    8:00 AM 09/10/2021    3:36 PM 09/22/2021    9:47 AM 09/24/2021    2:00 PM 11/11/2021    9:15 AM  Fall Risk  (RETIRED) Patient Fall Risk Level Low fall risk Low fall risk Low fall risk Low fall risk Low fall risk             Social Hx   Social History   Socioeconomic History   Marital status: Married    Spouse name: Not on file   Number of children: 9   Years of education: 13   Highest education level: Associate degree: occupational, Scientist, product/process development, or vocational program  Occupational History   Occupation: Scientist, forensic  Tobacco Use   Smoking status: Never   Smokeless tobacco: Never  Vaping Use   Vaping Use: Never used  Substance and Sexual Activity   Alcohol use: Yes    Alcohol/week:  1.0 standard drink of alcohol    Types: 1 Standard drinks or equivalent per week    Comment: OCCASIONAL, RARELY ONCE MONTH OR TWO   Drug use: Never   Sexual activity: Yes    Partners: Male    Birth control/protection: Surgical    Comment: tubal  Other Topics Concern   Not on file  Social History Narrative   Not on file   Social Determinants of Health   Financial Resource Strain: Low Risk  (08/21/2022)   Overall Financial Resource Strain (CARDIA)    Difficulty of Paying Living Expenses: Not hard at all  Food Insecurity: No Food Insecurity (08/21/2022)   Hunger Vital Sign    Worried About Running Out of Food in the Last Year: Never true    Ran Out of Food in the Last Year: Never true  Transportation Needs: No Transportation Needs (08/21/2022)   PRAPARE - Administrator, Civil Service (Medical): No    Lack of Transportation (Non-Medical): No   Physical Activity: Insufficiently Active (08/21/2022)   Exercise Vital Sign    Days of Exercise per Week: 3 days    Minutes of Exercise per Session: 40 min  Stress: Stress Concern Present (08/21/2022)   [employer] of Occupational Health - Occupational Stress Questionnaire    Feeling of Stress : To some extent  Social Connections: Socially Integrated (08/21/2022)   Social Connection and Isolation Panel [NHANES]    Frequency of Communication with Friends and Family: More than three times a week    Frequency of Social Gatherings with Friends and Family: More than three times a week    Attends Religious Services: 1 to 4 times per year    Active Member of [employer] or Organizations: Yes    Attends Engineer, structural: More than 4 times per year    Marital Status: Married   Past Medical History:  Diagnosis Date   Absolute anemia 10/12/2021   Acute cystitis with hematuria 12/22/2020   Acute laryngopharyngitis 09/08/2021   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  Report date is 02/23/2022.      The CancerNext-Expanded gene panel offered by [facility] and includes sequencing, rearrangement, and RNA analysis for the following 77 genes: AIP, ALK, APC, ATM,  AXIN2, BAP1, BARD1, BLM, BMPR1A, BRCA1, BRCA2, BRIP1, CDC73, CDH1, CDK4, CDKN1B, CDKN2A, CHEK2, CTNNA1, DICER1, FANCC, FH, FLCN, GALNT12, KIF1B, LZTR1,   Hepatic steatosis 09/08/2021   History of herpes simplex infection    History of partial nephrectomy 03/21/2022   Formatting of this note might be different from the original. 03/15/2021: Underwent right partial nephrectomy by Dr. Laverle Patter for right renal neoplasm. Pathology: Angiomyolipoma. Formatting of this note might be different from the original. 03/15/2021: Underwent right partial nephrectomy  by Dr. Laverle Patter for right renal neoplasm. Pathology: Angiomyolipoma.   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   Morbid obesity (HCC) 06/02/2020   Neoplasm of right kidney    benign tumor of kidney   Obstructive sleep apnea 10/29/2019   Seasonal allergies    Sleep apnea    Ureteral obstruction, right 07/15/2021   Vaginal delivery 2009, 2010, 2015   Viral disease 03/02/2022   Vitamin D deficiency    Vitamin D insufficiency 09/30/2020   Weight loss 09/30/2020   Past Surgical History:  Procedure Laterality Date   CESAREAN SECTION  09/29/2016   CHOLECYSTECTOMY     COLONOSCOPY WITH ESOPHAGOGASTRODUODENOSCOPY (EGD)  07/22/2022   CYSTOSCOPY W/ URETERAL STENT PLACEMENT Right 07/15/2021   Procedure: CYSTOSCOPY WITH RETROGRADE PYELOGRAM/URETERAL STENT PLACEMENT;  Surgeon: Heloise Purpura, MD;  Location: [facility];  Service: Urology;  Laterality: Right;   CYSTOSCOPY WITH RETROGRADE PYELOGRAM, URETEROSCOPY AND STENT PLACEMENT Right 05/03/2021   Procedure: CYSTOSCOPY WITH RIGHT RETROGRADE PYELOGRAM, URETEROSCOPY;  Surgeon: Heloise Purpura, MD;  Location: [facility];  Service: Urology;  Laterality: Right;   DILATION AND CURETTAGE OF UTERUS N/A 12/31/2015   Procedure: SUCTION DILATATION AND CURETTAGE;  Surgeon: Hurricane Bing, MD;  Location: [facility];  Service: Gynecology;  Laterality: N/A;   DILATION AND EVACUATION N/A 01/01/2016   Procedure: DILATATION AND EVACUATION;  Surgeon: Tilda Burrow, MD;  Location: [facility];  Service: Gynecology;  Laterality: N/A;   ESOPHAGOGASTRODUODENOSCOPY     GASTRIC BYPASS  06/02/2020   IR NEPHRO TUBE REMOV/FL  07/16/2021   IR NEPHROSTOMY EXCHANGE RIGHT  05/12/2021   IR NEPHROSTOMY PLACEMENT RIGHT  05/05/2021   ROBOT ASSISTED PYELOPLASTY Right 07/15/2021   Procedure: XI ROBOTIC ASSISTED  LAPAROSCOPIC PYELOPLASTY;  Surgeon: Heloise Purpura, MD;  Location: [facility];  Service: Urology;  Laterality: Right;   ROBOTIC ASSITED PARTIAL NEPHRECTOMY Right 03/15/2021   Procedure: XI ROBOTIC ASSITED PARTIAL NEPHRECTOMY;  Surgeon: Heloise Purpura, MD;  Location: [facility];  Service: Urology;  Laterality: Right;   TONSILLECTOMY     TUBAL LIGATION     UPPER GASTROINTESTINAL ENDOSCOPY      Family History  Problem Relation Age of Onset   Hypertension Mother    Thyroid disease Mother    Cervical cancer Mother        dx 65s   Stomach cancer Maternal Grandmother        dx 47s   Leukemia Maternal Grandfather        d. 5s   Depression Paternal Grandmother    Cancer Paternal Grandfather        dx after 66; mets   Breast cancer Maternal Great-grandmother        dx <50; mets; MGF's mother   Stomach cancer Other        MGM's brother; dx after 60   Cancer Other        MGF's sisters x3; unknown primary   Colon cancer Neg Hx    Colon polyps Neg Hx    Esophageal cancer Neg Hx    Rectal cancer Neg Hx     ROS CONSTITUTIONAL: see HPI E/N/T: Negative for ear pain, nasal congestion and sore throat.  CARDIOVASCULAR: see HPI RESPIRATORY: Negative for recent cough and dyspnea.  GASTROINTESTINAL: Negative for  abdominal pain, acid reflux symptoms, constipation, diarrhea, nausea and vomiting.  GU - see HPI MSK: Negative for arthralgias and myalgias.  INTEGUMENTARY: Negative for rash.  NEUROLOGICAL: intermittent dizziness PSYCHIATRIC: Negative for sleep disturbance and to question depression screen.  Negative for depression, negative for anhedonia.   Objective:  PHYSICAL EXAM:   BP 124/74 (BP Location: Left Arm, Patient Position: Sitting)   Pulse 81   Temp 97.6 F (36.4 C) (Temporal)   Ht 5\' 3"  (1.6 m)   Wt 174 lb (78.9 kg)   BMI 30.82 kg/m    GEN: Well nourished, well developed, in no acute distress  HEENT: normal external ears and nose - normal external auditory canals and TMS - hearing grossly normal - - Lips, Teeth and Gums - normal  Oropharynx - normal mucosa, palate, and posterior pharynx Neck: no JVD or masses - no thyromegaly Cardiac: RRR; no murmurs, rubs, or gallops,no edema -  Respiratory:  normal respiratory rate and pattern with no distress - normal breath sounds with no rales, rhonchi, wheezes or rubs GI: normal bowel sounds, no masses or tenderness MS: no deformity or atrophy  Skin: warm and dry, no rash  Psych: euthymic mood, appropriate affect and demeanor  EKG - normal Office Visit on 08/22/2022  Component Date Value Ref Range Status   Color, UA 08/22/2022 straw (A)  yellow Final   Clarity, UA 08/22/2022 cloudy (A)  clear Final   Glucose, UA 08/22/2022 negative  negative mg/dL Final   Bilirubin, UA 63/87/5643 moderate (A)  negative Final   Ketones, POC UA 08/22/2022 negative  negative mg/dL Final   Spec Grav, UA 32/95/1884 >=1.030 (A)  1.010 - 1.025 Final   Blood, UA 08/22/2022 small (A)  negative Final   pH, UA 08/22/2022 6.0  5.0 - 8.0 Final   POC PROTEIN,UA 08/22/2022 negative  negative, trace Final   Urobilinogen, UA 08/22/2022 0.2  0.2 or 1.0 E.U./dL Final   Nitrite, UA 16/60/6301 Positive (A)  Negative Final   Leukocytes, UA 08/22/2022 Trace (A)  Negative  Final    Assessment & Plan:  Annual physical exam Labs done previous Handout given Leukocytes in urine -     POCT URINALYSIS DIP (CLINITEK) -     Urine Culture  Dizziness -     EKG 12-Lead -     LONG TERM MONITOR (3-14 DAYS); Future  Racing heart beat -     EKG 12-Lead -     LONG TERM MONITOR (3-14 DAYS); Future  Abnormal menses Follow up with GYN as scheduled   This is a list of the screening recommended for you and due dates:  Health Maintenance  Topic Date Due   Eye exam for diabetics  07/09/2021   Yearly kidney health urinalysis for diabetes  04/06/2027*   Flu Shot  09/29/2022   Yearly kidney function blood test for diabetes  08/17/2023   Pap Smear  09/24/2023   DTaP/Tdap/Td vaccine (2 - Td or Tdap) 08/11/2026   HPV Vaccine  Aged Out   Complete foot exam   Discontinued   Hemoglobin A1C  Discontinued   COVID-19 Vaccine  Discontinued   Hepatitis C Screening  Discontinued   HIV Screening  Discontinued  *Topic was postponed. The date shown is not the original due date.     Follow-up: Return in about 6 months (around 02/21/2023) for chronic fasting follow-up.  An After Visit Summary was  printed and given to the patient.  Vickey Sages, PA-C [facility] [phone]

## 2022-08-22 NOTE — Addendum Note (Signed)
Addended by: Marianne Sofia on: 08/22/2022 04:02 PM   Modules accepted: Level of Service

## 2022-08-23 ENCOUNTER — Ambulatory Visit: Payer: 59 | Attending: Physician Assistant

## 2022-08-23 DIAGNOSIS — R000 Tachycardia, unspecified: Secondary | ICD-10-CM

## 2022-08-23 DIAGNOSIS — R42 Dizziness and giddiness: Secondary | ICD-10-CM

## 2022-08-23 NOTE — Progress Notes (Unsigned)
Enrolled patient for a 14 day Zio XT monitor to be mailed to patients home.   Revankar to read 

## 2022-08-25 ENCOUNTER — Encounter: Payer: Self-pay | Admitting: Gastroenterology

## 2022-08-25 ENCOUNTER — Ambulatory Visit (AMBULATORY_SURGERY_CENTER): Payer: 59 | Admitting: Gastroenterology

## 2022-08-25 VITALS — BP 128/73 | HR 56 | Temp 98.0°F | Resp 10 | Ht 63.0 in | Wt 177.0 lb

## 2022-08-25 DIAGNOSIS — I10 Essential (primary) hypertension: Secondary | ICD-10-CM | POA: Diagnosis not present

## 2022-08-25 DIAGNOSIS — R194 Change in bowel habit: Secondary | ICD-10-CM | POA: Diagnosis not present

## 2022-08-25 DIAGNOSIS — K921 Melena: Secondary | ICD-10-CM

## 2022-08-25 DIAGNOSIS — K625 Hemorrhage of anus and rectum: Secondary | ICD-10-CM

## 2022-08-25 DIAGNOSIS — G4733 Obstructive sleep apnea (adult) (pediatric): Secondary | ICD-10-CM | POA: Diagnosis not present

## 2022-08-25 MED ORDER — SODIUM CHLORIDE 0.9 % IV SOLN
500.0000 mL | Freq: Once | INTRAVENOUS | Status: DC
Start: 2022-08-25 — End: 2022-08-25

## 2022-08-25 NOTE — Progress Notes (Signed)
Pt CBG 64 pt asymptomatic. MD and CRNA made aware. IV D5W started via RT AC peripheral IV.  Re-check CBG 86.

## 2022-08-25 NOTE — Op Note (Signed)
[facility] Patient Name: Michelle Lamb Procedure Date: 08/25/2022 10:08 AM MRN: [id number] Endoscopist: Doristine Locks , MD, [id number] Age: 32 Referring MD:  Date of Birth: Mar 12, 1990 Gender: Female Account #: [id number] Procedure:                Colonoscopy Indications:              Hematochezia, Change in bowel habits Medicines:                Monitored Anesthesia Care Procedure:                Pre-Anesthesia Assessment:                           - Prior to the procedure, a History and Physical                            was performed, and patient medications and                            allergies were reviewed. The patient's tolerance of                            previous anesthesia was also reviewed. The risks                            and benefits of the procedure and the sedation                            options and risks were discussed with the patient.                            All questions were answered, and informed consent                            was obtained. Prior Anticoagulants: The patient has                            taken no anticoagulant or antiplatelet agents. ASA                            Grade Assessment: II - A patient with mild systemic                            disease. After reviewing the risks and benefits,                            the patient was deemed in satisfactory condition to                            undergo the procedure.                           After obtaining informed consent, the colonoscope  was passed under direct vision. Throughout the                            procedure, the patient's blood pressure, pulse, and                            oxygen saturations were monitored continuously. The                            CF [id number] #[id number] was introduced through the anus                            and advanced to the the terminal ileum. The                            colonoscopy was  performed without difficulty. The                            patient tolerated the procedure well. The quality                            of the bowel preparation was good. The terminal                            ileum, ileocecal valve, appendiceal orifice, and                            rectum were photographed. Scope In: 10:25:48 AM Scope Out: 10:39:01 AM Scope Withdrawal Time: 0 hours 8 minutes 25 seconds  Total Procedure Duration: 0 hours 13 minutes 13 seconds  Findings:                 The perianal and digital rectal examinations were                            normal.                           The entire colon appeared normal. Biopsies for                            histology were taken with a cold forceps from the                            right colon and left colon for evaluation of                            microscopic colitis. Estimated blood loss was                            minimal.                           The retroflexed view of the distal rectum and anal  verge was normal and showed no anal or rectal                            abnormalities.                           The terminal ileum appeared normal. Complications:            No immediate complications. Estimated Blood Loss:     Estimated blood loss was minimal. Impression:               - The entire examined colon is normal. Biopsied.                           - The distal rectum and anal verge are normal on                            retroflexion view.                           - The examined portion of the ileum was normal. Recommendation:           - Patient has a contact number available for                            emergencies. The signs and symptoms of potential                            delayed complications were discussed with the                            patient. Return to normal activities tomorrow.                            Written discharge instructions were provided to  the                            patient.                           - Resume previous diet.                           - Continue present medications.                           - Await pathology results.                           - Repeat colonoscopy at age 3 for screening                            purposes.                           - Use fiber, for example Citrucel, Fibercon, [employer]  or Metamucil.                           - Return to GI clinic PRN. Doristine Locks, MD 08/25/2022 10:47:37 AM

## 2022-08-25 NOTE — Progress Notes (Signed)
Report given to PACU, vss 

## 2022-08-25 NOTE — Patient Instructions (Signed)
Resume previous diet.  Continue present medications.  Await pathology results.  Repeat colonoscopy at age 32 for screening purposes.  Use fiber, for example Citrucel, Fibercon, Konsyl or Metamucil.  Return to GI clinic as needed.   YOU HAD AN ENDOSCOPIC PROCEDURE TODAY AT THE [facility]:   Refer to the procedure report that was given to you for any specific questions about what was found during the examination.  If the procedure report does not answer your questions, please call your gastroenterologist to clarify.  If you requested that your care partner not be given the details of your procedure findings, then the procedure report has been included in a sealed envelope for you to review at your convenience later.  YOU SHOULD EXPECT: Some feelings of bloating in the abdomen. Passage of more gas than usual.  Walking can help get rid of the air that was put into your GI tract during the procedure and reduce the bloating. If you had a lower endoscopy (such as a colonoscopy or flexible sigmoidoscopy) you may notice spotting of blood in your stool or on the toilet paper. If you underwent a bowel prep for your procedure, you may not have a normal bowel movement for a few days.  Please Note:  You might notice some irritation and congestion in your nose or some drainage.  This is from the oxygen used during your procedure.  There is no need for concern and it should clear up in a day or so.  SYMPTOMS TO REPORT IMMEDIATELY:  Following lower endoscopy (colonoscopy or flexible sigmoidoscopy):  Excessive amounts of blood in the stool  Significant tenderness or worsening of abdominal pains  Swelling of the abdomen that is new, acute  Fever of 100F or higher  For urgent or emergent issues, a gastroenterologist can be reached at any hour by calling [phone]. Do not use MyChart messaging for urgent concerns.    DIET:  We do recommend a small meal at first, but then you may proceed to  your regular diet.  Drink plenty of fluids but you should avoid alcoholic beverages for 24 hours.  ACTIVITY:  You should plan to take it easy for the rest of today and you should NOT DRIVE or use heavy machinery until tomorrow (because of the sedation medicines used during the test).    FOLLOW UP: Our staff will call the number listed on your records the next business day following your procedure.  We will call around 7:15- 8:00 am to check on you and address any questions or concerns that you may have regarding the information given to you following your procedure. If we do not reach you, we will leave a message.     If any biopsies were taken you will be contacted by phone or by letter within the next 1-3 weeks.  Please call us at [phone] if you have not heard about the biopsies in 3 weeks.    SIGNATURES/CONFIDENTIALITY: You and/or your care partner have signed paperwork which will be entered into your electronic medical record.  These signatures attest to the fact that that the information above on your After Visit Summary has been reviewed and is understood.  Full responsibility of the confidentiality of this discharge information lies with you and/or your care-partner.

## 2022-08-25 NOTE — Progress Notes (Signed)
GASTROENTEROLOGY PROCEDURE H&P NOTE   Primary Care Physician: Marianne Sofia, PA-C    Reason for Procedure:   Change in bowel habits, hematochezia  Plan:    Colonoscopy  Patient is appropriate for endoscopic procedure(s) in the ambulatory ([facility]) setting.  The nature of the procedure, as well as the risks, benefits, and alternatives were carefully and thoroughly reviewed with the patient. Ample time for discussion and questions allowed. The patient understood, was satisfied, and agreed to proceed.     HPI: Michelle Lamb is a 32 y.o. female who presents for Colonoscopy for change in bowel habits and prior BRBPR. Colonoscopy in 06/2022 with poor prep. Returns today after completing 2-day bowel prep.   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  Report date is 02/23/2022.      The CancerNext-Expanded gene panel offered by [facility] and includes sequencing, rearrangement, and RNA analysis for the following 77 genes: AIP, ALK, APC, ATM, AXIN2, BAP1, BARD1, BLM, BMPR1A, BRCA1, BRCA2, BRIP1, CDC73, CDH1, CDK4,  CDKN1B, CDKN2A, CHEK2, CTNNA1, DICER1, FANCC, FH, FLCN, GALNT12, KIF1B, LZTR1,   Hepatic steatosis 09/08/2021   History of herpes simplex infection    History of partial nephrectomy 03/21/2022   Formatting of this note might be different from the original. 03/15/2021: Underwent right partial nephrectomy by Dr. Laverle Patter for right renal neoplasm. Pathology: Angiomyolipoma. Formatting of this note might be different from the original. 03/15/2021: Underwent right partial nephrectomy by Dr. Laverle Patter for right renal neoplasm. Pathology: Angiomyolipoma.   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   Morbid obesity (HCC) 06/02/2020   Neoplasm of right kidney    benign tumor of kidney   Obstructive sleep apnea 10/29/2019   Seasonal allergies    Sleep apnea    Ureteral obstruction, right 07/15/2021   Vaginal delivery 2009, 2010, 2015   Viral disease 03/02/2022   Vitamin D deficiency    Vitamin D insufficiency 09/30/2020   Weight loss 09/30/2020    Past Surgical History:  Procedure Laterality Date   CESAREAN SECTION  09/29/2016   CHOLECYSTECTOMY     COLONOSCOPY WITH ESOPHAGOGASTRODUODENOSCOPY (EGD)  07/22/2022   CYSTOSCOPY W/ URETERAL STENT PLACEMENT Right 07/15/2021   Procedure: CYSTOSCOPY WITH RETROGRADE PYELOGRAM/URETERAL STENT PLACEMENT;  Surgeon: Heloise Purpura, MD;  Location: [facility];  Service: Urology;  Laterality: Right;   CYSTOSCOPY WITH RETROGRADE PYELOGRAM, URETEROSCOPY AND STENT PLACEMENT Right 05/03/2021   Procedure: CYSTOSCOPY WITH RIGHT RETROGRADE PYELOGRAM, URETEROSCOPY;  Surgeon: Heloise Purpura, MD;  Location: [facility];  Service: Urology;  Laterality: Right;    DILATION AND CURETTAGE OF UTERUS N/A 12/31/2015   Procedure: SUCTION DILATATION AND CURETTAGE;  Surgeon: Melcher-Dallas Bing, MD;  Location: [facility];  Service: Gynecology;  Laterality: N/A;   DILATION AND EVACUATION N/A 01/01/2016   Procedure: DILATATION AND EVACUATION;  Surgeon: Tilda Burrow, MD;  Location: [facility];  Service: Gynecology;  Laterality: N/A;   ESOPHAGOGASTRODUODENOSCOPY     GASTRIC BYPASS  06/02/2020   IR NEPHRO TUBE REMOV/FL  07/16/2021   IR NEPHROSTOMY EXCHANGE RIGHT  05/12/2021   IR NEPHROSTOMY PLACEMENT RIGHT  05/05/2021   ROBOT ASSISTED PYELOPLASTY Right 07/15/2021   Procedure: XI ROBOTIC ASSISTED  LAPAROSCOPIC PYELOPLASTY;  Surgeon: Heloise Purpura, MD;  Location: [facility];  Service: Urology;  Laterality: Right;   ROBOTIC ASSITED PARTIAL NEPHRECTOMY Right 03/15/2021   Procedure: XI ROBOTIC ASSITED PARTIAL NEPHRECTOMY;  Surgeon: Heloise Purpura, MD;  Location: [facility];  Service: Urology;  Laterality: Right;   TONSILLECTOMY     TUBAL LIGATION     UPPER GASTROINTESTINAL ENDOSCOPY      Prior to Admission medications   Medication Sig Start Date End Date Taking? Authorizing Provider  lisdexamfetamine (VYVANSE) 50 MG capsule Take 1 capsule (50 mg total) by mouth daily. 07/26/22  Yes Marianne Sofia, PA-C  LORazepam (ATIVAN) 1 MG tablet Take 1 tablet (1 mg total) by mouth daily as needed. 08/02/22  Yes Marianne Sofia, PA-C  Norethindrone Acetate-Ethinyl Estrad-FE (JUNEL FE 24) 1-20 MG-MCG(24) tablet Take 1 tablet by mouth daily. 01/06/22  Yes Chrzanowski, Jami B, NP  ondansetron (ZOFRAN-ODT) 4 MG disintegrating tablet Take 1 tablet (4 mg total) by mouth every 8 (eight) hours as needed for nausea or vomiting. 08/02/22  Yes Marianne Sofia, PA-C  polyethylene glycol (MIRALAX / GLYCOLAX) 17 g packet Take 17 g by mouth 2 (two) times daily.   Yes [provider]  Vitamin D, Ergocalciferol, (DRISDOL) 1.25 MG (50000 UNIT) CAPS capsule Take 1 capsule (50,000 Units total) by mouth 2 (two) times a week.  08/04/22  Yes Marianne Sofia, PA-C  albuterol (VENTOLIN HFA) 108 (90 Base) MCG/ACT inhaler Inhale 2 puffs into the lungs every 6 (six) hours as needed for wheezing or shortness of breath. 09/08/21   Marianne Sofia, PA-C  budesonide-formoterol [facility]) 80-4.5 MCG/ACT inhaler Inhale 2 puffs into the lungs daily as needed (wheezing). 06/05/19   [provider]  cyanocobalamin (VITAMIN B12) 1000 MCG/ML injection Inject 1 ML as directed once a month 06/23/22   Marianne Sofia, PA-C  cyclobenzaprine (FLEXERIL) 10 MG tablet Take 1 tablet (10 mg total) by mouth 3 (three) times daily as needed for muscle spasms. 04/19/22   Marianne Sofia, PA-C  fluticasone [facility]) 50 MCG/ACT nasal spray Place 2 sprays into both nostrils daily. Patient taking differently: Place 2 sprays into both nostrils as needed for allergies or rhinitis. 03/21/22   Janie Morning, NP  hyoscyamine (LEVSIN SL) 0.125 MG SL tablet Place 1 tablet (0.125 mg total) under the tongue every 6 (six) hours as needed. 04/22/22   Arnaldo Natal, NP  omeprazole (PRILOSEC) 20 MG capsule TAKE 1 CAPSULE TWICE DAILY Patient taking differently: Take 20 mg by mouth daily. 10/19/21   Janie Morning, NP  Semaglutide,0.25 or 0.5MG /DOS, (OZEMPIC, 0.25 OR 0.5 MG/DOSE,) 2 MG/3ML SOPN Inject 0.5  mg into the skin once a week. Patient not taking: Reported on 08/22/2022 06/23/22   Marianne Sofia, PA-C    Current Outpatient Medications  Medication Sig Dispense Refill   lisdexamfetamine (VYVANSE) 50 MG capsule Take 1 capsule (50 mg total) by mouth daily. 30 capsule 0   LORazepam (ATIVAN) 1 MG tablet Take 1 tablet (1 mg total) by mouth daily as needed. 30 tablet 1   Norethindrone Acetate-Ethinyl Estrad-FE (JUNEL FE 24) 1-20 MG-MCG(24) tablet Take 1 tablet by mouth daily. 84 tablet 2   ondansetron (ZOFRAN-ODT) 4 MG disintegrating tablet Take 1 tablet (4 mg total) by mouth every 8 (eight) hours as needed for nausea or vomiting. 90 tablet 1   polyethylene glycol  (MIRALAX / GLYCOLAX) 17 g packet Take 17 g by mouth 2 (two) times daily.     Vitamin D, Ergocalciferol, (DRISDOL) 1.25 MG (50000 UNIT) CAPS capsule Take 1 capsule (50,000 Units total) by mouth 2 (two) times a week. 24 capsule 1   albuterol (VENTOLIN HFA) 108 (90 Base) MCG/ACT inhaler Inhale 2 puffs into the lungs every 6 (six) hours as needed for wheezing or shortness of breath. 1 each 5   budesonide-formoterol (SYMBICORT) 80-4.5 MCG/ACT inhaler Inhale 2 puffs into the lungs daily as needed (wheezing).     cyanocobalamin (VITAMIN B12) 1000 MCG/ML injection Inject 1 ML as directed once a month 1 mL 2   cyclobenzaprine (FLEXERIL) 10 MG tablet Take 1 tablet (10 mg total) by mouth 3 (three) times daily as needed for muscle spasms. 30 tablet 0   fluticasone (FLONASE) 50 MCG/ACT nasal spray Place 2 sprays into both nostrils daily. (Patient taking differently: Place 2 sprays into both nostrils as needed for allergies or rhinitis.) 16 g 6   hyoscyamine (LEVSIN SL) 0.125 MG SL tablet Place 1 tablet (0.125 mg total) under the tongue every 6 (six) hours as needed. 30 tablet 1   omeprazole (PRILOSEC) 20 MG capsule TAKE 1 CAPSULE TWICE DAILY (Patient taking differently: Take 20 mg by mouth daily.) 180 capsule 1   Semaglutide,0.25 or 0.5MG /DOS, (OZEMPIC, 0.25 OR 0.5 MG/DOSE,) 2 MG/3ML SOPN Inject 0.5 mg into the skin once a week. (Patient not taking: Reported on 08/22/2022) 3 mL 3   Current Facility-Administered Medications  Medication Dose Route Frequency Provider Last Rate Last Admin   0.9 %  sodium chloride infusion  500 mL Intravenous Once Bristol Soy V, DO        Allergies as of 08/25/2022   (No Known Allergies)    Family History  Problem Relation Age of Onset   Hypertension Mother    Thyroid disease Mother    Cervical cancer Mother        dx 49s   Stomach cancer Maternal Grandmother        dx 108s   Leukemia Maternal Grandfather        d. 17s   Depression Paternal Grandmother    Cancer  Paternal Grandfather        dx after 67; mets   Breast cancer Maternal Great-grandmother        dx <50; mets; MGF's mother   Stomach cancer Other        MGM's brother; dx after 18   Cancer Other        MGF's sisters x3; unknown primary   Colon cancer Neg Hx    Colon polyps Neg Hx    Esophageal cancer Neg Hx    Rectal cancer Neg Hx     Social History  Socioeconomic History   Marital status: Married    Spouse name: Not on file   Number of children: 9   Years of education: 13   Highest education level: Associate degree: occupational, Scientist, product/process development, or vocational program  Occupational History   Occupation: Scientist, forensic  Tobacco Use   Smoking status: Never   Smokeless tobacco: Never  Vaping Use   Vaping Use: Never used  Substance and Sexual Activity   Alcohol use: Yes    Alcohol/week: 1.0 standard drink of alcohol    Types: 1 Standard drinks or equivalent per week    Comment: OCCASIONAL, RARELY ONCE MONTH OR TWO   Drug use: Never   Sexual activity: Yes    Partners: Male    Birth control/protection: Surgical    Comment: tubal  Other Topics Concern   Not on file  Social History Narrative   Not on file   Social Determinants of Health   Financial Resource Strain: Low Risk  (08/21/2022)   Overall Financial Resource Strain (CARDIA)    Difficulty of Paying Living Expenses: Not hard at all  Food Insecurity: No Food Insecurity (08/21/2022)   Hunger Vital Sign    Worried About Running Out of Food in the Last Year: Never true    Ran Out of Food in the Last Year: Never true  Transportation Needs: No Transportation Needs (08/21/2022)   PRAPARE - Administrator, Civil Service (Medical): No    Lack of Transportation (Non-Medical): No  Physical Activity: Insufficiently Active (08/21/2022)   Exercise Vital Sign    Days of Exercise per Week: 3 days    Minutes of Exercise per Session: 40 min  Stress: Stress Concern Present (08/21/2022)   [employer] of  Occupational Health - Occupational Stress Questionnaire    Feeling of Stress : To some extent  Social Connections: Socially Integrated (08/21/2022)   Social Connection and Isolation Panel [NHANES]    Frequency of Communication with Friends and Family: More than three times a week    Frequency of Social Gatherings with Friends and Family: More than three times a week    Attends Religious Services: 1 to 4 times per year    Active Member of [employer] or Organizations: Yes    Attends Engineer, structural: More than 4 times per year    Marital Status: Married  Catering manager Violence: Not on file    Physical Exam: Vital signs in last 24 hours: @BP  (!) 150/93   Pulse (!) 58   Temp 98 F (36.7 C)   Resp 16   Ht 5\' 3"  (1.6 m)   Wt 177 lb (80.3 kg)   LMP 08/01/2022 Comment: tubalation  SpO2 100%   BMI 31.35 kg/m  GEN: NAD EYE: Sclerae anicteric ENT: MMM CV: Non-tachycardic Pulm: CTA b/l GI: Soft, NT/ND NEURO:  Alert & Oriented x 3   Doristine Locks, DO [facility]   08/25/2022 10:18 AM

## 2022-08-25 NOTE — Progress Notes (Signed)
Called to room to assist during endoscopic procedure.  Patient ID and intended procedure confirmed with present staff. Received instructions for my participation in the procedure from the performing physician.  

## 2022-08-26 ENCOUNTER — Telehealth: Payer: Self-pay

## 2022-08-26 NOTE — Telephone Encounter (Signed)
  Follow up Call-     08/25/2022    9:48 AM 07/22/2022    2:19 PM  Call back number  Post procedure Call Back phone  # [phone]  Permission to leave phone message Yes Yes     Patient questions:  Do you have a fever, pain , or abdominal swelling? Yes.   Pain Score  2 *  Have you tolerated food without any problems? Yes.    Have you been able to return to your normal activities? Yes.    Do you have any questions about your discharge instructions: Diet   No. Medications  No. Follow up visit  No.  Do you have questions or concerns about your Care? No.  Actions: * If pain score is 4 or above: No action needed, pain <4.  Pt states some lower abdominal pain that is the same as before the procedure. Passing some flats and taking Gas-X. Will monitor pain throughout the day and call if it gets worse. Also states some bright red blood in her stools. Advised that it is normal to have some blood in the stools, but to call us back if bleeding continues or gets worse.

## 2022-08-28 DIAGNOSIS — R42 Dizziness and giddiness: Secondary | ICD-10-CM | POA: Diagnosis not present

## 2022-08-28 DIAGNOSIS — R000 Tachycardia, unspecified: Secondary | ICD-10-CM | POA: Diagnosis not present

## 2022-09-06 ENCOUNTER — Other Ambulatory Visit: Payer: Self-pay | Admitting: Physician Assistant

## 2022-09-06 ENCOUNTER — Other Ambulatory Visit: Payer: Self-pay

## 2022-09-06 MED ORDER — LISDEXAMFETAMINE DIMESYLATE 50 MG PO CAPS
50.0000 mg | ORAL_CAPSULE | Freq: Every day | ORAL | 0 refills | Status: DC
  Filled 2022-09-06: qty 30, 30d supply, fill #0

## 2022-09-06 MED ORDER — LISDEXAMFETAMINE DIMESYLATE 50 MG PO CAPS
50.0000 mg | ORAL_CAPSULE | Freq: Every day | ORAL | 0 refills | Status: DC
  Filled 2022-09-06 – 2022-09-07 (×2): qty 30, 30d supply, fill #0

## 2022-09-07 ENCOUNTER — Other Ambulatory Visit: Payer: Self-pay

## 2022-09-07 ENCOUNTER — Other Ambulatory Visit (HOSPITAL_COMMUNITY): Payer: Self-pay

## 2022-09-08 ENCOUNTER — Ambulatory Visit: Payer: 59 | Admitting: Radiology

## 2022-09-09 ENCOUNTER — Other Ambulatory Visit (HOSPITAL_COMMUNITY): Payer: Self-pay

## 2022-09-15 DIAGNOSIS — R000 Tachycardia, unspecified: Secondary | ICD-10-CM | POA: Diagnosis not present

## 2022-09-15 DIAGNOSIS — R42 Dizziness and giddiness: Secondary | ICD-10-CM | POA: Diagnosis not present

## 2022-09-16 ENCOUNTER — Other Ambulatory Visit: Payer: Self-pay | Admitting: Physician Assistant

## 2022-09-16 ENCOUNTER — Encounter: Payer: Self-pay | Admitting: Physician Assistant

## 2022-09-16 LAB — HM DIABETES EYE EXAM

## 2022-09-28 ENCOUNTER — Encounter: Payer: Self-pay | Admitting: Physician Assistant

## 2022-09-28 ENCOUNTER — Ambulatory Visit: Payer: 59 | Admitting: Physician Assistant

## 2022-09-28 VITALS — BP 118/80 | HR 88 | Temp 97.6°F | Ht 63.0 in | Wt 174.0 lb

## 2022-09-28 DIAGNOSIS — N3000 Acute cystitis without hematuria: Secondary | ICD-10-CM

## 2022-09-28 DIAGNOSIS — D49511 Neoplasm of unspecified behavior of right kidney: Secondary | ICD-10-CM | POA: Diagnosis not present

## 2022-09-28 DIAGNOSIS — F418 Other specified anxiety disorders: Secondary | ICD-10-CM

## 2022-09-28 DIAGNOSIS — N390 Urinary tract infection, site not specified: Secondary | ICD-10-CM

## 2022-09-28 LAB — POCT URINALYSIS DIP (CLINITEK)
Blood, UA: NEGATIVE
Glucose, UA: NEGATIVE mg/dL
Ketones, POC UA: NEGATIVE mg/dL
Nitrite, UA: POSITIVE — AB
Spec Grav, UA: >=1.03 — AB (ref 1.010–1.025)
Urobilinogen, UA: 0.2 E.U./dL
pH, UA: 6 (ref 5.0–8.0)

## 2022-09-28 MED ORDER — VENLAFAXINE HCL ER 37.5 MG PO CP24
37.5000 mg | ORAL_CAPSULE | Freq: Every day | ORAL | 2 refills | Status: DC
Start: 2022-09-28 — End: 2022-10-19

## 2022-09-28 MED ORDER — NITROFURANTOIN MONOHYD MACRO 100 MG PO CAPS
100.0000 mg | ORAL_CAPSULE | Freq: Two times a day (BID) | ORAL | 0 refills | Status: DC
Start: 2022-09-28 — End: 2022-10-13

## 2022-09-28 NOTE — Progress Notes (Signed)
Subjective:  Patient ID: Michelle Lamb, female    DOB: 10/10/1990  Age: 32 y.o. MRN: [id number]  Chief Complaint  Patient presents with   Back Pain   Depression    HPI Pt states that for the past few days she has had low back pain on her right side along with some malaise.  Denies fever or GI symptoms.  Although she denies any urine symptoms this is her usual presentation of uti Pt has seen urology in past but is due for follow up - she would like referral to Dr Pete Glatter in [facility] for history of recurrent utis and also her history of right kidney neoplasm (noncancerous)  Pt states she has felt very agitated and irritated recently.  She feels anxiety and having to take lorazepam nightly.  She does feel a mild degree of depression as well.  She had been on both citalopram and Lexapro in the past but states neither seemed to help her.  Is having a moderate amount of stress at home and at work.  Pt is not suicidal She is agreeable to try medication to help these symptoms     08/22/2022    3:31 PM 11/30/2021   11:14 AM 09/08/2021    9:57 AM 02/08/2021    4:50 PM 11/09/2020    4:23 PM  Depression screen PHQ 2/9  Decreased Interest 1 0 0 0 0  Down, Depressed, Hopeless 2 1 0 1 0  PHQ - 2 Score 3 1 0 1 0  Altered sleeping 3 0 0 2   Tired, decreased energy 1 2 0 2   Change in appetite  0 0 0   Feeling bad or failure about yourself  0 0 0 0   Trouble concentrating 3 3 0 0   Moving slowly or fidgety/restless 0 0 0 0   Suicidal thoughts 0 0 0 0   PHQ-9 Score 10 6 0 5   Difficult doing work/chores  Somewhat difficult Not difficult at all Somewhat difficult         07/18/2021    8:00 AM 09/10/2021    3:36 PM 09/22/2021    9:47 AM 09/24/2021    2:00 PM 11/11/2021    9:15 AM  Fall Risk  (RETIRED) Patient Fall Risk Level Low fall risk Low fall risk Low fall risk Low fall risk Low fall risk     ROS CONSTITUTIONAL: see HPI CARDIOVASCULAR: pt is having palpitations - has follow up  with cardiology in 2 weeks RESPIRATORY: Negative for recent cough and dyspnea.  GASTROINTESTINAL: Negative for abdominal pain, acid reflux symptoms, constipation, diarrhea, nausea and vomiting.  GU - see HPI PSYCHIATRIC: see HPI   Current Outpatient Medications:    nitrofurantoin, macrocrystal-monohydrate, (MACROBID) 100 MG capsule, Take 1 capsule (100 mg total) by mouth 2 (two) times daily., Disp: 20 capsule, Rfl: 0   venlafaxine XR (EFFEXOR-XR) 37.5 MG 24 hr capsule, Take 1 capsule (37.5 mg total) by mouth daily with breakfast., Disp: 30 capsule, Rfl: 2   albuterol (VENTOLIN HFA) 108 (90 Base) MCG/ACT inhaler, Inhale 2 puffs into the lungs every 6 (six) hours as needed for wheezing or shortness of breath., Disp: 1 each, Rfl: 5   cyanocobalamin (VITAMIN B12) 1000 MCG/ML injection, Inject 1 ML as directed once a month, Disp: 1 mL, Rfl: 2   cyclobenzaprine (FLEXERIL) 10 MG tablet, Take 1 tablet (10 mg total) by mouth 3 (three) times daily as needed for muscle spasms., Disp: 30 tablet, Rfl:  0   fluticasone (FLONASE) 50 MCG/ACT nasal spray, Place 2 sprays into both nostrils daily. (Patient taking differently: Place 2 sprays into both nostrils as needed for allergies or rhinitis.), Disp: 16 g, Rfl: 6   lisdexamfetamine (VYVANSE) 50 MG capsule, Take 1 capsule (50 mg total) by mouth daily., Disp: 30 capsule, Rfl: 0   LORazepam (ATIVAN) 1 MG tablet, Take 1 tablet (1 mg total) by mouth daily as needed., Disp: 30 tablet, Rfl: 1   Norethindrone Acetate-Ethinyl Estrad-FE (JUNEL FE 24) 1-20 MG-MCG(24) tablet, Take 1 tablet by mouth daily., Disp: 84 tablet, Rfl: 2   omeprazole (PRILOSEC) 20 MG capsule, TAKE 1 CAPSULE TWICE DAILY (Patient taking differently: Take 20 mg by mouth daily.), Disp: 180 capsule, Rfl: 1   ondansetron (ZOFRAN-ODT) 4 MG disintegrating tablet, Take 1 tablet (4 mg total) by mouth every 8 (eight) hours as needed for nausea or vomiting., Disp: 90 tablet, Rfl: 1   polyethylene glycol (MIRALAX  / GLYCOLAX) 17 g packet, Take 17 g by mouth 2 (two) times daily., Disp: , Rfl:    Vitamin D, Ergocalciferol, (DRISDOL) 1.25 MG (50000 UNIT) CAPS capsule, Take 1 capsule (50,000 Units total) by mouth 2 (two) times a week., Disp: 24 capsule, Rfl: 1  Past Medical History:  Diagnosis Date   Abnormal menses 08/22/2022   Absolute anemia 10/12/2021   Acute cystitis with hematuria 12/22/2020   Acute laryngopharyngitis 09/08/2021   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  Report date is 02/23/2022.      The CancerNext-Expanded gene panel offered by [facility] and includes sequencing, rearrangement, and RNA analysis for the following 77 genes: AIP, ALK, APC, ATM, AXIN2, BAP1, BARD1, BLM, BMPR1A, BRCA1, BRCA2, BRIP1, CDC73, CDH1, CDK4, CDKN1B, CDKN2A, CHEK2, CTNNA1, DICER1, FANCC, FH, FLCN, GALNT12, KIF1B, LZTR1,   Hepatic steatosis 09/08/2021   History of herpes simplex infection    History of partial nephrectomy 03/21/2022   Formatting of this note might be different from the original. 03/15/2021: Underwent right partial nephrectomy by Dr. Laverle Patter for right renal neoplasm. Pathology: Angiomyolipoma. Formatting  of this note might be different from the original. 03/15/2021: Underwent right partial nephrectomy by Dr. Laverle Patter for right renal neoplasm. Pathology: Angiomyolipoma.   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   Morbid obesity (HCC) 06/02/2020   Neoplasm of right kidney  benign tumor of kidney   Obesity (BMI 30.0-34.9) 06/08/2022   Obstructive sleep apnea 10/29/2019   Racing heart beat 08/22/2022   Seasonal allergies    Ureteral obstruction, right 07/15/2021   Vaginal delivery 2009, 2010, 2015   Viral disease 03/02/2022   Vitamin D deficiency    Vitamin D insufficiency 09/30/2020   Weight loss 09/30/2020   Objective:  PHYSICAL EXAM:   BP 118/80   Pulse 88   Temp 97.6 F (36.4 C)   Ht 5\' 3"  (1.6 m)   Wt 174 lb (78.9 kg)   SpO2 98%   BMI 30.82 kg/m    GEN: Well nourished, well developed, in no acute distress  Cardiac: RRR; no murmurs, rubs, or gallops,no edema  Respiratory:  normal respiratory rate and pattern with no distress - normal breath sounds with no rales, rhonchi, wheezes or rubs Psych: tearful, depressed mood  Assessment & Plan:    Acute cystitis without hematuria -     Nitrofurantoin Monohyd Macro; Take 1 capsule (100 mg total) by mouth 2 (two) times daily.  Dispense: 20 capsule; Refill: 0 -     Urine Culture -     POCT URINALYSIS DIP (CLINITEK)  Depression with anxiety -     Venlafaxine HCl ER; Take 1 capsule (37.5 mg total) by mouth daily with breakfast.  Dispense: 30 capsule; Refill: 2  Recurrent UTI -     Ambulatory referral to Urology  Neoplasm of right kidney -     Ambulatory referral to  Urology     Follow-up: Return in about 3 weeks (around 10/19/2022) for follow-up - 10 days to repeat ua.  An After Visit Summary was printed and given to the patient.  Jettie Pagan [facility] [phone]

## 2022-09-29 ENCOUNTER — Ambulatory Visit: Payer: 59 | Admitting: Radiology

## 2022-10-04 ENCOUNTER — Ambulatory Visit: Payer: 59 | Admitting: Cardiology

## 2022-10-10 ENCOUNTER — Other Ambulatory Visit: Payer: Self-pay

## 2022-10-10 MED ORDER — LISDEXAMFETAMINE DIMESYLATE 50 MG PO CAPS: 50.0000 mg | ORAL_CAPSULE | Freq: Every day | ORAL | 0 refills | Status: DC

## 2022-10-13 ENCOUNTER — Ambulatory Visit: Payer: 59 | Attending: Cardiology | Admitting: Cardiology

## 2022-10-13 ENCOUNTER — Encounter: Payer: Self-pay | Admitting: Cardiology

## 2022-10-13 ENCOUNTER — Ambulatory Visit (INDEPENDENT_AMBULATORY_CARE_PROVIDER_SITE_OTHER): Payer: 59 | Admitting: Radiology

## 2022-10-13 ENCOUNTER — Encounter: Payer: Self-pay | Admitting: Radiology

## 2022-10-13 VITALS — BP 136/88

## 2022-10-13 VITALS — BP 130/84 | HR 72 | Ht 63.0 in | Wt 173.0 lb

## 2022-10-13 DIAGNOSIS — I471 Supraventricular tachycardia, unspecified: Secondary | ICD-10-CM | POA: Diagnosis not present

## 2022-10-13 DIAGNOSIS — I10 Essential (primary) hypertension: Secondary | ICD-10-CM | POA: Diagnosis not present

## 2022-10-13 DIAGNOSIS — N939 Abnormal uterine and vaginal bleeding, unspecified: Secondary | ICD-10-CM | POA: Diagnosis not present

## 2022-10-13 DIAGNOSIS — R42 Dizziness and giddiness: Secondary | ICD-10-CM | POA: Diagnosis not present

## 2022-10-13 DIAGNOSIS — R002 Palpitations: Secondary | ICD-10-CM

## 2022-10-13 DIAGNOSIS — E782 Mixed hyperlipidemia: Secondary | ICD-10-CM | POA: Diagnosis not present

## 2022-10-13 LAB — PREGNANCY, URINE: Preg Test, Ur: NEGATIVE

## 2022-10-13 MED ORDER — NORETHIN-ETH ESTRAD-FE BIPHAS 1 MG-10 MCG / 10 MCG PO TABS
1.0000 | ORAL_TABLET | Freq: Every day | ORAL | 0 refills | Status: DC
Start: 2022-10-13 — End: 2023-01-10

## 2022-10-13 NOTE — Progress Notes (Signed)
  Cardiology Office Note:  .   Date:  10/13/2022  ID:  Michelle Lamb, DOB 19-Jul-1990, MRN [id number] PCP: Marianne Sofia, PA-C  [facility] Providers Cardiologist:  None    History of Present Illness: .   Michelle Lamb is a 31 y.o. female with a past medical history of hypertension, OSA, DM 2, hyperlipidemia, s/p gastric bypass.  Evaluated by Dr. Tomie China on 06/08/2022 for evaluation of chest pain, pain was atypical however stress evaluation was arranged and completed on 06/15/2022 revealing a normal, low risk study, EF 59%.  Her PCP arrange for her to wear a ZIO monitor for dizziness this revealed an average heart rate of 79 bpm, predominant underlying rhythm was sinus, 1 run of SVT occurred.  She presents today for follow-up of palpitations.  Her PCP previously arranged to monitor which was mildly abnormal.  She has also been dealing with endometriosis, was evaluated by her GYN felt this could be contributory as well.  She has had some episodes of dizziness as well, will check her blood sugar if this occurs it work and has noticed that it has been low.  Her PCP stopped her Ozempic after this and this seems to have subsided somewhat.  She denies chest pain, dyspnea, pnd, orthopnea, n, v, syncope, edema, weight gain, or early satiety.   ROS: Review of Systems  Constitutional: Negative.   HENT: Negative.    Eyes: Negative.   Respiratory: Negative.    Cardiovascular:  Positive for palpitations.  Gastrointestinal: Negative.   Genitourinary: Negative.   Musculoskeletal: Negative.   Skin: Negative.   Neurological:  Positive for dizziness.  Endo/Heme/Allergies: Negative.   Psychiatric/Behavioral:  The patient is nervous/anxious (reports anxiety about her health).      Studies Reviewed: .         Risk Assessment/Calculations:             Physical Exam:   VS:  BP 130/84 (BP Location: Left Arm, Patient Position: Sitting, Cuff Size: Normal)   Pulse 72   Ht 5\' 3"  (1.6  m)   Wt 173 lb (78.5 kg)   SpO2 98%   BMI 30.65 kg/m    Wt Readings from Last 3 Encounters:  10/13/22 173 lb (78.5 kg)  09/28/22 174 lb (78.9 kg)  08/25/22 177 lb (80.3 kg)    GEN: Well nourished, well developed in no acute distress NECK: No JVD; No carotid bruits CARDIAC: RRR, no murmurs, rubs, gallops RESPIRATORY:  Clear to auscultation without rales, wheezing or rhonchi  ABDOMEN: Soft, non-tender, non-distended EXTREMITIES:  No edema; No deformity   ASSESSMENT AND PLAN: .   Palpitations/SVT - monitor revealed isolated event os SVT, we discussed that this is not a dangerous rhythm but if it was overly bothersome to her we could start her on a low dose beta blocker. She politely declines, wants to just watch for now. If symptoms persist/worsen, she will reach out and let us know. If needed, could start propranolol which may help with her anxiety as well. Hypertension-blood pressure is controlled at 130/80, currently not on any antihypertensive agents        Dispo: Follow-up as needed  Signed, Flossie Dibble, NP

## 2022-10-13 NOTE — Patient Instructions (Signed)
Medication Instructions:  Your physician recommends that you continue on your current medications as directed. Please refer to the Current Medication list given to you today.  *If you need a refill on your cardiac medications before your next appointment, please call your pharmacy*   Lab Work: None ordered   If you have labs (blood work) drawn today and your tests are completely normal, you will receive your results only by: MyChart Message (if you have MyChart) OR A paper copy in the mail If you have any lab test that is abnormal or we need to change your treatment, we will call you to review the results.   Testing/Procedures: None ordered    Follow-Up: Follow up as needed   Other Instructions

## 2022-10-13 NOTE — Progress Notes (Signed)
   Michelle Lamb 02/16/1991 [id number]   History:  32 y.o. G7P4 hx of anemia and adenomyosis, c/o BTB, very heavy periods (changing super pads every 2 hours), severe dysmenorrhea on Junel Fe 24. Hx iron infusion (last infusion 8 months ago). No missed or late pills. CBC reassuring last month at PCP.  Gynecologic History Patient's last menstrual period was 10/13/2022 (exact date).     Obstetric History OB History  Gravida Para Term Preterm AB Living  7 4 3   1 4   SAB IAB Ectopic Multiple Live Births  1       4    # Outcome Date GA Lbr Len/2nd Weight Sex Type Anes PTL Lv  7 Term 02/12/14 [redacted]w[redacted]d  6 lb 12 oz (3.062 kg) F Vag-Spont EPI N LIV  6 SAB 2015          5 Term 08/06/08 [redacted]w[redacted]d  6 lb 5 oz (2.863 kg) F Vag-Spont EPI N LIV  4 Term 04/02/07 [redacted]w[redacted]d  5 lb 5 oz (2.41 kg) F Vag-Spont EPI Y LIV     Complications: Preeclampsia, Diabetes mellitus complicating pregnancy  3 Para           2 Gravida           1 Gravida             Obstetric Comments  H/o GDM and HTN in her last pregnancy     The following portions of the patient's history were reviewed and updated as appropriate: allergies, current medications, past family history, past medical history, past social history, past surgical history, and problem list.  Review of Systems Pertinent items noted in HPI and remainder of comprehensive ROS otherwise negative.   Past medical history, past surgical history, family history and social history were all reviewed and documented in the EPIC chart.   Exam:  Vitals:   10/13/22 0926  BP: 136/88   There is no height or weight on file to calculate BMI.  Physical Exam Exam conducted with a chaperone present.  Constitutional:      Appearance: She is normal weight.  Pulmonary:     Effort: Pulmonary effort is normal.  Genitourinary:    General: Normal vulva.     Vagina: Bleeding present.     Cervix: Normal.     Uterus: Enlarged.      Adnexa: Right adnexa normal and left  adnexa normal.  Neurological:     Mental Status: She is alert.  Psychiatric:        Mood and Affect: Mood normal.        Thought Content: Thought content normal.        Judgment: Judgment normal.    Raynelle Fanning, CMA present for exam  Assessment/Plan:   1. Abnormal vaginal bleeding/Adenomyosis Will change to LoLoestrin and have patient taking continuously while she waits for surgical consult TF:TDDU  - Pregnancy, urine;neg - Norethindrone-Ethinyl Estradiol-Fe Biphas (LO LOESTRIN FE) 1 MG-10 MCG / 10 MCG tablet; Take 1 tablet by mouth daily. Skip placebos take continuously  Dispense: 84 tablet; Refill: 0   ,  B WHNP-BC 10:32 AM 10/13/2022

## 2022-10-18 ENCOUNTER — Other Ambulatory Visit: Payer: Self-pay

## 2022-10-18 DIAGNOSIS — F418 Other specified anxiety disorders: Secondary | ICD-10-CM

## 2022-10-18 MED ORDER — LORAZEPAM 1 MG PO TABS: 1.0000 mg | ORAL_TABLET | Freq: Every day | ORAL | 1 refills | Status: DC | PRN

## 2022-10-19 ENCOUNTER — Ambulatory Visit (INDEPENDENT_AMBULATORY_CARE_PROVIDER_SITE_OTHER): Payer: 59 | Admitting: Physician Assistant

## 2022-10-19 ENCOUNTER — Encounter: Payer: Self-pay | Admitting: Physician Assistant

## 2022-10-19 VITALS — BP 138/78 | HR 88 | Temp 97.3°F | Ht 63.0 in | Wt 173.0 lb

## 2022-10-19 DIAGNOSIS — N921 Excessive and frequent menstruation with irregular cycle: Secondary | ICD-10-CM

## 2022-10-19 DIAGNOSIS — F418 Other specified anxiety disorders: Secondary | ICD-10-CM | POA: Diagnosis not present

## 2022-10-19 MED ORDER — VENLAFAXINE HCL ER 75 MG PO CP24
75.0000 mg | ORAL_CAPSULE | Freq: Every day | ORAL | Status: DC
Start: 2022-10-19 — End: 2022-10-26

## 2022-10-19 NOTE — Progress Notes (Signed)
Subjective:  Patient ID: Michelle Lamb, female    DOB: September 15, 1990  Age: 32 y.o. MRN: [id number]  Chief Complaint  Patient presents with   Depression    HPI Pt in for follow up of anxiety with depression - she started effexor 37.5mg  qd a few weeks ago and states overall it has seemed to help.  She is having minimal breakthrough anxiety symptoms -   Pt is currently following with [facility] for menorrhagia  - has appt on 9/13 for consult for partial hysterectomy    10/19/2022    4:23 PM 08/22/2022    3:31 PM 11/30/2021   11:14 AM 09/08/2021    9:57 AM 02/08/2021    4:50 PM  Depression screen PHQ 2/9  Decreased Interest 1 1 0 0 0  Down, Depressed, Hopeless 1 2 1  0 1  PHQ - 2 Score 2 3 1  0 1  Altered sleeping 2 3 0 0 2  Tired, decreased energy 1 1 2  0 2  Change in appetite 1  0 0 0  Feeling bad or failure about yourself   0 0 0 0  Trouble concentrating 0 3 3 0 0  Moving slowly or fidgety/restless 0 0 0 0 0  Suicidal thoughts 0 0 0 0 0  PHQ-9 Score 6 10 6  0 5  Difficult doing work/chores Somewhat difficult  Somewhat difficult Not difficult at all Somewhat difficult        07/18/2021    8:00 AM 09/10/2021    3:36 PM 09/22/2021    9:47 AM 09/24/2021    2:00 PM 11/11/2021    9:15 AM  Fall Risk  (RETIRED) Patient Fall Risk Level Low fall risk Low fall risk Low fall risk Low fall risk Low fall risk     ROS CONSTITUTIONAL: Negative for chills, fatigue, fever,   CARDIOVASCULAR: Negative for chest pain,  RESPIRATORY: Negative for recent cough and dyspnea.  GU - see HPI PSYCHIATRIC:see HPI   Current Outpatient Medications:    venlafaxine XR (EFFEXOR XR) 75 MG 24 hr capsule, Take 1 capsule (75 mg total) by mouth daily with breakfast., Disp: , Rfl:    albuterol (VENTOLIN HFA) 108 (90 Base) MCG/ACT inhaler, Inhale 2 puffs into the lungs every 6 (six) hours as needed for wheezing or shortness of breath., Disp: 1 each, Rfl: 5   cyanocobalamin (VITAMIN B12) 1000 MCG/ML  injection, Inject 1 ML as directed once a month, Disp: 1 mL, Rfl: 2   fluticasone (FLONASE) 50 MCG/ACT nasal spray, Place 2 sprays into both nostrils daily. (Patient taking differently: Place 2 sprays into both nostrils as needed for allergies or rhinitis.), Disp: 16 g, Rfl: 6   lisdexamfetamine (VYVANSE) 50 MG capsule, Take 1 capsule (50 mg total) by mouth daily., Disp: 30 capsule, Rfl: 0   LORazepam (ATIVAN) 1 MG tablet, Take 1 tablet (1 mg total) by mouth daily as needed., Disp: 30 tablet, Rfl: 1   Norethindrone-Ethinyl Estradiol-Fe Biphas (LO LOESTRIN FE) 1 MG-10 MCG / 10 MCG tablet, Take 1 tablet by mouth daily. Skip placebos take continuously, Disp: 84 tablet, Rfl: 0   omeprazole (PRILOSEC) 20 MG capsule, TAKE 1 CAPSULE TWICE DAILY (Patient taking differently: Take 20 mg by mouth daily.), Disp: 180 capsule, Rfl: 1   ondansetron (ZOFRAN-ODT) 4 MG disintegrating tablet, Take 1 tablet (4 mg total) by mouth every 8 (eight) hours as needed for nausea or vomiting., Disp: 90 tablet, Rfl: 1   polyethylene glycol (MIRALAX / GLYCOLAX) 17  g packet, Take 17 g by mouth 2 (two) times daily., Disp: , Rfl:    Vitamin D, Ergocalciferol, (DRISDOL) 1.25 MG (50000 UNIT) CAPS capsule, Take 50,000 Units by mouth every 7 (seven) days., Disp: , Rfl:   Past Medical History:  Diagnosis Date   Abnormal menses 08/22/2022   Absolute anemia 10/12/2021   Acute cystitis with hematuria 12/22/2020   Acute laryngopharyngitis 09/08/2021   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  Report date is 02/23/2022.      The CancerNext-Expanded gene panel offered by [facility] and includes sequencing, rearrangement, and RNA analysis for the following 77 genes: AIP, ALK, APC, ATM, AXIN2, BAP1, BARD1, BLM, BMPR1A, BRCA1, BRCA2, BRIP1, CDC73, CDH1, CDK4, CDKN1B, CDKN2A, CHEK2, CTNNA1, DICER1, FANCC, FH, FLCN, GALNT12, KIF1B, LZTR1,   Hepatic steatosis 09/08/2021   History of herpes simplex infection    History of partial nephrectomy 03/21/2022   Formatting of this note might be different from the original. 03/15/2021: Underwent right partial nephrectomy by Dr. Laverle Patter for right renal neoplasm. Pathology: Angiomyolipoma. Formatting of this note might be different from the original. 03/15/2021: Underwent right partial nephrectomy by Dr. Laverle Patter for right renal neoplasm. Pathology: Angiomyolipoma.   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   Morbid obesity (HCC) 06/02/2020   Neoplasm of right kidney    benign tumor of kidney   Obesity (BMI 30.0-34.9) 06/08/2022   Obstructive sleep apnea 10/29/2019   Racing heart beat 08/22/2022   Seasonal allergies    Ureteral obstruction, right 07/15/2021   Vaginal delivery 2009, 2010, 2015   Viral disease 03/02/2022   Vitamin D deficiency     Vitamin D insufficiency 09/30/2020   Weight loss 09/30/2020   Objective:  PHYSICAL EXAM:   BP 138/78 (BP Location: Right Arm, Patient Position: Sitting, Cuff Size: Large)   Pulse 88   Temp (!) 97.3 F (36.3 C)   Ht 5\' 3"  (1.6 m)   Wt 173 lb (78.5 kg)   LMP 10/13/2022 (Exact Date)   SpO2 98%   BMI 30.65 kg/m    GEN: Well nourished, well developed, in no acute distress   Cardiac: RRR; no murmurs, Respiratory:  normal respiratory rate and pattern with no distress - normal breath sounds with no rales, rhonchi, wheezes or rubs  Psych: euthymic mood, appropriate affect and  demeanor  Assessment & Plan:   Depression with anxiety -     Venlafaxine HCl ER; Take 1 capsule (75 mg total) by mouth daily with breakfast.  Menorrhagia with irregular cycle    Follow up with GYN  Follow-up: Return for for next chronic fasting visit.  An After Visit Summary was printed and given to the patient.  Jettie Pagan [facility] [phone]

## 2022-10-20 ENCOUNTER — Other Ambulatory Visit: Payer: Self-pay | Admitting: Physician Assistant

## 2022-10-20 DIAGNOSIS — M545 Low back pain, unspecified: Secondary | ICD-10-CM

## 2022-10-20 MED ORDER — HYDROCODONE-ACETAMINOPHEN 5-325 MG PO TABS: 1.0000 | ORAL_TABLET | Freq: Four times a day (QID) | ORAL | 0 refills | Status: DC | PRN

## 2022-10-25 DIAGNOSIS — E119 Type 2 diabetes mellitus without complications: Secondary | ICD-10-CM | POA: Diagnosis not present

## 2022-10-25 DIAGNOSIS — Z6830 Body mass index (BMI) 30.0-30.9, adult: Secondary | ICD-10-CM | POA: Diagnosis not present

## 2022-10-25 DIAGNOSIS — G4733 Obstructive sleep apnea (adult) (pediatric): Secondary | ICD-10-CM | POA: Diagnosis not present

## 2022-10-25 DIAGNOSIS — K909 Intestinal malabsorption, unspecified: Secondary | ICD-10-CM | POA: Diagnosis not present

## 2022-10-25 DIAGNOSIS — Z13220 Encounter for screening for lipoid disorders: Secondary | ICD-10-CM | POA: Diagnosis not present

## 2022-10-25 DIAGNOSIS — E559 Vitamin D deficiency, unspecified: Secondary | ICD-10-CM | POA: Diagnosis not present

## 2022-10-25 DIAGNOSIS — Z9884 Bariatric surgery status: Secondary | ICD-10-CM | POA: Diagnosis not present

## 2022-10-25 DIAGNOSIS — E669 Obesity, unspecified: Secondary | ICD-10-CM | POA: Diagnosis not present

## 2022-10-25 DIAGNOSIS — E782 Mixed hyperlipidemia: Secondary | ICD-10-CM | POA: Diagnosis not present

## 2022-10-25 DIAGNOSIS — I10 Essential (primary) hypertension: Secondary | ICD-10-CM | POA: Diagnosis not present

## 2022-10-26 ENCOUNTER — Other Ambulatory Visit: Payer: Self-pay

## 2022-10-26 DIAGNOSIS — F418 Other specified anxiety disorders: Secondary | ICD-10-CM

## 2022-10-26 MED ORDER — VENLAFAXINE HCL ER 75 MG PO CP24: 75.0000 mg | ORAL_CAPSULE | Freq: Every day | ORAL | Status: DC

## 2022-11-01 ENCOUNTER — Other Ambulatory Visit: Payer: Self-pay

## 2022-11-01 DIAGNOSIS — E6609 Other obesity due to excess calories: Secondary | ICD-10-CM

## 2022-11-01 DIAGNOSIS — E119 Type 2 diabetes mellitus without complications: Secondary | ICD-10-CM | POA: Diagnosis not present

## 2022-11-01 DIAGNOSIS — K909 Intestinal malabsorption, unspecified: Secondary | ICD-10-CM | POA: Diagnosis not present

## 2022-11-01 DIAGNOSIS — Z9884 Bariatric surgery status: Secondary | ICD-10-CM

## 2022-11-01 DIAGNOSIS — E559 Vitamin D deficiency, unspecified: Secondary | ICD-10-CM

## 2022-11-02 ENCOUNTER — Other Ambulatory Visit: Payer: Self-pay

## 2022-11-02 ENCOUNTER — Other Ambulatory Visit: Payer: Self-pay | Admitting: Physician Assistant

## 2022-11-02 DIAGNOSIS — F418 Other specified anxiety disorders: Secondary | ICD-10-CM

## 2022-11-02 MED ORDER — VENLAFAXINE HCL ER 75 MG PO CP24
75.0000 mg | ORAL_CAPSULE | Freq: Every day | ORAL | 2 refills | Status: DC
Start: 2022-11-02 — End: 2022-11-02

## 2022-11-02 MED ORDER — VENLAFAXINE HCL ER 75 MG PO CP24: 75.0000 mg | ORAL_CAPSULE | Freq: Every day | ORAL | 2 refills | Status: DC

## 2022-11-07 LAB — COMPREHENSIVE METABOLIC PANEL
ALT: 18 IU/L (ref 0–32)
AST: 15 IU/L (ref 0–40)
Albumin: 4.1 g/dL (ref 3.9–4.9)
Alkaline Phosphatase: 78 IU/L (ref 44–121)
BUN/Creatinine Ratio: 23 (ref 9–23)
BUN: 13 mg/dL (ref 6–20)
Bilirubin Total: 0.8 mg/dL (ref 0.0–1.2)
CO2: 22 mmol/L (ref 20–29)
Calcium: 9 mg/dL (ref 8.7–10.2)
Chloride: 104 mmol/L (ref 96–106)
Creatinine, Ser: 0.57 mg/dL (ref 0.57–1.00)
Globulin, Total: 2.1 g/dL (ref 1.5–4.5)
Glucose: 79 mg/dL (ref 70–99)
Potassium: 3.9 mmol/L (ref 3.5–5.2)
Sodium: 140 mmol/L (ref 134–144)
Total Protein: 6.2 g/dL (ref 6.0–8.5)
eGFR: 123 mL/min/{1.73_m2} (ref >59)

## 2022-11-07 LAB — VITAMIN B1: Thiamine: 92.7 nmol/L (ref 66.5–200.0)

## 2022-11-07 LAB — VITAMIN B12: Vitamin B-12: 237 pg/mL (ref 232–1245)

## 2022-11-07 LAB — INSULIN, RANDOM: INSULIN: 9.1 u[IU]/mL (ref 2.6–24.9)

## 2022-11-07 LAB — VITAMIN A: Vitamin A: 19.1 ug/dL (ref 18.9–57.3)

## 2022-11-07 LAB — VITAMIN D 25 HYDROXY (VIT D DEFICIENCY, FRACTURES): Vit D, 25-Hydroxy: 19.3 ng/mL — ABNORMAL LOW (ref 30.0–100.0)

## 2022-11-07 LAB — ZINC: Zinc: 56 ug/dL (ref 44–115)

## 2022-11-10 ENCOUNTER — Other Ambulatory Visit: Payer: Self-pay | Admitting: Physician Assistant

## 2022-11-10 MED ORDER — LISDEXAMFETAMINE DIMESYLATE 50 MG PO CAPS: 50.0000 mg | ORAL_CAPSULE | Freq: Every day | ORAL | 0 refills | Status: DC

## 2022-11-14 ENCOUNTER — Other Ambulatory Visit: Payer: Self-pay

## 2022-11-14 ENCOUNTER — Other Ambulatory Visit (HOSPITAL_COMMUNITY): Payer: Self-pay

## 2022-11-14 ENCOUNTER — Encounter: Payer: Self-pay | Admitting: Obstetrics and Gynecology

## 2022-11-14 ENCOUNTER — Other Ambulatory Visit: Payer: Self-pay | Admitting: Physician Assistant

## 2022-11-14 ENCOUNTER — Ambulatory Visit (INDEPENDENT_AMBULATORY_CARE_PROVIDER_SITE_OTHER): Payer: 59 | Admitting: Obstetrics and Gynecology

## 2022-11-14 VITALS — BP 120/82 | HR 74

## 2022-11-14 DIAGNOSIS — D500 Iron deficiency anemia secondary to blood loss (chronic): Secondary | ICD-10-CM | POA: Diagnosis not present

## 2022-11-14 DIAGNOSIS — N946 Dysmenorrhea, unspecified: Secondary | ICD-10-CM

## 2022-11-14 DIAGNOSIS — N921 Excessive and frequent menstruation with irregular cycle: Secondary | ICD-10-CM | POA: Diagnosis not present

## 2022-11-14 DIAGNOSIS — N8003 Adenomyosis of the uterus: Secondary | ICD-10-CM | POA: Diagnosis not present

## 2022-11-14 MED ORDER — LISDEXAMFETAMINE DIMESYLATE 50 MG PO CAPS
50.0000 mg | ORAL_CAPSULE | Freq: Every day | ORAL | 0 refills | Status: DC
  Filled 2022-11-14 (×2): qty 30, 30d supply, fill #0

## 2022-11-14 NOTE — Progress Notes (Signed)
Michelle Lamb 08-24-1990 [id number]   History:  32 y.o. G7P4  with 9 kids at her house.  Patient with known known anemia and adenomyosis here today for surgical consult for [facility].  Patient with c/o BTB, very heavy periods (changing super pads every 2 hours), severe dysmenorrhea on Junel Fe 24. Hx iron infusion (last infusion 8 months ago). No missed or late pills. CBC reassuring last month at PCP. She is on continuous ocp's this month and is still having the irregular bleeding.  She is frustrated with the bleeding and would like a definitive management.  She does not desires anymore children.    Gynecologic History Patient's last menstrual period was 10/13/2022. Period Duration (Days): 7 Period Pattern: (!) Irregular Menstrual Flow: Heavy, Moderate Menstrual Control: Maxi pad Dysmenorrhea: (!) Severe Dysmenorrhea Symptoms: Cramping, Nausea, Headache, Other (Comment) (dizziness)  Obstetric History OB History  Gravida Para Term Preterm AB Living  7 4 3   1 4   SAB IAB Ectopic Multiple Live Births  1       4    # Outcome Date GA Lbr Len/2nd Weight Sex Type Anes PTL Lv  7 Term 02/12/14 [redacted]w[redacted]d  6 lb 12 oz (3.062 kg) F Vag-Spont EPI N LIV  6 SAB 2015          5 Term 08/06/08 [redacted]w[redacted]d  6 lb 5 oz (2.863 kg) F Vag-Spont EPI N LIV  4 Term 04/02/07 [redacted]w[redacted]d  5 lb 5 oz (2.41 kg) F Vag-Spont EPI Y LIV     Complications: Preeclampsia, Diabetes mellitus complicating pregnancy  3 Para           2 Gravida           1 Gravida             Obstetric Comments  H/o GDM and HTN in her last pregnancy     Review of Systems Pertinent items noted in HPI and remainder of comprehensive ROS otherwise negative.   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  Report date is 02/23/2022.      The CancerNext-Expanded gene panel offered by [facility] and includes sequencing, rearrangement, and RNA analysis for the following 77 genes: AIP, ALK, APC, ATM, AXIN2, BAP1, BARD1, BLM, BMPR1A, BRCA1, BRCA2, BRIP1, CDC73, CDH1, CDK4, CDKN1B, CDKN2A, CHEK2, CTNNA1, DICER1, FANCC, FH, FLCN, GALNT12, KIF1B, LZTR1,   Hepatic steatosis 09/08/2021   History of herpes simplex infection    History of partial nephrectomy 03/21/2022   Formatting of this  note might be different from the original. 03/15/2021: Underwent right partial nephrectomy by Dr. Laverle Patter for right renal neoplasm. Pathology: Angiomyolipoma. Formatting of this note might be different from the original. 03/15/2021: Underwent right partial nephrectomy by Dr. Laverle Patter for right renal neoplasm. Pathology: Angiomyolipoma.   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   Morbid obesity (HCC) 06/02/2020   Neoplasm of right kidney    benign tumor of kidney   Obesity (BMI 30.0-34.9) 06/08/2022   Obstructive sleep apnea 10/29/2019   Racing heart beat 08/22/2022   Seasonal allergies    Ureteral obstruction, right 07/15/2021   Vaginal delivery 2009, 2010, 2015   Viral disease 03/02/2022   Vitamin D deficiency    Vitamin D insufficiency 09/30/2020   Weight loss 09/30/2020   Past Surgical History:  Procedure Laterality Date   CESAREAN SECTION  09/29/2016   CHOLECYSTECTOMY     COLONOSCOPY WITH ESOPHAGOGASTRODUODENOSCOPY (EGD)  07/22/2022   CYSTOSCOPY W/ URETERAL STENT PLACEMENT Right 07/15/2021   Procedure: CYSTOSCOPY WITH RETROGRADE PYELOGRAM/URETERAL STENT PLACEMENT;  Surgeon: Heloise Purpura, MD;  Location: [facility];  Service: Urology;  Laterality: Right;   CYSTOSCOPY WITH RETROGRADE PYELOGRAM, URETEROSCOPY AND STENT PLACEMENT Right 05/03/2021   Procedure: CYSTOSCOPY WITH RIGHT RETROGRADE PYELOGRAM, URETEROSCOPY;  Surgeon: Heloise Purpura, MD;  Location: [facility];  Service: Urology;  Laterality: Right;   DILATION AND CURETTAGE OF UTERUS N/A 12/31/2015   Procedure: SUCTION DILATATION AND CURETTAGE;  Surgeon: [place] Bing, MD;  Location: [facility];  Service: Gynecology;  Laterality: N/A;   DILATION AND EVACUATION N/A 01/01/2016   Procedure: DILATATION AND EVACUATION;  Surgeon: Tilda Burrow, MD;  Location: [facility];  Service: Gynecology;  Laterality: N/A;   ESOPHAGOGASTRODUODENOSCOPY     GASTRIC BYPASS  06/02/2020   IR NEPHRO TUBE REMOV/FL  07/16/2021   IR NEPHROSTOMY EXCHANGE RIGHT  05/12/2021   IR NEPHROSTOMY PLACEMENT RIGHT  05/05/2021   ROBOT ASSISTED PYELOPLASTY Right 07/15/2021   Procedure: XI ROBOTIC ASSISTED  LAPAROSCOPIC PYELOPLASTY;  Surgeon: Heloise Purpura, MD;  Location: [facility];  Service: Urology;  Laterality: Right;   ROBOTIC ASSITED PARTIAL NEPHRECTOMY Right 03/15/2021   Procedure: XI ROBOTIC ASSITED PARTIAL  NEPHRECTOMY;  Surgeon: Heloise Purpura, MD;  Location: [facility];  Service: Urology;  Laterality: Right;   TONSILLECTOMY     TUBAL LIGATION     UPPER GASTROINTESTINAL ENDOSCOPY     Social History   Socioeconomic History   Marital status: Married    Spouse name: Not on file   Number of children: 9   Years of education: 13   Highest education level: Associate degree: occupational, Scientist, product/process development, or vocational program  Occupational History   Occupation: Scientist, forensic  Tobacco Use   Smoking status: Never   Smokeless tobacco: Never  Vaping Use   Vaping status: Never Used  Substance and Sexual Activity   Alcohol use: Not Currently   Drug use: Never   Sexual activity: Yes    Partners: Male    Birth control/protection: Surgical    Comment: tubal  Other Topics Concern   Not on file  Social History Narrative   Not on file   Social  Determinants of Health   Financial Resource Strain: Low Risk  (08/21/2022)   Overall Financial Resource Strain (CARDIA)    Difficulty of Paying Living Expenses: Not hard at all  Food Insecurity: No Food Insecurity (08/21/2022)   Hunger Vital Sign    Worried About Running Out of Food in the Last Year: Never true    Ran Out of Food in the Last Year: Never true  Transportation Needs: No Transportation Needs (08/21/2022)   PRAPARE - Administrator, Civil Service (Medical): No    Lack of Transportation (Non-Medical): No  Physical Activity: Insufficiently Active (08/21/2022)   Exercise Vital Sign    Days of Exercise per Week: 3 days    Minutes of Exercise per Session: 40 min  Stress: Stress Concern Present (08/21/2022)   [employer] of Occupational Health - Occupational Stress Questionnaire    Feeling of Stress : To some extent  Social Connections: Socially Integrated (08/21/2022)   Social Connection and Isolation Panel [NHANES]    Frequency of Communication with Friends and Family: More than three times a week    Frequency of Social  Gatherings with Friends and Family: More than three times a week    Attends Religious Services: 1 to 4 times per year    Active Member of [employer] or Organizations: Yes    Attends Engineer, structural: More than 4 times per year    Marital Status: Married   No Known Allergies    Exam:  Vitals:   11/14/22 1056  BP: 120/82  Pulse: 74  SpO2: 99%     Assessment/Plan:  adenomyosis, menorrhagia, anemia, failed hormonal management desires definitive management with the [facility]  Symptomatic fibroid uterus:  Counseled on all options.  She would like to have the [facility].  Counseled extensively on the procedure including but not limited to what to expect and risks and benefits.  Counseled on postop care and pelvic rest for 10 weeks after the surgery with restricted lifting for 6 weeks after.  Counseled on the benefits of the robotic procedure with faster return to daily activities, improved outcomes, and less risk for complications.  Counseled on the importance of ovarian preservaiton. She would like to have this scheduled.  Earley Favor WHNP-BC 11:38 AM 11/14/2022  30 minutes spent on reviewing records, imaging,  and one on one patient time and counseling patient and documentation Dr. Karma Greaser

## 2022-11-24 ENCOUNTER — Ambulatory Visit (INDEPENDENT_AMBULATORY_CARE_PROVIDER_SITE_OTHER): Payer: 59

## 2022-11-24 ENCOUNTER — Encounter: Payer: Self-pay | Admitting: Obstetrics and Gynecology

## 2022-11-24 DIAGNOSIS — Z23 Encounter for immunization: Secondary | ICD-10-CM

## 2022-11-28 ENCOUNTER — Other Ambulatory Visit: Payer: Self-pay

## 2022-11-28 MED ORDER — HYDROCODONE-ACETAMINOPHEN 5-325 MG PO TABS: 1.0000 | ORAL_TABLET | Freq: Four times a day (QID) | ORAL | 0 refills | Status: DC | PRN

## 2022-11-29 NOTE — Telephone Encounter (Signed)
See Surgery referral notes.   Encounter closed.

## 2022-11-29 NOTE — Telephone Encounter (Signed)
Had consult with Karma Greaser already for hyst.

## 2022-12-01 ENCOUNTER — Other Ambulatory Visit: Payer: Self-pay | Admitting: Physician Assistant

## 2022-12-01 DIAGNOSIS — F418 Other specified anxiety disorders: Secondary | ICD-10-CM

## 2022-12-01 MED ORDER — VENLAFAXINE HCL ER 75 MG PO CP24
75.0000 mg | ORAL_CAPSULE | Freq: Every day | ORAL | 2 refills | Status: DC
Start: 2022-12-01 — End: 2022-12-20

## 2022-12-02 DIAGNOSIS — S52322A Displaced transverse fracture of shaft of left radius, initial encounter for closed fracture: Secondary | ICD-10-CM | POA: Diagnosis not present

## 2022-12-05 DIAGNOSIS — R1319 Other dysphagia: Secondary | ICD-10-CM | POA: Diagnosis not present

## 2022-12-05 DIAGNOSIS — K5904 Chronic idiopathic constipation: Secondary | ICD-10-CM | POA: Diagnosis not present

## 2022-12-05 DIAGNOSIS — R1084 Generalized abdominal pain: Secondary | ICD-10-CM | POA: Diagnosis not present

## 2022-12-05 DIAGNOSIS — K219 Gastro-esophageal reflux disease without esophagitis: Secondary | ICD-10-CM | POA: Diagnosis not present

## 2022-12-07 ENCOUNTER — Encounter: Payer: Self-pay | Admitting: Physician Assistant

## 2022-12-07 ENCOUNTER — Ambulatory Visit (INDEPENDENT_AMBULATORY_CARE_PROVIDER_SITE_OTHER): Payer: 59 | Admitting: Physician Assistant

## 2022-12-07 VITALS — BP 138/98 | HR 91 | Temp 97.2°F | Ht 63.0 in | Wt 170.0 lb

## 2022-12-07 DIAGNOSIS — R42 Dizziness and giddiness: Secondary | ICD-10-CM | POA: Diagnosis not present

## 2022-12-07 DIAGNOSIS — R5383 Other fatigue: Secondary | ICD-10-CM | POA: Diagnosis not present

## 2022-12-07 MED ORDER — MECLIZINE HCL 25 MG PO TABS: 25.0000 mg | ORAL_TABLET | Freq: Three times a day (TID) | ORAL | 0 refills | Status: AC | PRN

## 2022-12-07 NOTE — Progress Notes (Signed)
Subjective:  Patient ID: Michelle Lamb, female    DOB: [birth year]  Age: 32 y.o. MRN: [id number]  Chief Complaint  Patient presents with   Dizziness    HPI Pt states that on Monday she started having periodic dizziness and feeling tired.  She does mention she had some pressure in her chest that day associated with some stress/anxiety (which she states happens occasionally - has had recent cardiology workup) - she went home and went to bed.  On Tuesday woke up dizzy and then after lunch it seemed to worsen - now noted worse with turning quickly and closing eyes.  She still has some fatigue and dizziness this morning. She does have menorrhagia and is scheduled to have a partial hysterectomy later this month.  She is not currently bleeding but had very heavy bleeding last month.  She has a history of iron def anemia and has not had labwork to include cbc or iron studies in a few months     10/19/2022    4:23 PM 08/22/2022    3:31 PM 11/30/2021   11:14 AM 09/08/2021    9:57 AM 02/08/2021    4:50 PM  Depression screen PHQ 2/9  Decreased Interest 1 1 0 0 0  Down, Depressed, Hopeless 1 2 1  0 1  PHQ - 2 Score 2 3 1  0 1  Altered sleeping 2 3 0 0 2  Tired, decreased energy 1 1 2  0 2  Change in appetite 1  0 0 0  Feeling bad or failure about yourself   0 0 0 0  Trouble concentrating 0 3 3 0 0  Moving slowly or fidgety/restless 0 0 0 0 0  Suicidal thoughts 0 0 0 0 0  PHQ-9 Score 6 10 6  0 5  Difficult doing work/chores Somewhat difficult  Somewhat difficult Not difficult at all Somewhat difficult        09/10/2021    3:36 PM 09/22/2021    9:47 AM 09/24/2021    2:00 PM 11/11/2021    9:15 AM 11/14/2022   10:55 AM  Fall Risk  Falls in the past year?     0  Was there an injury with Fall?     0  Fall Risk Category Calculator     0  (RETIRED) Patient Fall Risk Level Low fall risk Low fall risk Low fall risk Low fall risk   Patient at Risk for Falls Due to     No Fall Risks  Fall risk  Follow up     Falls evaluation completed     ROS CONSTITUTIONAL: see HPI E/N/T: Negative for ear pain, nasal congestion and sore throat.  CARDIOVASCULAR: see HPI RESPIRATORY: Negative for recent cough and dyspnea.  GASTROINTESTINAL: Negative for abdominal pain, acid reflux symptoms, constipation, diarrhea, nausea and vomiting.  MSK: Negative for arthralgias and myalgias.  INTEGUMENTARY: Negative for rash.  NEUROLOGICAL: see HPI PSYCHIATRIC: Negative for sleep disturbance and to question depression screen.  Negative for depression, negative for anhedonia.    Current Outpatient Medications:    linaclotide (LINZESS) 145 MCG CAPS capsule, Take 145 mcg by mouth daily before breakfast., Disp: , Rfl:    meclizine (ANTIVERT) 25 MG tablet, Take 1 tablet (25 mg total) by mouth 3 (three) times daily as needed for dizziness., Disp: 30 tablet, Rfl: 0   pantoprazole (PROTONIX) 40 MG tablet, Take 40 mg by mouth daily., Disp: , Rfl:    albuterol (VENTOLIN HFA) 108 (90 Base) MCG/ACT inhaler, Inhale  2 puffs into the lungs every 6 (six) hours as needed for wheezing or shortness of breath., Disp: 1 each, Rfl: 5   cyanocobalamin (VITAMIN B12) 1000 MCG/ML injection, Inject 1 ML as directed once a month, Disp: 1 mL, Rfl: 2   fluticasone (FLONASE) 50 MCG/ACT nasal spray, Place 2 sprays into both nostrils daily. (Patient taking differently: Place 2 sprays into both nostrils as needed for allergies or rhinitis.), Disp: 16 g, Rfl: 6   lisdexamfetamine (VYVANSE) 50 MG capsule, Take 1 capsule (50 mg total) by mouth daily., Disp: 30 capsule, Rfl: 0   LORazepam (ATIVAN) 1 MG tablet, Take 1 tablet (1 mg total) by mouth daily as needed., Disp: 30 tablet, Rfl: 1   Norethindrone-Ethinyl Estradiol-Fe Biphas (LO LOESTRIN FE) 1 MG-10 MCG / 10 MCG tablet, Take 1 tablet by mouth daily. Skip placebos take continuously, Disp: 84 tablet, Rfl: 0   ondansetron (ZOFRAN-ODT) 4 MG disintegrating tablet, Take 1 tablet (4 mg total) by mouth  every 8 (eight) hours as needed for nausea or vomiting., Disp: 90 tablet, Rfl: 1   venlafaxine XR (EFFEXOR XR) 75 MG 24 hr capsule, Take 1 capsule (75 mg total) by mouth daily with breakfast., Disp: 30 capsule, Rfl: 2   Vitamin D, Ergocalciferol, (DRISDOL) 1.25 MG (50000 UNIT) CAPS capsule, Take 50,000 Units by mouth 2 (two) times a week., Disp: , Rfl:   Past Medical History:  Diagnosis Date   Abnormal menses 08/22/2022   Absolute anemia 10/12/2021   Acute cystitis with hematuria 12/22/2020   Acute laryngopharyngitis 09/08/2021   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  Report date is 02/23/2022.      The CancerNext-Expanded gene panel offered by [facility] and includes sequencing, rearrangement, and RNA analysis for the following 77 genes: AIP, ALK, APC, ATM, AXIN2, BAP1, BARD1, BLM, BMPR1A, BRCA1, BRCA2, BRIP1, CDC73, CDH1, CDK4, CDKN1B, CDKN2A, CHEK2, CTNNA1, DICER1, FANCC, FH, FLCN, GALNT12, KIF1B, LZTR1,   Hepatic steatosis 09/08/2021   History of herpes simplex infection    History of partial nephrectomy 03/21/2022   Formatting of this note might be different from the original. 03/15/2021:  Underwent right partial nephrectomy by Dr. Laverle Patter for right renal neoplasm. Pathology: Angiomyolipoma. Formatting of this note might be different from the original. 03/15/2021: Underwent right partial nephrectomy by Dr. Laverle Patter for right renal neoplasm. Pathology: Angiomyolipoma.   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   Hyperlipidemia    Hypertension    no meds since gastric   Hypertension, benign essential, goal below 140/90 06/20/2019   Influenza A 12/14/2020   Iron deficiency anemia 09/14/2021   LUQ abdominal pain 12/24/2020   Malaise 12/22/2020   Mixed dyslipidemia 09/30/2020  Mixed hyperlipidemia 09/30/2020   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   Morbid obesity (HCC) 06/02/2020   Neoplasm of right kidney    benign tumor of kidney   Obesity (BMI 30.0-34.9) 06/08/2022   Obstructive sleep apnea 10/29/2019   Racing heart beat 08/22/2022   Seasonal allergies    Ureteral obstruction, right 07/15/2021   Vaginal delivery 2009, 2010, 2015   Viral disease 03/02/2022   Vitamin D deficiency    Vitamin D insufficiency 09/30/2020   Weight loss 09/30/2020   Objective:  PHYSICAL EXAM:   BP (!) 138/98   Pulse 91   Temp (!) 97.2 F (36.2 C)   Ht 5\' 3"  (1.6 m)   Wt 170 lb (77.1 kg)   LMP 10/13/2022   SpO2 100%   BMI 30.11 kg/m   Orthostatic Vitals for the past 48 hrs (Last 6 readings):  Patient Position Orthostatic BP Orthostatic Pulse BP Pulse  12/07/22 0849 -- -- -- (!) 138/98 91  12/07/22 0850 Supine 130/78 93 -- --  12/07/22 0851 Sitting 132/82 92 -- --  12/07/22 0852 Standing 130/85 85 -- --  Not symptomatic with orthostatics   GEN: Well nourished, well developed, in no acute distress  HEENT: normal external ears and nose - normal external auditory canals and TMS - hearing grossly normal -  - Lips, Teeth and Gums - normal  Oropharynx - normal mucosa, palate, and posterior pharynx  Cardiac: RRR; no murmurs, rubs, or gallops,no edema -  Respiratory:   normal respiratory rate and pattern with no distress - normal breath sounds with no rales, rhonchi, wheezes or rubs GI: normal bowel sounds, no masses or tenderness  Neuro:  Alert and Oriented x 3,- CN II-Xii grossly intact Psych: euthymic mood, appropriate affect and demeanor  EKG - normal Assessment & Plan:    Other fatigue -     CBC with Differential/Platelet -     Comprehensive metabolic panel -     TSH -     Iron, TIBC and Ferritin Panel  Dizziness -     CBC with Differential/Platelet -     Comprehensive metabolic panel -     TSH -     Iron, TIBC and Ferritin Panel -     EKG 12-Lead -     Meclizine HCl; Take 1 tablet (25 mg total) by mouth 3 (three) times daily as needed for dizziness.  Dispense: 30 tablet; Refill: 0     Follow-up: Return if symptoms worsen or fail to improve.  An After Visit Summary was printed and given to the patient.  Jettie Pagan [facility] [phone]

## 2022-12-08 LAB — COMPREHENSIVE METABOLIC PANEL
ALT: 23 [IU]/L (ref 0–32)
AST: 19 [IU]/L (ref 0–40)
Albumin: 4.3 g/dL (ref 3.9–4.9)
Alkaline Phosphatase: 86 [IU]/L (ref 44–121)
BUN/Creatinine Ratio: 20 (ref 9–23)
BUN: 13 mg/dL (ref 6–20)
Bilirubin Total: 0.7 mg/dL (ref 0.0–1.2)
CO2: 23 mmol/L (ref 20–29)
Calcium: 8.9 mg/dL (ref 8.7–10.2)
Chloride: 103 mmol/L (ref 96–106)
Creatinine, Ser: 0.64 mg/dL (ref 0.57–1.00)
Globulin, Total: 2 g/dL (ref 1.5–4.5)
Glucose: 75 mg/dL (ref 70–99)
Potassium: 4.1 mmol/L (ref 3.5–5.2)
Sodium: 141 mmol/L (ref 134–144)
Total Protein: 6.3 g/dL (ref 6.0–8.5)
eGFR: 119 mL/min/{1.73_m2} (ref >59)

## 2022-12-08 LAB — CBC WITH DIFFERENTIAL/PLATELET
Basophils Absolute: 0.1 10*3/uL (ref 0.0–0.2)
Basos: 1 %
EOS (ABSOLUTE): 0.4 10*3/uL (ref 0.0–0.4)
Eos: 4 %
Hematocrit: 35.9 % (ref 34.0–46.6)
Hemoglobin: 11.7 g/dL (ref 11.1–15.9)
Immature Grans (Abs): 0 10*3/uL (ref 0.0–0.1)
Immature Granulocytes: 0 %
Lymphocytes Absolute: 3.2 10*3/uL — ABNORMAL HIGH (ref 0.7–3.1)
Lymphs: 34 %
MCH: 28.5 pg (ref 26.6–33.0)
MCHC: 32.6 g/dL (ref 31.5–35.7)
MCV: 87 fL (ref 79–97)
Monocytes Absolute: 0.6 10*3/uL (ref 0.1–0.9)
Monocytes: 6 %
Neutrophils Absolute: 5.2 10*3/uL (ref 1.4–7.0)
Neutrophils: 55 %
Platelets: 314 10*3/uL (ref 150–450)
RBC: 4.11 x10E6/uL (ref 3.77–5.28)
RDW: 13 % (ref 11.7–15.4)
WBC: 9.4 10*3/uL (ref 3.4–10.8)

## 2022-12-08 LAB — IRON,TIBC AND FERRITIN PANEL
Ferritin: 16 ng/mL (ref 15–150)
Iron Saturation: 18 % (ref 15–55)
Iron: 63 ug/dL (ref 27–159)
Total Iron Binding Capacity: 349 ug/dL (ref 250–450)
UIBC: 286 ug/dL (ref 131–425)

## 2022-12-08 LAB — TSH: TSH: 1.08 u[IU]/mL (ref 0.450–4.500)

## 2022-12-12 ENCOUNTER — Other Ambulatory Visit: Payer: Self-pay | Admitting: Physician Assistant

## 2022-12-12 ENCOUNTER — Encounter: Payer: Self-pay | Admitting: *Deleted

## 2022-12-12 MED ORDER — LISDEXAMFETAMINE DIMESYLATE 50 MG PO CAPS: 50.0000 mg | ORAL_CAPSULE | Freq: Every day | ORAL | 0 refills | Status: DC

## 2022-12-14 ENCOUNTER — Encounter: Payer: Self-pay | Admitting: Obstetrics and Gynecology

## 2022-12-14 ENCOUNTER — Other Ambulatory Visit: Payer: Self-pay

## 2022-12-14 ENCOUNTER — Ambulatory Visit (INDEPENDENT_AMBULATORY_CARE_PROVIDER_SITE_OTHER): Payer: 59 | Admitting: Obstetrics and Gynecology

## 2022-12-14 VITALS — BP 120/76 | HR 75 | Ht 62.75 in | Wt 179.0 lb

## 2022-12-14 DIAGNOSIS — N8003 Adenomyosis of the uterus: Secondary | ICD-10-CM | POA: Diagnosis not present

## 2022-12-14 DIAGNOSIS — D500 Iron deficiency anemia secondary to blood loss (chronic): Secondary | ICD-10-CM

## 2022-12-14 DIAGNOSIS — N921 Excessive and frequent menstruation with irregular cycle: Secondary | ICD-10-CM

## 2022-12-14 DIAGNOSIS — Z01818 Encounter for other preprocedural examination: Secondary | ICD-10-CM | POA: Diagnosis not present

## 2022-12-14 DIAGNOSIS — N946 Dysmenorrhea, unspecified: Secondary | ICD-10-CM

## 2022-12-14 MED ORDER — NORETHINDRONE ACETATE 5 MG PO TABS
5.0000 mg | ORAL_TABLET | Freq: Every day | ORAL | 0 refills | Status: DC
  Filled 2022-12-14: qty 30, 30d supply, fill #0

## 2022-12-14 NOTE — H&P (View-Only) (Signed)
Michelle Lamb 1991-02-09 [id number]  PREOP H&P for RLH, bilateral salpingectomy, cystoscopy  Patient desires to stay overnight.  Has had syncope after her other surgeries  History:  32 y.o. G7P4  with 9 kids at her house.  Patient with known known anemia and adenomyosis here today for surgical consult for [facility].  Patient with c/o BTB, very heavy periods (changing super pads every 2 hours), severe dysmenorrhea on Junel Fe 24. Hx iron infusion (last infusion 8 months ago). No missed or late pills. CBC reassuring last month at PCP. She is on continuous ocp's this month and is still having the irregular bleeding.  She is frustrated with the bleeding and would like a definitive management.  She does not desires anymore children.    Gynecologic History Patient's last menstrual period was 12/11/2022 (exact date). Period Duration (Days):  (varies) Period Pattern: (!) Irregular Menstrual Flow: Heavy Menstrual Control: Maxi pad Dysmenorrhea: (!) Severe Dysmenorrhea Symptoms: Cramping, Headache  Started her period Saturday and is using 3 pads at a time and soaking through these. Woke up Sunday and the sheets were covered in blood.  Reports seeing golf size clots as well. She is ready to proceed with the surgery.  Obstetric History OB History  Gravida Para Term Preterm AB Living  6 4 3 1 2 4   SAB IAB Ectopic Multiple Live Births  2       4    # Outcome Date GA Lbr Len/2nd Weight Sex Type Anes PTL Lv  6 Term 02/12/14 [redacted]w[redacted]d  6 lb 12 oz (3.062 kg) F Vag-Spont EPI N LIV  5 SAB 2015          4 Term 08/06/08 [redacted]w[redacted]d  6 lb 5 oz (2.863 kg) F Vag-Spont EPI N LIV  3 Term 04/02/07 [redacted]w[redacted]d  5 lb 5 oz (2.41 kg) F Vag-Spont EPI Y LIV     Complications: Preeclampsia, Diabetes mellitus complicating pregnancy  2 SAB           1 Preterm             Obstetric Comments  H/o GDM and HTN in her last pregnancy     Review of Systems Pertinent items noted in HPI and remainder of comprehensive ROS  otherwise negative.   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  Report date is 02/23/2022.      The CancerNext-Expanded gene panel offered by [facility] and includes sequencing, rearrangement, and RNA analysis for the following 77 genes: AIP, ALK,  APC, ATM, AXIN2, BAP1, BARD1, BLM, BMPR1A, BRCA1, BRCA2, BRIP1, CDC73, CDH1, CDK4, CDKN1B, CDKN2A, CHEK2, CTNNA1, DICER1, FANCC, FH, FLCN, GALNT12, KIF1B, LZTR1,   Hepatic steatosis 09/08/2021   History of herpes simplex infection    History of partial nephrectomy 03/21/2022   Formatting of this note might be different from the original. 03/15/2021: Underwent right partial nephrectomy by Dr. Laverle Patter for right renal neoplasm. Pathology: Angiomyolipoma. Formatting of this note might be different from the original. 03/15/2021: Underwent right partial nephrectomy by Dr. Laverle Patter for right renal neoplasm. Pathology: Angiomyolipoma.   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   Morbid obesity (HCC) 06/02/2020   Neoplasm of right kidney    benign tumor of kidney   Obesity (BMI 30.0-34.9) 06/08/2022   Obstructive sleep apnea 10/29/2019   Racing heart beat 08/22/2022   Seasonal allergies    Ureteral obstruction, right 07/15/2021   Vaginal delivery 2009, 2010, 2015   Viral disease 03/02/2022   Vitamin D deficiency    Vitamin D insufficiency 09/30/2020   Weight loss 09/30/2020   Past Surgical History:  Procedure Laterality Date   CESAREAN SECTION  09/29/2016   CHOLECYSTECTOMY     COLONOSCOPY WITH ESOPHAGOGASTRODUODENOSCOPY (EGD)  07/22/2022   CYSTOSCOPY W/ URETERAL STENT PLACEMENT Right 07/15/2021   Procedure: CYSTOSCOPY WITH RETROGRADE PYELOGRAM/URETERAL STENT PLACEMENT;  Surgeon: Heloise Purpura, MD;  Location: [facility];  Service: Urology;  Laterality: Right;   CYSTOSCOPY WITH RETROGRADE PYELOGRAM, URETEROSCOPY AND STENT PLACEMENT Right 05/03/2021   Procedure: CYSTOSCOPY WITH RIGHT RETROGRADE PYELOGRAM, URETEROSCOPY;  Surgeon: Heloise Purpura, MD;  Location: [facility];  Service: Urology;  Laterality: Right;   DILATION AND CURETTAGE OF UTERUS N/A 12/31/2015   Procedure: SUCTION DILATATION AND CURETTAGE;  Surgeon: Silverthorne Bing, MD;  Location: [facility];  Service: Gynecology;  Laterality: N/A;   DILATION AND EVACUATION N/A 01/01/2016   Procedure: DILATATION AND EVACUATION;  Surgeon: Tilda Burrow, MD;  Location: [facility];  Service: Gynecology;  Laterality: N/A;   ESOPHAGOGASTRODUODENOSCOPY     GASTRIC BYPASS  06/02/2020   IR NEPHRO TUBE REMOV/FL  07/16/2021   IR NEPHROSTOMY EXCHANGE RIGHT  05/12/2021   IR NEPHROSTOMY  PLACEMENT RIGHT  05/05/2021   ROBOT ASSISTED PYELOPLASTY Right 07/15/2021   Procedure: XI ROBOTIC ASSISTED  LAPAROSCOPIC PYELOPLASTY;  Surgeon: Heloise Purpura, MD;  [facility]: [facility];  Service: Urology;  Laterality: Right;   ROBOTIC ASSITED PARTIAL NEPHRECTOMY Right 03/15/2021   Procedure: XI ROBOTIC ASSITED PARTIAL NEPHRECTOMY;  Surgeon: Heloise Purpura, MD;  Location: [facility];  Service: Urology;  Laterality: Right;   TONSILLECTOMY     TUBAL LIGATION     UPPER GASTROINTESTINAL ENDOSCOPY     Social History   Socioeconomic History   Marital status: Married    Spouse name: Not on file   Number of children: 9   Years of education: 13   Highest education level: Associate degree: occupational, Scientist, product/process development, or vocational program  Occupational History   Occupation: Scientist, forensic  Tobacco Use   Smoking status: Never   Smokeless tobacco: Never  Vaping Use   Vaping status: Never Used  Substance and Sexual Activity   Alcohol use:  Not Currently   Drug use: Never   Sexual activity: Yes    Partners: Male    Birth control/protection: Surgical    Comment: tubal  Other Topics Concern   Not on file  Social History Narrative   Not on file   Social Determinants of Health   Financial Resource Strain: Low Risk  (08/21/2022)   Overall Financial Resource Strain (CARDIA)    Difficulty of Paying Living Expenses: Not hard at all  Food Insecurity: No Food Insecurity (08/21/2022)   Hunger Vital Sign    Worried About Running Out of Food in the Last Year: Never true    Ran Out of Food in the Last Year: Never true  Transportation Needs: No Transportation Needs (08/21/2022)   PRAPARE - Administrator, Civil Service (Medical): No    Lack of Transportation (Non-Medical): No  Physical Activity: Insufficiently Active (08/21/2022)   Exercise Vital Sign    Days of Exercise per Week: 3 days    Minutes of Exercise per Session: 40 min  Stress: Stress Concern Present (08/21/2022)   [employer] - Occupational Stress Questionnaire    Feeling of Stress : To some extent  Social Connections: Socially Integrated (08/21/2022)   Social Connection and Isolation Panel [NHANES]    Frequency of Communication with Friends and Family: More than three times a week    Frequency of Social Gatherings with Friends and Family: More than three times a week    Attends Religious Services: 1 to 4 times per year    Active Member of [employer] or Organizations: Yes    Attends Engineer, structural: More than 4 times per year    Marital Status: Married   No Known Allergies    Exam: S1S2 CTA B/L Soft, NT  Vitals:   12/14/22 1159  BP: 120/76  Pulse: 75  SpO2: 99%  Weight: 179 lb (81.2 kg)  Height: 5' 2.75" (1.594 m)     Assessment/Plan:  adenomyosis, menorrhagia, anemia, failed hormonal management desires definitive management with the [facility]  Symptomatic fibroid uterus:  Counseled on all options.  She would like to have the [facility].  Counseled extensively on the procedure including but not limited to what to expect and risks and benefits.  Counseled on postop care and pelvic rest for 10 weeks after the surgery with restricted lifting for 6 weeks after.  Counseled on the benefits of the robotic procedure with faster return to daily activities, improved outcomes, and less risk for complications.  Counseled on the importance of ovarian preservaiton. She would like to have this scheduled. The risk, benefit,s alternatives of the procedure were discussed with patient including but not limited to the risk for bleeding, infection, injury to surrounding structures such as the bowel, vessels, bladder, and ureters were reviewed.  The risk for blood products and risk for an additional procedure, and risk for laparotomy in an extreme event was discussed.  The risk for blood clots are also discussed.  Postoperative care instructions were discussed and reviewed with patient.   Postoperative medications were sent to her pharmacy and patient was instructed to pick up prior to surgery.  She will have a driver to take her there and take her home and she agrees that that person will stay with her overnight. Post care instructions were also put in the wrap-up section of this note for patient, as a reminder of care instructions. All questions were answered and patient agrees and would like to  proceed with the procedure. Discussed she can add on aygestin until surgery with ocp's to help reduce her bleeding.  To stop her hormones once she has the hysterectomy.

## 2022-12-14 NOTE — Progress Notes (Signed)
 Michelle Lamb October 29, 1990 [id number]  PREOP H&P for RLH, bilateral salpingectomy, cystoscopy  Patient desires to stay overnight.  Has had syncope after her other surgeries  History:  32 y.o. G7P4  with 9 kids at her house.  Patient with known known anemia and adenomyosis here today for surgical consult for [facility]  Patient with c/o BTB, very heavy periods (changing super pads every 2 hours), severe dysmenorrhea on Junel  Fe 24. Hx iron  infusion (last infusion 8 months ago). No missed or late pills. CBC reassuring last month at PCP. She is on continuous ocp's this month and is still having the irregular bleeding.  She is frustrated with the bleeding and would like a definitive management.  She does not desires anymore children.    Gynecologic History Patient's last menstrual period was 12/11/2022 (exact date). Period Duration (Days):  (varies) Period Pattern: (!) Irregular Menstrual Flow: Heavy Menstrual Control: Maxi pad Dysmenorrhea: (!) Severe Dysmenorrhea Symptoms: Cramping, Headache  Started her period Saturday and is using 3 pads at a time and soaking through these. Woke up Sunday and the sheets were covered in blood.  Reports seeing golf size clots as well. She is ready to proceed with the surgery.  Obstetric History OB History  Gravida Para Term Preterm AB Living  6 4 3 1 2 4   SAB IAB Ectopic Multiple Live Births  2       4    # Outcome Date GA Lbr Len/2nd Weight Sex Type Anes PTL Lv  6 Term 02/12/14 [redacted]w[redacted]d  6 lb 12 oz (3.062 kg) F Vag-Spont EPI N LIV  5 SAB 2015          4 Term 08/06/08 [redacted]w[redacted]d  6 lb 5 oz (2.863 kg) F Vag-Spont EPI N LIV  3 Term 04/02/07 [redacted]w[redacted]d  5 lb 5 oz (2.41 kg) F Vag-Spont EPI Y LIV     Complications: Preeclampsia, Diabetes mellitus complicating pregnancy  2 SAB           1 Preterm             Obstetric Comments  H/o GDM and HTN in her last pregnancy     Review of Systems Pertinent items noted in HPI and remainder of comprehensive ROS  otherwise negative.   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  Report date is 02/23/2022.      The CancerNext-Expanded gene panel offered by [facility] and includes sequencing, rearrangement, and RNA analysis for the following 77 genes: AIP, ALK,  APC, ATM, AXIN2, BAP1, BARD1, BLM, BMPR1A, BRCA1, BRCA2, BRIP1, CDC73, CDH1, CDK4, CDKN1B, CDKN2A, CHEK2, CTNNA1, DICER1, FANCC, FH, FLCN, GALNT12, KIF1B, LZTR1,   Hepatic steatosis 09/08/2021   History of herpes simplex infection    History of partial nephrectomy 03/21/2022   Formatting of this note might be different from the original. 03/15/2021: Underwent right partial nephrectomy by Dr. Renda for right renal neoplasm. Pathology: Angiomyolipoma. Formatting of this note might be different from the original. 03/15/2021: Underwent right partial nephrectomy by Dr. Renda for right renal neoplasm. Pathology: Angiomyolipoma.   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   Morbid obesity (HCC) 06/02/2020   Neoplasm of right kidney    benign tumor of kidney   Obesity (BMI 30.0-34.9) 06/08/2022   Obstructive sleep apnea 10/29/2019   Racing heart beat 08/22/2022   Seasonal allergies    Ureteral obstruction, right 07/15/2021   Vaginal delivery 2009, 2010, 2015   Viral disease 03/02/2022   Vitamin D  deficiency    Vitamin D  insufficiency 09/30/2020   Weight loss 09/30/2020   Past Surgical History:  Procedure Laterality Date   CESAREAN SECTION  09/29/2016   CHOLECYSTECTOMY     COLONOSCOPY WITH ESOPHAGOGASTRODUODENOSCOPY (EGD)  07/22/2022   CYSTOSCOPY W/ URETERAL STENT PLACEMENT Right 07/15/2021   Procedure: CYSTOSCOPY WITH RETROGRADE PYELOGRAM/URETERAL STENT PLACEMENT;  Surgeon: Renda Glance, MD;  Location: [facility];  Service: Urology;  Laterality: Right;   CYSTOSCOPY WITH RETROGRADE PYELOGRAM, URETEROSCOPY AND STENT PLACEMENT Right 05/03/2021   Procedure: CYSTOSCOPY WITH RIGHT RETROGRADE PYELOGRAM, URETEROSCOPY;  Surgeon: Renda Glance, MD;  [facility]: [facility];  Service: Urology;  Laterality: Right;   DILATION AND CURETTAGE OF UTERUS N/A 12/31/2015   Procedure: SUCTION DILATATION AND CURETTAGE;  Surgeon: Bebe Furry, MD;  Location: [facility];  Service: Gynecology;  Laterality: N/A;   DILATION AND EVACUATION N/A 01/01/2016   Procedure: DILATATION AND EVACUATION;  Surgeon: Norleen LULLA Server, MD;  Location: [facility];  Service: Gynecology;  Laterality: N/A;   ESOPHAGOGASTRODUODENOSCOPY     GASTRIC BYPASS  06/02/2020   IR NEPHRO TUBE REMOV/FL  07/16/2021   IR NEPHROSTOMY EXCHANGE RIGHT  05/12/2021   IR NEPHROSTOMY  PLACEMENT RIGHT  05/05/2021   ROBOT ASSISTED PYELOPLASTY Right 07/15/2021   Procedure: XI ROBOTIC ASSISTED  LAPAROSCOPIC PYELOPLASTY;  Surgeon: Renda Glance, MD;  [facility]: [facility];  Service: Urology;  Laterality: Right;   ROBOTIC ASSITED PARTIAL NEPHRECTOMY Right 03/15/2021   Procedure: XI ROBOTIC ASSITED PARTIAL NEPHRECTOMY;  Surgeon: Renda Glance, MD;  [facility]: [facility];  Service: Urology;  Laterality: Right;   TONSILLECTOMY     TUBAL LIGATION     UPPER GASTROINTESTINAL ENDOSCOPY     Social History   Socioeconomic History   Marital status: Married    Spouse name: Not on file   Number of children: 9   Years of education: 13   Highest education level: Associate degree: occupational, Scientist, product/process development, or vocational program  Occupational History   Occupation: Scientist, forensic  Tobacco Use   Smoking status: Never   Smokeless tobacco: Never  Vaping Use   Vaping status: Never Used  Substance and Sexual Activity   Alcohol use:  Not Currently   Drug use: Never   Sexual activity: Yes    Partners: Male    Birth control/protection: Surgical    Comment: tubal  Other Topics Concern   Not on file  Social History Narrative   Not on file   Social Determinants of Health   Financial Resource Strain: Low Risk  (08/21/2022)   Overall Financial Resource Strain (CARDIA)    Difficulty of Paying Living Expenses: Not hard at all  Food Insecurity: No Food Insecurity (08/21/2022)   Hunger Vital Sign    Worried About Running Out of Food in the Last Year: Never true    Ran Out of Food in the Last Year: Never true  Transportation Needs: No Transportation Needs (08/21/2022)   PRAPARE - Administrator, Civil Service (Medical): No    Lack of Transportation (Non-Medical): No  Physical Activity: Insufficiently Active (08/21/2022)   Exercise Vital Sign    Days of Exercise per Week: 3 days    Minutes of Exercise per Session: 40 min  Stress: Stress Concern Present (08/21/2022)   [employer] - Occupational Stress Questionnaire    Feeling of Stress : To some extent  Social Connections: Socially Integrated (08/21/2022)   Social Connection and Isolation Panel [NHANES]    Frequency of Communication with Friends and Family: More than three times a week    Frequency of Social Gatherings with Friends and Family: More than three times a week    Attends Religious Services: 1 to 4 times per year    Active Member of [employer] or Organizations: Yes    Attends Engineer, structural: More than 4 times per year    Marital Status: Married   No Known Allergies    Exam: S1S2 CTA B/L Soft, NT  Vitals:   12/14/22 1159  BP: 120/76  Pulse: 75  SpO2: 99%  Weight: 179 lb (81.2 kg)  Height: 5' 2.75" (1.594 m)     Assessment/Plan:  adenomyosis, menorrhagia, anemia, failed hormonal management desires definitive management with the [facility]  Symptomatic fibroid uterus:  Counseled on all options.  She would like to have the [facility]  Counseled extensively on the procedure including but not limited to what to expect and risks and benefits.  Counseled on postop care and pelvic rest for 10 weeks after the surgery with restricted lifting for 6 weeks after.  Counseled on the benefits of the robotic procedure with faster return to daily activities, improved outcomes, and less risk for complications.  Counseled on the importance of ovarian preservaiton. She would like to have this scheduled. The risk, benefit,s alternatives of the procedure were discussed with patient including but not limited to the risk for bleeding, infection, injury to surrounding structures such as the bowel, vessels, bladder, and ureters were reviewed.  The risk for blood products and risk for an additional procedure, and risk for laparotomy in an extreme event was discussed.  The risk for blood clots are also discussed.  Postoperative care instructions were discussed and reviewed with patient.   Postoperative medications were sent to her pharmacy and patient was instructed to pick up prior to surgery.  She will have a driver to take her there and take her home and she agrees that that person will stay with her overnight. Post care instructions were also put in the wrap-up section of this note for patient, as a reminder of care instructions. All questions were answered and patient agrees and would like to  proceed with the procedure. Discussed she can add on aygestin  until surgery with ocp's to help reduce her bleeding.  To stop her hormones once she has the hysterectomy.

## 2022-12-15 ENCOUNTER — Encounter: Payer: Self-pay | Admitting: Hematology and Oncology

## 2022-12-15 ENCOUNTER — Other Ambulatory Visit: Payer: Self-pay

## 2022-12-15 ENCOUNTER — Other Ambulatory Visit (HOSPITAL_COMMUNITY): Payer: Self-pay

## 2022-12-15 MED ORDER — SEMAGLUTIDE-WEIGHT MANAGEMENT 0.25 MG/0.5ML ~~LOC~~ SOAJ
0.2500 mg | SUBCUTANEOUS | 0 refills | Status: DC
Start: 2022-12-14 — End: 2023-01-18
  Filled 2022-12-15 – 2023-01-14 (×2): qty 2, 28d supply, fill #0

## 2022-12-15 MED ORDER — LISDEXAMFETAMINE DIMESYLATE 50 MG PO CAPS
50.0000 mg | ORAL_CAPSULE | Freq: Every day | ORAL | 0 refills | Status: DC
  Filled 2022-12-15: qty 30, 30d supply, fill #0

## 2022-12-19 ENCOUNTER — Other Ambulatory Visit: Payer: Self-pay

## 2022-12-19 ENCOUNTER — Encounter (HOSPITAL_BASED_OUTPATIENT_CLINIC_OR_DEPARTMENT_OTHER): Payer: Self-pay | Admitting: Obstetrics and Gynecology

## 2022-12-19 ENCOUNTER — Other Ambulatory Visit (HOSPITAL_COMMUNITY): Payer: Self-pay

## 2022-12-19 ENCOUNTER — Other Ambulatory Visit: Payer: Self-pay | Admitting: Physician Assistant

## 2022-12-19 DIAGNOSIS — F418 Other specified anxiety disorders: Secondary | ICD-10-CM

## 2022-12-19 DIAGNOSIS — Z905 Acquired absence of kidney: Secondary | ICD-10-CM | POA: Diagnosis not present

## 2022-12-19 NOTE — Progress Notes (Addendum)
Spoke w/ via phone for pre-op interview---Michelle Lamb needs dos----UPT         Lamb results------12/23/22 Lamb appt for cbc, type & screen, 12/07/22 cbc, cmp in Epic, 12/08/22 EKG in Epic & chart, 06/15/22 Myocardial Perfusion - normal in Epic, 09/15/22 Long term heart monitor in Epic COVID test -----patient states asymptomatic no test needed Arrive at -------0530 on Tuesday, 12/27/22 NPO after MN NO Solid Food.  Clear liquids from MN until---0430 Med rec completed Medications to take morning of surgery -----Albuterol inhaler prn, Flonase prn, Lorazepam prn, Antivert prn, Lo Estrin, Zofran prn, Effexor, Protonix Diabetic medication -----Patient aware that GLP-1 Agonist must be held for 7 days. Patient stated that she had already stopped taking it. Patient instructed no nail polish to be worn day of surgery Patient instructed to bring photo id and insurance card day of surgery Patient aware to have Driver (ride ) / caregiver    for 24 hours after surgery - husband, Michelle Lamb Patient Special Instructions -----Extended / overnight stay instructions given.  Please bring inhaler with you on day of surgery. Pre-Op special Instructions -----none Patient verbalized understanding of instructions that were given at this phone interview. Patient denies chest pain, sob, fever, cough at the interview.   Patient has a hx of HTN, HLP, DM2, dizziness and palpitationsm. HTN, HLP and DM2 have improved since gastric bypass surgery in 2022. Cardiology LOV with Wallis Bamberg, NP on 10/13/22 in Epic.

## 2022-12-19 NOTE — Progress Notes (Signed)
Your procedure is scheduled on Tuesday, 12/27/2022.  Report to [facility] [place] AT  5:30 AM.   Call this number if you have problems the morning of surgery  :[phone].   OUR ADDRESS IS [address].  WE ARE LOCATED IN [facility].  PLEASE BRING YOUR INSURANCE CARD AND PHOTO ID DAY OF SURGERY.  ONLY 2 PEOPLE ARE ALLOWED IN  WAITING  ROOM                                      REMEMBER:  DO NOT EAT FOOD, CANDY GUM OR MINTS  AFTER MIDNIGHT THE NIGHT BEFORE YOUR SURGERY . YOU MAY HAVE CLEAR LIQUIDS FROM MIDNIGHT THE NIGHT BEFORE YOUR SURGERY UNTIL  4:30 AM. NO CLEAR LIQUIDS AFTER   4:30 AM DAY OF SURGERY.  YOU MAY  BRUSH YOUR TEETH MORNING OF SURGERY AND RINSE YOUR MOUTH OUT, NO CHEWING GUM CANDY OR MINTS.     CLEAR LIQUID DIET    Allowed      Water                                                                   Coffee and tea, regular and decaf  (NO cream or milk products of any type, may sweeten)                         Carbonated beverages, regular and diet                                    Sports drinks like Gatorade _____________________________________________________________________     TAKE ONLY THESE MEDICATIONS MORNING OF SURGERY: Effexor, Protonix, Lo Estrin If needed you may take: Albuterol inhaler, Flonase, Lorazepam, Antivert, Zofran Please bring your albuterol inhaler with you on the day of surgery.                                        DO NOT WEAR JEWERLY/  METAL/  PIERCINGS (INCLUDING NO PLASTIC PIERCINGS) DO NOT WEAR LOTIONS, POWDERS, PERFUMES OR NAIL POLISH ON YOUR FINGERNAILS. TOENAIL POLISH IS OK TO WEAR. DO NOT SHAVE FOR 48 HOURS PRIOR TO DAY OF SURGERY.  CONTACTS, GLASSES, OR DENTURES MAY NOT BE WORN TO SURGERY.  REMEMBER: NO SMOKING, VAPING ,  DRUGS OR ALCOHOL FOR 24 HOURS BEFORE YOUR SURGERY.                                    [facility] IS NOT RESPONSIBLE  FOR ANY BELONGINGS.                                                                     Marland Kitchen  [place] - Preparing for Surgery Before surgery, you can play an important role.  Because skin is not sterile, your skin needs to be as free of germs as possible.  You can reduce the number of germs on your skin by washing with CHG (chlorahexidine gluconate) soap before surgery.  CHG is an antiseptic cleaner which kills germs and bonds with the skin to continue killing germs even after washing. Please DO NOT use if you have an allergy to CHG or antibacterial soaps.  If your skin becomes reddened/irritated stop using the CHG and inform your nurse when you arrive at Short Stay. Do not shave (including legs and underarms) for at least 48 hours prior to the first CHG shower.  You may shave your face/neck. Please follow these instructions carefully:  1.  Shower with CHG Soap the night before surgery and the  morning of Surgery.  2.  If you choose to wash your hair, wash your hair first as usual with your  normal  shampoo.  3.  After you shampoo, rinse your hair and body thoroughly to remove the  shampoo.                                        4.  Use CHG as you would any other liquid soap.  You can apply chg directly  to the skin and wash , chg soap provided, night before and morning of your surgery.  5.  Apply the CHG Soap to your body ONLY FROM THE NECK DOWN.   Do not use on face/ open                           Wound or open sores. Avoid contact with eyes, ears mouth and genitals (private parts).                       Wash face,  Genitals (private parts) with your normal soap.             6.  Wash thoroughly, paying special attention to the area where your surgery  will be performed.  7.  Thoroughly rinse your body with warm water from the neck down.  8.  DO NOT shower/wash with your normal soap after using and rinsing off  the CHG Soap.             9.  Pat yourself dry with a clean towel.            10.  Wear clean pajamas.            11.   Place clean sheets on your bed the night of your first shower and do not  sleep with pets. Day of Surgery : Do not apply any lotions/ powders the morning of surgery.  Please wear clean clothes to the hospital/surgery center.  IF YOU HAVE ANY SKIN IRRITATION OR PROBLEMS WITH THE SURGICAL SOAP, PLEASE GET A BAR OF GOLD DIAL SOAP AND SHOWER THE NIGHT BEFORE YOUR SURGERY AND THE MORNING OF YOUR SURGERY. PLEASE LET THE NURSE KNOW MORNING OF YOUR SURGERY IF YOU HAD ANY PROBLEMS WITH THE SURGICAL SOAP.   YOUR SURGEON MAY HAVE REQUESTED EXTENDED RECOVERY TIME AFTER YOUR SURGERY. IT COULD BE A  JUST A FEW HOURS  UP TO AN OVERNIGHT STAY.  YOUR SURGEON SHOULD HAVE DISCUSSED  THIS WITH YOU PRIOR TO YOUR SURGERY. IN THE EVENT YOU NEED TO STAY OVERNIGHT PLEASE REFER TO THE FOLLOWING GUIDELINES. YOU MAY HAVE UP TO 4 VISITORS  MAY VISIT IN THE EXTENDED RECOVERY ROOM UNTIL 800 PM ONLY.  ONE  VISITOR AGE 70 AND OVER MAY SPEND THE NIGHT AND MUST BE IN EXTENDED RECOVERY ROOM NO LATER THAN 800 PM . YOUR DISCHARGE TIME AFTER YOU SPEND THE NIGHT IS 900 AM THE MORNING AFTER YOUR SURGERY. YOU MAY PACK A SMALL OVERNIGHT BAG WITH TOILETRIES FOR YOUR OVERNIGHT STAY IF YOU WISH.  REGARDLESS OF IF YOU STAY OVER NIGHT OR ARE DISCHARGED THE SAME DAY YOU WILL BE REQUIRED TO HAVE A RESPONSIBLE ADULT (18 YRS OLD OR OLDER) STAY WITH YOU FOR AT LEAST THE FIRST 24 HOURS  YOUR PRESCRIPTION MEDICATIONS WILL BE PROVIDED DURING YOUR HOSPITAL STAY.  ________________________________________________________________________                                                        QUESTIONS Mechele Claude PRE OP NURSE PHONE [phone].

## 2022-12-21 ENCOUNTER — Other Ambulatory Visit: Payer: Self-pay

## 2022-12-21 ENCOUNTER — Other Ambulatory Visit (HOSPITAL_COMMUNITY): Payer: Self-pay

## 2022-12-21 ENCOUNTER — Other Ambulatory Visit: Payer: Self-pay | Admitting: Physician Assistant

## 2022-12-21 DIAGNOSIS — F418 Other specified anxiety disorders: Secondary | ICD-10-CM

## 2022-12-21 MED ORDER — VENLAFAXINE HCL ER 75 MG PO CP24
75.0000 mg | ORAL_CAPSULE | Freq: Every day | ORAL | 2 refills | Status: DC
  Filled 2022-12-21: qty 30, 30d supply, fill #0

## 2022-12-23 ENCOUNTER — Encounter (HOSPITAL_COMMUNITY)
Admission: RE | Admit: 2022-12-23 | Discharge: 2022-12-23 | Disposition: A | Discharge status: Home / Self Care | Payer: 59 | Source: Ambulatory Visit | Attending: Obstetrics and Gynecology | Admitting: Obstetrics and Gynecology

## 2022-12-23 DIAGNOSIS — Z01818 Encounter for other preprocedural examination: Secondary | ICD-10-CM | POA: Insufficient documentation

## 2022-12-23 LAB — TYPE AND SCREEN
ABO/RH(D): O POS
Antibody Screen: NEGATIVE

## 2022-12-23 LAB — CBC
HCT: 35 % — ABNORMAL LOW (ref 36.0–46.0)
Hemoglobin: 11.1 g/dL — ABNORMAL LOW (ref 12.0–15.0)
MCH: 28.3 pg (ref 26.0–34.0)
MCHC: 31.7 g/dL (ref 30.0–36.0)
MCV: 89.3 fL (ref 80.0–100.0)
Platelets: 312 10*3/uL (ref 150–400)
RBC: 3.92 MIL/uL (ref 3.87–5.11)
RDW: 13 % (ref 11.5–15.5)
WBC: 10.8 10*3/uL — ABNORMAL HIGH (ref 4.0–10.5)
nRBC: 0 % (ref 0.0–0.2)

## 2022-12-27 ENCOUNTER — Ambulatory Visit (HOSPITAL_BASED_OUTPATIENT_CLINIC_OR_DEPARTMENT_OTHER)
Admission: RE | Admit: 2022-12-27 | Discharge: 2022-12-28 | Disposition: A | Discharge status: Home / Self Care | Payer: 59 | Attending: Obstetrics and Gynecology | Admitting: Obstetrics and Gynecology

## 2022-12-27 ENCOUNTER — Other Ambulatory Visit: Payer: Self-pay

## 2022-12-27 ENCOUNTER — Ambulatory Visit (HOSPITAL_BASED_OUTPATIENT_CLINIC_OR_DEPARTMENT_OTHER): Payer: 59 | Admitting: Anesthesiology

## 2022-12-27 ENCOUNTER — Encounter (HOSPITAL_BASED_OUTPATIENT_CLINIC_OR_DEPARTMENT_OTHER): Payer: Self-pay | Admitting: Obstetrics and Gynecology

## 2022-12-27 ENCOUNTER — Encounter (HOSPITAL_BASED_OUTPATIENT_CLINIC_OR_DEPARTMENT_OTHER)
Admission: RE | Disposition: A | Discharge status: Home / Self Care | Payer: Self-pay | Source: Home / Self Care | Attending: Obstetrics and Gynecology

## 2022-12-27 DIAGNOSIS — G4730 Sleep apnea, unspecified: Secondary | ICD-10-CM | POA: Diagnosis not present

## 2022-12-27 DIAGNOSIS — Z905 Acquired absence of kidney: Secondary | ICD-10-CM | POA: Diagnosis not present

## 2022-12-27 DIAGNOSIS — N920 Excessive and frequent menstruation with regular cycle: Secondary | ICD-10-CM | POA: Diagnosis not present

## 2022-12-27 DIAGNOSIS — N888 Other specified noninflammatory disorders of cervix uteri: Secondary | ICD-10-CM | POA: Insufficient documentation

## 2022-12-27 DIAGNOSIS — N946 Dysmenorrhea, unspecified: Secondary | ICD-10-CM | POA: Insufficient documentation

## 2022-12-27 DIAGNOSIS — N921 Excessive and frequent menstruation with irregular cycle: Secondary | ICD-10-CM | POA: Insufficient documentation

## 2022-12-27 DIAGNOSIS — D500 Iron deficiency anemia secondary to blood loss (chronic): Secondary | ICD-10-CM | POA: Diagnosis not present

## 2022-12-27 DIAGNOSIS — Z09 Encounter for follow-up examination after completed treatment for conditions other than malignant neoplasm: Secondary | ICD-10-CM

## 2022-12-27 DIAGNOSIS — N8003 Adenomyosis of the uterus: Secondary | ICD-10-CM

## 2022-12-27 DIAGNOSIS — N189 Chronic kidney disease, unspecified: Secondary | ICD-10-CM | POA: Insufficient documentation

## 2022-12-27 DIAGNOSIS — K660 Peritoneal adhesions (postprocedural) (postinfection): Secondary | ICD-10-CM | POA: Insufficient documentation

## 2022-12-27 DIAGNOSIS — Z9884 Bariatric surgery status: Secondary | ICD-10-CM | POA: Diagnosis not present

## 2022-12-27 DIAGNOSIS — G4733 Obstructive sleep apnea (adult) (pediatric): Secondary | ICD-10-CM | POA: Diagnosis not present

## 2022-12-27 DIAGNOSIS — E1122 Type 2 diabetes mellitus with diabetic chronic kidney disease: Secondary | ICD-10-CM | POA: Diagnosis not present

## 2022-12-27 DIAGNOSIS — Z6831 Body mass index (BMI) 31.0-31.9, adult: Secondary | ICD-10-CM | POA: Diagnosis not present

## 2022-12-27 DIAGNOSIS — K219 Gastro-esophageal reflux disease without esophagitis: Secondary | ICD-10-CM | POA: Diagnosis not present

## 2022-12-27 DIAGNOSIS — J45909 Unspecified asthma, uncomplicated: Secondary | ICD-10-CM | POA: Insufficient documentation

## 2022-12-27 DIAGNOSIS — Z01818 Encounter for other preprocedural examination: Secondary | ICD-10-CM

## 2022-12-27 DIAGNOSIS — I129 Hypertensive chronic kidney disease with stage 1 through stage 4 chronic kidney disease, or unspecified chronic kidney disease: Secondary | ICD-10-CM | POA: Insufficient documentation

## 2022-12-27 HISTORY — PX: CYSTOSCOPY: SHX5120

## 2022-12-27 HISTORY — PX: ROBOTIC ASSISTED LAPAROSCOPIC HYSTERECTOMY AND SALPINGECTOMY: SHX6379

## 2022-12-27 HISTORY — DX: Cardiac murmur, unspecified: R01.1

## 2022-12-27 LAB — POCT I-STAT, CHEM 8
BUN: 16 mg/dL (ref 6–20)
Calcium, Ion: 1.18 mmol/L (ref 1.15–1.40)
Chloride: 101 mmol/L (ref 98–111)
Creatinine, Ser: 0.6 mg/dL (ref 0.44–1.00)
Glucose, Bld: 75 mg/dL (ref 70–99)
HCT: 35 % — ABNORMAL LOW (ref 36.0–46.0)
Hemoglobin: 11.9 g/dL — ABNORMAL LOW (ref 12.0–15.0)
Potassium: 3.9 mmol/L (ref 3.5–5.1)
Sodium: 139 mmol/L (ref 135–145)
TCO2: 26 mmol/L (ref 22–32)

## 2022-12-27 LAB — GLUCOSE, CAPILLARY: Glucose-Capillary: 91 mg/dL (ref 70–99)

## 2022-12-27 LAB — POCT PREGNANCY, URINE: Preg Test, Ur: NEGATIVE

## 2022-12-27 SURGERY — XI ROBOTIC ASSISTED LAPAROSCOPIC HYSTERECTOMY AND SALPINGECTOMY
Anesthesia: General | Site: Urethra

## 2022-12-27 MED ORDER — KETOROLAC TROMETHAMINE 30 MG/ML IJ SOLN: 30.0000 mg | Freq: Once | INTRAMUSCULAR | Status: AC | PRN

## 2022-12-27 MED ORDER — OXYCODONE HCL 5 MG/5ML PO SOLN: 5.0000 mg | Freq: Once | ORAL | Status: AC | PRN

## 2022-12-27 MED ORDER — OXYCODONE HCL 5 MG PO TABS
ORAL_TABLET | ORAL | Status: AC
  Filled 2022-12-27: qty 1

## 2022-12-27 MED ORDER — OXYCODONE HCL 5 MG PO TABS
5.0000 mg | ORAL_TABLET | ORAL | Status: DC | PRN
  Administered 2022-12-27 (×3): 5 mg via ORAL
  Administered 2022-12-28 (×2): 10 mg via ORAL

## 2022-12-27 MED ORDER — FENTANYL CITRATE (PF) 100 MCG/2ML IJ SOLN
INTRAMUSCULAR | Status: AC
  Filled 2022-12-27: qty 2

## 2022-12-27 MED ORDER — ONDANSETRON HCL 4 MG/2ML IJ SOLN
INTRAMUSCULAR | Status: DC | PRN
  Administered 2022-12-27: 4 mg via INTRAVENOUS

## 2022-12-27 MED ORDER — PANTOPRAZOLE SODIUM 40 MG IV SOLR
INTRAVENOUS | Status: AC
  Filled 2022-12-27: qty 10

## 2022-12-27 MED ORDER — ONDANSETRON HCL 4 MG/2ML IJ SOLN: 4.0000 mg | Freq: Four times a day (QID) | INTRAMUSCULAR | Status: DC | PRN

## 2022-12-27 MED ORDER — PROPOFOL 10 MG/ML IV BOLUS
INTRAVENOUS | Status: AC
  Filled 2022-12-27: qty 20

## 2022-12-27 MED ORDER — DOCUSATE SODIUM 100 MG PO CAPS
ORAL_CAPSULE | ORAL | Status: AC
  Filled 2022-12-27: qty 1

## 2022-12-27 MED ORDER — ACETAMINOPHEN 325 MG PO TABS
650.0000 mg | ORAL_TABLET | ORAL | Status: DC | PRN
  Administered 2022-12-28: 650 mg via ORAL

## 2022-12-27 MED ORDER — LIDOCAINE 2% (20 MG/ML) 5 ML SYRINGE
INTRAMUSCULAR | Status: DC | PRN
  Administered 2022-12-27: 100 mg via INTRAVENOUS

## 2022-12-27 MED ORDER — DEXAMETHASONE SODIUM PHOSPHATE 10 MG/ML IJ SOLN
INTRAMUSCULAR | Status: DC | PRN
  Administered 2022-12-27: 5 mg via INTRAVENOUS

## 2022-12-27 MED ORDER — HYDROMORPHONE HCL 1 MG/ML IJ SOLN: 0.2500 mg | INTRAMUSCULAR | Status: DC | PRN

## 2022-12-27 MED ORDER — ROCURONIUM BROMIDE 10 MG/ML (PF) SYRINGE
PREFILLED_SYRINGE | INTRAVENOUS | Status: AC
  Filled 2022-12-27: qty 10

## 2022-12-27 MED ORDER — DOCUSATE SODIUM 100 MG PO CAPS
100.0000 mg | ORAL_CAPSULE | Freq: Two times a day (BID) | ORAL | Status: DC
  Administered 2022-12-27: 100 mg via ORAL

## 2022-12-27 MED ORDER — SODIUM CHLORIDE 0.9 % IV SOLN: Freq: Once | INTRAVENOUS | Status: DC

## 2022-12-27 MED ORDER — HYDROMORPHONE HCL 1 MG/ML IJ SOLN
0.2000 mg | INTRAMUSCULAR | Status: DC | PRN
  Administered 2022-12-27 – 2022-12-28 (×2): 0.5 mg via INTRAVENOUS

## 2022-12-27 MED ORDER — CEFTRIAXONE SODIUM 1 G IJ SOLR
INTRAMUSCULAR | Status: AC
  Filled 2022-12-27: qty 10

## 2022-12-27 MED ORDER — SODIUM CHLORIDE 0.9 % IV SOLN: INTRAVENOUS | Status: AC

## 2022-12-27 MED ORDER — GLYCOPYRROLATE PF 0.2 MG/ML IJ SOSY
PREFILLED_SYRINGE | INTRAMUSCULAR | Status: AC
Start: 2022-12-27 — End: ?
  Filled 2022-12-27: qty 1

## 2022-12-27 MED ORDER — SODIUM CHLORIDE 0.9 % IV SOLN
2.0000 g | INTRAVENOUS | Status: AC
  Administered 2022-12-27: 2 g via INTRAVENOUS

## 2022-12-27 MED ORDER — CEFOXITIN SODIUM 2 G IV SOLR
INTRAVENOUS | Status: AC
  Filled 2022-12-27: qty 2

## 2022-12-27 MED ORDER — GABAPENTIN 300 MG PO CAPS
300.0000 mg | ORAL_CAPSULE | ORAL | Status: AC
  Administered 2022-12-27: 300 mg via ORAL

## 2022-12-27 MED ORDER — GLYCOPYRROLATE 0.2 MG/ML IJ SOLN
INTRAMUSCULAR | Status: DC | PRN
  Administered 2022-12-27: .2 mg via INTRAVENOUS

## 2022-12-27 MED ORDER — OXYCODONE HCL 5 MG PO TABS
5.0000 mg | ORAL_TABLET | Freq: Once | ORAL | Status: AC | PRN
  Administered 2022-12-27: 5 mg via ORAL

## 2022-12-27 MED ORDER — SODIUM CHLORIDE 0.9 % IV SOLN
Freq: Once | INTRAVENOUS | Status: AC
  Administered 2022-12-27: 700 mL
  Filled 2022-12-27: qty 10

## 2022-12-27 MED ORDER — FENTANYL CITRATE (PF) 100 MCG/2ML IJ SOLN
INTRAMUSCULAR | Status: DC | PRN
  Administered 2022-12-27 (×3): 50 ug via INTRAVENOUS

## 2022-12-27 MED ORDER — DROPERIDOL 2.5 MG/ML IJ SOLN
INTRAMUSCULAR | Status: DC | PRN
  Administered 2022-12-27: .625 mg via INTRAVENOUS

## 2022-12-27 MED ORDER — STERILE WATER FOR IRRIGATION IR SOLN
Status: DC | PRN
  Administered 2022-12-27: 500 mL

## 2022-12-27 MED ORDER — PANTOPRAZOLE SODIUM 40 MG IV SOLR
40.0000 mg | Freq: Every day | INTRAVENOUS | Status: DC
  Administered 2022-12-27: 40 mg via INTRAVENOUS

## 2022-12-27 MED ORDER — ACETAMINOPHEN 500 MG PO TABS
1000.0000 mg | ORAL_TABLET | ORAL | Status: AC
  Administered 2022-12-27: 1000 mg via ORAL

## 2022-12-27 MED ORDER — ONDANSETRON HCL 4 MG/2ML IJ SOLN: 4.0000 mg | Freq: Once | INTRAMUSCULAR | Status: DC | PRN

## 2022-12-27 MED ORDER — DEXAMETHASONE SODIUM PHOSPHATE 10 MG/ML IJ SOLN
INTRAMUSCULAR | Status: AC
  Filled 2022-12-27: qty 1

## 2022-12-27 MED ORDER — SUGAMMADEX SODIUM 200 MG/2ML IV SOLN
INTRAVENOUS | Status: DC | PRN
  Administered 2022-12-27: 200 mg via INTRAVENOUS

## 2022-12-27 MED ORDER — SODIUM CHLORIDE 0.9 % IV SOLN
INTRAVENOUS | Status: AC
  Filled 2022-12-27: qty 100

## 2022-12-27 MED ORDER — DEXMEDETOMIDINE HCL IN NACL 80 MCG/20ML IV SOLN
INTRAVENOUS | Status: DC | PRN
  Administered 2022-12-27: 12 ug via INTRAVENOUS

## 2022-12-27 MED ORDER — HYDROMORPHONE HCL 1 MG/ML IJ SOLN
INTRAMUSCULAR | Status: AC
  Filled 2022-12-27: qty 1

## 2022-12-27 MED ORDER — GABAPENTIN 100 MG PO CAPS: 100.0000 mg | ORAL_CAPSULE | Freq: Once | ORAL | Status: AC

## 2022-12-27 MED ORDER — ROCURONIUM BROMIDE 10 MG/ML (PF) SYRINGE
PREFILLED_SYRINGE | INTRAVENOUS | Status: DC | PRN
  Administered 2022-12-27: 60 mg via INTRAVENOUS

## 2022-12-27 MED ORDER — BUPIVACAINE HCL (PF) 0.5 % IJ SOLN
INTRAMUSCULAR | Status: DC | PRN
  Administered 2022-12-27: 15 mL

## 2022-12-27 MED ORDER — ONDANSETRON HCL 4 MG PO TABS: 4.0000 mg | ORAL_TABLET | Freq: Four times a day (QID) | ORAL | Status: DC | PRN

## 2022-12-27 MED ORDER — POVIDONE-IODINE 10 % EX SWAB: 2.0000 | Freq: Once | CUTANEOUS | Status: AC

## 2022-12-27 MED ORDER — DEXMEDETOMIDINE HCL IN NACL 80 MCG/20ML IV SOLN
INTRAVENOUS | Status: AC
  Filled 2022-12-27: qty 20

## 2022-12-27 MED ORDER — PROPOFOL 10 MG/ML IV BOLUS
INTRAVENOUS | Status: DC | PRN
  Administered 2022-12-27: 150 mg via INTRAVENOUS

## 2022-12-27 MED ORDER — MIDAZOLAM HCL 5 MG/5ML IJ SOLN
INTRAMUSCULAR | Status: DC | PRN
  Administered 2022-12-27: 2 mg via INTRAVENOUS

## 2022-12-27 MED ORDER — ACETAMINOPHEN 500 MG PO TABS
ORAL_TABLET | ORAL | Status: AC
  Filled 2022-12-27: qty 2

## 2022-12-27 MED ORDER — GABAPENTIN 300 MG PO CAPS
ORAL_CAPSULE | ORAL | Status: AC
Start: 2022-12-27 — End: ?
  Filled 2022-12-27: qty 1

## 2022-12-27 MED ORDER — SOD CITRATE-CITRIC ACID 500-334 MG/5ML PO SOLN: 30.0000 mL | ORAL | Status: AC

## 2022-12-27 MED ORDER — ONDANSETRON HCL 4 MG/2ML IJ SOLN
INTRAMUSCULAR | Status: AC
  Filled 2022-12-27: qty 2

## 2022-12-27 MED ORDER — MIDAZOLAM HCL 2 MG/2ML IJ SOLN
INTRAMUSCULAR | Status: AC
  Filled 2022-12-27: qty 2

## 2022-12-27 MED ORDER — LACTATED RINGERS IV SOLN: INTRAVENOUS | Status: AC

## 2022-12-27 SURGICAL SUPPLY — 49 items
ADH SKN CLS APL DERMABOND .7 (GAUZE/BANDAGES/DRESSINGS) ×2
CATH FOLEY 3WAY 5CC 16FR (CATHETERS) ×2 IMPLANT
COVER BACK TABLE 60X90IN (DRAPES) ×2 IMPLANT
COVER TIP SHEARS 8 DVNC (MISCELLANEOUS) ×2 IMPLANT
DEFOGGER SCOPE WARMER CLEARIFY (MISCELLANEOUS) ×2 IMPLANT
DERMABOND ADVANCED .7 DNX12 (GAUZE/BANDAGES/DRESSINGS) ×2 IMPLANT
DRAPE ARM DVNC X/XI (DISPOSABLE) ×8 IMPLANT
DRAPE COLUMN DVNC XI (DISPOSABLE) ×2 IMPLANT
DRAPE UTILITY XL STRL (DRAPES) ×2 IMPLANT
DRIVER NDL MEGA SUTCUT DVNCXI (INSTRUMENTS) IMPLANT
DRIVER NDLE MEGA SUTCUT DVNCXI (INSTRUMENTS) ×2
DURAPREP 26ML APPLICATOR (WOUND CARE) ×2 IMPLANT
ELECT REM PT RETURN 9FT ADLT (ELECTROSURGICAL) ×2
ELECTRODE REM PT RTRN 9FT ADLT (ELECTROSURGICAL) ×2 IMPLANT
FORCEPS PROGRASP DVNC XI (FORCEP) IMPLANT
GAUZE 4X4 16PLY ~~LOC~~+RFID DBL (SPONGE) IMPLANT
GLOVE BIO SURGEON STRL SZ7 (GLOVE) IMPLANT
GLOVE BIOGEL PI IND STRL 7.0 (GLOVE) IMPLANT
GLOVE BIOGEL PI IND STRL 7.5 (GLOVE) IMPLANT
GLOVE NEODERM STER SZ 7 (GLOVE) ×6 IMPLANT
GLOVE SURG SS PI 6.5 STRL IVOR (GLOVE) IMPLANT
GLOVE SURG SS PI 7.0 STRL IVOR (GLOVE) IMPLANT
HIBICLENS CHG 4% 4OZ (MISCELLANEOUS) IMPLANT
IRRIG SUCT STRYKERFLOW 2 WTIP (MISCELLANEOUS) ×2
IRRIGATION SUCT STRKRFLW 2 WTP (MISCELLANEOUS) ×2 IMPLANT
KIT PINK PAD W/HEAD ARE REST (MISCELLANEOUS) ×2
KIT PINK PAD W/HEAD ARM REST (MISCELLANEOUS) ×2 IMPLANT
KIT TURNOVER CYSTO (KITS) ×2 IMPLANT
LEGGING LITHOTOMY PAIR STRL (DRAPES) ×2 IMPLANT
MANIFOLD NEPTUNE II (INSTRUMENTS) ×2 IMPLANT
OBTURATOR OPTICAL STND 8 DVNC (TROCAR) ×2
OBTURATOR OPTICALSTD 8 DVNC (TROCAR) ×2 IMPLANT
OCCLUDER COLPOPNEUMO (BALLOONS) IMPLANT
PACK ROBOT WH (CUSTOM PROCEDURE TRAY) ×2 IMPLANT
PACK ROBOTIC GOWN (GOWN DISPOSABLE) ×2 IMPLANT
PAD OB MATERNITY 4.3X12.25 (PERSONAL CARE ITEMS) ×2 IMPLANT
SCISSORS MNPLR CVD DVNC XI (INSTRUMENTS) IMPLANT
SEAL UNIV 5-12 XI (MISCELLANEOUS) ×6 IMPLANT
SEALER VESSEL EXT DVNC XI (MISCELLANEOUS) IMPLANT
SET IRRIG Y TYPE TUR BLADDER L (SET/KITS/TRAYS/PACK) ×2 IMPLANT
SET TUBE SMOKE EVAC HIGH FLOW (TUBING) ×2 IMPLANT
SOL PREP POV-IOD 4OZ 10% (MISCELLANEOUS) IMPLANT
SPIKE FLUID TRANSFER (MISCELLANEOUS) ×2 IMPLANT
SUT MNCRL AB 4-0 PS2 18 (SUTURE) ×2 IMPLANT
SUT VLOC 180 0 9IN GS21 (SUTURE) ×2 IMPLANT
TIP UTERINE 6.7X8CM BLUE DISP (MISCELLANEOUS) IMPLANT
TOWEL OR 17X24 6PK STRL BLUE (TOWEL DISPOSABLE) ×2 IMPLANT
UNDERPAD 30X36 HEAVY ABSORB (UNDERPADS AND DIAPERS) ×2 IMPLANT
WATER STERILE IRR 500ML POUR (IV SOLUTION) ×2 IMPLANT

## 2022-12-27 NOTE — Op Note (Signed)
12/27/2022  [id number] Michelle Lamb        OPERATIVE REPORT   Preop Diagnosis: menorrhagia, dysmenorrhea, anemia, adenomyosis, CKD, right nephrectomy  Procedure: robotic hysterectomy, bilateral salpingectomy, cystoscopy   Surgeon: Dr. Allayne Butcher Assistant: Donne Hazel, RN   Fluids: please see anesthesia report   Complications: None Anesthesia: General     Findings:  boggy contour 8cm uterus, normal ovaries and tubes Cystoscopy at the end of the case with normal bladder and patent left ureter.  evidence of prior tubal ligation  Estimated blood loss: Minimal <15cc   Specimens: Uterus, cervix and bilateral tubes   Disposition of specimen: Pathology           Patient is taken to the operating room. She is placed in the supine position. She is a running IV in place. Informed consent was present on the chart. SCDs on her lower extremities and functioning properly. Patient was positioned while she was awake.  Her legs were placed in the low lithotomy position in [place] stirrups. Her arms were tucked by the side.  General endotracheal anesthesia was administered by the anesthesia staff without difficulty.       Clora prep was then used to prep the abdomen and Hibiclens was used to prep the inner thighs, perineum and vagina. Once 3 minutes had past the patient was draped in a normal standard fashion. A proper time out was performed and everyone agreed.  The legs were lifted to the high lithotomy position. A bivalve speculum was inserted into the vagina and the anterior lip of the cervix was grasped with single-tooth tenaculum.  The uterus sounded to 9 cm. Pratt dilators were used to dilate the cervix.  The RUMI uterine manipulator was obtained inserted into the endometrial cavity and the bulb of the disposable tip was inflated with 8 cc of normal saline. There was a good fit of the KOH ring around the cervix. The tenaculum and bivavle speculum was removed.  There is also good manipulation of the uterus.  A Foley catheter was placed to straight drain.  Clear urine was noted. Legs were lowered to the low lithotomy position and attention was turned the abdomen.   Superior to the umbilicus, marcaine 0.25% used to anesthetize the skin.  Using #11 blade, 8mm skin incision was made.  The 8mm robotic trocar and sleeve was inserted under direct visualization.  CO2 gas was  started and patient was placed in trendelenburg position.  Two additional 8mm ports were placed under direct visualization in the left and right lower quadrant.     Ureters were identifies.  Attention was turned to the left side. The left tube was elevated and the mesosalpinx was desiccated with the vessel sealer.  The left uterine ovarian pedicle was serially clamped cauterized and incised. Left round ligament was serially clamped cauterized and incised. The anterior and posterior peritoneum of the inferior leaf of the broad ligament were opened. The beginning of the bladder flap was created.  The bladder was taken down below the level of the KOH ring. The left uterine artery skeletonized and then just superior to the KOH ring this vessel was serially clamped, cauterized, and incised.   Attention was turned the right side.  The uterus was placed on stretch to the opposite side.    The mesosalpinx was incised freeing the tube. Then the right uterine ovarian pedicle was serially clamped cauterized and incised. Next the right round ligament was serially clamped cauterized and incised. The anterior posterior  peritoneum of the inferiorly for the broad ligament were opened. The anterior peritoneum was carried across to the dissection on the left side. The remainder of the bladder flap was created using sharp dissection. The bladder was well below the level of the KOH ring. The right uterine artery skeletonized. Then the right uterine artery, above the level of the KOH ring, was serially clamped cauterized  and incised. The uterus was devascularized at this point.   The colpotomy was performed.  This was carried around a circumferential fashion until the vaginal mucosa was completely incised in the specimen was freed.  The specimen was then delivered to the vagina.  A vaginal occlusive device was used to maintain the pneumoperitoneum   Instruments were changed with a needle driver and prograsp.  Using a 9 inch  zero V-lock suture, the cuff was closed by incorporating the anterior and posterior vaginal mucosa in each stitch. This was carried across all the way to the left corner and a running fashion. Two stitches were brought back towards the midline and the suture was cut flush with the vagina. The needle was brought out the pelvis. The pelvis was irrigated. All pedicles were inspected. No bleeding was noted.   Co2 pressures were lowered to 8mm Hg.  Again, no bleeding was noted.   At this point the procedure was completed.  The remaining instruments were removed.  The ports were removed under direct visualization of the laparoscope and the pneumoperitoneum was relieved.   The skin was then closed with subcuticular stitches of 3-0 Vicryl. The skin was cleansed Dermabond was applied. Attention was then turned the vagina and the cuff was inspected. No bleeding was noted.  The Foley catheter was removed.  Cystoscopy was performed.  No sutures or bladder injuries were noted.  Left ureter was  noted with normal urine jets.  Foley was left out after the cystoscopic fluid was drained and cystoscope removed.  Sponge, lap, needle, instrument counts were correct x2. Patient tolerated the procedure very well. She was awakened from anesthesia, extubated and taken to recovery in stable condition.      Dr. Karma Greaser

## 2022-12-27 NOTE — Interval H&P Note (Signed)
History and Physical Interval Note:  12/27/2022 7:16 AM  Michelle Lamb  has presented today for surgery, with the diagnosis of Adenomyosis, Menorrhagia with irregular cycle  Iron deficiency anemia due to chronic blood loss, Dysmenorrhea.  The various methods of treatment have been discussed with the patient and family. After consideration of risks, benefits and other options for treatment, the patient has consented to  Procedure(s): XI ROBOTIC ASSISTED LAPAROSCOPIC HYSTERECTOMY AND SALPINGECTOMY (Bilateral) CYSTOSCOPY (N/A) as a surgical intervention.  The patient's history has been reviewed, patient examined, no change in status, stable for surgery.  I have reviewed the patient's chart and labs.  Questions were answered to the patient's satisfaction.     Earley Favor

## 2022-12-27 NOTE — Plan of Care (Signed)
 [facility] Tonsillectomy/Adenoidectomy, Postoperative PEDS care plan entered in error.

## 2022-12-27 NOTE — Transfer of Care (Signed)
Immediate Anesthesia Transfer of Care Note  Patient: Michelle Lamb  Procedure(s) Performed: XI ROBOTIC ASSISTED LAPAROSCOPIC HYSTERECTOMY AND SALPINGECTOMY (Bilateral: Abdomen) CYSTOSCOPY (Urethra)  Patient Location: PACU  Anesthesia Type:General  Level of Consciousness: drowsy and patient cooperative  Airway & Oxygen Therapy: Patient Spontanous Breathing and Patient connected to nasal cannula oxygen  Post-op Assessment: Report given to RN and Post -op Vital signs reviewed and stable  Post vital signs: Reviewed and stable  Last Vitals:  Vitals Value Taken Time  BP 102/55 12/27/22 0910  Temp    Pulse 63 12/27/22 0913  Resp 13 12/27/22 0913  SpO2 100 % 12/27/22 0913  Vitals shown include unfiled device data.  Last Pain:  Vitals:   12/27/22 0601  TempSrc: Oral  PainSc: 2       Patients Stated Pain Goal: 4 (12/27/22 0601)  Complications: No notable events documented.

## 2022-12-27 NOTE — Anesthesia Postprocedure Evaluation (Signed)
Anesthesia Post Note  Patient: [name]  Procedure(s) Performed: XI ROBOTIC ASSISTED LAPAROSCOPIC HYSTERECTOMY AND SALPINGECTOMY (Bilateral: Abdomen) CYSTOSCOPY (Urethra)     Patient location during evaluation: PACU Anesthesia Type: General Level of consciousness: awake and alert Pain management: pain level controlled Vital Signs Assessment: post-procedure vital signs reviewed and stable Respiratory status: spontaneous breathing, nonlabored ventilation, respiratory function stable and patient connected to nasal cannula oxygen Cardiovascular status: blood pressure returned to baseline and stable Postop Assessment: no apparent nausea or vomiting Anesthetic complications: no  No notable events documented.  Last Vitals:  Vitals:   12/27/22 0945 12/27/22 1000  BP: 109/60 112/65  Pulse: 60 61  Resp: 12 12  Temp:    SpO2: 100% 100%    Last Pain:  Vitals:   12/27/22 0930  TempSrc:   PainSc: Asleep                 Sadiel Mota S

## 2022-12-27 NOTE — Anesthesia Procedure Notes (Signed)
Procedure Name: Intubation Date/Time: 12/27/2022 7:37 AM  Performed by: Bishop Limbo, CRNAPre-anesthesia Checklist: Patient identified, Emergency Drugs available, Suction available and Patient being monitored Patient Re-evaluated:Patient Re-evaluated prior to induction Oxygen Delivery Method: Circle System Utilized Preoxygenation: Pre-oxygenation with 100% oxygen Induction Type: IV induction Ventilation: Mask ventilation without difficulty Laryngoscope Size: Mac and 3 Grade View: Grade I Tube type: Oral Tube size: 7.0 mm Number of attempts: 1 Airway Equipment and Method: Stylet Placement Confirmation: ETT inserted through vocal cords under direct vision, positive ETCO2 and breath sounds checked- equal and bilateral Secured at: 22 cm Tube secured with: Tape Dental Injury: Teeth and Oropharynx as per pre-operative assessment

## 2022-12-27 NOTE — Anesthesia Preprocedure Evaluation (Signed)
Anesthesia Evaluation  Patient identified by MRN, date of birth, ID band Patient awake    Reviewed: Allergy & Precautions, H&P , NPO status , Patient's Chart, lab work & pertinent test results  Airway Mallampati: II  TM Distance: >3 FB Neck ROM: Full    Dental no notable dental hx.    Pulmonary asthma , sleep apnea    Pulmonary exam normal breath sounds clear to auscultation       Cardiovascular hypertension, Normal cardiovascular exam Rhythm:Regular Rate:Normal     Neuro/Psych negative neurological ROS  negative psych ROS   GI/Hepatic Neg liver ROS,GERD  Medicated,,  Endo/Other  diabetes    Renal/GU Renal InsufficiencyRenal disease  negative genitourinary   Musculoskeletal negative musculoskeletal ROS (+)    Abdominal   Peds negative pediatric ROS (+)  Hematology negative hematology ROS (+)   Anesthesia Other Findings   Reproductive/Obstetrics negative OB ROS                             Anesthesia Physical Anesthesia Plan  ASA: 2  Anesthesia Plan: General   Post-op Pain Management: Tylenol PO (pre-op)* and Gabapentin PO (pre-op)*   Induction: Intravenous  PONV Risk Score and Plan: 3 and Ondansetron, Dexamethasone and Treatment may vary due to age or medical condition  Airway Management Planned: Oral ETT  Additional Equipment:   Intra-op Plan:   Post-operative Plan: Extubation in OR  Informed Consent: I have reviewed the patients History and Physical, chart, labs and discussed the procedure including the risks, benefits and alternatives for the proposed anesthesia with the patient or authorized representative who has indicated his/her understanding and acceptance.     Dental advisory given  Plan Discussed with: CRNA and Surgeon  Anesthesia Plan Comments:        Anesthesia Quick Evaluation

## 2022-12-28 ENCOUNTER — Encounter: Payer: Self-pay | Admitting: Obstetrics and Gynecology

## 2022-12-28 ENCOUNTER — Other Ambulatory Visit (HOSPITAL_COMMUNITY): Payer: Self-pay

## 2022-12-28 ENCOUNTER — Encounter: Payer: Self-pay | Admitting: Hematology and Oncology

## 2022-12-28 DIAGNOSIS — N946 Dysmenorrhea, unspecified: Secondary | ICD-10-CM | POA: Diagnosis not present

## 2022-12-28 DIAGNOSIS — N921 Excessive and frequent menstruation with irregular cycle: Secondary | ICD-10-CM | POA: Diagnosis not present

## 2022-12-28 DIAGNOSIS — K660 Peritoneal adhesions (postprocedural) (postinfection): Secondary | ICD-10-CM | POA: Diagnosis not present

## 2022-12-28 DIAGNOSIS — E1122 Type 2 diabetes mellitus with diabetic chronic kidney disease: Secondary | ICD-10-CM | POA: Diagnosis not present

## 2022-12-28 DIAGNOSIS — D500 Iron deficiency anemia secondary to blood loss (chronic): Secondary | ICD-10-CM | POA: Diagnosis not present

## 2022-12-28 DIAGNOSIS — N189 Chronic kidney disease, unspecified: Secondary | ICD-10-CM | POA: Diagnosis not present

## 2022-12-28 DIAGNOSIS — I129 Hypertensive chronic kidney disease with stage 1 through stage 4 chronic kidney disease, or unspecified chronic kidney disease: Secondary | ICD-10-CM | POA: Diagnosis not present

## 2022-12-28 DIAGNOSIS — N8003 Adenomyosis of the uterus: Secondary | ICD-10-CM | POA: Diagnosis not present

## 2022-12-28 DIAGNOSIS — Z905 Acquired absence of kidney: Secondary | ICD-10-CM | POA: Diagnosis not present

## 2022-12-28 DIAGNOSIS — N888 Other specified noninflammatory disorders of cervix uteri: Secondary | ICD-10-CM | POA: Diagnosis not present

## 2022-12-28 LAB — SURGICAL PATHOLOGY

## 2022-12-28 MED ORDER — OXYCODONE HCL 5 MG PO TABS
ORAL_TABLET | ORAL | Status: AC
  Filled 2022-12-28: qty 2

## 2022-12-28 MED ORDER — HYDROMORPHONE HCL 1 MG/ML IJ SOLN
INTRAMUSCULAR | Status: AC
  Filled 2022-12-28: qty 1

## 2022-12-28 MED ORDER — OXYCODONE HCL 5 MG PO TABS
5.0000 mg | ORAL_TABLET | Freq: Four times a day (QID) | ORAL | 0 refills | Status: DC | PRN
  Filled 2022-12-28 (×2): qty 8, 1d supply, fill #0

## 2022-12-28 MED ORDER — ACETAMINOPHEN 500 MG PO TABS
500.0000 mg | ORAL_TABLET | Freq: Four times a day (QID) | ORAL | 0 refills | Status: DC | PRN
  Filled 2022-12-28: qty 30, 8d supply, fill #0

## 2022-12-28 MED ORDER — ACETAMINOPHEN 325 MG PO TABS
ORAL_TABLET | ORAL | Status: AC
  Filled 2022-12-28: qty 2

## 2022-12-28 NOTE — Telephone Encounter (Signed)
Spoke with patient. Patient reports itching all over her body. Denies SOB or swelling of face or wheezing. Requesting alternative to oxycodone. Patient states she received pain medication prior to leaving surgery center this morning, due again at 1430. Pain currently 6/10. Pharmacy updated. Advised will forward request to Dr. Karma Greaser to review. Patient agreeable.   Dr. Karma Greaser -please review and advise.

## 2022-12-28 NOTE — Progress Notes (Signed)
Patient is doing well. No VB Voiding and ambulating. Good pain control  Blood pressure (!) 103/55, pulse 62, temperature 97.9 F (36.6 C), resp. rate 18, height 5\' 3"  (1.6 m), weight 80.8 kg, last menstrual period 12/04/2022, SpO2 99%.  Abdomen: soft, incisions dry with no erythema, approp tender No resp distress  A/p POD#1 RLH doing well  Discharge to home today Dr. Karma Greaser

## 2022-12-29 ENCOUNTER — Encounter (HOSPITAL_BASED_OUTPATIENT_CLINIC_OR_DEPARTMENT_OTHER): Payer: Self-pay | Admitting: Obstetrics and Gynecology

## 2022-12-29 DIAGNOSIS — Z0289 Encounter for other administrative examinations: Secondary | ICD-10-CM

## 2022-12-30 ENCOUNTER — Encounter (HOSPITAL_BASED_OUTPATIENT_CLINIC_OR_DEPARTMENT_OTHER): Payer: Self-pay | Admitting: Obstetrics & Gynecology

## 2022-12-30 MED ORDER — HYDROMORPHONE HCL 2 MG PO TABS
1.0000 mg | ORAL_TABLET | ORAL | 0 refills | Status: DC | PRN
Start: 2022-12-30 — End: 2022-12-31

## 2022-12-31 ENCOUNTER — Telehealth (HOSPITAL_BASED_OUTPATIENT_CLINIC_OR_DEPARTMENT_OTHER): Payer: Self-pay | Admitting: Obstetrics & Gynecology

## 2022-12-31 DIAGNOSIS — G8918 Other acute postprocedural pain: Secondary | ICD-10-CM

## 2022-12-31 MED ORDER — TRAMADOL HCL 50 MG PO TABS: 50.0000 mg | ORAL_TABLET | Freq: Four times a day (QID) | ORAL | 0 refills | Status: DC | PRN

## 2022-12-31 NOTE — Telephone Encounter (Signed)
Pt called on call provider reporting redness along her lower abdomen on Friday evening 11/1.  Pt feels well.  Had BM on Friday.  Walking.  No trouble voiding.  Minimal vaginal spotting.  Underwent robotic TLH/salpingectomy on 12/27/2022.  Concerned about  possible infection but no fevers.  I asked her to send picture to me and it looked a little more like a reaction.  Pt reports issues with narcotics in the hospital and itching.  Was then prescribed oral dialudid and she took it about 2 hours before calling and noticed symptoms about 1 hour before calling.  Advised to take a benadryl or zyrtec (or similar medication) and to monitor temperature and call with other concerns through the night but I would check on her in AM of Saturday (11/2).    I called pt this morning.  Skin is much better.  She sent me another picture and there is essentially no erythema.  She is feeling she needs something more than Tylenol for pain.  Has taken gabapentin in the past and doesn't like how it makes her feel.  H/o gastric bypass so advised no ibuprofen.  Rx for tramadol to pharmacy. Advised possible risk of reaction as well.  Advised to monitor closely.  Questions answered.  Pt appreciative of phone call.    Routing to Dr. Karma Greaser, just [facility]

## 2023-01-01 DIAGNOSIS — R1013 Epigastric pain: Secondary | ICD-10-CM | POA: Diagnosis not present

## 2023-01-01 DIAGNOSIS — J90 Pleural effusion, not elsewhere classified: Secondary | ICD-10-CM | POA: Diagnosis not present

## 2023-01-01 DIAGNOSIS — R9431 Abnormal electrocardiogram [ECG] [EKG]: Secondary | ICD-10-CM | POA: Diagnosis not present

## 2023-01-01 DIAGNOSIS — R0600 Dyspnea, unspecified: Secondary | ICD-10-CM | POA: Diagnosis not present

## 2023-01-01 DIAGNOSIS — L03311 Cellulitis of abdominal wall: Secondary | ICD-10-CM | POA: Diagnosis not present

## 2023-01-01 DIAGNOSIS — R0789 Other chest pain: Secondary | ICD-10-CM | POA: Diagnosis not present

## 2023-01-01 DIAGNOSIS — R091 Pleurisy: Secondary | ICD-10-CM | POA: Diagnosis not present

## 2023-01-02 ENCOUNTER — Other Ambulatory Visit: Payer: Self-pay

## 2023-01-03 ENCOUNTER — Ambulatory Visit: Payer: 59 | Admitting: Physician Assistant

## 2023-01-10 ENCOUNTER — Ambulatory Visit (INDEPENDENT_AMBULATORY_CARE_PROVIDER_SITE_OTHER): Payer: 59 | Admitting: Obstetrics and Gynecology

## 2023-01-10 ENCOUNTER — Encounter: Payer: Self-pay | Admitting: Obstetrics and Gynecology

## 2023-01-10 VITALS — BP 114/70 | HR 81

## 2023-01-10 DIAGNOSIS — N760 Acute vaginitis: Secondary | ICD-10-CM

## 2023-01-10 DIAGNOSIS — R309 Painful micturition, unspecified: Secondary | ICD-10-CM | POA: Diagnosis not present

## 2023-01-10 MED ORDER — CIPROFLOXACIN HCL 500 MG PO TABS: 500.0000 mg | ORAL_TABLET | Freq: Two times a day (BID) | ORAL | 0 refills | Status: AC

## 2023-01-10 MED ORDER — METRONIDAZOLE 500 MG PO TABS: 500.0000 mg | ORAL_TABLET | Freq: Two times a day (BID) | ORAL | 0 refills | Status: DC

## 2023-01-10 MED ORDER — FLUCONAZOLE 150 MG PO TABS: 150.0000 mg | ORAL_TABLET | Freq: Once | ORAL | 2 refills | Status: AC

## 2023-01-10 NOTE — Progress Notes (Signed)
Patient presents for 2 week postop from [facility], bilateral salpingectomy, cystoscopy. She is doing well. No fevers, VB,  or severe abdominal pain. No CVA tenderness. Had rash from likely chlorhexadine that has resolved Feels suprapubic pressure and increased frequency She has had cipro in the past for bladder infections and this has worked.  Blood pressure 104/70, pulse 72, weight 184 lb (83.5 kg), last menstrual period 11/30/2022, SpO2 100%.  Abdomen: incisions I/c/d, NT, ND  A/p PO from [facility] 2 weeks doing well Encouraged no heavy lifting, pushing, pulling greater than 10 lbs for full 8 weeks 2. Pelvic rest for the entire 10 wks 3. RTC with any concerns or with heavy bleeding, fevers or severe abdominal pain. 4. Cipro for UTI. Diflucan tor yeast prophylaxis. BV on UA.  She has had flagyl before with no complications as well.  To not drink ETOH with this medication. Dr. Karma Greaser

## 2023-01-13 LAB — URINALYSIS, COMPLETE W/RFL CULTURE
Glucose, UA: NEGATIVE
Hyaline Cast: NONE SEEN /[LPF]
Nitrites, Initial: NEGATIVE
Specific Gravity, Urine: 1.028 (ref 1.001–1.035)
pH: 5.5 (ref 5.0–8.0)

## 2023-01-13 LAB — URINE CULTURE
MICRO NUMBER:: 15718666
SPECIMEN QUALITY:: ADEQUATE

## 2023-01-13 LAB — CULTURE INDICATED

## 2023-01-14 ENCOUNTER — Other Ambulatory Visit (HOSPITAL_COMMUNITY): Payer: Self-pay

## 2023-01-16 ENCOUNTER — Telehealth: Payer: Self-pay | Admitting: *Deleted

## 2023-01-16 NOTE — Telephone Encounter (Signed)
Patient left message requesting return call, no details.   Left message to call [facility] Triage at [phone], option 4.

## 2023-01-17 ENCOUNTER — Other Ambulatory Visit: Payer: Self-pay

## 2023-01-17 DIAGNOSIS — F418 Other specified anxiety disorders: Secondary | ICD-10-CM

## 2023-01-17 MED ORDER — LORAZEPAM 1 MG PO TABS: 1.0000 mg | ORAL_TABLET | Freq: Every day | ORAL | 1 refills | Status: DC | PRN

## 2023-01-17 NOTE — Telephone Encounter (Signed)
Spoke with patient. S/p RLH, BS 12/27/22. RTW work working from home on 01/16/23.  She is currently working from home, planning to return to office to work on 01/30/23. Patient is requesting RTW letter with restrictions.   Patient reports pain in lower abdomen that she reported during last OV has not resolved, has not increased. Report spotting dark blood following by red blood yesterday, now spotting on pad. Reports sweating at night. Denies fever/chills. OV recommended for further evaluation, can then further discuss RTW and restrictions, patient agreeable. OV scheduled for 01/18/23 at 1430.   Routing to provider for final review. Patient is agreeable to disposition. Will close encounter.

## 2023-01-17 NOTE — Telephone Encounter (Signed)
Patient left message requesting return call.  

## 2023-01-17 NOTE — Telephone Encounter (Signed)
Patient left message requesting RTW note and to further discuss bleeding .   Left message to call [facility] Triage at [phone], option 4.

## 2023-01-18 ENCOUNTER — Ambulatory Visit (INDEPENDENT_AMBULATORY_CARE_PROVIDER_SITE_OTHER): Payer: 59 | Admitting: Obstetrics and Gynecology

## 2023-01-18 ENCOUNTER — Encounter: Payer: Self-pay | Admitting: Obstetrics and Gynecology

## 2023-01-18 VITALS — BP 114/72 | HR 68 | Temp 98.3°F

## 2023-01-18 DIAGNOSIS — R102 Pelvic and perineal pain: Secondary | ICD-10-CM | POA: Diagnosis not present

## 2023-01-18 DIAGNOSIS — Z09 Encounter for follow-up examination after completed treatment for conditions other than malignant neoplasm: Secondary | ICD-10-CM

## 2023-01-18 MED ORDER — CLINDAMYCIN HCL 300 MG PO CAPS
300.0000 mg | ORAL_CAPSULE | Freq: Three times a day (TID) | ORAL | 0 refills | Status: AC
Start: 2023-01-18 — End: 2023-01-25

## 2023-01-18 MED ORDER — ESTRADIOL 0.1 MG/GM VA CREA: TOPICAL_CREAM | VAGINAL | 0 refills | Status: DC

## 2023-01-18 MED ORDER — FLUCONAZOLE 150 MG PO TABS: 150.0000 mg | ORAL_TABLET | Freq: Once | ORAL | 0 refills | Status: AC

## 2023-01-18 MED ORDER — CEFTRIAXONE SODIUM 1 G IJ SOLR
1.0000 g | Freq: Once | INTRAMUSCULAR | Status: AC
  Administered 2023-01-18: 1 g via INTRAMUSCULAR

## 2023-01-18 NOTE — Progress Notes (Signed)
Patient presents for  postop from [facility], bilateral salpingectomy, cystoscopy. On 1029/24 She is still having cramping at her suprapubic region. No CVA tenderness, No fevers,. Denies having intercourse. Has noticed light bleeding that started off light brown spotting and turned to bright red the last couple of days and stopped today.  BP 114/72   Pulse 68   Temp 98.3 F (36.8 C) (Oral)   LMP 12/04/2022 (Exact Date)   SpO2 98%   SVE: sutures seen no active bleeding, qtip with light brown blood seen  last UC  Aerococcus urinae Abnormal    Comment: 50,000-100,000 CFU/mL of Aerococcus urinae Aerococcus urinae urine isolates usually respond to agents used to treat uncomplicated UTIs, including beta-lactams, nitrofurantoin, or trimethoprim-sulfamethoxazole. Contact the laboratory within 3 days if additional testing is needed (e.g., complicated or recurrent UTI, or treatment failure).   She just finished cipro and flagyl for BV and with persistent BV on UA with residual UTI  PO from [facility] 10/29 Continue pelvic rest Begin estrace PV to aid in vaginal cuff healing No heavy lifting/pushing or pulling Rocephin given today IM. To beign diflucan today and repeat in 48 hours to prevent vaginal candidiasis. RTC in one week or sooner with any concerns  Dr. Karma Greaser  Patient was given 1 gram of rocephin. Patient waited after injection with no side effects.

## 2023-01-20 LAB — URINALYSIS, COMPLETE W/RFL CULTURE
Glucose, UA: NEGATIVE
Hyaline Cast: NONE SEEN /[LPF]
Nitrites, Initial: NEGATIVE
Specific Gravity, Urine: 1.028 (ref 1.001–1.035)
pH: 6 (ref 5.0–8.0)

## 2023-01-20 LAB — URINE CULTURE
MICRO NUMBER:: 15756763
Result:: NO GROWTH
SPECIMEN QUALITY:: ADEQUATE

## 2023-01-20 LAB — CULTURE INDICATED

## 2023-01-30 ENCOUNTER — Other Ambulatory Visit: Payer: Self-pay

## 2023-01-30 DIAGNOSIS — F418 Other specified anxiety disorders: Secondary | ICD-10-CM

## 2023-01-30 MED ORDER — LISDEXAMFETAMINE DIMESYLATE 50 MG PO CAPS
50.0000 mg | ORAL_CAPSULE | Freq: Every day | ORAL | 0 refills | Status: DC
  Filled 2023-01-30 – 2023-01-31 (×2): qty 30, 30d supply, fill #0

## 2023-01-30 MED ORDER — VENLAFAXINE HCL ER 75 MG PO CP24
75.0000 mg | ORAL_CAPSULE | Freq: Every day | ORAL | 2 refills | Status: DC
  Filled 2023-01-30 – 2023-01-31 (×2): qty 30, 30d supply, fill #0

## 2023-01-31 ENCOUNTER — Other Ambulatory Visit (HOSPITAL_BASED_OUTPATIENT_CLINIC_OR_DEPARTMENT_OTHER): Payer: Self-pay

## 2023-01-31 ENCOUNTER — Ambulatory Visit (INDEPENDENT_AMBULATORY_CARE_PROVIDER_SITE_OTHER): Payer: 59 | Admitting: Obstetrics and Gynecology

## 2023-01-31 ENCOUNTER — Encounter: Payer: Self-pay | Admitting: Obstetrics and Gynecology

## 2023-01-31 ENCOUNTER — Other Ambulatory Visit (HOSPITAL_COMMUNITY): Payer: Self-pay

## 2023-01-31 ENCOUNTER — Other Ambulatory Visit: Payer: Self-pay

## 2023-01-31 VITALS — BP 120/78 | HR 64 | Resp 16

## 2023-01-31 DIAGNOSIS — Z09 Encounter for follow-up examination after completed treatment for conditions other than malignant neoplasm: Secondary | ICD-10-CM

## 2023-01-31 MED ORDER — OZEMPIC (0.25 OR 0.5 MG/DOSE) 2 MG/3ML ~~LOC~~ SOPN
0.2500 mg | PEN_INJECTOR | SUBCUTANEOUS | 0 refills | Status: DC
  Filled 2023-01-31: qty 3, 28d supply, fill #0

## 2023-01-31 NOTE — Progress Notes (Signed)
Patient presents for  postop from [facility], bilateral salpingectomy, cystoscopy. On 1029/24 Returned to work yesterday and was up and down all down from the chair and noticed she was more tired and had pink spotting with wiping this AM. 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 Otherwise, she is doing much better and denies any severe abdominal pain or fevers.  BP 120/78   Pulse 64   Resp 16   LMP 12/04/2022 (Exact Date)    last UC Blood pressure 120/78, pulse 64, resp. rate 16, last menstrual period 12/04/2022.   PO from [facility] 10/29 Continue pelvic rest Begin estrace PV to aid in vaginal cuff healing No heavy lifting/pushing or pulling. Encouraged more lighter duties at work at least for the 8 weeks RTC for 10 wk PO or sooner with any concerns  Dr. Karma Greaser

## 2023-02-02 ENCOUNTER — Other Ambulatory Visit (HOSPITAL_COMMUNITY): Payer: Self-pay

## 2023-02-16 ENCOUNTER — Encounter: Payer: Self-pay | Admitting: Family Medicine

## 2023-02-16 ENCOUNTER — Ambulatory Visit: Payer: 59 | Admitting: Family Medicine

## 2023-02-16 VITALS — HR 85 | Temp 97.3°F | Ht 63.0 in | Wt 179.0 lb

## 2023-02-16 DIAGNOSIS — M545 Low back pain, unspecified: Secondary | ICD-10-CM | POA: Diagnosis not present

## 2023-02-16 LAB — POCT URINALYSIS DIP (CLINITEK)
Blood, UA: NEGATIVE
Glucose, UA: NEGATIVE mg/dL
Ketones, POC UA: NEGATIVE mg/dL
Leukocytes, UA: NEGATIVE
Nitrite, UA: NEGATIVE
Spec Grav, UA: 1.025 (ref 1.010–1.025)
Urobilinogen, UA: 0.2 U/dL
pH, UA: 6 (ref 5.0–8.0)

## 2023-02-16 MED ORDER — HYDROCODONE-ACETAMINOPHEN 5-325 MG PO TABS: 1.0000 | ORAL_TABLET | Freq: Two times a day (BID) | ORAL | 0 refills | Status: AC | PRN

## 2023-02-16 NOTE — Progress Notes (Signed)
Acute Office Visit  Subjective:    Patient ID: Michelle Lamb, female    DOB: 08/19/1990, 32 y.o.   MRN: [id number]  Chief Complaint  Patient presents with   Back Pain    Discussed the use of AI scribe software for clinical note transcription with the patient, who gave verbal consent to proceed.   HPI: Patient is in today for Lower abdominal and back pain. Patient stated that pain woke her up out her sleep last night and she applied heat to it and took Tylenol but did not help.  The patient, with a history of partial hysterectomy, presents with sharp, non-radiating abdominal pain that woke her up. The pain is described as pressure and is not relieved by Tylenol. The patient denies any issues with the surgical incision and does not feel anything unusual in the area. The pain is severe enough to cause nausea and is not relieved by a heating pad. The patient also reports back pain that comes and goes but seemed worse the previous day. The patient denies any sexual activity since the surgery and reports normal bowel movements. The patient also reports a history of urinary tract infections but denies any current symptoms. The patient has a history of allergic reactions to several pain medications, including Dilaudid, which caused a rash. The patient has a history of kidney and liver issues, which limits her options for pain management.  Abdominal Pain  She reports new onset abdominal pain. The most recent episode started last night and is staying constant. The abdominal pain is located in the lower abdomen and does not radiate. It is described as pressure-like and sharp, is 8/10 in intensity, occurring constantly. It is aggravated by moving and Siting, bending  and is relieved by nothing. She has tried acetaminophen and Applying heat with no relief.    Recent GI studies:colonoscopy and upper GI endoscopy Relevant medical history includes:    Previous labs Lab Results  Component  Value Date   WBC 10.8 (H) 12/23/2022   HGB 11.9 (L) 12/27/2022   HCT 35.0 (L) 12/27/2022   MCV 89.3 12/23/2022   MCH 28.3 12/23/2022   RDW 13.0 12/23/2022   PLT 312 12/23/2022   Lab Results  Component Value Date   GLUCOSE 75 12/27/2022   NA 139 12/27/2022   K 3.9 12/27/2022   CL 101 12/27/2022   CO2 23 12/07/2022   BUN 16 12/27/2022   CREATININE 0.60 12/27/2022   GFRNONAA >60 03/15/2022   GFRAA >60 11/27/2015   CALCIUM 8.9 12/07/2022   PROT 6.3 12/07/2022   ALBUMIN 4.3 12/07/2022   LABGLOB 2.0 12/07/2022   AGRATIO 1.8 04/05/2022   BILITOT 0.7 12/07/2022   ALKPHOS 86 12/07/2022   AST 19 12/07/2022   ALT 23 12/07/2022   ANIONGAP 8 03/15/2022     Past Medical History:  Diagnosis Date   Abnormal menses 08/22/2022   Absolute anemia 10/12/2021   Acute cystitis with hematuria 12/22/2020   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   Blood transfusion without reported diagnosis 2018   postpartum   Chest pain of uncertain etiology 06/08/2022   06/15/22 Myocardial perfusion - normal, 09/15/22 long term heart monitor in Epic   Chronic kidney disease    right kidney at 35%  Complication of anesthesia    Slow to wake up   Constipation 06/28/2022   Diabetes mellitus without complication (HCC)    NOT AN ISSUE SINCE GASTRIC BYPASS IN 2022. TYPE 2 LAST DOSE FRIDAY December 24, 2020 (METFORMIN)   Dizziness 08/22/2022   Dizziness 2024   Follows w/ PCP and cardiology.   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  Report date is 02/23/2022.      The CancerNext-Expanded gene panel offered by [facility] and includes sequencing, rearrangement, and RNA analysis for  the following 77 genes: AIP, ALK, APC, ATM, AXIN2, BAP1, BARD1, BLM, BMPR1A, BRCA1, BRCA2, BRIP1, CDC73, CDH1, CDK4, CDKN1B, CDKN2A, CHEK2, CTNNA1, DICER1, FANCC, FH, FLCN, GALNT12, KIF1B, LZTR1,   Heart murmur    as a baby   Hepatic steatosis 09/08/2021   History of herpes simplex infection    History of partial nephrectomy 03/21/2022   Formatting of this note might be different from the original. 03/15/2021: Underwent right partial nephrectomy by Dr. Laverle Patter for right renal neoplasm. Pathology: Angiomyolipoma. Formatting of this note might be different from the original. 03/15/2021: Underwent right partial nephrectomy by Dr. Laverle Patter for right renal neoplasm. Pathology: Angiomyolipoma.   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   Morbid obesity (HCC) 06/02/2020   Neoplasm of right kidney    benign tumor of kidney   Obesity (BMI 30.0-34.9) 06/08/2022   Obstructive sleep apnea 10/29/2019   hx of before gastric bypass, no longer uses CPAP   Racing heart beat 08/22/2022   see 09/15/22 Long term heart monitor in Epic   Seasonal allergies    Ureteral obstruction, right 07/15/2021   Vaginal delivery 2009, 2010, 2015   Vitamin D insufficiency 09/30/2020   Weight loss 09/30/2020    Past Surgical History:  Procedure Laterality Date   CESAREAN SECTION  09/29/2016   CHOLECYSTECTOMY     COLONOSCOPY WITH ESOPHAGOGASTRODUODENOSCOPY (EGD)  07/22/2022   CYSTOSCOPY N/A 12/27/2022   Procedure: CYSTOSCOPY;  Surgeon: Earley Favor, MD;  Location: [facility];  Service: Gynecology;  Laterality: N/A;   CYSTOSCOPY W/ URETERAL STENT PLACEMENT Right  07/15/2021   Procedure: CYSTOSCOPY WITH RETROGRADE PYELOGRAM/URETERAL STENT PLACEMENT;  Surgeon: Heloise Purpura, MD;  Location: [facility];  Service: Urology;  Laterality: Right;   CYSTOSCOPY WITH RETROGRADE PYELOGRAM, URETEROSCOPY AND STENT PLACEMENT Right 05/03/2021   Procedure: CYSTOSCOPY WITH RIGHT RETROGRADE PYELOGRAM, URETEROSCOPY;  Surgeon: Heloise Purpura, MD;  Location: [facility];  Service: Urology;  Laterality: Right;   DILATION AND CURETTAGE OF UTERUS N/A 12/31/2015   Procedure: SUCTION DILATATION AND CURETTAGE;  Surgeon: [place] Bing, MD;  Location: [facility];  Service: Gynecology;  Laterality: N/A;   DILATION AND EVACUATION N/A 01/01/2016   Procedure: DILATATION AND EVACUATION;  Surgeon: Tilda Burrow, MD;  Location: [facility];  Service: Gynecology;  Laterality: N/A;   ESOPHAGOGASTRODUODENOSCOPY     GASTRIC BYPASS  06/02/2020   IR NEPHRO TUBE REMOV/FL  07/16/2021   IR NEPHROSTOMY EXCHANGE RIGHT  05/12/2021   IR NEPHROSTOMY PLACEMENT RIGHT  05/05/2021   ROBOT ASSISTED  PYELOPLASTY Right 07/15/2021   Procedure: XI ROBOTIC ASSISTED  LAPAROSCOPIC PYELOPLASTY;  Surgeon: Heloise Purpura, MD;  Location: [facility];  Service: Urology;  Laterality: Right;   ROBOTIC ASSISTED LAPAROSCOPIC HYSTERECTOMY AND SALPINGECTOMY Bilateral 12/27/2022   Procedure: XI ROBOTIC ASSISTED LAPAROSCOPIC HYSTERECTOMY AND SALPINGECTOMY;  Surgeon: Earley Favor, MD;  Location: [facility];  Service: Gynecology;  Laterality: Bilateral;   ROBOTIC ASSITED PARTIAL NEPHRECTOMY Right 03/15/2021   Procedure: XI ROBOTIC ASSITED PARTIAL NEPHRECTOMY;  Surgeon: Heloise Purpura, MD;  Location: [facility];  Service: Urology;  Laterality: Right;   TONSILLECTOMY     TUBAL LIGATION     UPPER GASTROINTESTINAL ENDOSCOPY     w/dilation    Family History  Problem Relation Age of Onset   Hypertension Mother    Thyroid disease Mother    Cervical cancer Mother        dx 15s   Stomach cancer Maternal Grandmother        dx 18s    Leukemia Maternal Grandfather        d. 80s   Depression Paternal Grandmother    Cancer Paternal Grandfather        dx after 18; mets   Breast cancer Maternal Great-grandmother        dx <50; mets; MGF's mother   Stomach cancer Other        MGM's brother; dx after 78   Cancer Other        MGF's sisters x3; unknown primary   Colon cancer Neg Hx    Colon polyps Neg Hx    Esophageal cancer Neg Hx    Rectal cancer Neg Hx     Social History   Socioeconomic History   Marital status: Married    Spouse name: Not on file   Number of children: 9   Years of education: 13   Highest education level: Tax adviser degree: occupational, Scientist, product/process development, or vocational program  Occupational History   Occupation: Scientist, forensic  Tobacco Use   Smoking status: Never   Smokeless tobacco: Never  Vaping Use   Vaping status: Never Used  Substance and Sexual Activity   Alcohol use: Yes    Comment: occasionally   Drug use: Never   Sexual activity: Not Currently    Partners: Male    Birth control/protection: Surgical    Comment: tubal, hysterectomy  Other Topics Concern   Not on file  Social History Narrative   Not on file   Social Drivers of Health   Financial Resource Strain: Low Risk  (08/21/2022)   Overall Financial Resource Strain (CARDIA)    Difficulty of Paying Living Expenses: Not hard at all  Food Insecurity: No Food Insecurity (08/21/2022)   Hunger Vital Sign    Worried About Running Out of Food in the Last Year: Never true    Ran Out of Food in the Last Year: Never true  Transportation Needs: No Transportation Needs (08/21/2022)   PRAPARE - Administrator, Civil Service (Medical): No    Lack of Transportation (Non-Medical): No  Physical Activity: Insufficiently Active (08/21/2022)   Exercise Vital Sign    Days of Exercise per Week: 3 days    Minutes of Exercise per Session: 40 min  Stress: Stress Concern Present (08/21/2022)   [employer] of  Occupational Health - Occupational Stress Questionnaire    Feeling of Stress : To some extent  Social Connections: Socially Integrated (08/21/2022)   Social Connection and Isolation Panel [NHANES]  Frequency of Communication with Friends and Family: More than three times a week    Frequency of Social Gatherings with Friends and Family: More than three times a week    Attends Religious Services: 1 to 4 times per year    Active Member of [employer] or Organizations: Yes    Attends Engineer, structural: More than 4 times per year    Marital Status: Married  Catering manager Violence: Not on file    Outpatient Medications Prior to Visit  Medication Sig Dispense Refill   acetaminophen (TYLENOL) 500 MG tablet Take 1 tablet (500 mg total) by mouth every 6 (six) hours as needed. 30 tablet 0   albuterol (VENTOLIN HFA) 108 (90 Base) MCG/ACT inhaler Inhale 2 puffs into the lungs every 6 (six) hours as needed for wheezing or shortness of breath. 1 each 5   cyanocobalamin (VITAMIN B12) 1000 MCG/ML injection Inject 1 ML as directed once a month 1 mL 2   estradiol (ESTRACE VAGINAL) 0.1 MG/GM vaginal cream Rub pea size amount each night for 3 weeks then 3 times a week thereafter. 42.5 g 0   fluticasone (FLONASE) 50 MCG/ACT nasal spray Place 2 sprays into both nostrils daily. (Patient taking differently: Place 2 sprays into both nostrils as needed for allergies or rhinitis.) 16 g 6   linaclotide (LINZESS) 145 MCG CAPS capsule Take 145 mcg by mouth daily before breakfast.     lisdexamfetamine (VYVANSE) 50 MG capsule Take 1 capsule (50 mg total) by mouth daily. 30 capsule 0   LORazepam (ATIVAN) 1 MG tablet Take 1 tablet (1 mg total) by mouth daily as needed. 30 tablet 1   meclizine (ANTIVERT) 25 MG tablet Take 1 tablet (25 mg total) by mouth 3 (three) times daily as needed for dizziness. 30 tablet 0   Multiple Vitamin (MULTIVITAMIN WITH MINERALS) TABS tablet Take 1 tablet by mouth daily.     ondansetron  (ZOFRAN-ODT) 4 MG disintegrating tablet Take 1 tablet (4 mg total) by mouth every 8 (eight) hours as needed for nausea or vomiting. 90 tablet 1   pantoprazole (PROTONIX) 40 MG tablet Take 40 mg by mouth daily.     Semaglutide,0.25 or 0.5MG /DOS, (OZEMPIC, 0.25 OR 0.5 MG/DOSE,) 2 MG/3ML SOPN Inject 0.25 mg into the skin once a week. 3 mL 0   venlafaxine XR (EFFEXOR-XR) 75 MG 24 hr capsule Take 1 capsule (75 mg total) by mouth daily with breakfast. 30 capsule 2   Vitamin D, Ergocalciferol, (DRISDOL) 1.25 MG (50000 UNIT) CAPS capsule Take 50,000 Units by mouth 2 (two) times a week.     No facility-administered medications prior to visit.    Allergies  Allergen Reactions   Oxy Ir [Oxycodone] Itching   Dilaudid [Hydromorphone] Rash    Review of Systems  Constitutional:  Negative for chills and fever.  HENT:  Negative for congestion and sore throat.   Respiratory:  Negative for cough and shortness of breath.   Cardiovascular:  Negative for chest pain.  Gastrointestinal:  Positive for abdominal pain (Lower). Negative for nausea.  Genitourinary:  Negative for dysuria, flank pain, frequency and urgency.  Musculoskeletal:  Positive for back pain (Lower).       Objective:        02/16/2023    3:45 PM 01/31/2023   11:27 AM 01/18/2023    2:20 PM  Vitals with BMI  Height 5\' 3"     Weight 179 lbs    BMI 31.72    Systolic  120 114  Diastolic  78 72  Pulse 85 64 68    No data found.   Physical Exam Vitals reviewed. Exam conducted with a chaperone present.  Constitutional:      General: She is not in acute distress.    Appearance: Normal appearance.  Cardiovascular:     Rate and Rhythm: Normal rate and regular rhythm.     Heart sounds: Normal heart sounds.  Pulmonary:     Effort: Pulmonary effort is normal.     Breath sounds: Normal breath sounds. No wheezing.  Chest:  Breasts:    Breasts are symmetrical.     Right: Normal.     Left: Normal.  Abdominal:     General: Bowel  sounds are normal.     Palpations: Abdomen is soft.     Tenderness: There is abdominal tenderness.  Genitourinary:    General: Normal vulva.     Exam position: Lithotomy position.     Labia:        Right: No rash.        Left: No rash.      Vagina: Normal.     Cervix: Normal. No friability, erythema or cervical bleeding.     Rectum: Normal.  Skin:    General: Skin is warm.  Neurological:     Mental Status: She is alert. Mental status is at baseline.  Psychiatric:        Mood and Affect: Mood normal.        Behavior: Behavior normal.     Health Maintenance Due  Topic Date Due   Cervical Cancer Screening (HPV/Pap Cotest)  Never done    There are no preventive care reminders to display for this patient.   Lab Results  Component Value Date   TSH 1.080 12/07/2022   Lab Results  Component Value Date   WBC 10.8 (H) 12/23/2022   HGB 11.9 (L) 12/27/2022   HCT 35.0 (L) 12/27/2022   MCV 89.3 12/23/2022   PLT 312 12/23/2022   Lab Results  Component Value Date   NA 139 12/27/2022   K 3.9 12/27/2022   CO2 23 12/07/2022   GLUCOSE 75 12/27/2022   BUN 16 12/27/2022   CREATININE 0.60 12/27/2022   BILITOT 0.7 12/07/2022   ALKPHOS 86 12/07/2022   AST 19 12/07/2022   ALT 23 12/07/2022   PROT 6.3 12/07/2022   ALBUMIN 4.3 12/07/2022   CALCIUM 8.9 12/07/2022   ANIONGAP 8 03/15/2022   EGFR 119 12/07/2022   Lab Results  Component Value Date   CHOL 184 08/17/2022   Lab Results  Component Value Date   HDL 74 08/17/2022   Lab Results  Component Value Date   LDLCALC 99 08/17/2022   Lab Results  Component Value Date   TRIG 58 08/17/2022   Lab Results  Component Value Date   CHOLHDL 2.5 08/17/2022   Lab Results  Component Value Date   HGBA1C 5.3 08/17/2022       Assessment & Plan:  Acute bilateral low back pain without sciatica Assessment & Plan: Acute Persistent sharp pain and pressure in the lower abdomen. No signs of infection or wound dehiscence on  examination. Possible scar tissue formation. -Continue Tylenol as tolerated for pain management. -Consider reducing work hours to allow for further recovery. History of adverse reactions to multiple pain medications. Limited options due to single kidney and fatty liver. -Consider low-dose Norco for pain management, pending tolerance and effectiveness.   Orders: -     POCT URINALYSIS  DIP (CLINITEK) -     Urine Culture -     HYDROcodone-Acetaminophen; Take 1 tablet by mouth 2 (two) times daily as needed for up to 5 days for severe pain (pain score 7-10) (OSteoarthritis of lumbar spine.).  Dispense: 10 tablet; Refill: 0     Meds ordered this encounter  Medications   HYDROcodone-acetaminophen (NORCO/VICODIN) 5-325 MG tablet    Sig: Take 1 tablet by mouth 2 (two) times daily as needed for up to 5 days for severe pain (pain score 7-10) (OSteoarthritis of lumbar spine.).    Dispense:  10 tablet    Refill:  0    Orders Placed This Encounter  Procedures   Urine Culture   POCT URINALYSIS DIP (CLINITEK)     Follow-up: Return if symptoms worsen or fail to improve.  An After Visit Summary was printed and given to the patient.  Lajuana Matte, FNP [facility] [phone]

## 2023-02-18 DIAGNOSIS — M545 Low back pain, unspecified: Secondary | ICD-10-CM | POA: Insufficient documentation

## 2023-02-18 NOTE — Assessment & Plan Note (Addendum)
Acute Persistent sharp pain and pressure in the lower abdomen. No signs of infection or wound dehiscence on examination. Possible scar tissue formation. -Continue Tylenol as tolerated for pain management. -Consider reducing work hours to allow for further recovery. History of adverse reactions to multiple pain medications. Limited options due to single kidney and fatty liver. -Consider low-dose Norco for pain management, pending tolerance and effectiveness.

## 2023-02-22 ENCOUNTER — Other Ambulatory Visit: Payer: Self-pay | Admitting: Family Medicine

## 2023-02-22 DIAGNOSIS — N3000 Acute cystitis without hematuria: Secondary | ICD-10-CM

## 2023-02-22 LAB — URINE CULTURE

## 2023-02-22 MED ORDER — AMOXICILLIN 875 MG PO TABS: 875.0000 mg | ORAL_TABLET | Freq: Two times a day (BID) | ORAL | 0 refills | Status: DC

## 2023-02-22 MED ORDER — FLUCONAZOLE 150 MG PO TABS: 150.0000 mg | ORAL_TABLET | Freq: Every day | ORAL | 0 refills | Status: AC

## 2023-02-28 ENCOUNTER — Other Ambulatory Visit: Payer: Self-pay

## 2023-02-28 ENCOUNTER — Other Ambulatory Visit (HOSPITAL_BASED_OUTPATIENT_CLINIC_OR_DEPARTMENT_OTHER): Payer: Self-pay

## 2023-02-28 DIAGNOSIS — D513 Other dietary vitamin B12 deficiency anemia: Secondary | ICD-10-CM

## 2023-02-28 DIAGNOSIS — F418 Other specified anxiety disorders: Secondary | ICD-10-CM

## 2023-02-28 MED ORDER — LISDEXAMFETAMINE DIMESYLATE 50 MG PO CAPS
50.0000 mg | ORAL_CAPSULE | Freq: Every day | ORAL | 0 refills | Status: DC
  Filled 2023-02-28: qty 30, 30d supply, fill #0

## 2023-02-28 MED ORDER — VENLAFAXINE HCL ER 75 MG PO CP24
75.0000 mg | ORAL_CAPSULE | Freq: Every day | ORAL | 2 refills | Status: DC
  Filled 2023-02-28: qty 30, 30d supply, fill #0

## 2023-02-28 MED ORDER — VITAMIN D (ERGOCALCIFEROL) 1.25 MG (50000 UNIT) PO CAPS
50000.0000 [IU] | ORAL_CAPSULE | ORAL | 2 refills | Status: DC
  Filled 2023-02-28: qty 8, 28d supply, fill #0

## 2023-02-28 MED ORDER — CYANOCOBALAMIN 1000 MCG/ML IJ SOLN
INTRAMUSCULAR | 2 refills | Status: DC
  Filled 2023-02-28: qty 1, 30d supply, fill #0

## 2023-02-28 MED ORDER — LORAZEPAM 1 MG PO TABS
1.0000 mg | ORAL_TABLET | Freq: Every day | ORAL | 1 refills | Status: DC | PRN
  Filled 2023-02-28: qty 30, 30d supply, fill #0

## 2023-03-01 ENCOUNTER — Other Ambulatory Visit (HOSPITAL_BASED_OUTPATIENT_CLINIC_OR_DEPARTMENT_OTHER): Payer: Self-pay

## 2023-03-02 ENCOUNTER — Other Ambulatory Visit (HOSPITAL_BASED_OUTPATIENT_CLINIC_OR_DEPARTMENT_OTHER): Payer: Self-pay

## 2023-03-02 ENCOUNTER — Encounter: Payer: Self-pay | Admitting: Hematology and Oncology

## 2023-03-02 ENCOUNTER — Other Ambulatory Visit: Payer: Self-pay | Admitting: Family Medicine

## 2023-03-02 DIAGNOSIS — N3000 Acute cystitis without hematuria: Secondary | ICD-10-CM

## 2023-03-02 MED ORDER — AMOXICILLIN 875 MG PO TABS
875.0000 mg | ORAL_TABLET | Freq: Two times a day (BID) | ORAL | 0 refills | Status: DC
  Filled 2023-03-02: qty 20, 10d supply, fill #0

## 2023-03-02 MED ORDER — FLUCONAZOLE 150 MG PO TABS
150.0000 mg | ORAL_TABLET | Freq: Every day | ORAL | 0 refills | Status: AC
  Filled 2023-03-02: qty 1, 1d supply, fill #0

## 2023-03-03 ENCOUNTER — Other Ambulatory Visit: Payer: Self-pay | Admitting: Family Medicine

## 2023-03-03 ENCOUNTER — Other Ambulatory Visit (HOSPITAL_BASED_OUTPATIENT_CLINIC_OR_DEPARTMENT_OTHER): Payer: Self-pay

## 2023-03-03 MED ORDER — HYDROCODONE-ACETAMINOPHEN 10-325 MG PO TABS
1.0000 | ORAL_TABLET | Freq: Three times a day (TID) | ORAL | 0 refills | Status: DC | PRN
  Filled 2023-03-03: qty 15, 5d supply, fill #0

## 2023-03-07 ENCOUNTER — Encounter: Payer: Self-pay | Admitting: Hematology and Oncology

## 2023-03-07 ENCOUNTER — Encounter: Payer: 59 | Admitting: Obstetrics and Gynecology

## 2023-03-09 ENCOUNTER — Ambulatory Visit (INDEPENDENT_AMBULATORY_CARE_PROVIDER_SITE_OTHER): Payer: 59 | Admitting: Obstetrics and Gynecology

## 2023-03-09 ENCOUNTER — Encounter: Payer: Self-pay | Admitting: Obstetrics and Gynecology

## 2023-03-09 ENCOUNTER — Other Ambulatory Visit (HOSPITAL_BASED_OUTPATIENT_CLINIC_OR_DEPARTMENT_OTHER): Payer: Self-pay

## 2023-03-09 ENCOUNTER — Encounter: Payer: Self-pay | Admitting: Hematology and Oncology

## 2023-03-09 VITALS — BP 114/70 | HR 92

## 2023-03-09 DIAGNOSIS — Z09 Encounter for follow-up examination after completed treatment for conditions other than malignant neoplasm: Secondary | ICD-10-CM

## 2023-03-09 MED ORDER — FLUCONAZOLE 150 MG PO TABS
150.0000 mg | ORAL_TABLET | Freq: Once | ORAL | 0 refills | Status: AC
  Filled 2023-03-09 – 2023-04-06 (×3): qty 1, 1d supply, fill #0

## 2023-03-09 NOTE — Progress Notes (Signed)
 Patient presents for 10 week postop from [facility], bilateral salpingectomy, cystoscopy. She is doing well. No fevers, VB, dysuria or severe abdominal pain. Recent amoxicillin  for UTI now with watery stool. No blood in her stools.   BP 114/70   Pulse 92   LMP 12/04/2022 (Exact Date)   SpO2 99%   SVE: sutures seen and easily removed with qtip, normal discharge, no bleeding. Cuff intact  A/p PO from [facility] 10 weeks doing well Resume all activities 2. Pelvic rest for additional 1-2 wks 3. Begin kefer yogurt 5oz twice daily to replenish gut flora and improve diarrhea.  To continue to follow with PMD for this.  Dr. Glennon

## 2023-03-20 ENCOUNTER — Other Ambulatory Visit (HOSPITAL_BASED_OUTPATIENT_CLINIC_OR_DEPARTMENT_OTHER): Payer: Self-pay

## 2023-03-22 ENCOUNTER — Other Ambulatory Visit: Payer: Self-pay | Admitting: Family Medicine

## 2023-03-22 ENCOUNTER — Telehealth: Payer: 59 | Admitting: Family Medicine

## 2023-03-22 ENCOUNTER — Encounter: Payer: Self-pay | Admitting: Family Medicine

## 2023-03-22 ENCOUNTER — Other Ambulatory Visit (HOSPITAL_BASED_OUTPATIENT_CLINIC_OR_DEPARTMENT_OTHER): Payer: Self-pay

## 2023-03-22 ENCOUNTER — Encounter: Payer: Self-pay | Admitting: Hematology and Oncology

## 2023-03-22 DIAGNOSIS — J101 Influenza due to other identified influenza virus with other respiratory manifestations: Secondary | ICD-10-CM | POA: Diagnosis not present

## 2023-03-22 MED ORDER — XOFLUZA (80 MG DOSE) 1 X 80 MG PO TBPK
80.0000 mg | ORAL_TABLET | Freq: Once | ORAL | 0 refills | Status: DC
  Filled 2023-03-22: qty 1, 1d supply, fill #0

## 2023-03-22 MED ORDER — XOFLUZA (80 MG DOSE) 1 X 80 MG PO TBPK: 80.0000 mg | ORAL_TABLET | Freq: Once | ORAL | 0 refills | Status: AC

## 2023-03-22 NOTE — Assessment & Plan Note (Signed)
Discussed the benefits of antiviral therapy (shortens course) and the need for symptomatic treatment for the headache. - sent Xofluza -Continue Tylenol for headache.

## 2023-03-22 NOTE — Progress Notes (Signed)
Virtual Visit via Video Note   This visit type was conducted per patient request This format is felt to be appropriate for this patient at this time.  All issues noted in this document were discussed and addressed.  A limited physical exam was performed with this format.  A verbal consent was obtained for the virtual visit.   Date:  03/22/2023   ID:  Michelle Lamb, DOB 1990/09/02, MRN [id number]  Patient Location: Home Provider Location: Home Office  PCP:  Marianne Sofia, PA-C   Chief Complaint:  flu  Discussed the use of AI scribe software for clinical note transcription with the patient, who gave verbal consent to proceed.  History of Present Illness             History of Present Illness:    Michelle Lamb is a 33 y.o. female with headache  Chief Complaint  Patient presents with   Influenza  . History of Present Illness The patient presents with flu-like symptoms, including a headache, which she describes as being located 'right here behind your eyes.' She has taken Tylenol for the headache, but it has not provided significant relief. She has previously taken Tamiflu which "tore her stomach up." The patient has a history of gastric bypass surgery, which may limit her options for over-the-counter pain relief. She has not reported any fever, coughing, nausea, or vomiting. She has been having some diarrhea since last week, but had started to ease up and then came back last night.  Patient took a home covid and flu test and it was positive for influenza A AND B.   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   Blood transfusion without reported diagnosis 2018   postpartum   Chest pain of uncertain etiology 06/08/2022   06/15/22 Myocardial perfusion - normal, 09/15/22 long term heart monitor in Epic   Chronic kidney disease    right kidney at 35%   Complication of anesthesia    Slow to wake up   Constipation 06/28/2022   Diabetes mellitus without complication (HCC)    NOT AN ISSUE SINCE GASTRIC BYPASS IN 2022. TYPE 2 LAST DOSE FRIDAY December 24, 2020 (METFORMIN)   Dizziness 08/22/2022   Dizziness 2024   Follows w/ PCP and cardiology.   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  Report date is 02/23/2022.      The CancerNext-Expanded gene panel offered by [facility] and includes sequencing, rearrangement, and RNA analysis for the following 77 genes: AIP, ALK, APC, ATM, AXIN2, BAP1, BARD1, BLM, BMPR1A, BRCA1, BRCA2, BRIP1, CDC73, CDH1, CDK4, CDKN1B, CDKN2A, CHEK2, CTNNA1, DICER1, FANCC, FH, FLCN, GALNT12, KIF1B, LZTR1,   Heart murmur    as a baby   Hepatic steatosis 09/08/2021   History of herpes simplex infection    History of partial nephrectomy 03/21/2022   Formatting of this note might be different from the original. 03/15/2021: Underwent right partial nephrectomy by Dr. Laverle Patter for right renal neoplasm. Pathology: Angiomyolipoma. Formatting of this note might be different from the original. 03/15/2021: Underwent  right partial nephrectomy by Dr. Laverle Patter for right renal neoplasm. Pathology: Angiomyolipoma.   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    Morbid obesity (HCC) 06/02/2020   Neoplasm of right kidney    benign tumor of kidney   Obesity (BMI 30.0-34.9) 06/08/2022   Obstructive sleep apnea 10/29/2019   hx of before gastric bypass, no longer uses CPAP   Racing heart beat 08/22/2022   see 09/15/22 Long term heart monitor in Epic   Seasonal allergies    Ureteral obstruction, right 07/15/2021   Vaginal delivery 2009, 2010, 2015   Vitamin D insufficiency 09/30/2020   Weight loss 09/30/2020      Past Surgical History:  Procedure Laterality Date   CESAREAN SECTION  09/29/2016   CHOLECYSTECTOMY     COLONOSCOPY WITH ESOPHAGOGASTRODUODENOSCOPY (EGD)  07/22/2022   CYSTOSCOPY N/A 12/27/2022   Procedure: CYSTOSCOPY;  Surgeon: Earley Favor, MD;  Location: [facility];  Service: Gynecology;  Laterality: N/A;   CYSTOSCOPY W/ URETERAL STENT PLACEMENT Right 07/15/2021   Procedure: CYSTOSCOPY WITH RETROGRADE PYELOGRAM/URETERAL STENT PLACEMENT;  Surgeon: Heloise Purpura, MD;  Location: [facility];  Service: Urology;  Laterality: Right;   CYSTOSCOPY WITH RETROGRADE PYELOGRAM, URETEROSCOPY AND STENT PLACEMENT Right 05/03/2021   Procedure: CYSTOSCOPY WITH RIGHT RETROGRADE PYELOGRAM, URETEROSCOPY;  Surgeon: Heloise Purpura, MD;  Location: [facility];  Service: Urology;  Laterality: Right;   DILATION AND CURETTAGE OF UTERUS N/A 12/31/2015   Procedure: SUCTION DILATATION AND CURETTAGE;  Surgeon: Buckner Bing, MD;  Location: [facility];  Service: Gynecology;  Laterality: N/A;   DILATION AND EVACUATION N/A 01/01/2016   Procedure: DILATATION AND EVACUATION;  Surgeon: Tilda Burrow, MD;  Location: [facility];  Service: Gynecology;  Laterality: N/A;   ESOPHAGOGASTRODUODENOSCOPY     GASTRIC BYPASS  06/02/2020   IR NEPHRO TUBE REMOV/FL  07/16/2021   IR NEPHROSTOMY EXCHANGE RIGHT  05/12/2021   IR NEPHROSTOMY PLACEMENT RIGHT  05/05/2021   ROBOT ASSISTED PYELOPLASTY Right 07/15/2021   Procedure: XI ROBOTIC ASSISTED  LAPAROSCOPIC PYELOPLASTY;   Surgeon: Heloise Purpura, MD;  Location: [facility];  Service: Urology;  Laterality: Right;   ROBOTIC ASSISTED LAPAROSCOPIC HYSTERECTOMY AND SALPINGECTOMY Bilateral 12/27/2022   Procedure: XI ROBOTIC ASSISTED LAPAROSCOPIC HYSTERECTOMY AND SALPINGECTOMY;  Surgeon: Earley Favor, MD;  Location[facility];  Service: Gynecology;  Laterality: Bilateral;   ROBOTIC ASSITED PARTIAL NEPHRECTOMY Right 03/15/2021   Procedure: XI ROBOTIC ASSITED PARTIAL NEPHRECTOMY;  Surgeon: Heloise Purpura, MD;  Location: [facility];  Service: Urology;  Laterality: Right;   TONSILLECTOMY     TUBAL LIGATION     UPPER GASTROINTESTINAL ENDOSCOPY     w/dilation    Family History  Problem Relation Age of Onset   Hypertension Mother    Thyroid disease Mother    Cervical cancer Mother        dx 45s   Stomach cancer Maternal Grandmother        dx 42s   Leukemia Maternal Grandfather        d. 23s   Depression Paternal Grandmother    Cancer Paternal Grandfather        dx  after 50; mets   Breast cancer Maternal Great-grandmother        dx <50; mets; MGF's mother   Stomach cancer Other        MGM's brother; dx after 18   Cancer Other        MGF's sisters x3; unknown primary   Colon cancer Neg Hx    Colon polyps Neg Hx    Esophageal cancer Neg Hx    Rectal cancer Neg Hx     Social History   Socioeconomic History   Marital status: Married    Spouse name: Not on file   Number of children: 9   Years of education: 13   Highest education level: Associate degree: occupational, Scientist, product/process development, or vocational program  Occupational History   Occupation: Scientist, forensic  Tobacco Use   Smoking status: Never   Smokeless tobacco: Never  Vaping Use   Vaping status: Never Used  Substance and Sexual Activity   Alcohol use: Yes    Comment: occasionally   Drug use: Never   Sexual activity: Not Currently    Partners: Male    Birth control/protection: Surgical    Comment: tubal, hysterectomy   Other Topics Concern   Not on file  Social History Narrative   Not on file   Social Drivers of Health   Financial Resource Strain: Low Risk  (08/21/2022)   Overall Financial Resource Strain (CARDIA)    Difficulty of Paying Living Expenses: Not hard at all  Food Insecurity: No Food Insecurity (08/21/2022)   Hunger Vital Sign    Worried About Running Out of Food in the Last Year: Never true    Ran Out of Food in the Last Year: Never true  Transportation Needs: No Transportation Needs (08/21/2022)   PRAPARE - Administrator, Civil Service (Medical): No    Lack of Transportation (Non-Medical): No  Physical Activity: Insufficiently Active (08/21/2022)   Exercise Vital Sign    Days of Exercise per Week: 3 days    Minutes of Exercise per Session: 40 min  Stress: Stress Concern Present (08/21/2022)   [employer] of Occupational Health - Occupational Stress Questionnaire    Feeling of Stress : To some extent  Social Connections: Socially Integrated (08/21/2022)   Social Connection and Isolation Panel [NHANES]    Frequency of Communication with Friends and Family: More than three times a week    Frequency of Social Gatherings with Friends and Family: More than three times a week    Attends Religious Services: 1 to 4 times per year    Active Member of [employer] or Organizations: Yes    Attends Engineer, structural: More than 4 times per year    Marital Status: Married  Catering manager Violence: Not on file    Outpatient Medications Prior to Visit  Medication Sig Dispense Refill   acetaminophen (TYLENOL) 500 MG tablet Take 1 tablet (500 mg total) by mouth every 6 (six) hours as needed. 30 tablet 0   albuterol (VENTOLIN HFA) 108 (90 Base) MCG/ACT inhaler Inhale 2 puffs into the lungs every 6 (six) hours as needed for wheezing or shortness of breath. 1 each 5   cyanocobalamin (VITAMIN B12) 1000 MCG/ML injection Inject 1 ML as directed once a month 1 mL 2   estradiol  (ESTRACE VAGINAL) 0.1 MG/GM vaginal cream Rub pea size amount each night for 3 weeks then 3 times a week thereafter. (Patient not taking: Reported on 03/09/2023) 42.5 g 0  fluticasone (FLONASE) 50 MCG/ACT nasal spray Place 2 sprays into both nostrils daily. (Patient taking differently: Place 2 sprays into both nostrils as needed for allergies or rhinitis.) 16 g 6   linaclotide (LINZESS) 145 MCG CAPS capsule Take 145 mcg by mouth daily before breakfast.     lisdexamfetamine (VYVANSE) 50 MG capsule Take 1 capsule (50 mg total) by mouth daily. 30 capsule 0   LORazepam (ATIVAN) 1 MG tablet Take 1 tablet (1 mg total) by mouth daily as needed. 30 tablet 1   meclizine (ANTIVERT) 25 MG tablet Take 1 tablet (25 mg total) by mouth 3 (three) times daily as needed for dizziness. 30 tablet 0   Multiple Vitamin (MULTIVITAMIN WITH MINERALS) TABS tablet Take 1 tablet by mouth daily.     ondansetron (ZOFRAN-ODT) 4 MG disintegrating tablet Take 1 tablet (4 mg total) by mouth every 8 (eight) hours as needed for nausea or vomiting. 90 tablet 1   pantoprazole (PROTONIX) 40 MG tablet Take 40 mg by mouth daily.     Semaglutide,0.25 or 0.5MG /DOS, (OZEMPIC, 0.25 OR 0.5 MG/DOSE,) 2 MG/3ML SOPN Inject 0.25 mg into the skin once a week. 3 mL 0   venlafaxine XR (EFFEXOR-XR) 75 MG 24 hr capsule Take 1 capsule (75 mg total) by mouth daily with breakfast. 30 capsule 2   Vitamin D, Ergocalciferol, (DRISDOL) 1.25 MG (50000 UNIT) CAPS capsule Take 1 capsule (50,000 Units total) by mouth 2 (two) times a week. 8 capsule 2   No facility-administered medications prior to visit.    Allergies  Allergen Reactions   Oxy Ir [Oxycodone] Itching   Dilaudid [Hydromorphone] Rash     Social History   Tobacco Use   Smoking status: Never   Smokeless tobacco: Never  Vaping Use   Vaping status: Never Used  Substance Use Topics   Alcohol use: Yes    Comment: occasionally   Drug use: Never     Review of Systems  Constitutional:   Negative for chills, fever and malaise/fatigue.  HENT:  Negative for congestion, ear pain and sore throat.   Respiratory:  Negative for cough and shortness of breath.   Cardiovascular:  Negative for chest pain.  Neurological:  Positive for headaches.     Labs/Other Tests and Data Reviewed:    Recent Labs: 12/07/2022: ALT 23; TSH 1.080 12/23/2022: Platelets 312 12/27/2022: BUN 16; Creatinine, Ser 0.60; Hemoglobin 11.9; Potassium 3.9; Sodium 139   Recent Lipid Panel Lab Results  Component Value Date/Time   CHOL 184 08/17/2022 08:42 AM   TRIG 58 08/17/2022 08:42 AM   HDL 74 08/17/2022 08:42 AM   CHOLHDL 2.5 08/17/2022 08:42 AM   LDLCALC 99 08/17/2022 08:42 AM    Wt Readings from Last 3 Encounters:  02/16/23 179 lb (81.2 kg)  12/27/22 178 lb 3.2 oz (80.8 kg)  12/23/22 177 lb 6.4 oz (80.5 kg)     Objective:    Vital Signs:  LMP 12/04/2022 (Exact Date)    Physical Exam   ASSESSMENT & PLAN:   Influenza A Assessment & Plan:  Discussed the benefits of antiviral therapy (shortens course) and the need for symptomatic treatment for the headache. - sent Xofluza -Continue Tylenol for headache.    Influenza B Assessment & Plan:  Discussed the benefits of antiviral therapy (shortens course) and the need for symptomatic treatment for the headache. - sent Xofluza -Continue Tylenol for headache.       No orders of the defined types were placed in this encounter.  No orders of the defined types were placed in this encounter.   I spent 8 minutes dedicated to the care of this patient on the date of this encounter to include face-to-face time with the patient.  Follow Up:  In Person prn  Signed, Blane Ohara, MD  03/22/2023 2:10 PM    Jaesean Litzau [facility]

## 2023-03-22 NOTE — Progress Notes (Deleted)
Virtual Visit via Video Note   This visit type was conducted per patient request This format is felt to be appropriate for this patient at this time.  All issues noted in this document were discussed and addressed.  A limited physical exam was performed with this format.  A verbal consent was obtained for the virtual visit.   Date:  03/22/2023   ID:  Michelle Lamb, DOB 24-Dec-1990, MRN [id number]  Patient Location: Home Provider Location: Home Office  PCP:  Marianne Sofia, PA-C   Chief Complaint:  flu  Discussed the use of AI scribe software for clinical note transcription with the patient, who gave verbal consent to proceed.  History of Present Illness             History of Present Illness:    Michelle Lamb is a 33 y.o. female with ***  No chief complaint on file. .  The patient {does/does not:200015} have symptoms concerning for COVID-19 infection (fever, chills, cough, or new shortness of breath).    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   Blood transfusion without reported diagnosis 2018   postpartum   Chest pain of uncertain etiology 06/08/2022   06/15/22 Myocardial perfusion - normal, 09/15/22 long term heart monitor in Epic   Chronic kidney disease    right kidney at 35%   Complication of anesthesia    Slow to wake up   Constipation 06/28/2022   Diabetes mellitus without complication (HCC)    NOT AN ISSUE SINCE GASTRIC BYPASS IN 2022. TYPE 2 LAST DOSE FRIDAY December 24, 2020 (METFORMIN)   Dizziness 08/22/2022   Dizziness 2024   Follows w/ PCP and cardiology.   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  Report date is 02/23/2022.      The CancerNext-Expanded gene panel offered by [facility] and includes sequencing, rearrangement, and RNA analysis for the following 77 genes: AIP, ALK, APC, ATM, AXIN2, BAP1, BARD1, BLM, BMPR1A, BRCA1, BRCA2, BRIP1, CDC73, CDH1, CDK4, CDKN1B, CDKN2A, CHEK2, CTNNA1, DICER1, FANCC, FH, FLCN, GALNT12, KIF1B, LZTR1,   Heart murmur    as a baby   Hepatic steatosis 09/08/2021   History of herpes simplex infection    History of partial nephrectomy 03/21/2022   Formatting of this note might be different from the original. 03/15/2021: Underwent right partial nephrectomy by Dr. Laverle Patter for right renal neoplasm. Pathology: Angiomyolipoma. Formatting of this note might be different from the original. 03/15/2021: Underwent right partial nephrectomy by Dr. Laverle Patter for right renal neoplasm. Pathology: Angiomyolipoma.   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   Morbid obesity (HCC) 06/02/2020   Neoplasm of right kidney    benign  tumor of kidney   Obesity (BMI 30.0-34.9) 06/08/2022   Obstructive sleep apnea 10/29/2019   hx of before gastric bypass, no longer uses CPAP   Racing heart beat 08/22/2022   see 09/15/22 Long term heart monitor in Epic   Seasonal allergies    Ureteral obstruction, right 07/15/2021   Vaginal delivery 2009, 2010, 2015   Vitamin D insufficiency 09/30/2020   Weight loss 09/30/2020    Past Surgical History:  Procedure Laterality Date   CESAREAN SECTION  09/29/2016   CHOLECYSTECTOMY      COLONOSCOPY WITH ESOPHAGOGASTRODUODENOSCOPY (EGD)  07/22/2022   CYSTOSCOPY N/A 12/27/2022   Procedure: CYSTOSCOPY;  Surgeon: Earley Favor, MD;  Location: [facility];  Service: Gynecology;  Laterality: N/A;   CYSTOSCOPY W/ URETERAL STENT PLACEMENT Right 07/15/2021   Procedure: CYSTOSCOPY WITH RETROGRADE PYELOGRAM/URETERAL STENT PLACEMENT;  Surgeon: Heloise Purpura, MD;  Location: [facility];  Service: Urology;  Laterality: Right;   CYSTOSCOPY WITH RETROGRADE PYELOGRAM, URETEROSCOPY AND STENT PLACEMENT Right 05/03/2021   Procedure: CYSTOSCOPY WITH RIGHT RETROGRADE PYELOGRAM, URETEROSCOPY;  Surgeon: Heloise Purpura, MD;  Location: [facility];  Service: Urology;  Laterality: Right;   DILATION AND CURETTAGE OF UTERUS N/A 12/31/2015   Procedure: SUCTION DILATATION AND CURETTAGE;  Surgeon: Rock[place] Bing, MD;  Location: [facility];  Service: Gynecology;  Laterality: N/A;   DILATION AND EVACUATION N/A 01/01/2016   Procedure: DILATATION AND EVACUATION;  Surgeon: Tilda Burrow, MD;  Location: [facility];  Service: Gynecology;  Laterality: N/A;   ESOPHAGOGASTRODUODENOSCOPY     GASTRIC BYPASS  06/02/2020   IR NEPHRO TUBE REMOV/FL  07/16/2021   IR NEPHROSTOMY EXCHANGE RIGHT  05/12/2021   IR NEPHROSTOMY PLACEMENT RIGHT  05/05/2021   ROBOT ASSISTED PYELOPLASTY Right 07/15/2021   Procedure: XI ROBOTIC ASSISTED  LAPAROSCOPIC PYELOPLASTY;  Surgeon: Heloise Purpura, MD;  Location: [facility];  Service: Urology;  Laterality: Right;   ROBOTIC ASSISTED LAPAROSCOPIC HYSTERECTOMY AND SALPINGECTOMY Bilateral 12/27/2022   Procedure: XI ROBOTIC ASSISTED LAPAROSCOPIC HYSTERECTOMY AND SALPINGECTOMY;  Surgeon: Earley Favor, MD;  Location[facility];  Service: Gynecology;  Laterality: Bilateral;   ROBOTIC ASSITED PARTIAL NEPHRECTOMY Right 03/15/2021   Procedure: XI ROBOTIC ASSITED PARTIAL NEPHRECTOMY;  Surgeon: Heloise Purpura, MD;  Location: [facility];  Service: Urology;  Laterality: Right;    TONSILLECTOMY     TUBAL LIGATION     UPPER GASTROINTESTINAL ENDOSCOPY     w/dilation    Family History  Problem Relation Age of Onset   Hypertension Mother    Thyroid disease Mother    Cervical cancer Mother        dx 29s   Stomach cancer Maternal Grandmother        dx 32s   Leukemia Maternal Grandfather        d. 83s   Depression Paternal Grandmother    Cancer Paternal Grandfather        dx after 54; mets   Breast cancer Maternal Great-grandmother        dx <50; mets; MGF's mother   Stomach cancer Other        MGM's brother; dx after 62   Cancer Other        MGF's sisters x3; unknown primary   Colon cancer Neg Hx    Colon polyps Neg Hx    Esophageal cancer Neg Hx    Rectal cancer Neg Hx     Social History   Socioeconomic History   Marital status: Married    Spouse name: Not  on file   Number of children: 9   Years of education: 13   Highest education level: Associate degree: occupational, Scientist, product/process development, or vocational program  Occupational History   Occupation: Scientist, forensic  Tobacco Use   Smoking status: Never   Smokeless tobacco: Never  Vaping Use   Vaping status: Never Used  Substance and Sexual Activity   Alcohol use: Yes    Comment: occasionally   Drug use: Never   Sexual activity: Not Currently    Partners: Male    Birth control/protection: Surgical    Comment: tubal, hysterectomy  Other Topics Concern   Not on file  Social History Narrative   Not on file   Social Drivers of Health   Financial Resource Strain: Low Risk  (08/21/2022)   Overall Financial Resource Strain (CARDIA)    Difficulty of Paying Living Expenses: Not hard at all  Food Insecurity: No Food Insecurity (08/21/2022)   Hunger Vital Sign    Worried About Running Out of Food in the Last Year: Never true    Ran Out of Food in the Last Year: Never true  Transportation Needs: No Transportation Needs (08/21/2022)   PRAPARE - Administrator, Civil Service (Medical):  No    Lack of Transportation (Non-Medical): No  Physical Activity: Insufficiently Active (08/21/2022)   Exercise Vital Sign    Days of Exercise per Week: 3 days    Minutes of Exercise per Session: 40 min  Stress: Stress Concern Present (08/21/2022)   [employer] of Occupational Health - Occupational Stress Questionnaire    Feeling of Stress : To some extent  Social Connections: Socially Integrated (08/21/2022)   Social Connection and Isolation Panel [NHANES]    Frequency of Communication with Friends and Family: More than three times a week    Frequency of Social Gatherings with Friends and Family: More than three times a week    Attends Religious Services: 1 to 4 times per year    Active Member of [employer] or Organizations: Yes    Attends Engineer, structural: More than 4 times per year    Marital Status: Married  Catering manager Violence: Not on file    Outpatient Medications Prior to Visit  Medication Sig Dispense Refill   acetaminophen (TYLENOL) 500 MG tablet Take 1 tablet (500 mg total) by mouth every 6 (six) hours as needed. 30 tablet 0   albuterol (VENTOLIN HFA) 108 (90 Base) MCG/ACT inhaler Inhale 2 puffs into the lungs every 6 (six) hours as needed for wheezing or shortness of breath. 1 each 5   cyanocobalamin (VITAMIN B12) 1000 MCG/ML injection Inject 1 ML as directed once a month 1 mL 2   estradiol (ESTRACE VAGINAL) 0.1 MG/GM vaginal cream Rub pea size amount each night for 3 weeks then 3 times a week thereafter. (Patient not taking: Reported on 03/09/2023) 42.5 g 0   fluticasone (FLONASE) 50 MCG/ACT nasal spray Place 2 sprays into both nostrils daily. (Patient taking differently: Place 2 sprays into both nostrils as needed for allergies or rhinitis.) 16 g 6   linaclotide (LINZESS) 145 MCG CAPS capsule Take 145 mcg by mouth daily before breakfast.     lisdexamfetamine (VYVANSE) 50 MG capsule Take 1 capsule (50 mg total) by mouth daily. 30 capsule 0   LORazepam (ATIVAN)  1 MG tablet Take 1 tablet (1 mg total) by mouth daily as needed. 30 tablet 1   meclizine (ANTIVERT) 25 MG tablet Take 1 tablet (25 mg total)  by mouth 3 (three) times daily as needed for dizziness. 30 tablet 0   Multiple Vitamin (MULTIVITAMIN WITH MINERALS) TABS tablet Take 1 tablet by mouth daily.     ondansetron (ZOFRAN-ODT) 4 MG disintegrating tablet Take 1 tablet (4 mg total) by mouth every 8 (eight) hours as needed for nausea or vomiting. 90 tablet 1   pantoprazole (PROTONIX) 40 MG tablet Take 40 mg by mouth daily.     Semaglutide,0.25 or 0.5MG /DOS, (OZEMPIC, 0.25 OR 0.5 MG/DOSE,) 2 MG/3ML SOPN Inject 0.25 mg into the skin once a week. 3 mL 0   venlafaxine XR (EFFEXOR-XR) 75 MG 24 hr capsule Take 1 capsule (75 mg total) by mouth daily with breakfast. 30 capsule 2   Vitamin D, Ergocalciferol, (DRISDOL) 1.25 MG (50000 UNIT) CAPS capsule Take 1 capsule (50,000 Units total) by mouth 2 (two) times a week. 8 capsule 2   No facility-administered medications prior to visit.    Allergies  Allergen Reactions   Oxy Ir [Oxycodone] Itching   Dilaudid [Hydromorphone] Rash     Social History   Tobacco Use   Smoking status: Never   Smokeless tobacco: Never  Vaping Use   Vaping status: Never Used  Substance Use Topics   Alcohol use: Yes    Comment: occasionally   Drug use: Never     ROS   Labs/Other Tests and Data Reviewed:    Recent Labs: 12/07/2022: ALT 23; TSH 1.080 12/23/2022: Platelets 312 12/27/2022: BUN 16; Creatinine, Ser 0.60; Hemoglobin 11.9; Potassium 3.9; Sodium 139   Recent Lipid Panel Lab Results  Component Value Date/Time   CHOL 184 08/17/2022 08:42 AM   TRIG 58 08/17/2022 08:42 AM   HDL 74 08/17/2022 08:42 AM   CHOLHDL 2.5 08/17/2022 08:42 AM   LDLCALC 99 08/17/2022 08:42 AM    Wt Readings from Last 3 Encounters:  02/16/23 179 lb (81.2 kg)  12/27/22 178 lb 3.2 oz (80.8 kg)  12/23/22 177 lb 6.4 oz (80.5 kg)     Objective:    Vital Signs:  LMP 12/04/2022  (Exact Date)    Physical Exam   ASSESSMENT & PLAN:   There are no diagnoses linked to this encounter.   No orders of the defined types were placed in this encounter.    Meds ordered this encounter  Medications   Baloxavir Marboxil,80 MG Dose, (XOFLUZA, 80 MG DOSE,) 1 x 80 MG TBPK    Sig: Take 80 mg by mouth once for 1 dose.    Dispense:  1 each    Refill:  0    I spent < time > minutes dedicated to the care of this patient on the date of this encounter to include face-to-face time with the patient.  Follow Up:  {F/U Format:[id number]} {follow up:15908}  Signed, Blane Ohara, MD  03/22/2023 2:03 PM    [facility] [place]

## 2023-03-23 ENCOUNTER — Other Ambulatory Visit: Payer: Self-pay

## 2023-03-23 ENCOUNTER — Other Ambulatory Visit (HOSPITAL_BASED_OUTPATIENT_CLINIC_OR_DEPARTMENT_OTHER): Payer: Self-pay

## 2023-03-23 ENCOUNTER — Encounter: Payer: Self-pay | Admitting: Family Medicine

## 2023-03-23 DIAGNOSIS — J060 Acute laryngopharyngitis: Secondary | ICD-10-CM

## 2023-03-23 MED ORDER — PROMETHAZINE-DM 6.25-15 MG/5ML PO SYRP
5.0000 mL | ORAL_SOLUTION | Freq: Four times a day (QID) | ORAL | 0 refills | Status: DC | PRN
  Filled 2023-03-23: qty 118, 6d supply, fill #0

## 2023-03-23 MED ORDER — OSELTAMIVIR PHOSPHATE 75 MG PO CAPS: 75.0000 mg | ORAL_CAPSULE | Freq: Two times a day (BID) | ORAL | 0 refills | Status: DC

## 2023-03-27 ENCOUNTER — Other Ambulatory Visit (HOSPITAL_BASED_OUTPATIENT_CLINIC_OR_DEPARTMENT_OTHER): Payer: Self-pay

## 2023-03-27 ENCOUNTER — Other Ambulatory Visit: Payer: Self-pay | Admitting: Physician Assistant

## 2023-03-27 DIAGNOSIS — R112 Nausea with vomiting, unspecified: Secondary | ICD-10-CM

## 2023-03-27 MED ORDER — ONDANSETRON 4 MG PO TBDP
4.0000 mg | ORAL_TABLET | Freq: Three times a day (TID) | ORAL | 0 refills | Status: DC | PRN
  Filled 2023-03-27: qty 90, 30d supply, fill #0

## 2023-03-28 ENCOUNTER — Encounter: Payer: Self-pay | Admitting: Physician Assistant

## 2023-03-28 ENCOUNTER — Ambulatory Visit (INDEPENDENT_AMBULATORY_CARE_PROVIDER_SITE_OTHER): Payer: 59 | Admitting: Physician Assistant

## 2023-03-28 VITALS — BP 128/78 | HR 77 | Temp 97.3°F | Ht 63.0 in | Wt 179.8 lb

## 2023-03-28 DIAGNOSIS — G8929 Other chronic pain: Secondary | ICD-10-CM

## 2023-03-28 DIAGNOSIS — D508 Other iron deficiency anemias: Secondary | ICD-10-CM | POA: Diagnosis not present

## 2023-03-28 DIAGNOSIS — R5383 Other fatigue: Secondary | ICD-10-CM | POA: Diagnosis not present

## 2023-03-28 DIAGNOSIS — R197 Diarrhea, unspecified: Secondary | ICD-10-CM | POA: Diagnosis not present

## 2023-03-28 DIAGNOSIS — E559 Vitamin D deficiency, unspecified: Secondary | ICD-10-CM

## 2023-03-28 DIAGNOSIS — R42 Dizziness and giddiness: Secondary | ICD-10-CM

## 2023-03-28 DIAGNOSIS — D513 Other dietary vitamin B12 deficiency anemia: Secondary | ICD-10-CM | POA: Diagnosis not present

## 2023-03-28 DIAGNOSIS — M545 Low back pain, unspecified: Secondary | ICD-10-CM | POA: Diagnosis not present

## 2023-03-28 NOTE — Addendum Note (Signed)
Addended by: Marianne Sofia on: 03/28/2023 03:21 PM   Modules accepted: Orders

## 2023-03-28 NOTE — Progress Notes (Addendum)
Acute Office Visit  Subjective:    Patient ID: Michelle Lamb, female    DOB: 1990-07-18, 33 y.o.   MRN: [id number]  Chief Complaint  Patient presents with   Dizzy   Dizziness    HPI: Patient is in today for complaints of malaise and dizziness.  She states she has had intermittent symptoms since Saturday.  Says the dizziness comes and goes and lasts no more than 15 seconds.  She was diagnosed with influenza on Thursday (did not take medication - treated symptoms) She is having moderate diarrhea and loose bowels almost every time she uses bathroom She did have problems with moderate diarrhea a few weeks ago as well and has only had a few days with normal bms Denies melena or hematochezia Pt does have history of vit B12 def , vit D def, iron deficiency - due for repeat labwork  Pt also mentions she has chronic intermittent back pain over the past year.  Remembers her 'back going out' over a year ago and happens sporadically since that time.  Happens few times monthly.  Feels like she can't get comfortable.  Has muscle relaxers which don't help.  Has tried tylenol which does not help.  No numbness or tingling or weakness of extremities  Current Outpatient Medications:    albuterol (VENTOLIN HFA) 108 (90 Base) MCG/ACT inhaler, Inhale 2 puffs into the lungs every 6 (six) hours as needed for wheezing or shortness of breath., Disp: 1 each, Rfl: 5   cyanocobalamin (VITAMIN B12) 1000 MCG/ML injection, Inject 1 ML as directed once a month, Disp: 1 mL, Rfl: 2   fluticasone (FLONASE) 50 MCG/ACT nasal spray, Place 2 sprays into both nostrils daily. (Patient taking differently: Place 2 sprays into both nostrils as needed for allergies or rhinitis.), Disp: 16 g, Rfl: 6   linaclotide (LINZESS) 145 MCG CAPS capsule, Take 145 mcg by mouth daily before breakfast., Disp: , Rfl:    lisdexamfetamine (VYVANSE) 50 MG capsule, Take 1 capsule (50 mg total) by mouth daily., Disp: 30 capsule, Rfl: 0    LORazepam (ATIVAN) 1 MG tablet, Take 1 tablet (1 mg total) by mouth daily as needed., Disp: 30 tablet, Rfl: 1   meclizine (ANTIVERT) 25 MG tablet, Take 1 tablet (25 mg total) by mouth 3 (three) times daily as needed for dizziness., Disp: 30 tablet, Rfl: 0   Multiple Vitamin (MULTIVITAMIN WITH MINERALS) TABS tablet, Take 1 tablet by mouth daily., Disp: , Rfl:    ondansetron (ZOFRAN-ODT) 4 MG disintegrating tablet, Take 1 tablet (4 mg total) by mouth every 8 (eight) hours as needed for nausea or vomiting., Disp: 90 tablet, Rfl: 0   pantoprazole (PROTONIX) 40 MG tablet, Take 40 mg by mouth daily., Disp: , Rfl:    Semaglutide,0.25 or 0.5MG /DOS, (OZEMPIC, 0.25 OR 0.5 MG/DOSE,) 2 MG/3ML SOPN, Inject 0.25 mg into the skin once a week., Disp: 3 mL, Rfl: 0   venlafaxine XR (EFFEXOR-XR) 75 MG 24 hr capsule, Take 1 capsule (75 mg total) by mouth daily with breakfast., Disp: 30 capsule, Rfl: 2   Vitamin D, Ergocalciferol, (DRISDOL) 1.25 MG (50000 UNIT) CAPS capsule, Take 1 capsule (50,000 Units total) by mouth 2 (two) times a week., Disp: 8 capsule, Rfl: 2  Allergies  Allergen Reactions   Oxy Ir [Oxycodone] Itching   Dilaudid [Hydromorphone] Rash    ROS CONSTITUTIONAL: see HPI E/N/T: Negative for ear pain, nasal congestion and sore throat.  CARDIOVASCULAR: Negative for chest pain,  RESPIRATORY: Negative for recent cough  and dyspnea.  GASTROINTESTINAL: see HPI Musc- see ROS INTEGUMENTARY: Negative for rash.  NEUROLOGICAL: Negative for dizziness and headaches.  PSYCHIATRIC: Negative for sleep disturbance and to question depression screen.  Negative for depression, negative for anhedonia.      Objective:    PHYSICAL EXAM:   BP 128/78 (BP Location: Left Arm, Patient Position: Sitting)   Pulse 77   Temp (!) 97.3 F (36.3 C) (Temporal)   Ht 5\' 3"  (1.6 m)   Wt 179 lb 12.8 oz (81.6 kg)   LMP 12/04/2022 (Exact Date)   SpO2 99%   BMI 31.85 kg/m    GEN: Well nourished, well developed, in no acute  distress  HEENT: normal external ears and nose - normal external auditory canals and TMS -  - Lips, Teeth and Gums - normal  Oropharynx - normal mucosa, palate, and posterior pharynx Cardiac: RRR; no murmurs, rubs, or gallops,no edema -  Respiratory:  normal respiratory rate and pattern with no distress - normal breath sounds with no rales, rhonchi, wheezes or rubs GI: normal bowel sounds, no masses or tenderness MS: no deformity or atrophy  Skin: warm and dry, no rash      Assessment & Plan:    Dizziness -     CBC with Differential/Platelet -     Comprehensive metabolic panel -     TSH -     Magnesium  Other fatigue -     CBC with Differential/Platelet -     Comprehensive metabolic panel -     TSH -     Magnesium  Vitamin D deficiency -     VITAMIN D 25 Hydroxy (Vit-D Deficiency, Fractures)  Other dietary vitamin B12 deficiency anemia -     B12 and Folate Panel  Other iron deficiency anemia -     Iron, TIBC and Ferritin Panel  Diarrhea, unspecified type -     Cdiff NAA+O+P+Stool Culture  Chronic bilateral low back pain without sciatica     Follow-up: Return if symptoms worsen or fail to improve.  An After Visit Summary was printed and given to the patient.  Jettie Pagan [facility] [phone]

## 2023-03-29 ENCOUNTER — Other Ambulatory Visit: Payer: Self-pay | Admitting: Physician Assistant

## 2023-03-29 ENCOUNTER — Other Ambulatory Visit (HOSPITAL_BASED_OUTPATIENT_CLINIC_OR_DEPARTMENT_OTHER): Payer: Self-pay

## 2023-03-29 DIAGNOSIS — R197 Diarrhea, unspecified: Secondary | ICD-10-CM | POA: Diagnosis not present

## 2023-03-29 LAB — IRON,TIBC AND FERRITIN PANEL
Ferritin: 18 ng/mL (ref 15–150)
Iron Saturation: 12 % — ABNORMAL LOW (ref 15–55)
Iron: 47 ug/dL (ref 27–159)
Total Iron Binding Capacity: 398 ug/dL (ref 250–450)
UIBC: 351 ug/dL (ref 131–425)

## 2023-03-29 LAB — B12 AND FOLATE PANEL
Folate: 6.7 ng/mL (ref >3.0)
Vitamin B-12: 407 pg/mL (ref 232–1245)

## 2023-03-29 LAB — CBC WITH DIFFERENTIAL/PLATELET
Basophils Absolute: 0.1 10*3/uL (ref 0.0–0.2)
Basos: 1 %
EOS (ABSOLUTE): 0.5 10*3/uL — ABNORMAL HIGH (ref 0.0–0.4)
Eos: 5 %
Hematocrit: 39 % (ref 34.0–46.6)
Hemoglobin: 12.1 g/dL (ref 11.1–15.9)
Immature Grans (Abs): 0 10*3/uL (ref 0.0–0.1)
Immature Granulocytes: 0 %
Lymphocytes Absolute: 2.9 10*3/uL (ref 0.7–3.1)
Lymphs: 28 %
MCH: 26.1 pg — ABNORMAL LOW (ref 26.6–33.0)
MCHC: 31 g/dL — ABNORMAL LOW (ref 31.5–35.7)
MCV: 84 fL (ref 79–97)
Monocytes Absolute: 0.6 10*3/uL (ref 0.1–0.9)
Monocytes: 6 %
Neutrophils Absolute: 6.4 10*3/uL (ref 1.4–7.0)
Neutrophils: 60 %
Platelets: 355 10*3/uL (ref 150–450)
RBC: 4.63 x10E6/uL (ref 3.77–5.28)
RDW: 15.4 % (ref 11.7–15.4)
WBC: 10.5 10*3/uL (ref 3.4–10.8)

## 2023-03-29 LAB — COMPREHENSIVE METABOLIC PANEL
ALT: 58 [IU]/L — ABNORMAL HIGH (ref 0–32)
AST: 40 [IU]/L (ref 0–40)
Albumin: 4.6 g/dL (ref 3.9–4.9)
Alkaline Phosphatase: 99 [IU]/L (ref 44–121)
BUN/Creatinine Ratio: 25 — ABNORMAL HIGH (ref 9–23)
BUN: 15 mg/dL (ref 6–20)
Bilirubin Total: 0.6 mg/dL (ref 0.0–1.2)
CO2: 21 mmol/L (ref 20–29)
Calcium: 9.3 mg/dL (ref 8.7–10.2)
Chloride: 103 mmol/L (ref 96–106)
Creatinine, Ser: 0.6 mg/dL (ref 0.57–1.00)
Globulin, Total: 2.2 g/dL (ref 1.5–4.5)
Glucose: 80 mg/dL (ref 70–99)
Potassium: 4.3 mmol/L (ref 3.5–5.2)
Sodium: 139 mmol/L (ref 134–144)
Total Protein: 6.8 g/dL (ref 6.0–8.5)
eGFR: 121 mL/min/{1.73_m2} (ref >59)

## 2023-03-29 LAB — TSH: TSH: 1.14 u[IU]/mL (ref 0.450–4.500)

## 2023-03-29 LAB — VITAMIN D 25 HYDROXY (VIT D DEFICIENCY, FRACTURES): Vit D, 25-Hydroxy: 25.6 ng/mL — ABNORMAL LOW (ref 30.0–100.0)

## 2023-03-29 LAB — MAGNESIUM: Magnesium: 1.7 mg/dL (ref 1.6–2.3)

## 2023-03-29 MED ORDER — VITAMIN D (ERGOCALCIFEROL) 1.25 MG (50000 UNIT) PO CAPS
50000.0000 [IU] | ORAL_CAPSULE | ORAL | 2 refills | Status: DC
  Filled 2023-03-29: qty 8, 28d supply, fill #0
  Filled 2023-04-27: qty 8, 28d supply, fill #1

## 2023-03-29 MED ORDER — HYDROCODONE-ACETAMINOPHEN 10-325 MG PO TABS
1.0000 | ORAL_TABLET | Freq: Three times a day (TID) | ORAL | 0 refills | Status: DC | PRN
  Filled 2023-03-29: qty 15, 5d supply, fill #0

## 2023-03-30 ENCOUNTER — Other Ambulatory Visit: Payer: Self-pay | Admitting: Physician Assistant

## 2023-03-30 ENCOUNTER — Other Ambulatory Visit (HOSPITAL_BASED_OUTPATIENT_CLINIC_OR_DEPARTMENT_OTHER): Payer: Self-pay

## 2023-03-30 DIAGNOSIS — D513 Other dietary vitamin B12 deficiency anemia: Secondary | ICD-10-CM

## 2023-03-30 DIAGNOSIS — F418 Other specified anxiety disorders: Secondary | ICD-10-CM

## 2023-03-30 MED ORDER — PANTOPRAZOLE SODIUM 40 MG PO TBEC
40.0000 mg | DELAYED_RELEASE_TABLET | Freq: Every day | ORAL | 11 refills | Status: AC
  Filled 2023-03-30: qty 30, 30d supply, fill #0
  Filled 2023-04-27: qty 30, 30d supply, fill #1
  Filled 2023-05-29: qty 30, 30d supply, fill #2
  Filled 2023-07-02: qty 30, 30d supply, fill #3
  Filled 2023-07-28: qty 30, 30d supply, fill #4
  Filled 2023-08-28: qty 30, 30d supply, fill #5
  Filled 2023-11-10: qty 30, 30d supply, fill #6
  Filled 2023-12-22: qty 30, 30d supply, fill #7
  Filled 2024-03-08: qty 30, 30d supply, fill #8

## 2023-03-30 MED ORDER — LISDEXAMFETAMINE DIMESYLATE 50 MG PO CAPS: 50.0000 mg | ORAL_CAPSULE | Freq: Every day | ORAL | 0 refills | Status: DC

## 2023-03-30 MED ORDER — LISDEXAMFETAMINE DIMESYLATE 50 MG PO CAPS
50.0000 mg | ORAL_CAPSULE | Freq: Every day | ORAL | 0 refills | Status: DC
  Filled 2023-03-30: qty 30, 30d supply, fill #0

## 2023-03-30 MED ORDER — VENLAFAXINE HCL ER 75 MG PO CP24
75.0000 mg | ORAL_CAPSULE | Freq: Every day | ORAL | 2 refills | Status: DC
  Filled 2023-03-30: qty 30, 30d supply, fill #0
  Filled 2023-04-27: qty 30, 30d supply, fill #1

## 2023-03-30 MED ORDER — LORAZEPAM 1 MG PO TABS
1.0000 mg | ORAL_TABLET | Freq: Every day | ORAL | 1 refills | Status: DC | PRN
  Filled 2023-03-30: qty 30, 30d supply, fill #0

## 2023-03-30 MED ORDER — CYANOCOBALAMIN 1000 MCG/ML IJ SOLN
INTRAMUSCULAR | 2 refills | Status: DC
  Filled 2023-03-30: qty 1, 30d supply, fill #0
  Filled 2023-04-27: qty 1, 30d supply, fill #1
  Filled 2023-05-29: qty 1, 30d supply, fill #2

## 2023-03-31 ENCOUNTER — Other Ambulatory Visit (HOSPITAL_BASED_OUTPATIENT_CLINIC_OR_DEPARTMENT_OTHER): Payer: Self-pay

## 2023-04-04 DIAGNOSIS — M546 Pain in thoracic spine: Secondary | ICD-10-CM | POA: Insufficient documentation

## 2023-04-04 DIAGNOSIS — M545 Low back pain, unspecified: Secondary | ICD-10-CM | POA: Diagnosis not present

## 2023-04-04 LAB — CDIFF NAA+O+P+STOOL CULTURE
E coli, Shiga toxin Assay: NEGATIVE
Toxigenic C. Difficile by PCR: NEGATIVE

## 2023-04-06 ENCOUNTER — Other Ambulatory Visit (HOSPITAL_BASED_OUTPATIENT_CLINIC_OR_DEPARTMENT_OTHER): Payer: Self-pay

## 2023-04-07 ENCOUNTER — Other Ambulatory Visit (HOSPITAL_BASED_OUTPATIENT_CLINIC_OR_DEPARTMENT_OTHER): Payer: Self-pay

## 2023-04-07 ENCOUNTER — Encounter: Payer: Self-pay | Admitting: Physician Assistant

## 2023-04-07 ENCOUNTER — Ambulatory Visit (INDEPENDENT_AMBULATORY_CARE_PROVIDER_SITE_OTHER): Payer: 59 | Admitting: Physician Assistant

## 2023-04-07 VITALS — BP 136/84 | HR 78 | Temp 97.2°F | Ht 63.0 in | Wt 183.0 lb

## 2023-04-07 DIAGNOSIS — M5441 Lumbago with sciatica, right side: Secondary | ICD-10-CM

## 2023-04-07 MED ORDER — TRIAMCINOLONE ACETONIDE 40 MG/ML IJ SUSP
80.0000 mg | Freq: Once | INTRAMUSCULAR | Status: AC
  Administered 2023-04-07: 80 mg via INTRAMUSCULAR

## 2023-04-07 MED ORDER — PREDNISONE 20 MG PO TABS
ORAL_TABLET | ORAL | 0 refills | Status: AC
  Filled 2023-04-07: qty 18, 9d supply, fill #0

## 2023-04-07 NOTE — Progress Notes (Signed)
 Acute Office Visit  Subjective:    Patient ID: Michelle Lamb, female    DOB: 10-Feb-1991, 33 y.o.   MRN: [id number]  Chief Complaint  Patient presents with   Back Pain    HPI: Patient is in today for complaints of low back pain.  She was recently evaluated for intermittent chronic low back pain and just started going to physical therapy.  Back has been mildly sore this week but today she was helping a patient get up on table and he pressed his weight onto her shoulder and then helped him pull up after laying down on table.  Pt felt a pop in her lower middle back area.  When she helped him sit up she felt pain down her right buttock and right leg.  Has put heat on lower back which helps but with walking has pain in right buttock Pt has muscle relaxants at home and does have pain medication to use prn  Current Outpatient Medications:    predniSONE  (DELTASONE ) 20 MG tablet, Take 1 tablet (20 mg total) by mouth 3 (three) times daily for 3 days, THEN 1 tablet (20 mg total) 2 (two) times daily for 3 days, THEN 1 tablet (20 mg total) daily for 3 days., Disp: 18 tablet, Rfl: 0   albuterol  (VENTOLIN  HFA) 108 (90 Base) MCG/ACT inhaler, Inhale 2 puffs into the lungs every 6 (six) hours as needed for wheezing or shortness of breath., Disp: 1 each, Rfl: 5   cyanocobalamin  (VITAMIN B12) 1000 MCG/ML injection, Inject 1 ML as directed once a month, Disp: 1 mL, Rfl: 2   fluticasone  (FLONASE ) 50 MCG/ACT nasal spray, Place 2 sprays into both nostrils daily. (Patient taking differently: Place 2 sprays into both nostrils as needed for allergies or rhinitis.), Disp: 16 g, Rfl: 6   linaclotide  (LINZESS ) 145 MCG CAPS capsule, Take 145 mcg by mouth daily before breakfast., Disp: , Rfl:    lisdexamfetamine (VYVANSE ) 50 MG capsule, Take 1 capsule (50 mg total) by mouth daily., Disp: 30 capsule, Rfl: 0   LORazepam  (ATIVAN ) 1 MG tablet, Take 1 tablet (1 mg total) by mouth daily as needed., Disp: 30 tablet, Rfl:  1   meclizine  (ANTIVERT ) 25 MG tablet, Take 1 tablet (25 mg total) by mouth 3 (three) times daily as needed for dizziness., Disp: 30 tablet, Rfl: 0   Multiple Vitamin (MULTIVITAMIN WITH MINERALS) TABS tablet, Take 1 tablet by mouth daily., Disp: , Rfl:    ondansetron  (ZOFRAN -ODT) 4 MG disintegrating tablet, Take 1 tablet (4 mg total) by mouth every 8 (eight) hours as needed for nausea or vomiting., Disp: 90 tablet, Rfl: 0   pantoprazole  (PROTONIX ) 40 MG tablet, Take 1 tablet (40 mg total) by mouth daily., Disp: 30 tablet, Rfl: 11   Semaglutide ,0.25 or 0.5MG /DOS, (OZEMPIC , 0.25 OR 0.5 MG/DOSE,) 2 MG/3ML SOPN, Inject 0.25 mg into the skin once a week., Disp: 3 mL, Rfl: 0   venlafaxine  XR (EFFEXOR -XR) 75 MG 24 hr capsule, Take 1 capsule (75 mg total) by mouth daily with breakfast., Disp: 30 capsule, Rfl: 2   Vitamin D , Ergocalciferol , (DRISDOL ) 1.25 MG (50000 UNIT) CAPS capsule, Take 1 capsule (50,000 Units total) by mouth 2 (two) times a week., Disp: 8 capsule, Rfl: 2  Current Facility-Administered Medications:    triamcinolone  acetonide (KENALOG -40) injection 80 mg, 80 mg, Intramuscular, Once,   Allergies  Allergen Reactions   Oxy Ir [Oxycodone ] Itching   Dilaudid  [Hydromorphone ] Rash    ROS CONSTITUTIONAL: Negative for chills, fatigue,  fever,  MSK: see HPI      Objective:    PHYSICAL EXAM:   BP 136/84   Pulse 78   Temp (!) 97.2 F (36.2 C)   Ht 5' 3 (1.6 m)   Wt 183 lb (83 kg)   LMP 12/04/2022 (Exact Date)   SpO2 99%   BMI 32.42 kg/m    GEN: Well nourished, well developed, in no acute distress  Cardiac: RRR; no murmurs,  Respiratory:  normal respiratory rate and pattern with no distress - normal breath sounds with no rales, rhonchi, wheezes or rubs  MS: no deformity or atrophy - tender to palpation in lower thoracic area - says pain runs down right leg when steps onto right let Skin: warm and dry, no rash       Assessment & Plan:    Right-sided low back pain with  right-sided sciatica, unspecified chronicity -     Triamcinolone  Acetonide -     predniSONE ; Take 1 tablet (20 mg total) by mouth 3 (three) times daily for 3 days, THEN 1 tablet (20 mg total) 2 (two) times daily for 3 days, THEN 1 tablet (20 mg total) daily for 3 days.  Dispense: 18 tablet; Refill: 0 Recommend alternate heat/ice Use muscle relaxants as directed     Follow-up: Return if symptoms worsen or fail to improve.  An After Visit Summary was printed and given to the patient.  CAMIE JONELLE NICHOLAUS DEVONNA [facility] [phone]

## 2023-04-10 DIAGNOSIS — M546 Pain in thoracic spine: Secondary | ICD-10-CM | POA: Diagnosis not present

## 2023-04-10 DIAGNOSIS — M545 Low back pain, unspecified: Secondary | ICD-10-CM | POA: Diagnosis not present

## 2023-04-13 ENCOUNTER — Other Ambulatory Visit: Payer: Self-pay | Admitting: Physician Assistant

## 2023-04-13 ENCOUNTER — Other Ambulatory Visit (HOSPITAL_BASED_OUTPATIENT_CLINIC_OR_DEPARTMENT_OTHER): Payer: Self-pay

## 2023-04-13 MED ORDER — AZITHROMYCIN 250 MG PO TABS
ORAL_TABLET | ORAL | 0 refills | Status: AC
  Filled 2023-04-13: qty 6, 5d supply, fill #0

## 2023-04-13 MED ORDER — HYDROCODONE BIT-HOMATROP MBR 5-1.5 MG/5ML PO SOLN
5.0000 mL | Freq: Four times a day (QID) | ORAL | 0 refills | Status: DC | PRN
  Filled 2023-04-13: qty 120, 6d supply, fill #0

## 2023-04-14 ENCOUNTER — Other Ambulatory Visit (HOSPITAL_BASED_OUTPATIENT_CLINIC_OR_DEPARTMENT_OTHER): Payer: Self-pay

## 2023-04-14 ENCOUNTER — Other Ambulatory Visit: Payer: Self-pay | Admitting: Physician Assistant

## 2023-04-14 DIAGNOSIS — M545 Low back pain, unspecified: Secondary | ICD-10-CM

## 2023-04-14 MED ORDER — HYDROCODONE-ACETAMINOPHEN 10-325 MG PO TABS
1.0000 | ORAL_TABLET | Freq: Three times a day (TID) | ORAL | 0 refills | Status: AC | PRN
  Filled 2023-04-14: qty 15, 5d supply, fill #0

## 2023-04-21 ENCOUNTER — Ambulatory Visit: Payer: 59 | Admitting: Physician Assistant

## 2023-04-21 ENCOUNTER — Encounter: Payer: Self-pay | Admitting: Physician Assistant

## 2023-04-21 VITALS — BP 120/70 | HR 72 | Temp 97.2°F | Ht 63.0 in | Wt 183.0 lb

## 2023-04-21 DIAGNOSIS — U071 COVID-19: Secondary | ICD-10-CM | POA: Diagnosis not present

## 2023-04-21 DIAGNOSIS — R519 Headache, unspecified: Secondary | ICD-10-CM | POA: Insufficient documentation

## 2023-04-21 LAB — POC COVID19 BINAXNOW: SARS Coronavirus 2 Ag: POSITIVE — AB

## 2023-04-21 NOTE — Progress Notes (Signed)
 Acute Office Visit  Subjective:    Patient ID: Michelle Lamb, female    DOB: 1990-03-30, 33 y.o.   MRN: [id number]  Chief Complaint  Patient presents with   Headache    HPI: Patient is in today for complaints of headache and sore throat.  She states those symptoms started earlier this week - she actually did a COVID/flu test on Tuesday which was negative because she had symptoms at that time and several family members with same.  Pt had flu a few weeks ago, several family members with flu and strep as well Pt has had minimal congestion and cough.  Has had mild malaise and headache but no fever   Current Outpatient Medications:    albuterol (VENTOLIN HFA) 108 (90 Base) MCG/ACT inhaler, Inhale 2 puffs into the lungs every 6 (six) hours as needed for wheezing or shortness of breath., Disp: 1 each, Rfl: 5   cyanocobalamin (VITAMIN B12) 1000 MCG/ML injection, Inject 1 ML as directed once a month, Disp: 1 mL, Rfl: 2   fluticasone (FLONASE) 50 MCG/ACT nasal spray, Place 2 sprays into both nostrils daily. (Patient taking differently: Place 2 sprays into both nostrils as needed for allergies or rhinitis.), Disp: 16 g, Rfl: 6   HYDROcodone bit-homatropine (HYDROMET) 5-1.5 MG/5ML syrup, Take 5 mLs by mouth every 6 (six) hours as needed., Disp: 120 mL, Rfl: 0   linaclotide (LINZESS) 145 MCG CAPS capsule, Take 145 mcg by mouth daily before breakfast., Disp: , Rfl:    lisdexamfetamine (VYVANSE) 50 MG capsule, Take 1 capsule (50 mg total) by mouth daily., Disp: 30 capsule, Rfl: 0   LORazepam (ATIVAN) 1 MG tablet, Take 1 tablet (1 mg total) by mouth daily as needed., Disp: 30 tablet, Rfl: 1   meclizine (ANTIVERT) 25 MG tablet, Take 1 tablet (25 mg total) by mouth 3 (three) times daily as needed for dizziness., Disp: 30 tablet, Rfl: 0   Multiple Vitamin (MULTIVITAMIN WITH MINERALS) TABS tablet, Take 1 tablet by mouth daily., Disp: , Rfl:    ondansetron (ZOFRAN-ODT) 4 MG disintegrating tablet,  Take 1 tablet (4 mg total) by mouth every 8 (eight) hours as needed for nausea or vomiting., Disp: 90 tablet, Rfl: 0   pantoprazole (PROTONIX) 40 MG tablet, Take 1 tablet (40 mg total) by mouth daily., Disp: 30 tablet, Rfl: 11   predniSONE (DELTASONE) 20 MG tablet, Take 1 tablet (20 mg total) by mouth 3 (three) times daily for 3 days, THEN 1 tablet (20 mg total) 2 (two) times daily for 3 days, THEN 1 tablet (20 mg total) daily for 3 days., Disp: 18 tablet, Rfl: 0   Semaglutide,0.25 or 0.5MG /DOS, (OZEMPIC, 0.25 OR 0.5 MG/DOSE,) 2 MG/3ML SOPN, Inject 0.25 mg into the skin once a week., Disp: 3 mL, Rfl: 0   venlafaxine XR (EFFEXOR-XR) 75 MG 24 hr capsule, Take 1 capsule (75 mg total) by mouth daily with breakfast., Disp: 30 capsule, Rfl: 2   Vitamin D, Ergocalciferol, (DRISDOL) 1.25 MG (50000 UNIT) CAPS capsule, Take 1 capsule (50,000 Units total) by mouth 2 (two) times a week., Disp: 8 capsule, Rfl: 2  Allergies  Allergen Reactions   Oxy Ir [Oxycodone] Itching   Dilaudid [Hydromorphone] Rash    ROS CONSTITUTIONAL:seek HPI E/N/T: see HPI CARDIOVASCULAR: Negative for chest pain, dizziness, palpitations and pedal edema.  RESPIRATORY: see HPI GASTROINTESTINAL: Negative for abdominal pain, acid reflux symptoms, constipation, diarrhea, nausea and vomiting.  INTEGUMENTARY: Negative for rash.       Objective:  PHYSICAL EXAM:   BP 120/70   Pulse 72   Temp (!) 97.2 F (36.2 C)   Ht 5\' 3"  (1.6 m)   Wt 183 lb (83 kg)   LMP 12/04/2022 (Exact Date)   BMI 32.42 kg/m    GEN: Well nourished, well developed, in no acute distress  HEENT: normal external ears and nose - normal external auditory canals and TMS - - Lips, Teeth and Gums - normal  Oropharynx - normal mucosa, palate, and posterior pharynx Cardiac: RRR; no murmurs,  Respiratory:  normal respiratory rate and pattern with no distress - normal breath sounds with no rales, rhonchi, wheezes or rubs Skin: warm and dry, no rash  Office  Visit on 04/21/2023  Component Date Value Ref Range Status   SARS Coronavirus 2 Ag 04/21/2023 Positive (A)  Negative Final       Assessment & Plan:    Acute nonintractable headache, unspecified headache type -     POC COVID-19 BinaxNow Tylenol as needed COVID-19 Recommend rest, fluids and otc decongestants as needed    Follow-up: Return if symptoms worsen or fail to improve.  An After Visit Summary was printed and given to the patient.  Jettie Pagan [facility] [phone]

## 2023-04-24 ENCOUNTER — Encounter: Payer: Self-pay | Admitting: Physician Assistant

## 2023-04-24 ENCOUNTER — Other Ambulatory Visit (HOSPITAL_BASED_OUTPATIENT_CLINIC_OR_DEPARTMENT_OTHER): Payer: Self-pay

## 2023-04-24 ENCOUNTER — Ambulatory Visit (INDEPENDENT_AMBULATORY_CARE_PROVIDER_SITE_OTHER): Payer: 59 | Admitting: Physician Assistant

## 2023-04-24 ENCOUNTER — Ambulatory Visit (HOSPITAL_BASED_OUTPATIENT_CLINIC_OR_DEPARTMENT_OTHER)
Admission: RE | Admit: 2023-04-24 | Discharge: 2023-04-24 | Disposition: A | Discharge status: Home / Self Care | Payer: 59 | Source: Ambulatory Visit | Attending: Physician Assistant | Admitting: Radiology

## 2023-04-24 VITALS — BP 132/78 | HR 83 | Temp 97.2°F | Ht 63.0 in | Wt 186.0 lb

## 2023-04-24 DIAGNOSIS — U071 COVID-19: Secondary | ICD-10-CM

## 2023-04-24 DIAGNOSIS — R0689 Other abnormalities of breathing: Secondary | ICD-10-CM | POA: Diagnosis not present

## 2023-04-24 MED ORDER — DEXTROMETHORPHAN HBR 15 MG/5ML PO SYRP
10.0000 mL | ORAL_SOLUTION | Freq: Four times a day (QID) | ORAL | 0 refills | Status: DC | PRN
  Filled 2023-04-24 – 2023-04-27 (×2): qty 118, 3d supply, fill #0

## 2023-04-24 NOTE — Progress Notes (Signed)
 Acute Office Visit  Subjective:    Patient ID: Michelle Lamb, female    DOB: 19-Feb-1991, 33 y.o.   MRN: [id number]  Chief Complaint  Patient presents with   Shortness of breath    HPI: Patient is in today for worsening cough Discussed the use of AI scribe software for clinical note transcription with the patient, who gave verbal consent to proceed.  History of Present Illness   The patient, with a history of COVID-19, flu, strep throat, back pain, hip pain, and weight loss surgery, presents with a cough, shortness of breath, and chest discomfort. The symptoms started the day before the visit, following a trip to the grocery store. The patient describes the chest discomfort as feeling like "somebody's sitting on my chest" and reports needing to catch her breath after talking for too long or moving around. The patient has been managing the cough with cough medicine, but reports itching as a side effect. The patient also reports worsening back and hip pain, despite undergoing physical therapy. The patient mentions a history of weight loss surgery and subsequent back problems. The patient has been isolating at home due to the COVID-19 diagnosis and has been trying to prevent the spread of the virus within the household.       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   Blood transfusion without reported diagnosis 2018   postpartum   Chest pain of uncertain etiology 06/08/2022   06/15/22 Myocardial perfusion - normal, 09/15/22 long term heart monitor in Epic   Chronic kidney disease    right kidney at 35%   Complication of  anesthesia    Slow to wake up   Constipation 06/28/2022   Diabetes mellitus without complication (HCC)    NOT AN ISSUE SINCE GASTRIC BYPASS IN 2022. TYPE 2 LAST DOSE FRIDAY December 24, 2020 (METFORMIN)   Dizziness 08/22/2022   Dizziness 2024   Follows w/ PCP and cardiology.   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  Report date is 02/23/2022.      The CancerNext-Expanded gene panel offered by [facility] and includes sequencing, rearrangement, and RNA analysis for the following 77 genes: AIP, ALK, APC, ATM, AXIN2, BAP1, BARD1, BLM, BMPR1A, BRCA1, BRCA2, BRIP1, CDC73, CDH1, CDK4, CDKN1B, CDKN2A, CHEK2, CTNNA1, DICER1, FANCC, FH, FLCN, GALNT12, KIF1B, LZTR1,   Heart murmur    as a baby   Hepatic steatosis 09/08/2021   History of herpes simplex infection    History of partial nephrectomy 03/21/2022   Formatting of this note might be different from the original. 03/15/2021: Underwent right partial nephrectomy by Dr. Laverle Patter for right renal neoplasm. Pathology: Angiomyolipoma. Formatting of this note might be different from the original. 03/15/2021: Underwent right partial nephrectomy by Dr. Laverle Patter for right renal neoplasm. Pathology: Angiomyolipoma.   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   Morbid obesity (HCC) 06/02/2020   Neoplasm of right kidney    benign tumor of kidney   Obesity (BMI 30.0-34.9) 06/08/2022   Obstructive sleep apnea 10/29/2019   hx of before gastric bypass, no longer uses CPAP   Racing heart beat 08/22/2022   see 09/15/22 Long  term heart monitor in Epic   Seasonal allergies    Ureteral obstruction, right 07/15/2021   Vaginal delivery 2009, 2010, 2015   Vitamin D insufficiency 09/30/2020   Weight loss 09/30/2020    Past Surgical History:  Procedure Laterality Date   CESAREAN SECTION  09/29/2016   CHOLECYSTECTOMY     COLONOSCOPY WITH ESOPHAGOGASTRODUODENOSCOPY (EGD)  07/22/2022   CYSTOSCOPY N/A 12/27/2022   Procedure: CYSTOSCOPY;  Surgeon: Earley Favor, MD;  Location: [facility];  Service: Gynecology;  Laterality: N/A;   CYSTOSCOPY W/ URETERAL STENT PLACEMENT Right 07/15/2021   Procedure: CYSTOSCOPY WITH RETROGRADE PYELOGRAM/URETERAL STENT PLACEMENT;  Surgeon: Heloise Purpura, MD;  Location: [facility];  Service: Urology;  Laterality: Right;   CYSTOSCOPY WITH RETROGRADE PYELOGRAM, URETEROSCOPY AND STENT PLACEMENT Right 05/03/2021   Procedure: CYSTOSCOPY WITH RIGHT RETROGRADE PYELOGRAM, URETEROSCOPY;  Surgeon: Heloise Purpura, MD;  Location: [facility];  Service: Urology;  Laterality: Right;   DILATION AND CURETTAGE OF UTERUS N/A 12/31/2015   Procedure: SUCTION DILATATION AND CURETTAGE;  Surgeon: [place] Bing, MD;  Location: [facility];  Service: Gynecology;  Laterality: N/A;   DILATION AND EVACUATION N/A 01/01/2016   Procedure: DILATATION AND EVACUATION;  Surgeon: Tilda Burrow, MD;  Location: [facility];  Service: Gynecology;  Laterality: N/A;   ESOPHAGOGASTRODUODENOSCOPY     GASTRIC BYPASS  06/02/2020   IR NEPHRO TUBE REMOV/FL  07/16/2021   IR NEPHROSTOMY EXCHANGE RIGHT  05/12/2021   IR NEPHROSTOMY PLACEMENT RIGHT  05/05/2021   ROBOT ASSISTED PYELOPLASTY Right 07/15/2021   Procedure: XI ROBOTIC ASSISTED  LAPAROSCOPIC PYELOPLASTY;  Surgeon: Heloise Purpura, MD;  Location: [facility];  Service: Urology;  Laterality: Right;   ROBOTIC ASSISTED LAPAROSCOPIC HYSTERECTOMY AND SALPINGECTOMY Bilateral 12/27/2022   Procedure: XI ROBOTIC ASSISTED LAPAROSCOPIC HYSTERECTOMY AND SALPINGECTOMY;  Surgeon: Earley Favor, MD;  Location: [facility];  Service: Gynecology;  Laterality: Bilateral;   ROBOTIC ASSITED PARTIAL NEPHRECTOMY Right 03/15/2021   Procedure: XI ROBOTIC ASSITED PARTIAL NEPHRECTOMY;  Surgeon: Heloise Purpura, MD;  Location: [facility];  Service: Urology;  Laterality: Right;   TONSILLECTOMY     TUBAL LIGATION     UPPER GASTROINTESTINAL ENDOSCOPY     w/dilation    Family History  Problem Relation Age of Onset   Hypertension Mother    Thyroid disease Mother    Cervical cancer Mother        dx 4s   Stomach cancer Maternal Grandmother        dx 57s   Leukemia Maternal Grandfather        d. 70s   Depression Paternal Grandmother    Cancer Paternal Grandfather        dx after 46; mets   Breast cancer Maternal Great-grandmother        dx <50; mets; MGF's mother   Stomach cancer Other        MGM's brother; dx after 58   Cancer Other        MGF's sisters x3; unknown primary   Colon cancer Neg Hx  Colon polyps Neg Hx    Esophageal cancer Neg Hx    Rectal cancer Neg Hx     Social History   Socioeconomic History   Marital status: Married    Spouse name: Not on file   Number of children: 9   Years of education: 13   Highest education level: Associate degree: occupational, Scientist, product/process development, or vocational program  Occupational History   Occupation: Scientist, forensic  Tobacco Use   Smoking status: Never   Smokeless tobacco: Never  Vaping Use   Vaping status: Never Used  Substance and Sexual Activity   Alcohol use: Yes    Comment: occasionally   Drug use: Never   Sexual activity: Not Currently    Partners: Male    Birth control/protection: Surgical    Comment: tubal, hysterectomy  Other Topics Concern   Not on file  Social History Narrative   Not on file   Social Drivers of Health   Financial Resource Strain: Low Risk  (08/21/2022)   Overall Financial Resource Strain (CARDIA)    Difficulty of Paying Living Expenses: Not hard at all  Food  Insecurity: No Food Insecurity (08/21/2022)   Hunger Vital Sign    Worried About Running Out of Food in the Last Year: Never true    Ran Out of Food in the Last Year: Never true  Transportation Needs: No Transportation Needs (08/21/2022)   PRAPARE - Administrator, Civil Service (Medical): No    Lack of Transportation (Non-Medical): No  Physical Activity: Insufficiently Active (08/21/2022)   Exercise Vital Sign    Days of Exercise per Week: 3 days    Minutes of Exercise per Session: 40 min  Stress: Stress Concern Present (08/21/2022)   [employer] of Occupational Health - Occupational Stress Questionnaire    Feeling of Stress : To some extent  Social Connections: Socially Integrated (08/21/2022)   Social Connection and Isolation Panel [NHANES]    Frequency of Communication with Friends and Family: More than three times a week    Frequency of Social Gatherings with Friends and Family: More than three times a week    Attends Religious Services: 1 to 4 times per year    Active Member of [employer] or Organizations: Yes    Attends Engineer, structural: More than 4 times per year    Marital Status: Married  Catering manager Violence: Not At Risk (04/07/2023)   Humiliation, Afraid, Rape, and Kick questionnaire    Fear of Current or Ex-Partner: No    Emotionally Abused: No    Physically Abused: No    Sexually Abused: No    Outpatient Medications Prior to Visit  Medication Sig Dispense Refill   albuterol (VENTOLIN HFA) 108 (90 Base) MCG/ACT inhaler Inhale 2 puffs into the lungs every 6 (six) hours as needed for wheezing or shortness of breath. 1 each 5   cyanocobalamin (VITAMIN B12) 1000 MCG/ML injection Inject 1 ML as directed once a month 1 mL 2   fluticasone (FLONASE) 50 MCG/ACT nasal spray Place 2 sprays into both nostrils daily. (Patient taking differently: Place 2 sprays into both nostrils as needed for allergies or rhinitis.) 16 g 6   linaclotide (LINZESS) 145 MCG  CAPS capsule Take 145 mcg by mouth daily before breakfast.     lisdexamfetamine (VYVANSE) 50 MG capsule Take 1 capsule (50 mg total) by mouth daily. 30 capsule 0   LORazepam (ATIVAN) 1 MG tablet Take 1 tablet (1 mg total) by mouth daily  as needed. 30 tablet 1   meclizine (ANTIVERT) 25 MG tablet Take 1 tablet (25 mg total) by mouth 3 (three) times daily as needed for dizziness. 30 tablet 0   Multiple Vitamin (MULTIVITAMIN WITH MINERALS) TABS tablet Take 1 tablet by mouth daily.     ondansetron (ZOFRAN-ODT) 4 MG disintegrating tablet Take 1 tablet (4 mg total) by mouth every 8 (eight) hours as needed for nausea or vomiting. 90 tablet 0   pantoprazole (PROTONIX) 40 MG tablet Take 1 tablet (40 mg total) by mouth daily. 30 tablet 11   venlafaxine XR (EFFEXOR-XR) 75 MG 24 hr capsule Take 1 capsule (75 mg total) by mouth daily with breakfast. 30 capsule 2   Vitamin D, Ergocalciferol, (DRISDOL) 1.25 MG (50000 UNIT) CAPS capsule Take 1 capsule (50,000 Units total) by mouth 2 (two) times a week. 8 capsule 2   Semaglutide,0.25 or 0.5MG /DOS, (OZEMPIC, 0.25 OR 0.5 MG/DOSE,) 2 MG/3ML SOPN Inject 0.25 mg into the skin once a week. (Patient not taking: Reported on 04/24/2023) 3 mL 0   HYDROcodone bit-homatropine (HYDROMET) 5-1.5 MG/5ML syrup Take 5 mLs by mouth every 6 (six) hours as needed. 120 mL 0   No facility-administered medications prior to visit.    Allergies  Allergen Reactions   Oxy Ir [Oxycodone] Itching   Dilaudid [Hydromorphone] Rash    Review of Systems  Constitutional:  Negative for appetite change, fatigue and fever.  HENT:  Positive for congestion. Negative for ear pain, sinus pressure and sore throat.   Respiratory:  Positive for cough and shortness of breath. Negative for chest tightness and wheezing.   Cardiovascular:  Negative for chest pain and palpitations.  Gastrointestinal:  Negative for abdominal pain, constipation, diarrhea, nausea and vomiting.  Genitourinary:  Negative for  dysuria and hematuria.  Musculoskeletal:  Negative for arthralgias, back pain, joint swelling and myalgias.  Skin:  Negative for rash.  Neurological:  Negative for dizziness, weakness and headaches.  Psychiatric/Behavioral:  Negative for dysphoric mood. The patient is not nervous/anxious.        Objective:        04/24/2023   11:16 AM 04/21/2023    9:50 AM 04/07/2023   10:30 AM  Vitals with BMI  Height 5\' 3"  5\' 3"  5\' 3"   Weight 186 lbs 183 lbs 183 lbs  BMI 32.96 32.43 32.43  Systolic 132 120 981  Diastolic 78 70 84  Pulse 83 72 78    No data found.    Physical Exam Vitals reviewed.  Constitutional:      Appearance: Normal appearance.  Neck:     Vascular: No carotid bruit.  Cardiovascular:     Rate and Rhythm: Normal rate and regular rhythm.     Heart sounds: Normal heart sounds.  Pulmonary:     Effort: Pulmonary effort is normal.     Breath sounds: Examination of the left-lower field reveals decreased breath sounds. Decreased breath sounds present.  Abdominal:     General: Bowel sounds are normal.     Palpations: Abdomen is soft.     Tenderness: There is no abdominal tenderness.  Neurological:     Mental Status: She is alert and oriented to person, place, and time.  Psychiatric:        Mood and Affect: Mood normal.        Behavior: Behavior normal.     Health Maintenance Due  Topic Date Due   Pneumococcal Vaccine 31-62 Years old (1 of 2 - PCV) Never done  Cervical Cancer Screening (HPV/Pap Cotest)  Never done    There are no preventive care reminders to display for this patient.   Lab Results  Component Value Date   TSH 1.140 03/28/2023   Lab Results  Component Value Date   WBC 10.5 03/28/2023   HGB 12.1 03/28/2023   HCT 39.0 03/28/2023   MCV 84 03/28/2023   PLT 355 03/28/2023   Lab Results  Component Value Date   NA 139 03/28/2023   K 4.3 03/28/2023   CO2 21 03/28/2023   GLUCOSE 80 03/28/2023   BUN 15 03/28/2023   CREATININE 0.60  03/28/2023   BILITOT 0.6 03/28/2023   ALKPHOS 99 03/28/2023   AST 40 03/28/2023   ALT 58 (H) 03/28/2023   PROT 6.8 03/28/2023   ALBUMIN 4.6 03/28/2023   CALCIUM 9.3 03/28/2023   ANIONGAP 8 03/15/2022   EGFR 121 03/28/2023   Lab Results  Component Value Date   CHOL 184 08/17/2022   Lab Results  Component Value Date   HDL 74 08/17/2022   Lab Results  Component Value Date   LDLCALC 99 08/17/2022   Lab Results  Component Value Date   TRIG 58 08/17/2022   Lab Results  Component Value Date   CHOLHDL 2.5 08/17/2022   Lab Results  Component Value Date   HGBA1C 5.3 08/17/2022       Assessment & Plan:  Decreased lung sounds Assessment & Plan: Recent positive test with new onset shortness of breath and cough. No fever or congestion. Decreased lung sounds on lower part of lungs. -Order chest x-ray to assess for possible pneumonia or other complications. -Change cough suppressant to guaifenesin due to itching with current medication (Hydromorphone).  Orders: -     DG Chest 2 View; Future  COVID-19 Assessment & Plan: Recent positive test with new onset shortness of breath and cough. No fever or congestion. Decreased lung sounds on lower part of lungs. -Order chest x-ray to assess for possible pneumonia or other complications. -Change cough suppressant to guaifenesin due to itching with current medication (Hydromorphone).  Orders: -     Dextromethorphan HBr; Take 10 mLs (30 mg total) by mouth 4 (four) times daily as needed for cough.  Dispense: 118 mL; Refill: 0     Meds ordered this encounter  Medications   dextromethorphan 15 MG/5ML syrup    Sig: Take 10 mLs (30 mg total) by mouth 4 (four) times daily as needed for cough.    Dispense:  118 mL    Refill:  0    Orders Placed This Encounter  Procedures   DG Chest 2 View    Chronic Back Pain Ongoing pain, worsened after weight loss surgery and physical therapy. -Consider referral to a back specialist if pain  continues after completion of physical therapy.  Flu Recent history of flu in the household. -Continue supportive care and monitor for recurrence.  General Health Maintenance -Continue taking daily vitamins. -Encourage consistent medication adherence.       Follow-up: No follow-ups on file.  An After Visit Summary was printed and given to the patient.    I,Lauren M Auman,acting as a Neurosurgeon for [employer], PA.,have documented all relevant documentation on the behalf of Langley Gauss, PA,as directed by  Langley Gauss, PA while in the presence of Langley Gauss, [place].    Langley Gauss, [place] [facility] [phone]

## 2023-04-27 ENCOUNTER — Other Ambulatory Visit (HOSPITAL_BASED_OUTPATIENT_CLINIC_OR_DEPARTMENT_OTHER): Payer: Self-pay

## 2023-04-30 DIAGNOSIS — R0689 Other abnormalities of breathing: Secondary | ICD-10-CM | POA: Insufficient documentation

## 2023-04-30 NOTE — Assessment & Plan Note (Signed)
 Recent positive test with new onset shortness of breath and cough. No fever or congestion. Decreased lung sounds on lower part of lungs. -Order chest x-ray to assess for possible pneumonia or other complications. -Change cough suppressant to guaifenesin due to itching with current medication (Hydromorphone).

## 2023-05-01 ENCOUNTER — Other Ambulatory Visit (HOSPITAL_BASED_OUTPATIENT_CLINIC_OR_DEPARTMENT_OTHER): Payer: Self-pay

## 2023-05-01 ENCOUNTER — Encounter: Payer: Self-pay | Admitting: Physician Assistant

## 2023-05-01 ENCOUNTER — Ambulatory Visit (INDEPENDENT_AMBULATORY_CARE_PROVIDER_SITE_OTHER): Admitting: Physician Assistant

## 2023-05-01 ENCOUNTER — Encounter: Payer: Self-pay | Admitting: Hematology and Oncology

## 2023-05-01 VITALS — BP 118/90 | HR 87 | Temp 98.2°F | Resp 18 | Ht 63.0 in | Wt 186.0 lb

## 2023-05-01 DIAGNOSIS — M545 Low back pain, unspecified: Secondary | ICD-10-CM | POA: Diagnosis not present

## 2023-05-01 DIAGNOSIS — R82998 Other abnormal findings in urine: Secondary | ICD-10-CM

## 2023-05-01 DIAGNOSIS — M546 Pain in thoracic spine: Secondary | ICD-10-CM | POA: Diagnosis not present

## 2023-05-01 DIAGNOSIS — F418 Other specified anxiety disorders: Secondary | ICD-10-CM

## 2023-05-01 DIAGNOSIS — M5441 Lumbago with sciatica, right side: Secondary | ICD-10-CM | POA: Diagnosis not present

## 2023-05-01 DIAGNOSIS — D508 Other iron deficiency anemias: Secondary | ICD-10-CM | POA: Diagnosis not present

## 2023-05-01 DIAGNOSIS — R41840 Attention and concentration deficit: Secondary | ICD-10-CM

## 2023-05-01 DIAGNOSIS — R42 Dizziness and giddiness: Secondary | ICD-10-CM

## 2023-05-01 LAB — POCT URINALYSIS DIP (CLINITEK)
Blood, UA: NEGATIVE
Glucose, UA: NEGATIVE mg/dL
Ketones, POC UA: NEGATIVE mg/dL
Nitrite, UA: NEGATIVE
Spec Grav, UA: 1.025 (ref 1.010–1.025)
Urobilinogen, UA: 0.2 U/dL
pH, UA: 6 (ref 5.0–8.0)

## 2023-05-01 LAB — GLUCOSE, POCT (MANUAL RESULT ENTRY): POC Glucose: 59 mg/dL — AB (ref 70–99)

## 2023-05-01 MED ORDER — LISDEXAMFETAMINE DIMESYLATE 50 MG PO CAPS
50.0000 mg | ORAL_CAPSULE | Freq: Every day | ORAL | 0 refills | Status: DC
  Filled 2023-05-01: qty 30, 30d supply, fill #0

## 2023-05-01 MED ORDER — LORAZEPAM 1 MG PO TABS
1.0000 mg | ORAL_TABLET | Freq: Every day | ORAL | 1 refills | Status: DC | PRN
  Filled 2023-05-01: qty 30, 30d supply, fill #0

## 2023-05-01 MED ORDER — VITAMIN D (ERGOCALCIFEROL) 1.25 MG (50000 UNIT) PO CAPS: ORAL_CAPSULE | ORAL | Status: DC

## 2023-05-01 MED ORDER — VENLAFAXINE HCL ER 150 MG PO CP24
150.0000 mg | ORAL_CAPSULE | Freq: Every day | ORAL | 2 refills | Status: DC
Start: 2023-05-01 — End: 2023-07-26
  Filled 2023-05-01: qty 30, 30d supply, fill #0
  Filled 2023-05-29: qty 30, 30d supply, fill #1
  Filled 2023-07-02: qty 30, 30d supply, fill #2

## 2023-05-01 NOTE — Progress Notes (Signed)
 Subjective:  Patient ID: Michelle Lamb, female    DOB: [birth year]  Age: 33 y.o. MRN: [id number]  Chief Complaint  Patient presents with   Dizziness    HPI Pt states that over the weekend she started having episodes again of intermittent dizziness/light headedness.  Can occur at activity or at rest - does not correlate necessarily with eating but sometimes eating makes her feel better.  She does have associated nausea when she feels dizzy. She denies chest pain or shortness of breath.  She does have meclizine and zofran to use as needed Pt with low back pain with sciatica and receiving therapy for that now - denies urine symptoms Denies abdominal pain Pt did have flu about 4 weeks ago and also diagnosed with COVID 2 weeks ago  Pt feels her anxiety/depressive symptoms have  worsened recently as well - had been doing well on wellbutrin XL 75mg   Pt is a bariatric pt and eats small meals/snacks through day - random glucose today in office at 59     10/19/2022    4:23 PM 08/22/2022    3:31 PM 11/30/2021   11:14 AM 09/08/2021    9:57 AM 02/08/2021    4:50 PM  Depression screen PHQ 2/9  Decreased Interest 1 1 0 0 0  Down, Depressed, Hopeless 1 2 1  0 1  PHQ - 2 Score 2 3 1  0 1  Altered sleeping 2 3 0 0 2  Tired, decreased energy 1 1 2  0 2  Change in appetite 1  0 0 0  Feeling bad or failure about yourself   0 0 0 0  Trouble concentrating 0 3 3 0 0  Moving slowly or fidgety/restless 0 0 0 0 0  Suicidal thoughts 0 0 0 0 0  PHQ-9 Score 6 10 6  0 5  Difficult doing work/chores Somewhat difficult  Somewhat difficult Not difficult at all Somewhat difficult        09/22/2021    9:47 AM 09/24/2021    2:00 PM 11/11/2021    9:15 AM 11/14/2022   10:55 AM 12/14/2022   12:06 PM  Fall Risk  Falls in the past year?    0 0  Was there an injury with Fall?    0 0  Fall Risk Category Calculator    0 0  (RETIRED) Patient Fall Risk Level Low fall risk Low fall risk Low fall risk    Patient at  Risk for Falls Due to    No Fall Risks No Fall Risks  Fall risk Follow up    Falls evaluation completed Falls evaluation completed     ROS CONSTITUTIONAL: see HPI E/N/T: Negative for ear pain, nasal congestion and sore throat.  CARDIOVASCULAR: Negative for chest pain, dizziness, palpitations and pedal edema.  RESPIRATORY: Negative for recent cough and dyspnea.  GASTROINTESTINAL: Negative for abdominal pain, acid reflux symptoms, constipation, diarrhea, - has had some nausea MSK: see HPI INTEGUMENTARY: Negative for rash.   PSYCHIATRIC: see HPI   Current Outpatient Medications:    ferrous sulfate 325 (65 FE) MG EC tablet, Take 325 mg by mouth daily with breakfast., Disp: , Rfl:    venlafaxine XR (EFFEXOR XR) 150 MG 24 hr capsule, Take 1 capsule (150 mg total) by mouth daily with breakfast., Disp: 30 capsule, Rfl: 2   albuterol (VENTOLIN HFA) 108 (90 Base) MCG/ACT inhaler, Inhale 2 puffs into the lungs every 6 (six) hours as needed for wheezing or shortness of breath., Disp: 1  each, Rfl: 5   cyanocobalamin (VITAMIN B12) 1000 MCG/ML injection, Inject 1 ML as directed once a month, Disp: 1 mL, Rfl: 2   fluticasone (FLONASE) 50 MCG/ACT nasal spray, Place 2 sprays into both nostrils daily. (Patient taking differently: Place 2 sprays into both nostrils as needed for allergies or rhinitis.), Disp: 16 g, Rfl: 6   linaclotide (LINZESS) 145 MCG CAPS capsule, Take 145 mcg by mouth daily before breakfast., Disp: , Rfl:    lisdexamfetamine (VYVANSE) 50 MG capsule, Take 1 capsule (50 mg total) by mouth daily., Disp: 30 capsule, Rfl: 0   LORazepam (ATIVAN) 1 MG tablet, Take 1 tablet (1 mg total) by mouth daily as needed., Disp: 30 tablet, Rfl: 1   meclizine (ANTIVERT) 25 MG tablet, Take 1 tablet (25 mg total) by mouth 3 (three) times daily as needed for dizziness., Disp: 30 tablet, Rfl: 0   Multiple Vitamin (MULTIVITAMIN WITH MINERALS) TABS tablet, Take 1 tablet by mouth daily., Disp: , Rfl:    ondansetron  (ZOFRAN-ODT) 4 MG disintegrating tablet, Take 1 tablet (4 mg total) by mouth every 8 (eight) hours as needed for nausea or vomiting., Disp: 90 tablet, Rfl: 0   pantoprazole (PROTONIX) 40 MG tablet, Take 1 tablet (40 mg total) by mouth daily., Disp: 30 tablet, Rfl: 11   Vitamin D, Ergocalciferol, (DRISDOL) 1.25 MG (50000 UNIT) CAPS capsule, Twice weekly, Disp: , Rfl:   Past Medical History:  Diagnosis Date   Abnormal menses 08/22/2022   Absolute anemia 10/12/2021   Acute cystitis with hematuria 12/22/2020   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   Blood transfusion without reported diagnosis 2018   postpartum   Chest pain of uncertain etiology 06/08/2022   06/15/22 Myocardial perfusion - normal, 09/15/22 long term heart monitor in Epic   Chronic kidney disease    right kidney at 35%   Complication of anesthesia    Slow to wake up   Constipation 06/28/2022   Diabetes mellitus without complication (HCC)    NOT AN ISSUE SINCE GASTRIC BYPASS IN 2022. TYPE 2 LAST DOSE FRIDAY December 24, 2020 (METFORMIN)   Dizziness 08/22/2022   Dizziness 2024   Follows w/ PCP and cardiology.   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  Report date is 02/23/2022.      The CancerNext-Expanded gene panel offered by [facility] and includes sequencing, rearrangement, and RNA analysis for the following 77 genes: AIP, ALK, APC, ATM, AXIN2, BAP1, BARD1, BLM, BMPR1A, BRCA1, BRCA2, BRIP1, CDC73, CDH1, CDK4, CDKN1B, CDKN2A, CHEK2, CTNNA1, DICER1, FANCC, FH, FLCN, GALNT12, KIF1B, LZTR1,   Heart murmur    as a baby   Hepatic steatosis 09/08/2021   History of herpes simplex infection    History  of partial nephrectomy 03/21/2022   Formatting of this note might be different from the original. 03/15/2021: Underwent right partial nephrectomy by Dr. Laverle Patter for right renal neoplasm. Pathology: Angiomyolipoma. Formatting of this note might be different from the original. 03/15/2021: Underwent right partial nephrectomy by Dr. Laverle Patter for right renal neoplasm. Pathology: Angiomyolipoma.   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   Morbid obesity (HCC) 06/02/2020   Neoplasm of right kidney    benign tumor of kidney   Obesity (BMI 30.0-34.9) 06/08/2022   Obstructive sleep apnea 10/29/2019   hx of before gastric bypass, no longer uses CPAP   Racing heart beat 08/22/2022   see 09/15/22 Long term heart monitor in Epic   Seasonal allergies    Ureteral obstruction, right 07/15/2021   Vaginal delivery 2009, 2010, 2015   Vitamin D insufficiency 09/30/2020   Weight loss 09/30/2020   Objective:  PHYSICAL EXAM:   BP (!) 118/90   Pulse 87   Temp 98.2 F (36.8 C)   Resp 18   Ht 5\' 3"  (1.6 m)   Wt 186 lb (84.4 kg)   LMP 12/04/2022 (Exact Date)   SpO2 98%   BMI 32.95 kg/m    GEN: Well nourished, well developed, in no acute distress   Cardiac: RRR; no murmurs, rubs, or gallops,no edema - Respiratory:  normal respiratory rate and pattern with no distress - normal breath sounds with no rales, rhonchi, wheezes or rubs  Psych: euthymic mood, appropriate affect and demeanor  Office Visit on 05/01/2023  Component Date Value Ref Range Status   POC Glucose 05/01/2023 59 (A)  70 - 99 mg/dl Final   Office Visit on 05/01/2023  Component Date Value Ref Range  Status   Color, UA 05/01/2023 yellow  yellow Final   Clarity, UA 05/01/2023 cloudy (A)  clear Final   Glucose, UA 05/01/2023 negative  negative mg/dL Final   Bilirubin, UA 16/11/9602 small (A)  negative Final   Ketones, POC UA 05/01/2023 negative  negative mg/dL Final   Spec Grav, UA 54/10/8117 1.025  1.010 - 1.025 Final   Blood, UA 05/01/2023 negative  negative Final   pH, UA 05/01/2023 6.0  5.0 - 8.0 Final   POC PROTEIN,UA 05/01/2023 trace  negative, trace Final   Urobilinogen, UA 05/01/2023 0.2  0.2 or 1.0 E.U./dL Final   Nitrite, UA 14/78/2956 Negative  Negative Final   Leukocytes, UA 05/01/2023 Trace (A)  Negative Final   POC Glucose 05/01/2023 59 (A)  70 - 99 mg/dl Final     Assessment & Plan:    Light headedness -     CBC with Differential/Platelet -     Comprehensive metabolic panel -     POCT glucose (manual entry)  Other iron deficiency anemia -     CBC with Differential/Platelet -     Iron, TIBC and Ferritin Panel  Right-sided low back pain with right-sided sciatica, unspecified chronicity -     POCT URINALYSIS DIP (CLINITEK)  Anxiety with depression -     LORazepam; Take 1 tablet (1 mg total) by mouth daily as needed.  Dispense: 30 tablet; Refill: 1 -     Venlafaxine HCl ER; Take 1 capsule (150 mg total) by mouth daily with breakfast.  Dispense: 30 capsule; Refill: 2  Attention or concentration deficit -     Lisdexamfetamine Dimesylate; Take 1 capsule (50 mg total) by mouth daily.  Dispense: 30 capsule; Refill: 0  Leukocytes in urine -     Urine Culture  Other orders -     Vitamin D (Ergocalciferol); Twice weekly  Follow-up: Return in about 3 weeks (around 05/22/2023).  An After Visit Summary was printed and given to the patient.  Jettie Pagan [facility] [phone]

## 2023-05-02 ENCOUNTER — Other Ambulatory Visit (HOSPITAL_BASED_OUTPATIENT_CLINIC_OR_DEPARTMENT_OTHER): Payer: Self-pay

## 2023-05-02 LAB — COMPREHENSIVE METABOLIC PANEL
ALT: 28 IU/L (ref 0–32)
AST: 28 IU/L (ref 0–40)
Albumin: 4.6 g/dL (ref 3.9–4.9)
Alkaline Phosphatase: 91 IU/L (ref 44–121)
BUN/Creatinine Ratio: 24 — ABNORMAL HIGH (ref 9–23)
BUN: 12 mg/dL (ref 6–20)
Bilirubin Total: 0.5 mg/dL (ref 0.0–1.2)
CO2: 24 mmol/L (ref 20–29)
Calcium: 9.2 mg/dL (ref 8.7–10.2)
Chloride: 100 mmol/L (ref 96–106)
Creatinine, Ser: 0.51 mg/dL — ABNORMAL LOW (ref 0.57–1.00)
Globulin, Total: 2.3 g/dL (ref 1.5–4.5)
Glucose: 74 mg/dL (ref 70–99)
Potassium: 4.5 mmol/L (ref 3.5–5.2)
Sodium: 137 mmol/L (ref 134–144)
Total Protein: 6.9 g/dL (ref 6.0–8.5)
eGFR: 126 mL/min/{1.73_m2} (ref >59)

## 2023-05-02 LAB — CBC WITH DIFFERENTIAL/PLATELET
Basophils Absolute: 0.1 10*3/uL (ref 0.0–0.2)
Basos: 1 %
EOS (ABSOLUTE): 0.1 10*3/uL (ref 0.0–0.4)
Eos: 1 %
Hematocrit: 36.8 % (ref 34.0–46.6)
Hemoglobin: 11.4 g/dL (ref 11.1–15.9)
Immature Grans (Abs): 0 10*3/uL (ref 0.0–0.1)
Immature Granulocytes: 0 %
Lymphocytes Absolute: 3.1 10*3/uL (ref 0.7–3.1)
Lymphs: 26 %
MCH: 26.3 pg — ABNORMAL LOW (ref 26.6–33.0)
MCHC: 31 g/dL — ABNORMAL LOW (ref 31.5–35.7)
MCV: 85 fL (ref 79–97)
Monocytes Absolute: 0.7 10*3/uL (ref 0.1–0.9)
Monocytes: 6 %
Neutrophils Absolute: 7.7 10*3/uL — ABNORMAL HIGH (ref 1.4–7.0)
Neutrophils: 66 %
Platelets: 385 10*3/uL (ref 150–450)
RBC: 4.33 x10E6/uL (ref 3.77–5.28)
RDW: 15 % (ref 11.7–15.4)
WBC: 11.7 10*3/uL — ABNORMAL HIGH (ref 3.4–10.8)

## 2023-05-02 LAB — IRON,TIBC AND FERRITIN PANEL
Ferritin: 12 ng/mL — ABNORMAL LOW (ref 15–150)
Iron Saturation: 9 % — CL (ref 15–55)
Iron: 37 ug/dL (ref 27–159)
Total Iron Binding Capacity: 431 ug/dL (ref 250–450)
UIBC: 394 ug/dL (ref 131–425)

## 2023-05-03 ENCOUNTER — Other Ambulatory Visit: Payer: Self-pay | Admitting: Physician Assistant

## 2023-05-03 ENCOUNTER — Other Ambulatory Visit (HOSPITAL_BASED_OUTPATIENT_CLINIC_OR_DEPARTMENT_OTHER): Payer: Self-pay

## 2023-05-03 DIAGNOSIS — N3000 Acute cystitis without hematuria: Secondary | ICD-10-CM

## 2023-05-03 LAB — URINE CULTURE

## 2023-05-03 MED ORDER — NITROFURANTOIN MONOHYD MACRO 100 MG PO CAPS
100.0000 mg | ORAL_CAPSULE | Freq: Two times a day (BID) | ORAL | 0 refills | Status: DC
Start: 2023-05-03 — End: 2023-06-14
  Filled 2023-05-03: qty 20, 10d supply, fill #0

## 2023-05-04 ENCOUNTER — Other Ambulatory Visit: Payer: Self-pay | Admitting: Family Medicine

## 2023-05-04 ENCOUNTER — Other Ambulatory Visit (HOSPITAL_BASED_OUTPATIENT_CLINIC_OR_DEPARTMENT_OTHER): Payer: Self-pay

## 2023-05-04 MED ORDER — HYDROCODONE-ACETAMINOPHEN 10-325 MG PO TABS
1.0000 | ORAL_TABLET | Freq: Four times a day (QID) | ORAL | 0 refills | Status: DC | PRN
  Filled 2023-05-04: qty 30, 8d supply, fill #0

## 2023-05-10 ENCOUNTER — Telehealth: Payer: Self-pay

## 2023-05-10 ENCOUNTER — Other Ambulatory Visit: Payer: Self-pay | Admitting: Physician Assistant

## 2023-05-10 ENCOUNTER — Encounter: Payer: Self-pay | Admitting: Hematology and Oncology

## 2023-05-10 ENCOUNTER — Other Ambulatory Visit: Payer: Self-pay | Admitting: Oncology

## 2023-05-10 ENCOUNTER — Other Ambulatory Visit (HOSPITAL_BASED_OUTPATIENT_CLINIC_OR_DEPARTMENT_OTHER): Payer: Self-pay

## 2023-05-10 DIAGNOSIS — Z9884 Bariatric surgery status: Secondary | ICD-10-CM | POA: Diagnosis not present

## 2023-05-10 DIAGNOSIS — E162 Hypoglycemia, unspecified: Secondary | ICD-10-CM | POA: Diagnosis not present

## 2023-05-10 DIAGNOSIS — D508 Other iron deficiency anemias: Secondary | ICD-10-CM

## 2023-05-10 DIAGNOSIS — Z713 Dietary counseling and surveillance: Secondary | ICD-10-CM | POA: Diagnosis not present

## 2023-05-10 DIAGNOSIS — E669 Obesity, unspecified: Secondary | ICD-10-CM | POA: Diagnosis not present

## 2023-05-10 LAB — HEMOGLOBIN A1C: Hemoglobin A1C: 5.4

## 2023-05-10 MED ORDER — FREESTYLE LIBRE 3 SENSOR MISC
5 refills | Status: AC
  Filled 2023-05-10: qty 2, 28d supply, fill #0
  Filled 2023-07-02: qty 2, 28d supply, fill #1
  Filled 2023-07-28: qty 2, 28d supply, fill #2
  Filled 2023-08-28: qty 2, 28d supply, fill #3
  Filled 2023-11-10: qty 2, 28d supply, fill #4
  Filled 2023-12-22: qty 2, 28d supply, fill #5

## 2023-05-10 NOTE — Progress Notes (Unsigned)
 [facility]  [address] [place],  [place]  [address] [phone]  Clinic Day:  05/11/2023  Referring physician: Marianne Sofia, PA-C   HISTORY OF PRESENT ILLNESS:  Michelle Lamb is a 33 y.o. female with iron deficiency anemia secondary to both previously heavy menstrual cycles and a gastric bypass surgery.  In the past, IV iron was discussed with and replenishing her iron stores and normalizing.  She comes in today for routine follow-up.  Over these past few weeks, the patient has noticed herself being more fatigued.  Since her last visit, the patient did undergo a hysterectomy in late 2024.  Since then, she denies having any overt forms of blood loss.  VITALS:  Blood pressure (!) 146/94, pulse 81, temperature 98.2 F (36.8 C), temperature source Oral, resp. rate 16, height 5\' 3"  (1.6 m), weight 187 lb 6.4 oz (85 kg), last menstrual period 12/04/2022, SpO2 97%.  Wt Readings from Last 3 Encounters:  05/11/23 187 lb 6.4 oz (85 kg)  05/01/23 186 lb (84.4 kg)  04/24/23 186 lb (84.4 kg)    Body mass index is 33.2 kg/m.  Performance status (ECOG): 1 - Symptomatic but completely ambulatory  PHYSICAL EXAM:  Physical Exam Vitals and nursing note reviewed.  Constitutional:      General: She is not in acute distress.    Appearance: Normal appearance.  HENT:     Head: Normocephalic and atraumatic.     Mouth/Throat:     Mouth: Mucous membranes are moist.     Pharynx: Oropharynx is clear. No oropharyngeal exudate or posterior oropharyngeal erythema.  Eyes:     General: No scleral icterus.    Extraocular Movements: Extraocular movements intact.     Conjunctiva/sclera: Conjunctivae normal.     Pupils: Pupils are equal, round, and reactive to light.  Cardiovascular:     Rate and Rhythm: Normal rate and regular rhythm.     Heart sounds: Normal heart sounds. No murmur heard.    No friction rub. No gallop.  Pulmonary:     Effort: Pulmonary effort  is normal.     Breath sounds: Normal breath sounds. No wheezing, rhonchi or rales.  Abdominal:     General: There is no distension.     Palpations: Abdomen is soft. There is no hepatomegaly, splenomegaly or mass.     Tenderness: There is no abdominal tenderness.  Musculoskeletal:        General: Normal range of motion.     Cervical back: Normal range of motion and neck supple. No tenderness.     Right lower leg: No edema.     Left lower leg: No edema.  Lymphadenopathy:     Cervical: No cervical adenopathy.     Upper Body:     Right upper body: No supraclavicular or axillary adenopathy.     Left upper body: No supraclavicular or axillary adenopathy.     Lower Body: No right inguinal adenopathy. No left inguinal adenopathy.  Skin:    General: Skin is warm and dry.     Coloration: Skin is not jaundiced.     Findings: No rash.  Neurological:     Mental Status: She is alert and oriented to person, place, and time.     Cranial Nerves: No cranial nerve deficit.  Psychiatric:        Mood and Affect: Mood normal.        Behavior: Behavior normal.        Thought Content:  Thought content normal.   LABS:      Latest Ref Rng & Units 05/01/2023   12:03 PM 03/28/2023    1:17 PM 12/27/2022    6:21 AM  CBC  WBC 3.4 - 10.8 x10E3/uL 11.7  10.5    Hemoglobin 11.1 - 15.9 g/dL 16.1  09.6  04.5   Hematocrit 34.0 - 46.6 % 36.8  39.0  35.0   Platelets 150 - 450 x10E3/uL 385  355        Latest Ref Rng & Units 05/01/2023   12:03 PM 03/28/2023    1:17 PM 12/27/2022    6:21 AM  CMP  Glucose 70 - 99 mg/dL 74  80  75   BUN 6 - 20 mg/dL 12  15  16    Creatinine 0.57 - 1.00 mg/dL 4.09  8.11  9.14   Sodium 134 - 144 mmol/L 137  139  139   Potassium 3.5 - 5.2 mmol/L 4.5  4.3  3.9   Chloride 96 - 106 mmol/L 100  103  101   CO2 20 - 29 mmol/L 24  21    Calcium 8.7 - 10.2 mg/dL 9.2  9.3    Total Protein 6.0 - 8.5 g/dL 6.9  6.8    Total Bilirubin 0.0 - 1.2 mg/dL 0.5  0.6    Alkaline Phos 44 - 121 IU/L 91   99    AST 0 - 40 IU/L 28  40    ALT 0 - 32 IU/L 28  58      Latest Reference Range & Units 05/01/23 12:03  Iron 27 - 159 ug/dL 37  UIBC 782 - 956 ug/dL 213  TIBC 086 - 578 ug/dL 469  Ferritin 15 - 629 ng/mL 12 (L)  Iron Saturation 15 - 55 % 9 (LL)  (LL): Data is critically low (L): Data is abnormally low  ASSESSMENT & PLAN:  Assessment/Plan: A 33 y.o. female with iron deficiency anemia secondary to previously heavy menstrual cycles and a gastric bypass surgery.  Based upon her recent labs showing iron deficiency anemia, I will arrange for her to receive IV iron over these next few weeks to rapidly replenish her iron stores and lead to an improvement in her hemoglobin.  I will see her back in 3 months for repeat clinical assessment.  The patient understands all the plans discussed today and is in agreement with them.  Laparis Durrett Kirby Funk, MD

## 2023-05-10 NOTE — Telephone Encounter (Signed)
 PA submitted and approved via covermymeds for freestyle libre sensors.

## 2023-05-11 ENCOUNTER — Other Ambulatory Visit (HOSPITAL_BASED_OUTPATIENT_CLINIC_OR_DEPARTMENT_OTHER): Payer: Self-pay

## 2023-05-11 ENCOUNTER — Other Ambulatory Visit: Payer: Self-pay | Admitting: Oncology

## 2023-05-11 ENCOUNTER — Inpatient Hospital Stay: Attending: Oncology | Admitting: Oncology

## 2023-05-11 VITALS — BP 146/94 | HR 81 | Temp 98.2°F | Resp 16 | Ht 63.0 in | Wt 187.4 lb

## 2023-05-11 DIAGNOSIS — D508 Other iron deficiency anemias: Secondary | ICD-10-CM | POA: Diagnosis not present

## 2023-05-11 DIAGNOSIS — N920 Excessive and frequent menstruation with regular cycle: Secondary | ICD-10-CM | POA: Insufficient documentation

## 2023-05-11 DIAGNOSIS — Z9884 Bariatric surgery status: Secondary | ICD-10-CM | POA: Diagnosis not present

## 2023-05-11 DIAGNOSIS — D500 Iron deficiency anemia secondary to blood loss (chronic): Secondary | ICD-10-CM | POA: Diagnosis not present

## 2023-05-12 ENCOUNTER — Encounter: Payer: Self-pay | Admitting: Oncology

## 2023-05-12 ENCOUNTER — Encounter: Payer: Self-pay | Admitting: Hematology and Oncology

## 2023-05-12 ENCOUNTER — Telehealth: Payer: Self-pay | Admitting: Oncology

## 2023-05-12 NOTE — Telephone Encounter (Signed)
 Contacted pt to schedule an appt. Unable to reach via phone, voicemail was left.     Follow-Up Information  Follow-up disposition: Return in about 3 months (around 08/11/2023).  Check out comments: Iv iron asap

## 2023-05-12 NOTE — Telephone Encounter (Signed)
 Patient has been scheduled. Aware of appt date and time.

## 2023-05-17 ENCOUNTER — Inpatient Hospital Stay

## 2023-05-17 VITALS — BP 145/89 | HR 85 | Temp 97.7°F | Resp 18

## 2023-05-17 DIAGNOSIS — D500 Iron deficiency anemia secondary to blood loss (chronic): Secondary | ICD-10-CM

## 2023-05-17 DIAGNOSIS — Z9884 Bariatric surgery status: Secondary | ICD-10-CM | POA: Diagnosis not present

## 2023-05-17 DIAGNOSIS — N920 Excessive and frequent menstruation with regular cycle: Secondary | ICD-10-CM | POA: Diagnosis not present

## 2023-05-17 DIAGNOSIS — D508 Other iron deficiency anemias: Secondary | ICD-10-CM | POA: Diagnosis not present

## 2023-05-17 MED ORDER — IRON SUCROSE 20 MG/ML IV SOLN
200.0000 mg | Freq: Once | INTRAVENOUS | Status: AC
  Administered 2023-05-17: 200 mg via INTRAVENOUS
  Filled 2023-05-17: qty 10

## 2023-05-17 NOTE — Patient Instructions (Signed)

## 2023-05-18 ENCOUNTER — Ambulatory Visit

## 2023-05-18 DIAGNOSIS — R350 Frequency of micturition: Secondary | ICD-10-CM

## 2023-05-18 LAB — POCT URINALYSIS DIP (CLINITEK)
Blood, UA: NEGATIVE
Glucose, UA: NEGATIVE mg/dL
Ketones, POC UA: NEGATIVE mg/dL
Leukocytes, UA: NEGATIVE
Nitrite, UA: NEGATIVE
Spec Grav, UA: 1.02 (ref 1.010–1.025)
Urobilinogen, UA: 1 U/dL
pH, UA: 6.5 (ref 5.0–8.0)

## 2023-05-19 ENCOUNTER — Inpatient Hospital Stay

## 2023-05-19 VITALS — BP 149/77 | HR 80 | Temp 98.0°F | Resp 18

## 2023-05-19 DIAGNOSIS — Z9884 Bariatric surgery status: Secondary | ICD-10-CM | POA: Diagnosis not present

## 2023-05-19 DIAGNOSIS — D508 Other iron deficiency anemias: Secondary | ICD-10-CM | POA: Diagnosis not present

## 2023-05-19 DIAGNOSIS — D500 Iron deficiency anemia secondary to blood loss (chronic): Secondary | ICD-10-CM

## 2023-05-19 DIAGNOSIS — N920 Excessive and frequent menstruation with regular cycle: Secondary | ICD-10-CM | POA: Diagnosis not present

## 2023-05-19 MED ORDER — SODIUM CHLORIDE 0.9 % IV SOLN: INTRAVENOUS | Status: DC

## 2023-05-19 MED ORDER — SODIUM CHLORIDE 0.9% FLUSH
10.0000 mL | Freq: Once | INTRAVENOUS | Status: AC | PRN
Start: 2023-05-19 — End: 2023-05-19
  Administered 2023-05-19: 10 mL

## 2023-05-19 MED ORDER — IRON SUCROSE 20 MG/ML IV SOLN
200.0000 mg | Freq: Once | INTRAVENOUS | Status: AC
  Administered 2023-05-19: 200 mg via INTRAVENOUS
  Filled 2023-05-19: qty 10

## 2023-05-19 NOTE — Patient Instructions (Signed)

## 2023-05-24 ENCOUNTER — Inpatient Hospital Stay

## 2023-05-24 VITALS — BP 150/93 | HR 91 | Resp 18

## 2023-05-24 DIAGNOSIS — Z9884 Bariatric surgery status: Secondary | ICD-10-CM | POA: Diagnosis not present

## 2023-05-24 DIAGNOSIS — N920 Excessive and frequent menstruation with regular cycle: Secondary | ICD-10-CM | POA: Diagnosis not present

## 2023-05-24 DIAGNOSIS — D500 Iron deficiency anemia secondary to blood loss (chronic): Secondary | ICD-10-CM

## 2023-05-24 DIAGNOSIS — D508 Other iron deficiency anemias: Secondary | ICD-10-CM | POA: Diagnosis not present

## 2023-05-24 MED ORDER — IRON SUCROSE 20 MG/ML IV SOLN
200.0000 mg | Freq: Once | INTRAVENOUS | Status: AC
Start: 2023-05-24 — End: 2023-05-24
  Administered 2023-05-24: 200 mg via INTRAVENOUS
  Filled 2023-05-24: qty 10

## 2023-05-24 MED ORDER — SODIUM CHLORIDE 0.9 % IV SOLN: INTRAVENOUS | Status: DC

## 2023-05-24 NOTE — Patient Instructions (Signed)

## 2023-05-26 ENCOUNTER — Inpatient Hospital Stay

## 2023-05-26 VITALS — BP 157/99 | HR 74 | Temp 98.0°F | Resp 16 | Ht 63.0 in | Wt 191.0 lb

## 2023-05-26 DIAGNOSIS — D508 Other iron deficiency anemias: Secondary | ICD-10-CM | POA: Diagnosis not present

## 2023-05-26 DIAGNOSIS — D500 Iron deficiency anemia secondary to blood loss (chronic): Secondary | ICD-10-CM

## 2023-05-26 DIAGNOSIS — N920 Excessive and frequent menstruation with regular cycle: Secondary | ICD-10-CM | POA: Diagnosis not present

## 2023-05-26 DIAGNOSIS — Z9884 Bariatric surgery status: Secondary | ICD-10-CM | POA: Diagnosis not present

## 2023-05-26 MED ORDER — IRON SUCROSE 20 MG/ML IV SOLN
200.0000 mg | Freq: Once | INTRAVENOUS | Status: AC
  Administered 2023-05-26: 200 mg via INTRAVENOUS
  Filled 2023-05-26: qty 10

## 2023-05-26 MED ORDER — SODIUM CHLORIDE 0.9 % IV SOLN: INTRAVENOUS | Status: DC

## 2023-05-26 MED ORDER — SODIUM CHLORIDE 0.9% FLUSH
10.0000 mL | Freq: Once | INTRAVENOUS | Status: AC | PRN
  Administered 2023-05-26: 10 mL

## 2023-05-26 NOTE — Progress Notes (Signed)
 Patient states understanding to follow up with PCP regarding BP control. Patient declines post iron infusion observation. Discharge home stable and ambulatory

## 2023-05-26 NOTE — Patient Instructions (Signed)

## 2023-05-29 ENCOUNTER — Other Ambulatory Visit: Payer: Self-pay

## 2023-05-29 ENCOUNTER — Other Ambulatory Visit (HOSPITAL_BASED_OUTPATIENT_CLINIC_OR_DEPARTMENT_OTHER): Payer: Self-pay

## 2023-05-29 ENCOUNTER — Other Ambulatory Visit: Payer: Self-pay | Admitting: Physician Assistant

## 2023-05-29 ENCOUNTER — Ambulatory Visit (INDEPENDENT_AMBULATORY_CARE_PROVIDER_SITE_OTHER): Admitting: Physician Assistant

## 2023-05-29 ENCOUNTER — Encounter: Payer: Self-pay | Admitting: Physician Assistant

## 2023-05-29 ENCOUNTER — Encounter: Payer: Self-pay | Admitting: Oncology

## 2023-05-29 VITALS — BP 122/68 | HR 94 | Temp 97.6°F | Resp 16 | Ht 63.0 in | Wt 187.0 lb

## 2023-05-29 DIAGNOSIS — M545 Low back pain, unspecified: Secondary | ICD-10-CM | POA: Insufficient documentation

## 2023-05-29 DIAGNOSIS — D508 Other iron deficiency anemias: Secondary | ICD-10-CM

## 2023-05-29 DIAGNOSIS — F418 Other specified anxiety disorders: Secondary | ICD-10-CM | POA: Diagnosis not present

## 2023-05-29 DIAGNOSIS — G8929 Other chronic pain: Secondary | ICD-10-CM | POA: Diagnosis not present

## 2023-05-29 DIAGNOSIS — R41840 Attention and concentration deficit: Secondary | ICD-10-CM | POA: Diagnosis not present

## 2023-05-29 DIAGNOSIS — E162 Hypoglycemia, unspecified: Secondary | ICD-10-CM | POA: Insufficient documentation

## 2023-05-29 DIAGNOSIS — D500 Iron deficiency anemia secondary to blood loss (chronic): Secondary | ICD-10-CM

## 2023-05-29 LAB — POCT URINALYSIS DIP (CLINITEK)
Blood, UA: NEGATIVE
Glucose, UA: NEGATIVE mg/dL
Ketones, POC UA: NEGATIVE mg/dL
Leukocytes, UA: NEGATIVE
Nitrite, UA: NEGATIVE
Spec Grav, UA: 1.025 (ref 1.010–1.025)
Urobilinogen, UA: 1 U/dL
pH, UA: 6 (ref 5.0–8.0)

## 2023-05-29 MED ORDER — LISDEXAMFETAMINE DIMESYLATE 70 MG PO CAPS
70.0000 mg | ORAL_CAPSULE | Freq: Every day | ORAL | 0 refills | Status: DC
  Filled 2023-05-29: qty 30, 30d supply, fill #0

## 2023-05-29 NOTE — Progress Notes (Signed)
 Subjective:  Patient ID: Michelle Lamb, female    DOB: 15-May-1990  Age: 33 y.o. MRN: [id number]  Chief Complaint  Patient presents with   Medical Management of Chronic Issues    HPI Pt in today for follow up - she was diagnosed with iron def anemia and had been referred back to hematology- she has had 4 infusions and will receive one more this week - states overall feeling better Will be due to repeat labwork in one month  Pt states her depression improved since increasing wellbutrin to 150mg  qd - she is having some breakthrough symptoms of irritability -feels like her Vyvanse is working but not as well as it used to - would like to try increased dose  Pt has chronic low back pain - had been going to physical therapy which has helped some of the tightness in her back but would prefer to do home exercises at this point She does have back pain also from chronic kidney issues - no urine symptoms today  Pt has seen bariatric doctor about her hypoglycemic episodes - has been advised by nutritionist to do 3 small meals through day with 2 snacks - to include protein/starchy vegetables and avoid soft drinks     05/29/2023    1:23 PM 10/19/2022    4:23 PM 08/22/2022    3:31 PM 11/30/2021   11:14 AM 09/08/2021    9:57 AM  Depression screen PHQ 2/9  Decreased Interest 2 1 1  0 0  Down, Depressed, Hopeless 2 1 2 1  0  PHQ - 2 Score 4 2 3 1  0  Altered sleeping 2 2 3  0 0  Tired, decreased energy 2 1 1 2  0  Change in appetite 0 1  0 0  Feeling bad or failure about yourself  1  0 0 0  Trouble concentrating 3 0 3 3 0  Moving slowly or fidgety/restless 0 0 0 0 0  Suicidal thoughts 0 0 0 0 0  PHQ-9 Score 12 6 10 6  0  Difficult doing work/chores Very difficult Somewhat difficult  Somewhat difficult Not difficult at all        09/24/2021    2:00 PM 11/11/2021    9:15 AM 11/14/2022   10:55 AM 12/14/2022   12:06 PM 05/29/2023    1:23 PM  Fall Risk  Falls in the past year?   0 0 0  Was there  an injury with Fall?   0 0 0  Fall Risk Category Calculator   0 0 0  (RETIRED) Patient Fall Risk Level Low fall risk Low fall risk     Patient at Risk for Falls Due to   No Fall Risks No Fall Risks No Fall Risks  Fall risk Follow up   Falls evaluation completed Falls evaluation completed Falls evaluation completed     ROS CONSTITUTIONAL: Negative for chills, fatigue, fever,  E/N/T: Negative for ear pain, nasal congestion and sore throat.  CARDIOVASCULAR: Negative for chest pain,  RESPIRATORY: Negative for recent cough and dyspnea.   MSK: see HPI INTEGUMENTARY: Negative for rash.    PSYCHIATRIC: see HPI   Current Outpatient Medications:    lisdexamfetamine (VYVANSE) 70 MG capsule, Take 1 capsule (70 mg total) by mouth daily., Disp: 30 capsule, Rfl: 0   albuterol (VENTOLIN HFA) 108 (90 Base) MCG/ACT inhaler, Inhale 2 puffs into the lungs every 6 (six) hours as needed for wheezing or shortness of breath., Disp: 1 each, Rfl: 5   Continuous  Glucose Sensor (FREESTYLE LIBRE 3 SENSOR) MISC, Use every 14 days - apply as directed, Disp: 2 each, Rfl: 5   cyanocobalamin (VITAMIN B12) 1000 MCG/ML injection, Inject 1 ML as directed once a month, Disp: 1 mL, Rfl: 2   ferrous sulfate 325 (65 FE) MG EC tablet, Take 325 mg by mouth daily with breakfast., Disp: , Rfl:    fluticasone (FLONASE) 50 MCG/ACT nasal spray, Place 2 sprays into both nostrils daily. (Patient taking differently: Place 2 sprays into both nostrils as needed for allergies or rhinitis.), Disp: 16 g, Rfl: 6   HYDROcodone-acetaminophen (NORCO) 10-325 MG tablet, Take 1 tablet by mouth every 6 (six) hours as needed., Disp: 30 tablet, Rfl: 0   linaclotide (LINZESS) 145 MCG CAPS capsule, Take 145 mcg by mouth daily before breakfast., Disp: , Rfl:    LORazepam (ATIVAN) 1 MG tablet, Take 1 tablet (1 mg total) by mouth daily as needed., Disp: 30 tablet, Rfl: 1   meclizine (ANTIVERT) 25 MG tablet, Take 1 tablet (25 mg total) by mouth 3 (three)  times daily as needed for dizziness., Disp: 30 tablet, Rfl: 0   Multiple Vitamin (MULTIVITAMIN WITH MINERALS) TABS tablet, Take 1 tablet by mouth daily., Disp: , Rfl:    nitrofurantoin, macrocrystal-monohydrate, (MACROBID) 100 MG capsule, Take 1 capsule (100 mg total) by mouth 2 (two) times daily., Disp: 20 capsule, Rfl: 0   ondansetron (ZOFRAN-ODT) 4 MG disintegrating tablet, Take 1 tablet (4 mg total) by mouth every 8 (eight) hours as needed for nausea or vomiting., Disp: 90 tablet, Rfl: 0   pantoprazole (PROTONIX) 40 MG tablet, Take 1 tablet (40 mg total) by mouth daily., Disp: 30 tablet, Rfl: 11   venlafaxine XR (EFFEXOR XR) 150 MG 24 hr capsule, Take 1 capsule (150 mg total) by mouth daily with breakfast., Disp: 30 capsule, Rfl: 2   Vitamin D, Ergocalciferol, (DRISDOL) 1.25 MG (50000 UNIT) CAPS capsule, Twice weekly, Disp: , Rfl:   Past Medical History:  Diagnosis Date   Abnormal menses 08/22/2022   Absolute anemia 10/12/2021   Acute cystitis with hematuria 12/22/2020   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   Blood transfusion without reported diagnosis 2018   postpartum   Chest pain of uncertain etiology 06/08/2022   06/15/22 Myocardial perfusion - normal, 09/15/22 long term heart monitor in Epic   Chronic kidney disease    right kidney at 35%   Complication of anesthesia    Slow to wake up   Constipation 06/28/2022   Diabetes mellitus without complication (HCC)    NOT AN ISSUE SINCE GASTRIC BYPASS IN 2022. TYPE 2 LAST DOSE FRIDAY December 24, 2020 (METFORMIN)   Dizziness 08/22/2022   Dizziness 2024   Follows w/ PCP and cardiology.   Gastroesophageal reflux disease without esophagitis 09/08/2021   Genetic testing 03/03/2022   Pathogenic variant in POT1 gene  at  5'UTR_EX1del.  Report date is 02/23/2022.      The CancerNext-Expanded gene panel offered by [facility] and includes sequencing, rearrangement, and RNA analysis for the following 77 genes: AIP, ALK, APC, ATM, AXIN2, BAP1, BARD1, BLM, BMPR1A, BRCA1, BRCA2, BRIP1, CDC73, CDH1, CDK4, CDKN1B, CDKN2A, CHEK2, CTNNA1, DICER1, FANCC, FH, FLCN, GALNT12, KIF1B, LZTR1,   Heart murmur    as a baby   Hepatic  steatosis 09/08/2021   History of herpes simplex infection    History of partial nephrectomy 03/21/2022   Formatting of this note might be different from the original. 03/15/2021: Underwent right partial nephrectomy by Dr. Laverle Patter for right renal neoplasm. Pathology: Angiomyolipoma. Formatting of this note might be different from the original. 03/15/2021: Underwent right partial nephrectomy by Dr. Laverle Patter for right renal neoplasm. Pathology: Angiomyolipoma.   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   Morbid obesity (HCC) 06/02/2020   Neoplasm of right kidney    benign tumor of kidney   Obesity (BMI 30.0-34.9) 06/08/2022   Obstructive sleep apnea 10/29/2019   hx of before gastric bypass, no longer uses CPAP   Racing heart beat 08/22/2022   see 09/15/22 Long term heart monitor in Epic   Seasonal allergies    Ureteral obstruction, right 07/15/2021   Vaginal delivery 2009, 2010, 2015   Vitamin D insufficiency 09/30/2020   Weight loss 09/30/2020   Objective:  PHYSICAL EXAM:   VS: BP 122/68   Pulse 94   Temp 97.6 F (36.4 C) (Temporal)   Resp 16   Ht 5\' 3"  (1.6 m)   Wt 187 lb (84.8 kg)   LMP 12/04/2022 (Exact Date)   SpO2 100%   BMI 33.13 kg/m    GEN: Well nourished, well developed, in no acute distress  Cardiac: RRR; no murmurs, rubs, or gallops,no edema - Respiratory:  normal respiratory rate and pattern with no distress - normal breath sounds with no rales, rhonchi, wheezes or rubs GI: normal bowel sounds, no masses or tenderness MS: no deformity or atrophy  Skin: warm and dry, no rash   Psych: euthymic mood, appropriate affect and demeanor  Office Visit on 05/29/2023  Component Date Value Ref Range Status   Color, UA 05/29/2023 yellow  yellow Final   Clarity, UA 05/29/2023 clear  clear Final   Glucose, UA 05/29/2023 negative  negative mg/dL Final   Bilirubin, UA 16/11/9602 small (A)  negative Final   Ketones, POC UA 05/29/2023 negative  negative mg/dL Final   Spec Grav, UA 54/10/8117 1.025  1.010 - 1.025 Final   Blood, UA 05/29/2023 negative  negative Final   pH, UA 05/29/2023 6.0  5.0 - 8.0 Final   POC PROTEIN,UA 05/29/2023 trace  negative, trace Final   Urobilinogen, UA 05/29/2023 1.0  0.2 or 1.0 E.U./dL Final   Nitrite, UA 14/78/2956 Negative  Negative Final   Leukocytes, UA 05/29/2023 Negative  Negative Final    Assessment & Plan:    Other iron deficiency anemia Continue follow up with hematology Repeat labwork 1 month Chronic bilateral low back pain without sciatica -     POCT URINALYSIS DIP (CLINITEK) Home exercises Attention or concentration deficit -     Lisdexamfetamine Dimesylate; Take 1 capsule (70 mg total) by mouth daily.  Dispense: 30 capsule; Refill: 0  Hypoglycemia Continue with diet modifications Anxiety with depression Continue wellbutrin as directed    Follow-up: Return in about 4 months (around 09/28/2023) for fasting physical - 20 min  - 1 month repeat labwork.  An After Visit Summary was printed and given to the  patient.  Jettie Pagan [facility] [phone]

## 2023-05-30 ENCOUNTER — Inpatient Hospital Stay

## 2023-05-30 ENCOUNTER — Telehealth: Payer: Self-pay | Admitting: Oncology

## 2023-05-30 ENCOUNTER — Other Ambulatory Visit (HOSPITAL_BASED_OUTPATIENT_CLINIC_OR_DEPARTMENT_OTHER): Payer: Self-pay

## 2023-05-30 NOTE — Telephone Encounter (Signed)
 05/30/23 Spoke with patient and rescheduled next appts.

## 2023-05-31 ENCOUNTER — Inpatient Hospital Stay: Attending: Oncology

## 2023-05-31 VITALS — BP 142/97 | HR 89 | Temp 97.7°F | Resp 16

## 2023-05-31 DIAGNOSIS — D500 Iron deficiency anemia secondary to blood loss (chronic): Secondary | ICD-10-CM

## 2023-05-31 DIAGNOSIS — D508 Other iron deficiency anemias: Secondary | ICD-10-CM | POA: Insufficient documentation

## 2023-05-31 DIAGNOSIS — Z9884 Bariatric surgery status: Secondary | ICD-10-CM | POA: Insufficient documentation

## 2023-05-31 DIAGNOSIS — N920 Excessive and frequent menstruation with regular cycle: Secondary | ICD-10-CM | POA: Diagnosis not present

## 2023-05-31 MED ORDER — IRON SUCROSE 20 MG/ML IV SOLN
200.0000 mg | Freq: Once | INTRAVENOUS | Status: AC
Start: 2023-05-31 — End: 2023-05-31
  Administered 2023-05-31: 200 mg via INTRAVENOUS
  Filled 2023-05-31: qty 10

## 2023-05-31 MED ORDER — SODIUM CHLORIDE 0.9% FLUSH: 10.0000 mL | Freq: Once | INTRAVENOUS | Status: DC | PRN

## 2023-05-31 NOTE — Patient Instructions (Signed)

## 2023-05-31 NOTE — Progress Notes (Signed)
 Patient declines 30 minute post iron observation

## 2023-06-01 ENCOUNTER — Encounter: Payer: Self-pay | Admitting: Physician Assistant

## 2023-06-01 ENCOUNTER — Ambulatory Visit (HOSPITAL_BASED_OUTPATIENT_CLINIC_OR_DEPARTMENT_OTHER)
Admission: RE | Admit: 2023-06-01 | Discharge: 2023-06-01 | Disposition: A | Discharge status: Home / Self Care | Source: Ambulatory Visit | Attending: Physician Assistant | Admitting: Physician Assistant

## 2023-06-01 ENCOUNTER — Other Ambulatory Visit: Payer: Self-pay | Admitting: Physician Assistant

## 2023-06-01 ENCOUNTER — Other Ambulatory Visit: Payer: Self-pay | Admitting: Family Medicine

## 2023-06-01 DIAGNOSIS — M545 Low back pain, unspecified: Secondary | ICD-10-CM | POA: Diagnosis not present

## 2023-06-01 DIAGNOSIS — M438X6 Other specified deforming dorsopathies, lumbar region: Secondary | ICD-10-CM | POA: Diagnosis not present

## 2023-06-02 ENCOUNTER — Other Ambulatory Visit (HOSPITAL_BASED_OUTPATIENT_CLINIC_OR_DEPARTMENT_OTHER): Payer: Self-pay

## 2023-06-02 ENCOUNTER — Other Ambulatory Visit: Payer: Self-pay | Admitting: Physician Assistant

## 2023-06-02 MED ORDER — HYDROCODONE-ACETAMINOPHEN 10-325 MG PO TABS
1.0000 | ORAL_TABLET | Freq: Two times a day (BID) | ORAL | 0 refills | Status: DC
  Filled 2023-06-02: qty 15, 8d supply, fill #0

## 2023-06-07 ENCOUNTER — Other Ambulatory Visit: Payer: Self-pay | Admitting: Family Medicine

## 2023-06-07 ENCOUNTER — Other Ambulatory Visit (HOSPITAL_BASED_OUTPATIENT_CLINIC_OR_DEPARTMENT_OTHER): Payer: Self-pay

## 2023-06-07 DIAGNOSIS — M545 Low back pain, unspecified: Secondary | ICD-10-CM

## 2023-06-07 MED ORDER — HYDROCODONE-ACETAMINOPHEN 10-325 MG PO TABS
2.0000 | ORAL_TABLET | Freq: Two times a day (BID) | ORAL | 0 refills | Status: AC
  Filled 2023-06-07 – 2023-06-09 (×3): qty 20, 5d supply, fill #0

## 2023-06-09 ENCOUNTER — Other Ambulatory Visit (HOSPITAL_BASED_OUTPATIENT_CLINIC_OR_DEPARTMENT_OTHER): Payer: Self-pay

## 2023-06-12 ENCOUNTER — Other Ambulatory Visit (HOSPITAL_BASED_OUTPATIENT_CLINIC_OR_DEPARTMENT_OTHER): Payer: Self-pay

## 2023-06-12 ENCOUNTER — Other Ambulatory Visit: Payer: Self-pay | Admitting: Physician Assistant

## 2023-06-12 DIAGNOSIS — D500 Iron deficiency anemia secondary to blood loss (chronic): Secondary | ICD-10-CM | POA: Diagnosis not present

## 2023-06-12 DIAGNOSIS — Z9884 Bariatric surgery status: Secondary | ICD-10-CM | POA: Diagnosis not present

## 2023-06-12 DIAGNOSIS — R1319 Other dysphagia: Secondary | ICD-10-CM | POA: Diagnosis not present

## 2023-06-12 DIAGNOSIS — K648 Other hemorrhoids: Secondary | ICD-10-CM | POA: Diagnosis not present

## 2023-06-12 DIAGNOSIS — K5904 Chronic idiopathic constipation: Secondary | ICD-10-CM | POA: Diagnosis not present

## 2023-06-12 DIAGNOSIS — K219 Gastro-esophageal reflux disease without esophagitis: Secondary | ICD-10-CM | POA: Diagnosis not present

## 2023-06-12 MED ORDER — AZITHROMYCIN 250 MG PO TABS
ORAL_TABLET | ORAL | 0 refills | Status: AC
  Filled 2023-06-12: qty 6, 5d supply, fill #0

## 2023-06-14 ENCOUNTER — Encounter: Payer: Self-pay | Admitting: Plastic Surgery

## 2023-06-14 ENCOUNTER — Ambulatory Visit: Admitting: Plastic Surgery

## 2023-06-14 VITALS — BP 153/93 | HR 91 | Ht 63.0 in | Wt 190.0 lb

## 2023-06-14 DIAGNOSIS — R21 Rash and other nonspecific skin eruption: Secondary | ICD-10-CM

## 2023-06-14 DIAGNOSIS — M793 Panniculitis, unspecified: Secondary | ICD-10-CM

## 2023-06-14 DIAGNOSIS — Z9884 Bariatric surgery status: Secondary | ICD-10-CM

## 2023-06-14 NOTE — Progress Notes (Signed)
 Referring Provider Cyndi Drain, PA-C [address] [place],  [place] [address]   CC:  Chief Complaint  Patient presents with   Advice Only   Skin Problem      Michelle Lamb is an 33 y.o. female.  HPI: Michelle Lamb is a 32 year old female who presents today with complaints of excess skin and fat on her anterior abdominal wall.  She has 2 specific concerns regarding her pannus 1 is that she has low back pain which is worsened with bending over.  The second is that she has rashes on the posterior aspect of her pannus and in the intertriginous regions.  She underwent gastric bypass in 2022 and has lost 110 pounds since that time.  The excess skin has become more problematic since her weight loss.  She has tried multiple rash creams with limited success and recurrence after stopping each one.   Allergies  Allergen Reactions   Oxy Ir [Oxycodone] Itching   Dilaudid [Hydromorphone] Rash    Outpatient Encounter Medications as of 06/14/2023  Medication Sig   albuterol (VENTOLIN HFA) 108 (90 Base) MCG/ACT inhaler Inhale 2 puffs into the lungs every 6 (six) hours as needed for wheezing or shortness of breath.   azithromycin (ZITHROMAX) 250 MG tablet Take 2 tablets (500 mg total) by mouth daily for 1 day, THEN 1 tablet (250 mg total) daily for 4 days.   Continuous Glucose Sensor (FREESTYLE LIBRE 3 SENSOR) MISC Use every 14 days - apply as directed   cyanocobalamin (VITAMIN B12) 1000 MCG/ML injection Inject 1 ML as directed once a month   ferrous sulfate 325 (65 FE) MG EC tablet Take 325 mg by mouth daily with breakfast.   fluticasone (FLONASE) 50 MCG/ACT nasal spray Place 2 sprays into both nostrils daily. (Patient taking differently: Place 2 sprays into both nostrils as needed for allergies or rhinitis.)   HYDROcodone-acetaminophen (NORCO) 10-325 MG tablet Take 2 tablets by mouth every 12 (twelve) hours for 5 days.   linaclotide (LINZESS) 145 MCG CAPS capsule Take 145 mcg by mouth daily  before breakfast.   lisdexamfetamine (VYVANSE) 70 MG capsule Take 1 capsule (70 mg total) by mouth daily.   LORazepam (ATIVAN) 1 MG tablet Take 1 tablet (1 mg total) by mouth daily as needed.   meclizine (ANTIVERT) 25 MG tablet Take 1 tablet (25 mg total) by mouth 3 (three) times daily as needed for dizziness.   Multiple Vitamin (MULTIVITAMIN WITH MINERALS) TABS tablet Take 1 tablet by mouth daily.   ondansetron (ZOFRAN-ODT) 4 MG disintegrating tablet Take 1 tablet (4 mg total) by mouth every 8 (eight) hours as needed for nausea or vomiting.   pantoprazole (PROTONIX) 40 MG tablet Take 1 tablet (40 mg total) by mouth daily.   venlafaxine XR (EFFEXOR XR) 150 MG 24 hr capsule Take 1 capsule (150 mg total) by mouth daily with breakfast.   Vitamin D, Ergocalciferol, (DRISDOL) 1.25 MG (50000 UNIT) CAPS capsule Twice weekly   [DISCONTINUED] nitrofurantoin, macrocrystal-monohydrate, (MACROBID) 100 MG capsule Take 1 capsule (100 mg total) by mouth 2 (two) times daily. (Patient not taking: Reported on 06/14/2023)   No facility-administered encounter medications on file as of 06/14/2023.     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   Blood transfusion without reported diagnosis 2018   postpartum   Chest pain of uncertain etiology 06/08/2022   06/15/22 Myocardial perfusion - normal, 09/15/22 long term heart monitor in Epic   Chronic kidney disease    right kidney at 35%   Complication of anesthesia    Slow to wake up   Constipation 06/28/2022   Diabetes mellitus without complication (HCC)    NOT AN ISSUE SINCE GASTRIC BYPASS IN 2022. TYPE 2  LAST DOSE FRIDAY December 24, 2020 (METFORMIN)   Dizziness 08/22/2022   Dizziness 2024   Follows w/ PCP and cardiology.   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  Report date is 02/23/2022.      The CancerNext-Expanded gene panel offered by [facility] and includes sequencing, rearrangement, and RNA analysis for the following 77 genes: AIP, ALK, APC, ATM, AXIN2, BAP1, BARD1, BLM, BMPR1A, BRCA1, BRCA2, BRIP1, CDC73, CDH1, CDK4, CDKN1B, CDKN2A, CHEK2, CTNNA1, DICER1, FANCC, FH, FLCN, GALNT12, KIF1B, LZTR1,   Heart murmur    as a baby   Hepatic steatosis 09/08/2021   History of herpes simplex infection    History of partial nephrectomy 03/21/2022   Formatting of this note might be different from the original. 03/15/2021: Underwent right partial nephrectomy by Dr. Laverle Patter for right renal neoplasm. Pathology: Angiomyolipoma. Formatting of this note might be different from the original. 03/15/2021: Underwent right partial nephrectomy by Dr. Laverle Patter for right renal neoplasm. Pathology: Angiomyolipoma.   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   Morbid obesity (HCC) 06/02/2020   Neoplasm of right kidney    benign tumor of kidney   Obesity (BMI 30.0-34.9) 06/08/2022   Obstructive sleep apnea 10/29/2019   hx of before gastric bypass, no longer uses CPAP   Racing heart beat 08/22/2022   see 09/15/22 Long term heart monitor in Epic   Seasonal allergies    Ureteral obstruction, right 07/15/2021   Vaginal delivery 2009, 2010, 2015   Vitamin D insufficiency  09/30/2020   Weight loss 09/30/2020    Past Surgical History:  Procedure Laterality Date   CESAREAN SECTION  09/29/2016   CHOLECYSTECTOMY     COLONOSCOPY WITH ESOPHAGOGASTRODUODENOSCOPY (EGD)  07/22/2022   CYSTOSCOPY N/A 12/27/2022   Procedure: CYSTOSCOPY;  Surgeon: Earley Favor, MD;  Location: [facility];  Service: Gynecology;  Laterality: N/A;   CYSTOSCOPY W/ URETERAL STENT PLACEMENT Right 07/15/2021   Procedure: CYSTOSCOPY WITH RETROGRADE PYELOGRAM/URETERAL STENT PLACEMENT;  Surgeon: Heloise Purpura, MD;  Location: [facility];  Service: Urology;  Laterality: Right;   CYSTOSCOPY WITH RETROGRADE PYELOGRAM, URETEROSCOPY AND STENT PLACEMENT Right 05/03/2021   Procedure: CYSTOSCOPY WITH RIGHT RETROGRADE PYELOGRAM, URETEROSCOPY;  Surgeon: Heloise Purpura, MD;  Location: [facility];  Service: Urology;  Laterality: Right;   DILATION AND CURETTAGE OF UTERUS N/A 12/31/2015   Procedure: SUCTION DILATATION AND CURETTAGE;  Surgeon: [place] Bing, MD;  Location: [facility];  Service: Gynecology;  Laterality: N/A;  DILATION AND EVACUATION N/A 01/01/2016   Procedure: DILATATION AND EVACUATION;  Surgeon: Albino Hum, MD;  Location: [facility];  Service: Gynecology;  Laterality: N/A;   ESOPHAGOGASTRODUODENOSCOPY     GASTRIC BYPASS  06/02/2020   IR NEPHRO TUBE REMOV/FL  07/16/2021   IR NEPHROSTOMY EXCHANGE RIGHT  05/12/2021   IR NEPHROSTOMY PLACEMENT RIGHT  05/05/2021   ROBOT ASSISTED PYELOPLASTY Right 07/15/2021   Procedure: XI ROBOTIC ASSISTED  LAPAROSCOPIC PYELOPLASTY;  Surgeon: Florencio Hunting, MD;  Location: [facility];  Service: Urology;  Laterality: Right;   ROBOTIC ASSISTED LAPAROSCOPIC HYSTERECTOMY AND SALPINGECTOMY Bilateral 12/27/2022   Procedure: XI ROBOTIC ASSISTED LAPAROSCOPIC HYSTERECTOMY AND SALPINGECTOMY;  Surgeon: Reinaldo Caras, MD;  Location: [facility];  Service: Gynecology;  Laterality: Bilateral;   ROBOTIC ASSITED PARTIAL NEPHRECTOMY Right 03/15/2021    Procedure: XI ROBOTIC ASSITED PARTIAL NEPHRECTOMY;  Surgeon: Florencio Hunting, MD;  Location: [facility];  Service: Urology;  Laterality: Right;   TONSILLECTOMY     TUBAL LIGATION     UPPER GASTROINTESTINAL ENDOSCOPY     w/dilation    Family History  Problem Relation Age of Onset   Hypertension Mother    Thyroid disease Mother    Cervical cancer Mother        dx 14s   Stomach cancer Maternal Grandmother        dx 3s   Leukemia Maternal Grandfather        d. 20s   Depression Paternal Grandmother    Cancer Paternal Grandfather        dx after 28; mets   Breast cancer Maternal Great-grandmother        dx <50; mets; MGF's mother   Stomach cancer Other        MGM's brother; dx after 6   Cancer Other        MGF's sisters x3; unknown primary   Colon cancer Neg Hx    Colon polyps Neg Hx    Esophageal cancer Neg Hx    Rectal cancer Neg Hx     Social History   Social History Narrative   Not on file     Review of Systems General: Denies fevers, chills, weight loss CV: Denies chest pain, shortness of breath, palpitations Abdomen: Patient has significant excess skin on her anterior abdominal wall which she feels is contributing to her ongoing rashes and to her lower back pain.  Physical Exam    06/14/2023   11:31 AM 05/31/2023    8:36 AM 05/31/2023    8:19 AM  Vitals with BMI  Height 5\' 3"     Weight 190 lbs    BMI 33.67    Systolic 153 142 161  Diastolic 93 97 89  Pulse 91 89 87    General:  No acute distress,  Alert and oriented, Non-Toxic, Normal speech and affect Abdomen: Patient has excess skin of fat on the anterior abdominal wall which extends down to and slightly past the symphysis pubis. Mammogram: Not applicable due to age Assessment/Plan Panniculitis: Patient has a moderately large pannus and I believe would benefit from a panniculectomy.  We did discuss panniculectomy's for lower back pain.  Is very rare that a panniculectomy will improve lower back pain though it  is not entirely impossible.  I have told her that my expectation is not for any significant improvement.  Panniculectomy does however help with the ongoing rashes on the posterior aspect of the pannus and in the intertriginous regions.  I believe she would  derive benefit from this.  I showed her the location of the incisions.  We discussed the risk of bleeding, infection, and seroma formation.  She understands that she will have drains postoperatively and these will be in place for 1 to 4 weeks.  She will also need to wear compressive garment for 6 weeks postoperatively.  We did discuss the fact that especially after massive weight loss with bariatric surgery that wound healing complications are frequent.  She may require dressing changes if she has wound healing difficulties.  All questions were answered to her satisfaction.  Photographs were obtained today with her consent.  Will schedule her for panniculectomy at her request.  Michelle Lamb 06/14/2023, 1:33 PM

## 2023-06-19 DIAGNOSIS — K259 Gastric ulcer, unspecified as acute or chronic, without hemorrhage or perforation: Secondary | ICD-10-CM | POA: Diagnosis not present

## 2023-06-19 DIAGNOSIS — D500 Iron deficiency anemia secondary to blood loss (chronic): Secondary | ICD-10-CM | POA: Diagnosis not present

## 2023-06-19 DIAGNOSIS — R1319 Other dysphagia: Secondary | ICD-10-CM | POA: Diagnosis not present

## 2023-06-19 DIAGNOSIS — Z9884 Bariatric surgery status: Secondary | ICD-10-CM | POA: Diagnosis not present

## 2023-06-19 DIAGNOSIS — K3189 Other diseases of stomach and duodenum: Secondary | ICD-10-CM | POA: Diagnosis not present

## 2023-06-22 ENCOUNTER — Other Ambulatory Visit (HOSPITAL_BASED_OUTPATIENT_CLINIC_OR_DEPARTMENT_OTHER): Payer: Self-pay

## 2023-06-27 ENCOUNTER — Other Ambulatory Visit: Payer: Self-pay

## 2023-06-27 ENCOUNTER — Other Ambulatory Visit: Payer: Self-pay | Admitting: Family Medicine

## 2023-06-27 ENCOUNTER — Other Ambulatory Visit (HOSPITAL_BASED_OUTPATIENT_CLINIC_OR_DEPARTMENT_OTHER): Payer: Self-pay

## 2023-06-27 DIAGNOSIS — R150 Incomplete defecation: Secondary | ICD-10-CM | POA: Diagnosis not present

## 2023-06-27 MED ORDER — LINACLOTIDE 145 MCG PO CAPS
145.0000 ug | ORAL_CAPSULE | Freq: Every day | ORAL | 2 refills | Status: DC
  Filled 2023-06-27: qty 30, 30d supply, fill #0

## 2023-06-27 MED ORDER — HYDROCODONE-ACETAMINOPHEN 10-325 MG PO TABS
1.0000 | ORAL_TABLET | Freq: Three times a day (TID) | ORAL | 0 refills | Status: AC | PRN
  Filled 2023-06-27: qty 15, 5d supply, fill #0

## 2023-06-27 MED ORDER — LINZESS 145 MCG PO CAPS: 145.0000 ug | ORAL_CAPSULE | Freq: Every day | ORAL | 4 refills | Status: DC

## 2023-06-28 ENCOUNTER — Other Ambulatory Visit: Payer: Self-pay

## 2023-06-28 DIAGNOSIS — Z9884 Bariatric surgery status: Secondary | ICD-10-CM | POA: Diagnosis not present

## 2023-06-28 DIAGNOSIS — Z713 Dietary counseling and surveillance: Secondary | ICD-10-CM | POA: Diagnosis not present

## 2023-06-28 DIAGNOSIS — E669 Obesity, unspecified: Secondary | ICD-10-CM | POA: Diagnosis not present

## 2023-06-29 ENCOUNTER — Other Ambulatory Visit

## 2023-06-29 DIAGNOSIS — D508 Other iron deficiency anemias: Secondary | ICD-10-CM | POA: Diagnosis not present

## 2023-06-30 ENCOUNTER — Encounter: Payer: Self-pay | Admitting: Physician Assistant

## 2023-06-30 ENCOUNTER — Other Ambulatory Visit: Payer: Self-pay | Admitting: Physician Assistant

## 2023-06-30 ENCOUNTER — Other Ambulatory Visit (HOSPITAL_BASED_OUTPATIENT_CLINIC_OR_DEPARTMENT_OTHER): Payer: Self-pay

## 2023-06-30 DIAGNOSIS — R41840 Attention and concentration deficit: Secondary | ICD-10-CM

## 2023-06-30 LAB — CBC WITH DIFFERENTIAL/PLATELET
Basophils Absolute: 0.1 10*3/uL (ref 0.0–0.2)
Basos: 1 %
EOS (ABSOLUTE): 0.5 10*3/uL — ABNORMAL HIGH (ref 0.0–0.4)
Eos: 5 %
Hematocrit: 40.6 % (ref 34.0–46.6)
Hemoglobin: 12.9 g/dL (ref 11.1–15.9)
Immature Grans (Abs): 0 10*3/uL (ref 0.0–0.1)
Immature Granulocytes: 0 %
Lymphocytes Absolute: 2 10*3/uL (ref 0.7–3.1)
Lymphs: 23 %
MCH: 27 pg (ref 26.6–33.0)
MCHC: 31.8 g/dL (ref 31.5–35.7)
MCV: 85 fL (ref 79–97)
Monocytes Absolute: 0.7 10*3/uL (ref 0.1–0.9)
Monocytes: 7 %
Neutrophils Absolute: 5.7 10*3/uL (ref 1.4–7.0)
Neutrophils: 64 %
Platelets: 300 10*3/uL (ref 150–450)
RBC: 4.78 x10E6/uL (ref 3.77–5.28)
RDW: 17.5 % — ABNORMAL HIGH (ref 11.7–15.4)
WBC: 8.9 10*3/uL (ref 3.4–10.8)

## 2023-06-30 LAB — IRON,TIBC AND FERRITIN PANEL
Ferritin: 206 ng/mL — ABNORMAL HIGH (ref 15–150)
Iron Saturation: 33 % (ref 15–55)
Iron: 99 ug/dL (ref 27–159)
Total Iron Binding Capacity: 296 ug/dL (ref 250–450)
UIBC: 197 ug/dL (ref 131–425)

## 2023-06-30 MED ORDER — LISDEXAMFETAMINE DIMESYLATE 70 MG PO CAPS
70.0000 mg | ORAL_CAPSULE | Freq: Every day | ORAL | 0 refills | Status: DC
  Filled 2023-06-30: qty 30, 30d supply, fill #0

## 2023-07-02 ENCOUNTER — Other Ambulatory Visit: Payer: Self-pay | Admitting: Physician Assistant

## 2023-07-02 DIAGNOSIS — R112 Nausea with vomiting, unspecified: Secondary | ICD-10-CM

## 2023-07-02 DIAGNOSIS — D513 Other dietary vitamin B12 deficiency anemia: Secondary | ICD-10-CM

## 2023-07-02 MED ORDER — ONDANSETRON 4 MG PO TBDP
4.0000 mg | ORAL_TABLET | Freq: Three times a day (TID) | ORAL | 0 refills | Status: DC | PRN
  Filled 2023-07-02: qty 90, 30d supply, fill #0

## 2023-07-02 MED ORDER — CYANOCOBALAMIN 1000 MCG/ML IJ SOLN
INTRAMUSCULAR | 2 refills | Status: DC
  Filled 2023-07-02: qty 1, 30d supply, fill #0
  Filled 2023-07-28: qty 1, 30d supply, fill #1
  Filled 2023-08-28: qty 1, 30d supply, fill #2

## 2023-07-03 ENCOUNTER — Ambulatory Visit (INDEPENDENT_AMBULATORY_CARE_PROVIDER_SITE_OTHER): Admitting: Physician Assistant

## 2023-07-03 ENCOUNTER — Encounter: Payer: Self-pay | Admitting: Dermatology

## 2023-07-03 ENCOUNTER — Ambulatory Visit (INDEPENDENT_AMBULATORY_CARE_PROVIDER_SITE_OTHER): Admitting: Dermatology

## 2023-07-03 ENCOUNTER — Other Ambulatory Visit (HOSPITAL_BASED_OUTPATIENT_CLINIC_OR_DEPARTMENT_OTHER): Payer: Self-pay

## 2023-07-03 ENCOUNTER — Encounter: Payer: Self-pay | Admitting: Physician Assistant

## 2023-07-03 VITALS — BP 134/87 | HR 93

## 2023-07-03 VITALS — BP 132/80 | HR 97 | Temp 98.5°F | Resp 22 | Ht 63.0 in | Wt 193.6 lb

## 2023-07-03 DIAGNOSIS — J060 Acute laryngopharyngitis: Secondary | ICD-10-CM | POA: Diagnosis not present

## 2023-07-03 DIAGNOSIS — D225 Melanocytic nevi of trunk: Secondary | ICD-10-CM | POA: Diagnosis not present

## 2023-07-03 DIAGNOSIS — D2371 Other benign neoplasm of skin of right lower limb, including hip: Secondary | ICD-10-CM

## 2023-07-03 DIAGNOSIS — D229 Melanocytic nevi, unspecified: Secondary | ICD-10-CM

## 2023-07-03 DIAGNOSIS — D235 Other benign neoplasm of skin of trunk: Secondary | ICD-10-CM

## 2023-07-03 DIAGNOSIS — D485 Neoplasm of uncertain behavior of skin: Secondary | ICD-10-CM

## 2023-07-03 DIAGNOSIS — D492 Neoplasm of unspecified behavior of bone, soft tissue, and skin: Secondary | ICD-10-CM

## 2023-07-03 DIAGNOSIS — D239 Other benign neoplasm of skin, unspecified: Secondary | ICD-10-CM

## 2023-07-03 LAB — POC COVID19 BINAXNOW: SARS Coronavirus 2 Ag: NEGATIVE

## 2023-07-03 MED ORDER — TRIAMCINOLONE ACETONIDE 40 MG/ML IJ SUSP
60.0000 mg | Freq: Once | INTRAMUSCULAR | Status: AC
  Administered 2023-07-03: 60 mg via INTRAMUSCULAR

## 2023-07-03 MED ORDER — AZITHROMYCIN 250 MG PO TABS
ORAL_TABLET | ORAL | 0 refills | Status: AC
Start: 2023-07-03 — End: 2023-07-08
  Filled 2023-07-03: qty 6, 5d supply, fill #0

## 2023-07-03 MED ORDER — HYDROCODONE BIT-HOMATROP MBR 5-1.5 MG/5ML PO SOLN
5.0000 mL | Freq: Four times a day (QID) | ORAL | 0 refills | Status: DC | PRN
  Filled 2023-07-03: qty 120, 6d supply, fill #0

## 2023-07-03 NOTE — Progress Notes (Signed)
   New Patient Visit   Subjective  Michelle Lamb is a 33 y.o. female who presents for the following: growths on back and leg; patient is accompanied by her friend. Pt has spots on her back and right lower leg for a few years that her hematologist wanted evaluated  The following portions of the chart were reviewed this encounter and updated as appropriate: medications, allergies, medical history  Review of Systems:  No other skin or systemic complaints except as noted in HPI or Assessment and Plan.  Objective  Well appearing patient in no apparent distress; mood and affect are within normal limits.  A focused examination was performed of the following areas: Right  lower leg and back  Relevant exam findings are noted in the Assessment and Plan.  Left Upper Back 1.5cm irregular brown violaceous firm macule   Assessment & Plan   DERMATOFIBROMA right lower leg Exam: Firm pink/brown papulenodule with dimple sign. Treatment Plan: A dermatofibroma is a benign growth possibly related to trauma, such as an insect bite, cut from shaving, or inflamed acne-type bump.  Treatment options to remove include shave or excision with resulting scar and risk of recurrence.  Since benign-appearing and not bothersome, will observe for now.    MELANOCYTIC NEVI- back Exam: Tan-brown and/or pink-flesh-colored symmetric macules and papules  Treatment Plan: Benign appearing on exam today. Recommend observation. Call clinic for new or changing moles. Recommend daily use of broad spectrum spf 30+ sunscreen to sun-exposed areas.   NEOPLASM OF UNCERTAIN BEHAVIOR OF SKIN Left Upper Back Skin / nail biopsy Type of biopsy: tangential   Informed consent: discussed and consent obtained   Timeout: patient name, date of birth, surgical site, and procedure verified   Procedure prep:  Patient was prepped and draped in usual sterile fashion Prep type:  Isopropyl alcohol Anesthesia: the lesion was  anesthetized in a standard fashion   Anesthetic:  1% lidocaine  w/ epinephrine  1-100,000 buffered w/ 8.4% NaHCO3 Instrument used: DermaBlade   Hemostasis achieved with: pressure and aluminum chloride   Outcome: patient tolerated procedure well   Post-procedure details: sterile dressing applied and wound care instructions given   Dressing type: bandage and pressure dressing   Specimen 1 - Surgical pathology Differential Diagnosis: R/O DF vs DFSP vs MM vs other  Check Margins: No DERMATOFIBROMA    No follow-ups on file.  I, Wilson Hasten, CMA, am acting as scribe for Deneise Finlay, MD.   Documentation: I have reviewed the above documentation for accuracy and completeness, and I agree with the above.  Deneise Finlay, MD

## 2023-07-03 NOTE — Progress Notes (Signed)
 Acute Office Visit  Subjective:    Patient ID: Michelle Lamb, female    DOB: 1990-05-30, 33 y.o.   MRN: [id number]  Chief Complaint  Patient presents with   URI    HPI: Patient is in today for complaints of sinus pressure, pnd, cough and congestion.  Has also had sore throat. Symptoms started on Friday and have worsened over the weekend after going to the beach.  Denies fever but has had some mild malaise Had been clear congestion until this morning and now has thick yellow sinus drainage   Current Outpatient Medications:    azithromycin  (ZITHROMAX ) 250 MG tablet, Take 2 tablets on day 1, then 1 tablet daily on days 2 through 5, Disp: 6 tablet, Rfl: 0   HYDROcodone  bit-homatropine (HYDROMET) 5-1.5 MG/5ML syrup, Take 5 mLs by mouth every 6 (six) hours as needed., Disp: 120 mL, Rfl: 0   albuterol  (VENTOLIN  HFA) 108 (90 Base) MCG/ACT inhaler, Inhale 2 puffs into the lungs every 6 (six) hours as needed for wheezing or shortness of breath., Disp: 1 each, Rfl: 5   Continuous Glucose Sensor (FREESTYLE LIBRE 3 SENSOR) MISC, Use every 14 days - apply as directed, Disp: 2 each, Rfl: 5   cyanocobalamin  (VITAMIN B12) 1000 MCG/ML injection, Inject 1 ML as directed once a month, Disp: 1 mL, Rfl: 2   ferrous sulfate 325 (65 FE) MG EC tablet, Take 325 mg by mouth daily with breakfast., Disp: , Rfl:    fluticasone  (FLONASE ) 50 MCG/ACT nasal spray, Place 2 sprays into both nostrils daily. (Patient taking differently: Place 2 sprays into both nostrils as needed for allergies or rhinitis.), Disp: 16 g, Rfl: 6   linaclotide  (LINZESS ) 145 MCG CAPS capsule, Take 1 capsule (145 mcg total) by mouth daily before breakfast., Disp: 30 capsule, Rfl: 2   linaclotide  (LINZESS ) 145 MCG CAPS capsule, Take 1 capsule (145 mcg total) by mouth daily on empty stomach 30 minutes prior to first meal of day. Keep in original container., Disp: 30 capsule, Rfl: 4   lisdexamfetamine (VYVANSE ) 70 MG capsule, Take 1 capsule  (70 mg total) by mouth daily., Disp: 30 capsule, Rfl: 0   LORazepam  (ATIVAN ) 1 MG tablet, Take 1 tablet (1 mg total) by mouth daily as needed., Disp: 30 tablet, Rfl: 1   meclizine  (ANTIVERT ) 25 MG tablet, Take 1 tablet (25 mg total) by mouth 3 (three) times daily as needed for dizziness., Disp: 30 tablet, Rfl: 0   Multiple Vitamin (MULTIVITAMIN WITH MINERALS) TABS tablet, Take 1 tablet by mouth daily., Disp: , Rfl:    ondansetron  (ZOFRAN -ODT) 4 MG disintegrating tablet, Take 1 tablet (4 mg total) by mouth every 8 (eight) hours as needed for nausea or vomiting., Disp: 90 tablet, Rfl: 0   pantoprazole  (PROTONIX ) 40 MG tablet, Take 1 tablet (40 mg total) by mouth daily., Disp: 30 tablet, Rfl: 11   venlafaxine  XR (EFFEXOR  XR) 150 MG 24 hr capsule, Take 1 capsule (150 mg total) by mouth daily with breakfast., Disp: 30 capsule, Rfl: 2   Vitamin D , Ergocalciferol , (DRISDOL ) 1.25 MG (50000 UNIT) CAPS capsule, Twice weekly, Disp: , Rfl:   Allergies  Allergen Reactions   Oxy Ir [Oxycodone ] Itching   Dilaudid  [Hydromorphone ] Rash    ROS CONSTITUTIONAL: see HPI E/N/T: see HPI  RESPIRATORY: see HPI GASTROINTESTINAL: Negative for abdominal pain, acid reflux symptoms, constipation, diarrhea, nausea and vomiting.       Objective:    PHYSICAL EXAM:   BP 132/80   Pulse  97   Temp 98.5 F (36.9 C)   Resp (!) 22   Ht 5\' 3"  (1.6 m)   Wt 193 lb 9.6 oz (87.8 kg)   LMP 12/04/2022 (Exact Date)   SpO2 100%   BMI 34.29 kg/m    GEN: Well nourished, well developed, in no acute distress  HEENT: normal external ears and nose - normal external auditory canals and TMS - - Lips, Teeth and Gums - normal  Oropharynx - mild erythema with pnd  Cardiac: RRR; no murmurs, Respiratory:  normal respiratory rate and pattern with no distress - normal breath sounds with no rales, rhonchi, wheezes or rubs  Psych: euthymic mood, appropriate affect and demeanor Office Visit on 07/03/2023  Component Date Value Ref  Range Status   SARS Coronavirus 2 Ag 07/03/2023 Negative  Negative Final       Assessment & Plan:    Acute laryngopharyngitis -     POC COVID-19 BinaxNow -     Triamcinolone  Acetonide -     Azithromycin ; Take 2 tablets on day 1, then 1 tablet daily on days 2 through 5  Dispense: 6 tablet; Refill: 0 -     HYDROcodone  Bit-Homatrop MBr; Take 5 mLs by mouth every 6 (six) hours as needed.  Dispense: 120 mL; Refill: 0     Follow-up: Return if symptoms worsen or fail to improve.  An After Visit Summary was printed and given to the patient.  Anthonette Bastos [facility] [phone]

## 2023-07-03 NOTE — Patient Instructions (Addendum)

## 2023-07-06 LAB — SURGICAL PATHOLOGY

## 2023-07-07 ENCOUNTER — Other Ambulatory Visit: Payer: Self-pay

## 2023-07-07 DIAGNOSIS — F418 Other specified anxiety disorders: Secondary | ICD-10-CM

## 2023-07-10 ENCOUNTER — Other Ambulatory Visit (HOSPITAL_BASED_OUTPATIENT_CLINIC_OR_DEPARTMENT_OTHER): Payer: Self-pay

## 2023-07-10 MED ORDER — LORAZEPAM 1 MG PO TABS
1.0000 mg | ORAL_TABLET | Freq: Every day | ORAL | 1 refills | Status: DC | PRN
  Filled 2023-07-10: qty 30, 30d supply, fill #0

## 2023-07-12 DIAGNOSIS — Z719 Counseling, unspecified: Secondary | ICD-10-CM

## 2023-07-13 ENCOUNTER — Ambulatory Visit (INDEPENDENT_AMBULATORY_CARE_PROVIDER_SITE_OTHER): Admitting: Family Medicine

## 2023-07-13 ENCOUNTER — Other Ambulatory Visit (HOSPITAL_BASED_OUTPATIENT_CLINIC_OR_DEPARTMENT_OTHER): Payer: Self-pay

## 2023-07-13 ENCOUNTER — Encounter: Payer: Self-pay | Admitting: Family Medicine

## 2023-07-13 VITALS — BP 142/84 | HR 77 | Temp 97.4°F | Ht 63.0 in | Wt 189.0 lb

## 2023-07-13 DIAGNOSIS — M25512 Pain in left shoulder: Secondary | ICD-10-CM | POA: Diagnosis not present

## 2023-07-13 DIAGNOSIS — N184 Chronic kidney disease, stage 4 (severe): Secondary | ICD-10-CM

## 2023-07-13 DIAGNOSIS — G8929 Other chronic pain: Secondary | ICD-10-CM

## 2023-07-13 DIAGNOSIS — M545 Low back pain, unspecified: Secondary | ICD-10-CM

## 2023-07-13 MED ORDER — PREDNISONE 20 MG PO TABS
ORAL_TABLET | ORAL | 0 refills | Status: AC
  Filled 2023-07-13: qty 18, 9d supply, fill #0

## 2023-07-13 MED ORDER — HYDROCODONE-ACETAMINOPHEN 10-325 MG PO TABS
1.0000 | ORAL_TABLET | Freq: Four times a day (QID) | ORAL | 0 refills | Status: DC | PRN
  Filled 2023-07-13: qty 30, 8d supply, fill #0

## 2023-07-13 NOTE — Assessment & Plan Note (Signed)
 Left shoulder pain likely due to muscle strain from lifting and awkward sleeping position. Prefers oral medication over injections. - Prescribe prednisone  taper for inflammation. - Advise heat therapy for pain relief. - Recommend shoulder exercises for strength and flexibility.

## 2023-07-13 NOTE — Progress Notes (Signed)
 Acute Office Visit  Subjective:    Patient ID: Michelle Lamb, female    DOB: 1990-12-20, 34 y.o.   MRN: [id number]  Chief Complaint  Patient presents with   Shoulder Pain   Back Pain    Discussed the use of AI scribe software for clinical note transcription with the patient, who gave verbal consent to proceed.  History of Present Illness   Michelle Lamb is a 33 year old female who presents with left shoulder and back pain.  She experiences localized pain in her left shoulder, which does not radiate. The pain is exacerbated by movements such as typing, lifting her arm, or applying pressure. No known injury or change in sleeping arrangements, although she did sleep with a baby in her arm earlier this week, which she suspects may have contributed to the discomfort. She is apprehensive about receiving injections in her shoulder due to past painful experiences.  Her back pain is a persistent issue but had been well-managed until recently. She has previously used a pillow for support and has had steroid injections, with the last one being a Kenalog  shot a few weeks ago. She is unable to take Toradol  due to kidney concerns.  She has been caring for twelve children, including her own and those of a friend whose mother passed away, which has been physically demanding. The youngest was 36 months old weighing over 20 pounds. She feels she may have pulled something and doesn't want to exacerbate the condition any further.    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   Blood transfusion without reported diagnosis 2018    postpartum   Chest pain of uncertain etiology 06/08/2022   06/15/22 Myocardial perfusion - normal, 09/15/22 long term heart monitor in Epic   Chronic kidney disease    right kidney at 35%   Complication of anesthesia    Slow to wake up   Constipation 06/28/2022   Diabetes mellitus without complication (HCC)    NOT AN ISSUE SINCE GASTRIC BYPASS IN 2022. TYPE 2 LAST DOSE FRIDAY December 24, 2020 (METFORMIN)   Dizziness 08/22/2022   Dizziness 2024   Follows w/ PCP and cardiology.   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  Report date is 02/23/2022.      The CancerNext-Expanded gene panel offered by [facility] and includes sequencing, rearrangement, and RNA analysis for the following 77 genes: AIP, ALK, APC, ATM, AXIN2, BAP1, BARD1, BLM, BMPR1A, BRCA1, BRCA2, BRIP1, CDC73, CDH1, CDK4, CDKN1B, CDKN2A, CHEK2, CTNNA1, DICER1, FANCC, FH, FLCN, GALNT12, KIF1B, LZTR1,   Heart murmur    as a baby   Hepatic steatosis 09/08/2021   History of herpes simplex infection    History of partial nephrectomy 03/21/2022   Formatting of this note might be different from the original. 03/15/2021: Underwent right partial nephrectomy by Dr. Rozanne Corners for right renal neoplasm. Pathology: Angiomyolipoma. Formatting of this note might be different from the original. 03/15/2021: Underwent right partial nephrectomy by Dr. Rozanne Corners for right renal neoplasm. Pathology: Angiomyolipoma.   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   Morbid obesity (HCC) 06/02/2020   Neoplasm of right  kidney    benign tumor of kidney   Obesity (BMI 30.0-34.9) 06/08/2022   Obstructive sleep apnea 10/29/2019   hx of before gastric bypass, no longer uses CPAP   Racing heart beat 08/22/2022   see 09/15/22 Long term heart monitor in Epic   Seasonal allergies    Ureteral obstruction, right 07/15/2021   Vaginal delivery 2009, 2010, 2015   Vitamin D  insufficiency 09/30/2020   Weight loss 09/30/2020    Past Surgical History:  Procedure Laterality Date   CESAREAN SECTION  09/29/2016   CHOLECYSTECTOMY     COLONOSCOPY WITH ESOPHAGOGASTRODUODENOSCOPY (EGD)  07/22/2022   CYSTOSCOPY N/A 12/27/2022   Procedure: CYSTOSCOPY;  Surgeon: Reinaldo Caras, MD;  Location[facility];  Service: Gynecology;  Laterality: N/A;   CYSTOSCOPY W/ URETERAL STENT PLACEMENT Right 07/15/2021   Procedure: CYSTOSCOPY WITH RETROGRADE PYELOGRAM/URETERAL STENT PLACEMENT;  Surgeon: Florencio Hunting, MD;  Location: [facility];  Service: Urology;  Laterality: Right;   CYSTOSCOPY WITH RETROGRADE PYELOGRAM, URETEROSCOPY AND STENT PLACEMENT Right 05/03/2021   Procedure: CYSTOSCOPY WITH RIGHT RETROGRADE PYELOGRAM, URETEROSCOPY;  Surgeon: Florencio Hunting, MD;  Location: [facility];  Service: Urology;  Laterality: Right;   DILATION AND CURETTAGE OF UTERUS N/A 12/31/2015   Procedure: SUCTION DILATATION AND CURETTAGE;  Surgeon: Raynell Caller, MD;  Location: [facility];  Service: Gynecology;  Laterality: N/A;   DILATION AND EVACUATION N/A 01/01/2016   Procedure: DILATATION AND EVACUATION;  Surgeon: Albino Hum, MD;  Location: [facility];  Service: Gynecology;  Laterality: N/A;   ESOPHAGOGASTRODUODENOSCOPY     GASTRIC BYPASS  06/02/2020   IR NEPHRO TUBE REMOV/FL  07/16/2021   IR NEPHROSTOMY EXCHANGE RIGHT  05/12/2021   IR NEPHROSTOMY PLACEMENT RIGHT  05/05/2021   ROBOT ASSISTED PYELOPLASTY Right 07/15/2021   Procedure: XI ROBOTIC ASSISTED  LAPAROSCOPIC PYELOPLASTY;  Surgeon: Florencio Hunting, MD;  Location: [facility];  Service:  Urology;  Laterality: Right;   ROBOTIC ASSISTED LAPAROSCOPIC HYSTERECTOMY AND SALPINGECTOMY Bilateral 12/27/2022   Procedure: XI ROBOTIC ASSISTED LAPAROSCOPIC HYSTERECTOMY AND SALPINGECTOMY;  Surgeon: Reinaldo Caras, MD;  [facility];  Service: Gynecology;  Laterality: Bilateral;   ROBOTIC ASSITED PARTIAL NEPHRECTOMY Right 03/15/2021   Procedure: XI ROBOTIC ASSITED PARTIAL NEPHRECTOMY;  Surgeon: Florencio Hunting, MD;  Location: [facility];  Service: Urology;  Laterality: Right;   TONSILLECTOMY     TUBAL LIGATION     UPPER GASTROINTESTINAL ENDOSCOPY     w/dilation    Family History  Problem Relation Age of Onset   Hypertension Mother    Thyroid  disease Mother    Cervical cancer Mother        dx 51s   Stomach cancer Maternal Grandmother        dx 66s   Leukemia Maternal Grandfather        d. 93s   Depression Paternal Grandmother    Cancer Paternal Grandfather        dx after 81; mets   Breast cancer Maternal Great-grandmother        dx <50; mets;  MGF's mother   Stomach cancer Other        MGM's brother; dx after 51   Cancer Other        MGF's sisters x3; unknown primary   Colon cancer Neg Hx    Colon polyps Neg Hx    Esophageal cancer Neg Hx    Rectal cancer Neg Hx     Social History   Socioeconomic History   Marital status: Married    Spouse name: Not on file   Number of children: 9   Years of education: 13   Highest education level: Associate degree: occupational, Scientist, product/process development, or vocational program  Occupational History   Occupation: Scientist, forensic  Tobacco Use   Smoking status: Never   Smokeless tobacco: Never  Vaping Use   Vaping status: Never Used  Substance and Sexual Activity   Alcohol use: Yes    Comment: occasionally   Drug use: Never   Sexual activity: Not Currently    Partners: Male    Birth control/protection: Surgical    Comment: tubal, hysterectomy  Other Topics Concern   Not on file  Social History Narrative    Not on file   Social Drivers of Health   Financial Resource Strain: Low Risk  (08/21/2022)   Overall Financial Resource Strain (CARDIA)    Difficulty of Paying Living Expenses: Not hard at all  Food Insecurity: No Food Insecurity (08/21/2022)   Hunger Vital Sign    Worried About Running Out of Food in the Last Year: Never true    Ran Out of Food in the Last Year: Never true  Transportation Needs: No Transportation Needs (08/21/2022)   PRAPARE - Administrator, Civil Service (Medical): No    Lack of Transportation (Non-Medical): No  Physical Activity: Insufficiently Active (08/21/2022)   Exercise Vital Sign    Days of Exercise per Week: 3 days    Minutes of Exercise per Session: 40 min  Stress: Stress Concern Present (08/21/2022)   [employer] of Occupational Health - Occupational Stress Questionnaire    Feeling of Stress : To some extent  Social Connections: Socially Integrated (08/21/2022)   Social Connection and Isolation Panel [NHANES]    Frequency of Communication with Friends and Family: More than three times a week    Frequency of Social Gatherings with Friends and Family: More than three times a week    Attends Religious Services: 1 to 4 times per year    Active Member of [employer] or Organizations: Yes    Attends Engineer, structural: More than 4 times per year    Marital Status: Married  Catering manager Violence: Not At Risk (04/07/2023)   Humiliation, Afraid, Rape, and Kick questionnaire    Fear of Current or Ex-Partner: No    Emotionally Abused: No    Physically Abused: No    Sexually Abused: No    Outpatient Medications Prior to Visit  Medication Sig Dispense Refill   albuterol  (VENTOLIN  HFA) 108 (90 Base) MCG/ACT inhaler Inhale 2 puffs into the lungs every 6 (six) hours as needed for wheezing or shortness of breath. 1 each 5   Continuous Glucose Sensor (FREESTYLE LIBRE 3 SENSOR) MISC Use every 14 days - apply as directed 2 each 5    cyanocobalamin  (VITAMIN B12) 1000 MCG/ML injection Inject 1 ML as directed once a month 1 mL 2   ferrous sulfate 325 (65 FE) MG EC tablet Take 325 mg by mouth daily with breakfast.  fluticasone  (FLONASE ) 50 MCG/ACT nasal spray Place 2 sprays into both nostrils daily. (Patient taking differently: Place 2 sprays into both nostrils as needed for allergies or rhinitis.) 16 g 6   linaclotide  (LINZESS ) 145 MCG CAPS capsule Take 1 capsule (145 mcg total) by mouth daily before breakfast. 30 capsule 2   linaclotide  (LINZESS ) 145 MCG CAPS capsule Take 1 capsule (145 mcg total) by mouth daily on empty stomach 30 minutes prior to first meal of day. Keep in original container. 30 capsule 4   lisdexamfetamine (VYVANSE ) 70 MG capsule Take 1 capsule (70 mg total) by mouth daily. 30 capsule 0   LORazepam  (ATIVAN ) 1 MG tablet Take 1 tablet (1 mg total) by mouth daily as needed. 30 tablet 1   meclizine  (ANTIVERT ) 25 MG tablet Take 1 tablet (25 mg total) by mouth 3 (three) times daily as needed for dizziness. 30 tablet 0   Multiple Vitamin (MULTIVITAMIN WITH MINERALS) TABS tablet Take 1 tablet by mouth daily.     ondansetron  (ZOFRAN -ODT) 4 MG disintegrating tablet Take 1 tablet (4 mg total) by mouth every 8 (eight) hours as needed for nausea or vomiting. 90 tablet 0   pantoprazole  (PROTONIX ) 40 MG tablet Take 1 tablet (40 mg total) by mouth daily. 30 tablet 11   venlafaxine  XR (EFFEXOR  XR) 150 MG 24 hr capsule Take 1 capsule (150 mg total) by mouth daily with breakfast. 30 capsule 2   Vitamin D , Ergocalciferol , (DRISDOL ) 1.25 MG (50000 UNIT) CAPS capsule Twice weekly     HYDROcodone  bit-homatropine (HYDROMET) 5-1.5 MG/5ML syrup Take 5 mLs by mouth every 6 (six) hours as needed. 120 mL 0   No facility-administered medications prior to visit.    Allergies  Allergen Reactions   Oxy Ir [Oxycodone ] Itching   Dilaudid  [Hydromorphone ] Rash    Review of Systems  Constitutional:  Negative for chills, fatigue and  fever.  HENT:  Negative for congestion, ear pain and sore throat.   Respiratory:  Negative for cough and shortness of breath.   Cardiovascular:  Negative for chest pain.  Gastrointestinal:  Negative for abdominal pain, constipation, diarrhea, nausea and vomiting.  Genitourinary:  Negative for dysuria and frequency.  Musculoskeletal:  Positive for arthralgias (shoulder pain), back pain and myalgias.  Neurological:  Negative for dizziness and headaches.  Psychiatric/Behavioral:  Negative for dysphoric mood. The patient is not nervous/anxious.        Objective:         07/13/2023    3:07 PM 07/03/2023    1:30 PM 07/03/2023    7:47 AM  Vitals with BMI  Height 5\' 3"   5\' 3"   Weight 189 lbs  193 lbs 10 oz  BMI 33.49  34.3  Systolic 142 134 161  Diastolic 84 87 80  Pulse 77 93 97    No data found.   Physical Exam Vitals reviewed.  Constitutional:      General: She is not in acute distress.    Appearance: Normal appearance.  Eyes:     Conjunctiva/sclera: Conjunctivae normal.  Neck:     Vascular: No carotid bruit.  Cardiovascular:     Rate and Rhythm: Normal rate and regular rhythm.     Heart sounds: Normal heart sounds.  Pulmonary:     Effort: Pulmonary effort is normal.     Breath sounds: Normal breath sounds. No wheezing.  Musculoskeletal:     Right shoulder: Normal.     Left shoulder: Tenderness present. No swelling or deformity. Decreased range of  motion. Decreased strength.  Skin:    General: Skin is warm.  Neurological:     Mental Status: She is alert. Mental status is at baseline.  Psychiatric:        Mood and Affect: Mood normal.        Behavior: Behavior normal.     Health Maintenance Due  Topic Date Due   Pneumococcal Vaccine 37-57 Years old (1 of 2 - PCV) Never done   Cervical Cancer Screening (HPV/Pap Cotest)  Never done    There are no preventive care reminders to display for this patient.   Lab Results  Component Value Date   TSH 1.140 03/28/2023    Lab Results  Component Value Date   WBC 8.9 06/29/2023   HGB 12.9 06/29/2023   HCT 40.6 06/29/2023   MCV 85 06/29/2023   PLT 300 06/29/2023   Lab Results  Component Value Date   NA 137 05/01/2023   K 4.5 05/01/2023   CO2 24 05/01/2023   GLUCOSE 74 05/01/2023   BUN 12 05/01/2023   CREATININE 0.51 (L) 05/01/2023   BILITOT 0.5 05/01/2023   ALKPHOS 91 05/01/2023   AST 28 05/01/2023   ALT 28 05/01/2023   PROT 6.9 05/01/2023   ALBUMIN 4.6 05/01/2023   CALCIUM 9.2 05/01/2023   ANIONGAP 8 03/15/2022   EGFR 126 05/01/2023   Lab Results  Component Value Date   CHOL 184 08/17/2022   Lab Results  Component Value Date   HDL 74 08/17/2022   Lab Results  Component Value Date   LDLCALC 99 08/17/2022   Lab Results  Component Value Date   TRIG 58 08/17/2022   Lab Results  Component Value Date   CHOLHDL 2.5 08/17/2022   Lab Results  Component Value Date   HGBA1C 5.3 08/17/2022       Assessment & Plan:  Acute pain of left shoulder Assessment & Plan: Left shoulder pain likely due to muscle strain from lifting and awkward sleeping position. Prefers oral medication over injections. - Prescribe prednisone  taper for inflammation. - Advise heat therapy for pain relief. - Recommend shoulder exercises for strength and flexibility.  Orders: -     predniSONE ; Take 3 tablets (60 mg total) by mouth daily with breakfast for 3 days, THEN 2 tablets (40 mg total) daily with breakfast for 3 days, THEN 1 tablet (20 mg total) daily with breakfast for 3 days.  Dispense: 18 tablet; Refill: 0 -     HYDROcodone -Acetaminophen ; Take 1 tablet by mouth every 6 (six) hours as needed.  Dispense: 30 tablet; Refill: 0  Chronic bilateral low back pain without sciatica Assessment & Plan: Chronic back pain with some improvement from supportive measures. Awaiting surgery with uncertain efficacy. Body fat distribution may affect posture and pain. - Continue current pain management regimen. - Await  surgical intervention and reassess post-surgery.   Stage 4 chronic kidney disease (HCC) Assessment & Plan: Chronic kidney disease limits medication options. - Avoid NSAIDs like Toradol  due to kidney condition.       Meds ordered this encounter  Medications   predniSONE  (DELTASONE ) 20 MG tablet    Sig: Take 3 tablets (60 mg total) by mouth daily with breakfast for 3 days, THEN 2 tablets (40 mg total) daily with breakfast for 3 days, THEN 1 tablet (20 mg total) daily with breakfast for 3 days.    Dispense:  18 tablet    Refill:  0   HYDROcodone -acetaminophen  (NORCO) 10-325 MG tablet  Sig: Take 1 tablet by mouth every 6 (six) hours as needed.    Dispense:  30 tablet    Refill:  0    No orders of the defined types were placed in this encounter.        Follow-up: Return if symptoms worsen or fail to improve.  An After Visit Summary was printed and given to the patient.  Delford Felling, FNP [facility] [phone]

## 2023-07-13 NOTE — Assessment & Plan Note (Signed)
 Chronic kidney disease limits medication options. - Avoid NSAIDs like Toradol  due to kidney condition.

## 2023-07-13 NOTE — Assessment & Plan Note (Signed)
 Chronic back pain with some improvement from supportive measures. Awaiting surgery with uncertain efficacy. Body fat distribution may affect posture and pain. - Continue current pain management regimen. - Await surgical intervention and reassess post-surgery.

## 2023-07-21 ENCOUNTER — Other Ambulatory Visit (HOSPITAL_BASED_OUTPATIENT_CLINIC_OR_DEPARTMENT_OTHER): Payer: Self-pay

## 2023-07-21 MED ORDER — LINZESS 290 MCG PO CAPS
290.0000 ug | ORAL_CAPSULE | Freq: Every day | ORAL | 4 refills | Status: AC
  Filled 2023-07-21: qty 90, 90d supply, fill #0
  Filled 2023-11-10: qty 90, 90d supply, fill #1
  Filled 2024-03-08: qty 90, 90d supply, fill #2

## 2023-07-25 ENCOUNTER — Other Ambulatory Visit (HOSPITAL_BASED_OUTPATIENT_CLINIC_OR_DEPARTMENT_OTHER): Payer: Self-pay

## 2023-07-26 ENCOUNTER — Other Ambulatory Visit: Payer: Self-pay | Admitting: Family Medicine

## 2023-07-26 ENCOUNTER — Other Ambulatory Visit: Payer: Self-pay | Admitting: Physician Assistant

## 2023-07-26 ENCOUNTER — Other Ambulatory Visit (HOSPITAL_BASED_OUTPATIENT_CLINIC_OR_DEPARTMENT_OTHER): Payer: Self-pay

## 2023-07-26 DIAGNOSIS — M25512 Pain in left shoulder: Secondary | ICD-10-CM

## 2023-07-26 DIAGNOSIS — F418 Other specified anxiety disorders: Secondary | ICD-10-CM

## 2023-07-27 ENCOUNTER — Other Ambulatory Visit (HOSPITAL_BASED_OUTPATIENT_CLINIC_OR_DEPARTMENT_OTHER): Payer: Self-pay

## 2023-07-27 MED ORDER — VENLAFAXINE HCL ER 150 MG PO CP24
150.0000 mg | ORAL_CAPSULE | Freq: Every day | ORAL | 0 refills | Status: DC
  Filled 2023-07-27: qty 90, 90d supply, fill #0

## 2023-07-28 ENCOUNTER — Other Ambulatory Visit (HOSPITAL_BASED_OUTPATIENT_CLINIC_OR_DEPARTMENT_OTHER): Payer: Self-pay

## 2023-07-28 ENCOUNTER — Other Ambulatory Visit: Payer: Self-pay | Admitting: Family Medicine

## 2023-07-28 DIAGNOSIS — M25512 Pain in left shoulder: Secondary | ICD-10-CM

## 2023-07-30 MED ORDER — HYDROCODONE-ACETAMINOPHEN 10-325 MG PO TABS
1.0000 | ORAL_TABLET | Freq: Four times a day (QID) | ORAL | 0 refills | Status: DC | PRN
  Filled 2023-07-30: qty 30, 8d supply, fill #0

## 2023-07-31 ENCOUNTER — Other Ambulatory Visit (HOSPITAL_BASED_OUTPATIENT_CLINIC_OR_DEPARTMENT_OTHER): Payer: Self-pay

## 2023-08-02 ENCOUNTER — Other Ambulatory Visit (HOSPITAL_BASED_OUTPATIENT_CLINIC_OR_DEPARTMENT_OTHER): Payer: Self-pay

## 2023-08-02 ENCOUNTER — Other Ambulatory Visit: Payer: Self-pay | Admitting: Physician Assistant

## 2023-08-02 DIAGNOSIS — R41840 Attention and concentration deficit: Secondary | ICD-10-CM

## 2023-08-02 MED ORDER — LISDEXAMFETAMINE DIMESYLATE 70 MG PO CAPS
70.0000 mg | ORAL_CAPSULE | Freq: Every day | ORAL | 0 refills | Status: DC
  Filled 2023-08-02: qty 30, 30d supply, fill #0

## 2023-08-08 NOTE — H&P (View-Only) (Signed)
 Patient ID: Michelle Lamb, female    DOB: Apr 11, 1990, 33 y.o.   MRN: [id number]  No chief complaint on file.   No diagnosis found.   History of Present Illness: Michelle Lamb is a 33 y.o.  female  with a history of ***.  She presents for preoperative evaluation for upcoming procedure, ***, scheduled for *** with Dr. {[id number]::"WGNF","[id number]"}.  The patient {HAS HAS ION:62952} had problems with anesthesia. ***  Summary of Previous Visit: ***  Job: ***  PMH Significant for: ***   Orthostatic hypotension ABLA, symptomatic anemia but didn't require transfusion Kidney surgery, panniculectomy, gastric bypass Iron  infusions?   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 4 weeks FMLA.  NINE kids at home.   Past Medical History: Allergies: Allergies  Allergen Reactions   Oxy Ir [Oxycodone ] Itching   Dilaudid  [Hydromorphone ] Rash    Current Medications:  Current Outpatient Medications:    albuterol  (VENTOLIN  HFA) 108 (90 Base) MCG/ACT inhaler, Inhale 2 puffs into the lungs every 6 (six) hours as needed for wheezing or shortness of breath., Disp: 1 each, Rfl: 5   Continuous Glucose Sensor (FREESTYLE LIBRE 3 SENSOR) MISC, Use every 14 days - apply as directed, Disp: 2 each, Rfl: 5   cyanocobalamin  (VITAMIN B12) 1000 MCG/ML injection, Inject 1 ML as directed once a month, Disp: 1 mL, Rfl: 2   ferrous sulfate 325 (65 FE) MG EC tablet, Take 325 mg by mouth daily with breakfast., Disp: , Rfl:    fluticasone  (FLONASE ) 50 MCG/ACT nasal spray, Place 2 sprays into both nostrils daily. (Patient taking differently: Place 2 sprays into both nostrils as needed for allergies or rhinitis.), Disp: 16 g, Rfl: 6   HYDROcodone -acetaminophen  (NORCO) 10-325 MG tablet, Take 1 tablet by mouth every 6 (six) hours as needed., Disp: 30 tablet, Rfl: 0   linaclotide  (LINZESS ) 145 MCG CAPS capsule, Take 1 capsule (145 mcg total) by mouth daily before breakfast., Disp: 30 capsule, Rfl: 2   linaclotide   (LINZESS ) 145 MCG CAPS capsule, Take 1 capsule (145 mcg total) by mouth daily on empty stomach 30 minutes prior to first meal of day. Keep in original container., Disp: 30 capsule, Rfl: 4   linaclotide  (LINZESS ) 290 MCG CAPS capsule, Take one capsule on an empty stomach 30 minutes prior to first meal of the day. Keep medicine in its original container., Disp: 90 capsule, Rfl: 4   lisdexamfetamine (VYVANSE ) 70 MG capsule, Take 1 capsule (70 mg total) by mouth daily., Disp: 30 capsule, Rfl: 0   LORazepam  (ATIVAN ) 1 MG tablet, Take 1 tablet (1 mg total) by mouth daily as needed., Disp: 30 tablet, Rfl: 1   meclizine  (ANTIVERT ) 25 MG tablet, Take 1 tablet (25 mg total) by mouth 3 (three) times daily as needed for dizziness., Disp: 30 tablet, Rfl: 0   Multiple Vitamin (MULTIVITAMIN WITH MINERALS) TABS tablet, Take 1 tablet by mouth daily., Disp: , Rfl:    ondansetron  (ZOFRAN -ODT) 4 MG disintegrating tablet, Take 1 tablet (4 mg total) by mouth every 8 (eight) hours as needed for nausea or vomiting., Disp: 90 tablet, Rfl: 0   pantoprazole  (PROTONIX ) 40 MG tablet, Take 1 tablet (40 mg total) by mouth daily., Disp: 30 tablet, Rfl: 11   venlafaxine  XR (EFFEXOR  XR) 150 MG 24 hr capsule, Take 1 capsule (150 mg total) by mouth daily with breakfast., Disp: 90 capsule, Rfl: 0   Vitamin D , Ergocalciferol , (DRISDOL ) 1.25 MG (50000 UNIT) CAPS capsule, Twice weekly, Disp: , Rfl:   Past Medical  Problems: Past Medical History:  Diagnosis Date   Abnormal menses 08/22/2022   Absolute anemia 10/12/2021   Acute cystitis with hematuria 12/22/2020   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   Blood transfusion without reported diagnosis 2018   postpartum   Chest pain of uncertain etiology 06/08/2022    06/15/22 Myocardial perfusion - normal, 09/15/22 long term heart monitor in Epic   Chronic kidney disease    right kidney at 35%   Complication of anesthesia    Slow to wake up   Constipation 06/28/2022   Diabetes mellitus without complication (HCC)    NOT AN ISSUE SINCE GASTRIC BYPASS IN 2022. TYPE 2 LAST DOSE FRIDAY December 24, 2020 (METFORMIN)   Dizziness 08/22/2022   Dizziness 2024   Follows w/ PCP and cardiology.   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  Report date is 02/23/2022.      The CancerNext-Expanded gene panel offered by [facility] and includes sequencing, rearrangement, and RNA analysis for the following 77 genes: AIP, ALK, APC, ATM, AXIN2, BAP1, BARD1, BLM, BMPR1A, BRCA1, BRCA2, BRIP1, CDC73, CDH1, CDK4, CDKN1B, CDKN2A, CHEK2, CTNNA1, DICER1, FANCC, FH, FLCN, GALNT12, KIF1B, LZTR1,   Heart murmur    as a baby   Hepatic steatosis 09/08/2021   History of herpes simplex infection    History of partial nephrectomy 03/21/2022   Formatting of this note might be different from the original. 03/15/2021: Underwent right partial nephrectomy by Dr. Rozanne Corners for right renal neoplasm. Pathology: Angiomyolipoma. Formatting of this note might be different from the original. 03/15/2021: Underwent right partial nephrectomy by Dr. Rozanne Corners for right renal neoplasm. Pathology: Angiomyolipoma.   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   Morbid obesity (HCC) 06/02/2020   Neoplasm of right kidney    benign tumor of kidney   Obesity (BMI 30.0-34.9)  06/08/2022   Obstructive sleep apnea 10/29/2019   hx of before gastric bypass, no longer uses CPAP   Racing heart beat 08/22/2022   see 09/15/22 Long term heart monitor in Epic   Seasonal allergies    Ureteral obstruction, right 07/15/2021   Vaginal delivery 2009, 2010, 2015   Vitamin D  insufficiency 09/30/2020   Weight loss 09/30/2020    Past Surgical History: Past Surgical History:  Procedure Laterality Date   CESAREAN SECTION  09/29/2016   CHOLECYSTECTOMY     COLONOSCOPY WITH ESOPHAGOGASTRODUODENOSCOPY (EGD)  07/22/2022   CYSTOSCOPY N/A 12/27/2022   Procedure: CYSTOSCOPY;  Surgeon: Reinaldo Caras, MD;  Location: [facility];  Service: Gynecology;  Laterality: N/A;   CYSTOSCOPY W/ URETERAL STENT PLACEMENT Right 07/15/2021   Procedure: CYSTOSCOPY WITH RETROGRADE PYELOGRAM/URETERAL STENT PLACEMENT;  Surgeon: Florencio Hunting, MD;  Location: [facility];  Service: Urology;  Laterality: Right;   CYSTOSCOPY WITH RETROGRADE PYELOGRAM, URETEROSCOPY AND STENT PLACEMENT Right 05/03/2021   Procedure:  CYSTOSCOPY WITH RIGHT RETROGRADE PYELOGRAM, URETEROSCOPY;  Surgeon: Florencio Hunting, MD;  Location: [facility];  Service: Urology;  Laterality: Right;   DILATION AND CURETTAGE OF UTERUS N/A 12/31/2015   Procedure: SUCTION DILATATION AND CURETTAGE;  Surgeon: Raynell Caller, MD;  Location: [facility];  Service: Gynecology;  Laterality: N/A;   DILATION AND EVACUATION N/A 01/01/2016   Procedure: DILATATION AND EVACUATION;  Surgeon: Albino Hum, MD;  Location: [facility];  Service: Gynecology;  Laterality: N/A;   ESOPHAGOGASTRODUODENOSCOPY     GASTRIC BYPASS  06/02/2020   IR NEPHRO TUBE REMOV/FL  07/16/2021   IR NEPHROSTOMY EXCHANGE RIGHT  05/12/2021   IR NEPHROSTOMY PLACEMENT RIGHT  05/05/2021   ROBOT ASSISTED PYELOPLASTY Right 07/15/2021   Procedure: XI ROBOTIC ASSISTED  LAPAROSCOPIC PYELOPLASTY;  Surgeon: Florencio Hunting, MD;  Location: [facility];  Service: Urology;  Laterality: Right;    ROBOTIC ASSISTED LAPAROSCOPIC HYSTERECTOMY AND SALPINGECTOMY Bilateral 12/27/2022   Procedure: XI ROBOTIC ASSISTED LAPAROSCOPIC HYSTERECTOMY AND SALPINGECTOMY;  Surgeon: Reinaldo Caras, MD;  Location: [facility];  Service: Gynecology;  Laterality: Bilateral;   ROBOTIC ASSITED PARTIAL NEPHRECTOMY Right 03/15/2021   Procedure: XI ROBOTIC ASSITED PARTIAL NEPHRECTOMY;  Surgeon: Florencio Hunting, MD;  Location: [facility];  Service: Urology;  Laterality: Right;   TONSILLECTOMY     TUBAL LIGATION     UPPER GASTROINTESTINAL ENDOSCOPY     w/dilation    Social History: Social History   Socioeconomic History   Marital status: Married    Spouse name: Not on file   Number of children: 9   Years of education: 13   Highest education level: Associate degree: occupational, Scientist, product/process development, or vocational program  Occupational History   Occupation: Scientist, forensic  Tobacco Use   Smoking status: Never   Smokeless tobacco: Never  Vaping Use   Vaping status: Never Used  Substance and Sexual Activity   Alcohol use: Yes    Comment: occasionally   Drug use: Never   Sexual activity: Not Currently    Partners: Male    Birth control/protection: Surgical    Comment: tubal, hysterectomy  Other Topics Concern   Not on file  Social History Narrative   Not on file   Social Drivers of Health   Financial Resource Strain: Low Risk  (08/21/2022)   Overall Financial Resource Strain (CARDIA)    Difficulty of Paying Living Expenses: Not hard at all  Food Insecurity: No Food Insecurity (08/21/2022)   Hunger Vital Sign    Worried About Running Out of Food in the Last Year: Never true    Ran Out of Food in the Last Year: Never true  Transportation Needs: No Transportation Needs (08/21/2022)   PRAPARE - Administrator, Civil Service (Medical): No    Lack of Transportation (Non-Medical): No  Physical Activity: Insufficiently Active (08/21/2022)   Exercise Vital Sign    Days of  Exercise per Week: 3 days    Minutes of Exercise per Session: 40 min  Stress: Stress Concern Present (08/21/2022)   [employer] of Occupational Health - Occupational Stress Questionnaire    Feeling of Stress : To some extent  Social Connections: Socially Integrated (08/21/2022)   Social Connection and Isolation Panel [NHANES]    Frequency of Communication with Friends and Family: More than three times a week    Frequency of Social Gatherings with Friends and Family: More than three times a week    Attends Religious Services: 1 to 4 times per year  Active Member of Clubs or Organizations: Yes    Attends Banker Meetings: More than 4 times per year    Marital Status: Married  Catering manager Violence: Not At Risk (04/07/2023)   Humiliation, Afraid, Rape, and Kick questionnaire    Fear of Current or Ex-Partner: No    Emotionally Abused: No    Physically Abused: No    Sexually Abused: No    Family History: Family History  Problem Relation Age of Onset   Hypertension Mother    Thyroid  disease Mother    Cervical cancer Mother        dx 69s   Stomach cancer Maternal Grandmother        dx 65s   Leukemia Maternal Grandfather        d. 60s   Depression Paternal Grandmother    Cancer Paternal Grandfather        dx after 23; mets   Breast cancer Maternal Great-grandmother        dx <50; mets; MGF's mother   Stomach cancer Other        MGM's brother; dx after 7   Cancer Other        MGF's sisters x3; unknown primary   Colon cancer Neg Hx    Colon polyps Neg Hx    Esophageal cancer Neg Hx    Rectal cancer Neg Hx     Review of Systems: ROS  Physical Exam: Vital Signs LMP 12/04/2022 (Exact Date)   Physical Exam *** Constitutional:      General: Not in acute distress.    Appearance: Normal appearance. Not ill-appearing.  HENT:     Head: Normocephalic and atraumatic.  Eyes:     Pupils: Pupils are equal, round. Cardiovascular:     Rate and Rhythm: Normal  rate.    Pulses: Normal pulses.  Pulmonary:     Effort: No respiratory distress or increased work of breathing.  Speaks in full sentences. Abdominal:     General: Abdomen is flat. No distension.   Musculoskeletal: Normal range of motion. No lower extremity swelling or edema. No varicosities. *** Skin:    General: Skin is warm and dry.     Findings: No erythema or rash.  Neurological:     Mental Status: Alert and oriented to person, place, and time.  Psychiatric:        Mood and Affect: Mood normal.        Behavior: Behavior normal.    Assessment/Plan: The patient is scheduled for *** with Dr. Eudora Heron.  Risks, benefits, and alternatives of procedure discussed, questions answered and consent obtained.    Smoking Status: ***; Counseling Given? *** Last Mammogram: ***; Results: ***  Caprini Score: ***; Risk Factors include: ***, BMI *** 25, and length of planned surgery. Recommendation for mechanical *** pharmacological prophylaxis. Encourage early ambulation.   Pictures obtained: ***  Post-op Rx sent to pharmacy: ***  Patient was provided with the *** General Surgical Risk consent document and Pain Medication Agreement prior to their appointment.  They had adequate time to read through the risk consent documents and Pain Medication Agreement. We also discussed them in person together during this preop appointment. All of their questions were answered to their satisfaction.  Recommended calling if they have any further questions.  Risk consent form and Pain Medication Agreement to be scanned into patient's chart.  ***   Electronically signed by: Mariel Shope, PA-C 08/08/2023 4:07 PM

## 2023-08-08 NOTE — Progress Notes (Signed)
 Patient ID: Michelle Lamb, female    DOB: Apr 11, 1990, 33 y.o.   MRN: [id number]  No chief complaint on file.   No diagnosis found.   History of Present Illness: Michelle Lamb is a 33 y.o.  female  with a history of ***.  She presents for preoperative evaluation for upcoming procedure, ***, scheduled for *** with Dr. {[id number]::"WGNF","[id number]"}.  The patient {HAS HAS ION:62952} had problems with anesthesia. ***  Summary of Previous Visit: ***  Job: ***  PMH Significant for: ***   Orthostatic hypotension ABLA, symptomatic anemia but didn't require transfusion Kidney surgery, panniculectomy, gastric bypass Iron  infusions?   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 4 weeks FMLA.  NINE kids at home.   Past Medical History: Allergies: Allergies  Allergen Reactions   Oxy Ir [Oxycodone ] Itching   Dilaudid  [Hydromorphone ] Rash    Current Medications:  Current Outpatient Medications:    albuterol  (VENTOLIN  HFA) 108 (90 Base) MCG/ACT inhaler, Inhale 2 puffs into the lungs every 6 (six) hours as needed for wheezing or shortness of breath., Disp: 1 each, Rfl: 5   Continuous Glucose Sensor (FREESTYLE LIBRE 3 SENSOR) MISC, Use every 14 days - apply as directed, Disp: 2 each, Rfl: 5   cyanocobalamin  (VITAMIN B12) 1000 MCG/ML injection, Inject 1 ML as directed once a month, Disp: 1 mL, Rfl: 2   ferrous sulfate 325 (65 FE) MG EC tablet, Take 325 mg by mouth daily with breakfast., Disp: , Rfl:    fluticasone  (FLONASE ) 50 MCG/ACT nasal spray, Place 2 sprays into both nostrils daily. (Patient taking differently: Place 2 sprays into both nostrils as needed for allergies or rhinitis.), Disp: 16 g, Rfl: 6   HYDROcodone -acetaminophen  (NORCO) 10-325 MG tablet, Take 1 tablet by mouth every 6 (six) hours as needed., Disp: 30 tablet, Rfl: 0   linaclotide  (LINZESS ) 145 MCG CAPS capsule, Take 1 capsule (145 mcg total) by mouth daily before breakfast., Disp: 30 capsule, Rfl: 2   linaclotide   (LINZESS ) 145 MCG CAPS capsule, Take 1 capsule (145 mcg total) by mouth daily on empty stomach 30 minutes prior to first meal of day. Keep in original container., Disp: 30 capsule, Rfl: 4   linaclotide  (LINZESS ) 290 MCG CAPS capsule, Take one capsule on an empty stomach 30 minutes prior to first meal of the day. Keep medicine in its original container., Disp: 90 capsule, Rfl: 4   lisdexamfetamine (VYVANSE ) 70 MG capsule, Take 1 capsule (70 mg total) by mouth daily., Disp: 30 capsule, Rfl: 0   LORazepam  (ATIVAN ) 1 MG tablet, Take 1 tablet (1 mg total) by mouth daily as needed., Disp: 30 tablet, Rfl: 1   meclizine  (ANTIVERT ) 25 MG tablet, Take 1 tablet (25 mg total) by mouth 3 (three) times daily as needed for dizziness., Disp: 30 tablet, Rfl: 0   Multiple Vitamin (MULTIVITAMIN WITH MINERALS) TABS tablet, Take 1 tablet by mouth daily., Disp: , Rfl:    ondansetron  (ZOFRAN -ODT) 4 MG disintegrating tablet, Take 1 tablet (4 mg total) by mouth every 8 (eight) hours as needed for nausea or vomiting., Disp: 90 tablet, Rfl: 0   pantoprazole  (PROTONIX ) 40 MG tablet, Take 1 tablet (40 mg total) by mouth daily., Disp: 30 tablet, Rfl: 11   venlafaxine  XR (EFFEXOR  XR) 150 MG 24 hr capsule, Take 1 capsule (150 mg total) by mouth daily with breakfast., Disp: 90 capsule, Rfl: 0   Vitamin D , Ergocalciferol , (DRISDOL ) 1.25 MG (50000 UNIT) CAPS capsule, Twice weekly, Disp: , Rfl:   Past Medical  Problems: Past Medical History:  Diagnosis Date   Abnormal menses 08/22/2022   Absolute anemia 10/12/2021   Acute cystitis with hematuria 12/22/2020   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   Blood transfusion without reported diagnosis 2018   postpartum   Chest pain of uncertain etiology 06/08/2022    06/15/22 Myocardial perfusion - normal, 09/15/22 long term heart monitor in Epic   Chronic kidney disease    right kidney at 35%   Complication of anesthesia    Slow to wake up   Constipation 06/28/2022   Diabetes mellitus without complication (HCC)    NOT AN ISSUE SINCE GASTRIC BYPASS IN 2022. TYPE 2 LAST DOSE FRIDAY December 24, 2020 (METFORMIN)   Dizziness 08/22/2022   Dizziness 2024   Follows w/ PCP and cardiology.   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  Report date is 02/23/2022.      The CancerNext-Expanded gene panel offered by [facility] and includes sequencing, rearrangement, and RNA analysis for the following 77 genes: AIP, ALK, APC, ATM, AXIN2, BAP1, BARD1, BLM, BMPR1A, BRCA1, BRCA2, BRIP1, CDC73, CDH1, CDK4, CDKN1B, CDKN2A, CHEK2, CTNNA1, DICER1, FANCC, FH, FLCN, GALNT12, KIF1B, LZTR1,   Heart murmur    as a baby   Hepatic steatosis 09/08/2021   History of herpes simplex infection    History of partial nephrectomy 03/21/2022   Formatting of this note might be different from the original. 03/15/2021: Underwent right partial nephrectomy by Dr. Rozanne Corners for right renal neoplasm. Pathology: Angiomyolipoma. Formatting of this note might be different from the original. 03/15/2021: Underwent right partial nephrectomy by Dr. Rozanne Corners for right renal neoplasm. Pathology: Angiomyolipoma.   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   Morbid obesity (HCC) 06/02/2020   Neoplasm of right kidney    benign tumor of kidney   Obesity (BMI 30.0-34.9)  06/08/2022   Obstructive sleep apnea 10/29/2019   hx of before gastric bypass, no longer uses CPAP   Racing heart beat 08/22/2022   see 09/15/22 Long term heart monitor in Epic   Seasonal allergies    Ureteral obstruction, right 07/15/2021   Vaginal delivery 2009, 2010, 2015   Vitamin D  insufficiency 09/30/2020   Weight loss 09/30/2020    Past Surgical History: Past Surgical History:  Procedure Laterality Date   CESAREAN SECTION  09/29/2016   CHOLECYSTECTOMY     COLONOSCOPY WITH ESOPHAGOGASTRODUODENOSCOPY (EGD)  07/22/2022   CYSTOSCOPY N/A 12/27/2022   Procedure: CYSTOSCOPY;  Surgeon: Reinaldo Caras, MD;  Location: [facility];  Service: Gynecology;  Laterality: N/A;   CYSTOSCOPY W/ URETERAL STENT PLACEMENT Right 07/15/2021   Procedure: CYSTOSCOPY WITH RETROGRADE PYELOGRAM/URETERAL STENT PLACEMENT;  Surgeon: Florencio Hunting, MD;  Location: [facility];  Service: Urology;  Laterality: Right;   CYSTOSCOPY WITH RETROGRADE PYELOGRAM, URETEROSCOPY AND STENT PLACEMENT Right 05/03/2021   Procedure:  CYSTOSCOPY WITH RIGHT RETROGRADE PYELOGRAM, URETEROSCOPY;  Surgeon: Florencio Hunting, MD;  Location: [facility];  Service: Urology;  Laterality: Right;   DILATION AND CURETTAGE OF UTERUS N/A 12/31/2015   Procedure: SUCTION DILATATION AND CURETTAGE;  Surgeon: Raynell Caller, MD;  Location: [facility];  Service: Gynecology;  Laterality: N/A;   DILATION AND EVACUATION N/A 01/01/2016   Procedure: DILATATION AND EVACUATION;  Surgeon: Albino Hum, MD;  Location: [facility];  Service: Gynecology;  Laterality: N/A;   ESOPHAGOGASTRODUODENOSCOPY     GASTRIC BYPASS  06/02/2020   IR NEPHRO TUBE REMOV/FL  07/16/2021   IR NEPHROSTOMY EXCHANGE RIGHT  05/12/2021   IR NEPHROSTOMY PLACEMENT RIGHT  05/05/2021   ROBOT ASSISTED PYELOPLASTY Right 07/15/2021   Procedure: XI ROBOTIC ASSISTED  LAPAROSCOPIC PYELOPLASTY;  Surgeon: Florencio Hunting, MD;  Location: [facility];  Service: Urology;  Laterality: Right;    ROBOTIC ASSISTED LAPAROSCOPIC HYSTERECTOMY AND SALPINGECTOMY Bilateral 12/27/2022   Procedure: XI ROBOTIC ASSISTED LAPAROSCOPIC HYSTERECTOMY AND SALPINGECTOMY;  Surgeon: Reinaldo Caras, MD;  Location: [facility];  Service: Gynecology;  Laterality: Bilateral;   ROBOTIC ASSITED PARTIAL NEPHRECTOMY Right 03/15/2021   Procedure: XI ROBOTIC ASSITED PARTIAL NEPHRECTOMY;  Surgeon: Florencio Hunting, MD;  Location: [facility];  Service: Urology;  Laterality: Right;   TONSILLECTOMY     TUBAL LIGATION     UPPER GASTROINTESTINAL ENDOSCOPY     w/dilation    Social History: Social History   Socioeconomic History   Marital status: Married    Spouse name: Not on file   Number of children: 9   Years of education: 13   Highest education level: Associate degree: occupational, Scientist, product/process development, or vocational program  Occupational History   Occupation: Scientist, forensic  Tobacco Use   Smoking status: Never   Smokeless tobacco: Never  Vaping Use   Vaping status: Never Used  Substance and Sexual Activity   Alcohol use: Yes    Comment: occasionally   Drug use: Never   Sexual activity: Not Currently    Partners: Male    Birth control/protection: Surgical    Comment: tubal, hysterectomy  Other Topics Concern   Not on file  Social History Narrative   Not on file   Social Drivers of Health   Financial Resource Strain: Low Risk  (08/21/2022)   Overall Financial Resource Strain (CARDIA)    Difficulty of Paying Living Expenses: Not hard at all  Food Insecurity: No Food Insecurity (08/21/2022)   Hunger Vital Sign    Worried About Running Out of Food in the Last Year: Never true    Ran Out of Food in the Last Year: Never true  Transportation Needs: No Transportation Needs (08/21/2022)   PRAPARE - Administrator, Civil Service (Medical): No    Lack of Transportation (Non-Medical): No  Physical Activity: Insufficiently Active (08/21/2022)   Exercise Vital Sign    Days of  Exercise per Week: 3 days    Minutes of Exercise per Session: 40 min  Stress: Stress Concern Present (08/21/2022)   [employer] of Occupational Health - Occupational Stress Questionnaire    Feeling of Stress : To some extent  Social Connections: Socially Integrated (08/21/2022)   Social Connection and Isolation Panel [NHANES]    Frequency of Communication with Friends and Family: More than three times a week    Frequency of Social Gatherings with Friends and Family: More than three times a week    Attends Religious Services: 1 to 4 times per year  Active Member of Clubs or Organizations: Yes    Attends Banker Meetings: More than 4 times per year    Marital Status: Married  Catering manager Violence: Not At Risk (04/07/2023)   Humiliation, Afraid, Rape, and Kick questionnaire    Fear of Current or Ex-Partner: No    Emotionally Abused: No    Physically Abused: No    Sexually Abused: No    Family History: Family History  Problem Relation Age of Onset   Hypertension Mother    Thyroid  disease Mother    Cervical cancer Mother        dx 69s   Stomach cancer Maternal Grandmother        dx 65s   Leukemia Maternal Grandfather        d. 60s   Depression Paternal Grandmother    Cancer Paternal Grandfather        dx after 23; mets   Breast cancer Maternal Great-grandmother        dx <50; mets; MGF's mother   Stomach cancer Other        MGM's brother; dx after 7   Cancer Other        MGF's sisters x3; unknown primary   Colon cancer Neg Hx    Colon polyps Neg Hx    Esophageal cancer Neg Hx    Rectal cancer Neg Hx     Review of Systems: ROS  Physical Exam: Vital Signs LMP 12/04/2022 (Exact Date)   Physical Exam *** Constitutional:      General: Not in acute distress.    Appearance: Normal appearance. Not ill-appearing.  HENT:     Head: Normocephalic and atraumatic.  Eyes:     Pupils: Pupils are equal, round. Cardiovascular:     Rate and Rhythm: Normal  rate.    Pulses: Normal pulses.  Pulmonary:     Effort: No respiratory distress or increased work of breathing.  Speaks in full sentences. Abdominal:     General: Abdomen is flat. No distension.   Musculoskeletal: Normal range of motion. No lower extremity swelling or edema. No varicosities. *** Skin:    General: Skin is warm and dry.     Findings: No erythema or rash.  Neurological:     Mental Status: Alert and oriented to person, place, and time.  Psychiatric:        Mood and Affect: Mood normal.        Behavior: Behavior normal.    Assessment/Plan: The patient is scheduled for *** with Dr. Eudora Heron.  Risks, benefits, and alternatives of procedure discussed, questions answered and consent obtained.    Smoking Status: ***; Counseling Given? *** Last Mammogram: ***; Results: ***  Caprini Score: ***; Risk Factors include: ***, BMI *** 25, and length of planned surgery. Recommendation for mechanical *** pharmacological prophylaxis. Encourage early ambulation.   Pictures obtained: ***  Post-op Rx sent to pharmacy: ***  Patient was provided with the *** General Surgical Risk consent document and Pain Medication Agreement prior to their appointment.  They had adequate time to read through the risk consent documents and Pain Medication Agreement. We also discussed them in person together during this preop appointment. All of their questions were answered to their satisfaction.  Recommended calling if they have any further questions.  Risk consent form and Pain Medication Agreement to be scanned into patient's chart.  ***   Electronically signed by: Mariel Shope, PA-C 08/08/2023 4:07 PM

## 2023-08-09 ENCOUNTER — Ambulatory Visit (INDEPENDENT_AMBULATORY_CARE_PROVIDER_SITE_OTHER)

## 2023-08-09 ENCOUNTER — Ambulatory Visit: Payer: Self-pay | Admitting: Physician Assistant

## 2023-08-09 ENCOUNTER — Other Ambulatory Visit (HOSPITAL_BASED_OUTPATIENT_CLINIC_OR_DEPARTMENT_OTHER): Payer: Self-pay

## 2023-08-09 ENCOUNTER — Other Ambulatory Visit: Payer: Self-pay | Admitting: Physician Assistant

## 2023-08-09 ENCOUNTER — Ambulatory Visit (INDEPENDENT_AMBULATORY_CARE_PROVIDER_SITE_OTHER): Admitting: Physician Assistant

## 2023-08-09 VITALS — BP 134/88 | HR 92 | Ht 63.0 in | Wt 191.0 lb

## 2023-08-09 DIAGNOSIS — R1030 Lower abdominal pain, unspecified: Secondary | ICD-10-CM

## 2023-08-09 DIAGNOSIS — M793 Panniculitis, unspecified: Secondary | ICD-10-CM

## 2023-08-09 LAB — POCT URINALYSIS DIP (CLINITEK)
Blood, UA: NEGATIVE
Glucose, UA: NEGATIVE mg/dL
Nitrite, UA: NEGATIVE
Spec Grav, UA: >=1.03 — AB (ref 1.010–1.025)
Urobilinogen, UA: 0.2 U/dL
pH, UA: 6 (ref 5.0–8.0)

## 2023-08-09 MED ORDER — DOXYCYCLINE HYCLATE 100 MG PO TABS
100.0000 mg | ORAL_TABLET | Freq: Two times a day (BID) | ORAL | 0 refills | Status: DC
  Filled 2023-08-09: qty 20, 10d supply, fill #0

## 2023-08-09 MED ORDER — ONDANSETRON 4 MG PO TBDP
4.0000 mg | ORAL_TABLET | Freq: Three times a day (TID) | ORAL | 0 refills | Status: AC | PRN
  Filled 2023-08-09 – 2023-08-18 (×2): qty 20, 7d supply, fill #0

## 2023-08-09 MED ORDER — OXYCODONE HCL 5 MG PO TABS
5.0000 mg | ORAL_TABLET | Freq: Three times a day (TID) | ORAL | 0 refills | Status: AC | PRN
  Filled 2023-08-09 – 2023-08-18 (×2): qty 15, 5d supply, fill #0

## 2023-08-09 NOTE — Progress Notes (Signed)
 Patient is in office today for a nurse visit for Repeat UA. Patient UA was  positive for protein and positive for leukocytes and 1+ BIL.  Sending to PCP.

## 2023-08-10 LAB — URINE CULTURE

## 2023-08-10 NOTE — Progress Notes (Signed)
 [facility]  [address] [place],  [place]  [address] [phone]  Clinic Day:  08/11/2023  Referring physician: Cyndi Drain, PA-C  VIDEO VISIT  HISTORY OF PRESENT ILLNESS:  Michelle Lamb is a 33 y.o. female with iron  deficiency anemia secondary to both previously heavy menstrual cycles and a gastric bypass surgery.  She comes in today to reassess her iron  and hemoglobin levels after receiving IV iron  in March/April 2025.  Although she recently received IV iron , she has noticed herself to be more fatigued over the past few weeks.  She denies noticing any overt forms of blood loss.  Of note, she had her hysterectomy in October 2024.  Since then, she denies having any overt forms of blood loss.  VITALS:    Last menstrual period 12/04/2022.  Wt Readings from Last 3 Encounters:  08/09/23 191 lb (86.6 kg)  07/13/23 189 lb (85.7 kg)  07/03/23 193 lb 9.6 oz (87.8 kg)    There is no height or weight on file to calculate BMI.  Performance status (ECOG): 1 - Symptomatic but completely ambulatory  PHYSICAL EXAM:  Physical Exam Vitals and nursing note reviewed.  Constitutional:      General: She is not in acute distress.    Appearance: Normal appearance.  HENT:     Head: Normocephalic and atraumatic.     Mouth/Throat:     Mouth: Mucous membranes are moist.     Pharynx: Oropharynx is clear. No oropharyngeal exudate or posterior oropharyngeal erythema.   Eyes:     General: No scleral icterus.    Extraocular Movements: Extraocular movements intact.     Conjunctiva/sclera: Conjunctivae normal.     Pupils: Pupils are equal, round, and reactive to light.    Cardiovascular:     Rate and Rhythm: Normal rate and regular rhythm.     Heart sounds: Normal heart sounds. No murmur heard.    No friction rub. No gallop.  Pulmonary:     Effort: Pulmonary effort is normal.     Breath sounds: Normal breath sounds. No wheezing, rhonchi or rales.   Abdominal:     General: There is no distension.     Palpations: Abdomen is soft. There is no hepatomegaly, splenomegaly or mass.     Tenderness: There is no abdominal tenderness.   Musculoskeletal:        General: Normal range of motion.     Cervical back: Normal range of motion and neck supple. No tenderness.     Right lower leg: No edema.     Left lower leg: No edema.  Lymphadenopathy:     Cervical: No cervical adenopathy.     Upper Body:     Right upper body: No supraclavicular or axillary adenopathy.     Left upper body: No supraclavicular or axillary adenopathy.     Lower Body: No right inguinal adenopathy. No left inguinal adenopathy.   Skin:    General: Skin is warm and dry.     Coloration: Skin is not jaundiced.     Findings: No rash.   Neurological:     Mental Status: She is alert and oriented to person, place, and time.     Cranial Nerves: No cranial nerve deficit.   Psychiatric:        Mood and Affect: Mood normal.        Behavior: Behavior normal.        Thought Content: Thought content normal.    LABS:  Latest Ref Rng & Units 06/29/2023    7:32 AM 05/01/2023   12:03 PM 03/28/2023    1:17 PM  CBC  WBC 3.4 - 10.8 x10E3/uL 8.9  11.7  10.5   Hemoglobin 11.1 - 15.9 g/dL 47.4  25.9  56.3   Hematocrit 34.0 - 46.6 % 40.6  36.8  39.0   Platelets 150 - 450 x10E3/uL 300  385  355       Latest Ref Rng & Units 05/01/2023   12:03 PM 03/28/2023    1:17 PM 12/27/2022    6:21 AM  CMP  Glucose 70 - 99 mg/dL 74  80  75   BUN 6 - 20 mg/dL 12  15  16    Creatinine 0.57 - 1.00 mg/dL 8.75  6.43  3.29   Sodium 134 - 144 mmol/L 137  139  139   Potassium 3.5 - 5.2 mmol/L 4.5  4.3  3.9   Chloride 96 - 106 mmol/L 100  103  101   CO2 20 - 29 mmol/L 24  21    Calcium 8.7 - 10.2 mg/dL 9.2  9.3    Total Protein 6.0 - 8.5 g/dL 6.9  6.8    Total Bilirubin 0.0 - 1.2 mg/dL 0.5  0.6    Alkaline Phos 44 - 121 IU/L 91  99    AST 0 - 40 IU/L 28  40    ALT 0 - 32 IU/L 28  58       Latest Reference Range & Units 05/01/23 12:03 06/29/23 07:32  Iron  27 - 159 ug/dL 37 99  UIBC 518 - 841 ug/dL 660 630  TIBC 160 - 109 ug/dL 323 557  Ferritin 15 - 150 ng/mL 12 (L) 206 (H)  Iron  Saturation 15 - 55 % 9 (LL) 33  (LL): Data is critically low (L): Data is abnormally low (H): Data is abnormally high  ASSESSMENT & PLAN:  Assessment/Plan: A 33 y.o. female with iron  deficiency anemia secondary to previously heavy menstrual cycles and previous gastric bypass surgery.  Based upon her labs in May 2025, she did receive a benefit from her IV iron .  As she is more fatigued today, she knows she can get her labs rechecked at her place of employment and have the results sent to our office.  Otherwise, I will see her back in 4 months for repeat clinical assessment.  The patient understands all the plans discussed today and is in agreement with them.  Kaiah Hosea Felicia Horde, MD

## 2023-08-11 ENCOUNTER — Other Ambulatory Visit: Payer: Self-pay | Admitting: Physician Assistant

## 2023-08-11 ENCOUNTER — Inpatient Hospital Stay: Attending: Oncology | Admitting: Oncology

## 2023-08-11 ENCOUNTER — Other Ambulatory Visit: Payer: Self-pay | Admitting: Oncology

## 2023-08-11 ENCOUNTER — Inpatient Hospital Stay

## 2023-08-11 DIAGNOSIS — D500 Iron deficiency anemia secondary to blood loss (chronic): Secondary | ICD-10-CM

## 2023-08-11 DIAGNOSIS — D508 Other iron deficiency anemias: Secondary | ICD-10-CM | POA: Diagnosis not present

## 2023-08-11 DIAGNOSIS — N920 Excessive and frequent menstruation with regular cycle: Secondary | ICD-10-CM | POA: Diagnosis not present

## 2023-08-11 DIAGNOSIS — Z9884 Bariatric surgery status: Secondary | ICD-10-CM | POA: Diagnosis not present

## 2023-08-11 DIAGNOSIS — D513 Other dietary vitamin B12 deficiency anemia: Secondary | ICD-10-CM | POA: Diagnosis not present

## 2023-08-12 LAB — CBC WITH DIFFERENTIAL/PLATELET
Basophils Absolute: 0 10*3/uL (ref 0.0–0.2)
Basos: 0 %
EOS (ABSOLUTE): 0.2 10*3/uL (ref 0.0–0.4)
Eos: 3 %
Hematocrit: 42.6 % (ref 34.0–46.6)
Hemoglobin: 13.8 g/dL (ref 11.1–15.9)
Immature Grans (Abs): 0 10*3/uL (ref 0.0–0.1)
Immature Granulocytes: 0 %
Lymphocytes Absolute: 2.2 10*3/uL (ref 0.7–3.1)
Lymphs: 23 %
MCH: 29.4 pg (ref 26.6–33.0)
MCHC: 32.4 g/dL (ref 31.5–35.7)
MCV: 91 fL (ref 79–97)
Monocytes Absolute: 0.6 10*3/uL (ref 0.1–0.9)
Monocytes: 6 %
Neutrophils Absolute: 6.4 10*3/uL (ref 1.4–7.0)
Neutrophils: 68 %
Platelets: 307 10*3/uL (ref 150–450)
RBC: 4.7 x10E6/uL (ref 3.77–5.28)
RDW: 15.8 % — ABNORMAL HIGH (ref 11.7–15.4)
WBC: 9.4 10*3/uL (ref 3.4–10.8)

## 2023-08-12 LAB — IRON,TIBC AND FERRITIN PANEL
Ferritin: 187 ng/mL — ABNORMAL HIGH (ref 15–150)
Iron Saturation: 26 % (ref 15–55)
Iron: 80 ug/dL (ref 27–159)
Total Iron Binding Capacity: 308 ug/dL (ref 250–450)
UIBC: 228 ug/dL (ref 131–425)

## 2023-08-12 LAB — B12 AND FOLATE PANEL
Folate: 6.9 ng/mL (ref >3.0)
Vitamin B-12: 682 pg/mL (ref 232–1245)

## 2023-08-14 ENCOUNTER — Ambulatory Visit: Payer: Self-pay | Admitting: Physician Assistant

## 2023-08-17 ENCOUNTER — Encounter: Payer: Self-pay | Admitting: Physician Assistant

## 2023-08-17 ENCOUNTER — Encounter (HOSPITAL_BASED_OUTPATIENT_CLINIC_OR_DEPARTMENT_OTHER): Payer: Self-pay | Admitting: Plastic Surgery

## 2023-08-18 ENCOUNTER — Other Ambulatory Visit (HOSPITAL_BASED_OUTPATIENT_CLINIC_OR_DEPARTMENT_OTHER): Payer: Self-pay

## 2023-08-18 ENCOUNTER — Other Ambulatory Visit: Payer: Self-pay

## 2023-08-18 ENCOUNTER — Encounter (HOSPITAL_BASED_OUTPATIENT_CLINIC_OR_DEPARTMENT_OTHER): Payer: Self-pay | Admitting: Plastic Surgery

## 2023-08-23 NOTE — Progress Notes (Signed)
      Enhanced Recovery after Surgery Enhanced Recovery after Surgery is a protocol used to improve the stress on your body and your recovery after surgery.  Patient Instructions  The night before surgery:  No food after midnight. ONLY clear liquids after midnight  The day of surgery (if you do NOT have diabetes):  Drink ONE (1) Pre-Surgery Clear Ensure as directed.   This drink was given to you during your hospital  pre-op appointment visit. The pre-op nurse will instruct you on the time to drink the  Pre-Surgery Ensure depending on your surgery time. Finish the drink at the designated time by the pre-op nurse.  Nothing else to drink after completing the  Pre-Surgery Clear Ensure.  The day of surgery (if you have diabetes): Drink ONE (1) Gatorade 2 (G2) as directed. This drink was given to you during your hospital  pre-op appointment visit.  The pre-op nurse will instruct you on the time to drink the   Gatorade 2 (G2) depending on your surgery time. Color of the Gatorade may vary. Red is not allowed. Nothing else to drink after completing the  Gatorade 2 (G2).         If you have questions, please contact your surgeon's office.   Surgical soap given to patient with instructions for use.  Patient verbalized understanding of instructions.

## 2023-08-25 ENCOUNTER — Other Ambulatory Visit: Payer: Self-pay

## 2023-08-25 ENCOUNTER — Ambulatory Visit (HOSPITAL_BASED_OUTPATIENT_CLINIC_OR_DEPARTMENT_OTHER): Admitting: Anesthesiology

## 2023-08-25 ENCOUNTER — Encounter (HOSPITAL_BASED_OUTPATIENT_CLINIC_OR_DEPARTMENT_OTHER): Payer: Self-pay | Admitting: Plastic Surgery

## 2023-08-25 ENCOUNTER — Encounter (HOSPITAL_BASED_OUTPATIENT_CLINIC_OR_DEPARTMENT_OTHER)
Admission: RE | Disposition: A | Discharge status: Home / Self Care | Payer: Self-pay | Source: Home / Self Care | Attending: Plastic Surgery

## 2023-08-25 ENCOUNTER — Ambulatory Visit (HOSPITAL_BASED_OUTPATIENT_CLINIC_OR_DEPARTMENT_OTHER)
Admission: RE | Admit: 2023-08-25 | Discharge: 2023-08-25 | Disposition: A | Discharge status: Home / Self Care | Attending: Plastic Surgery | Admitting: Plastic Surgery

## 2023-08-25 DIAGNOSIS — G4733 Obstructive sleep apnea (adult) (pediatric): Secondary | ICD-10-CM | POA: Diagnosis not present

## 2023-08-25 DIAGNOSIS — Z9049 Acquired absence of other specified parts of digestive tract: Secondary | ICD-10-CM | POA: Diagnosis not present

## 2023-08-25 DIAGNOSIS — F32A Depression, unspecified: Secondary | ICD-10-CM | POA: Insufficient documentation

## 2023-08-25 DIAGNOSIS — K5909 Other constipation: Secondary | ICD-10-CM | POA: Insufficient documentation

## 2023-08-25 DIAGNOSIS — K219 Gastro-esophageal reflux disease without esophagitis: Secondary | ICD-10-CM | POA: Diagnosis not present

## 2023-08-25 DIAGNOSIS — Z905 Acquired absence of kidney: Secondary | ICD-10-CM | POA: Diagnosis not present

## 2023-08-25 DIAGNOSIS — M793 Panniculitis, unspecified: Secondary | ICD-10-CM

## 2023-08-25 DIAGNOSIS — J45909 Unspecified asthma, uncomplicated: Secondary | ICD-10-CM | POA: Diagnosis not present

## 2023-08-25 DIAGNOSIS — I129 Hypertensive chronic kidney disease with stage 1 through stage 4 chronic kidney disease, or unspecified chronic kidney disease: Secondary | ICD-10-CM | POA: Diagnosis not present

## 2023-08-25 DIAGNOSIS — Z79899 Other long term (current) drug therapy: Secondary | ICD-10-CM | POA: Insufficient documentation

## 2023-08-25 DIAGNOSIS — E1122 Type 2 diabetes mellitus with diabetic chronic kidney disease: Secondary | ICD-10-CM | POA: Insufficient documentation

## 2023-08-25 DIAGNOSIS — N189 Chronic kidney disease, unspecified: Secondary | ICD-10-CM | POA: Insufficient documentation

## 2023-08-25 DIAGNOSIS — F419 Anxiety disorder, unspecified: Secondary | ICD-10-CM | POA: Diagnosis not present

## 2023-08-25 DIAGNOSIS — Z9884 Bariatric surgery status: Secondary | ICD-10-CM | POA: Insufficient documentation

## 2023-08-25 DIAGNOSIS — Z01818 Encounter for other preprocedural examination: Secondary | ICD-10-CM

## 2023-08-25 HISTORY — DX: Unspecified osteoarthritis, unspecified site: M19.90

## 2023-08-25 HISTORY — PX: PANNICULECTOMY: SHX5360

## 2023-08-25 SURGERY — PANNICULECTOMY
Anesthesia: General

## 2023-08-25 MED ORDER — ONDANSETRON HCL 4 MG/2ML IJ SOLN
INTRAMUSCULAR | Status: AC
  Filled 2023-08-25: qty 2

## 2023-08-25 MED ORDER — BUPIVACAINE HCL (PF) 0.25 % IJ SOLN
INTRAMUSCULAR | Status: AC
  Filled 2023-08-25: qty 30

## 2023-08-25 MED ORDER — ROCURONIUM BROMIDE 10 MG/ML (PF) SYRINGE
PREFILLED_SYRINGE | INTRAVENOUS | Status: DC | PRN
  Administered 2023-08-25: 50 mg via INTRAVENOUS

## 2023-08-25 MED ORDER — PROPOFOL 10 MG/ML IV BOLUS
INTRAVENOUS | Status: AC
Start: 2023-08-25 — End: 2023-08-25
  Filled 2023-08-25: qty 20

## 2023-08-25 MED ORDER — CHLORHEXIDINE GLUCONATE CLOTH 2 % EX PADS: 6.0000 | MEDICATED_PAD | Freq: Once | CUTANEOUS | Status: DC

## 2023-08-25 MED ORDER — AMISULPRIDE (ANTIEMETIC) 5 MG/2ML IV SOLN: 10.0000 mg | Freq: Once | INTRAVENOUS | Status: DC | PRN

## 2023-08-25 MED ORDER — LACTATED RINGERS IV SOLN: INTRAVENOUS | Status: DC

## 2023-08-25 MED ORDER — SODIUM CHLORIDE (PF) 0.9 % IJ SOLN
INTRAMUSCULAR | Status: DC | PRN
  Administered 2023-08-25: 100 mL

## 2023-08-25 MED ORDER — FENTANYL CITRATE (PF) 100 MCG/2ML IJ SOLN
INTRAMUSCULAR | Status: AC
Start: 2023-08-25 — End: 2023-08-25
  Filled 2023-08-25: qty 2

## 2023-08-25 MED ORDER — 0.9 % SODIUM CHLORIDE (POUR BTL) OPTIME
TOPICAL | Status: DC | PRN
  Administered 2023-08-25 (×2): 1000 mL

## 2023-08-25 MED ORDER — FENTANYL CITRATE (PF) 100 MCG/2ML IJ SOLN
INTRAMUSCULAR | Status: AC
  Filled 2023-08-25: qty 2

## 2023-08-25 MED ORDER — LIDOCAINE 2% (20 MG/ML) 5 ML SYRINGE
INTRAMUSCULAR | Status: DC | PRN
  Administered 2023-08-25: 60 mg via INTRAVENOUS

## 2023-08-25 MED ORDER — FENTANYL CITRATE (PF) 100 MCG/2ML IJ SOLN
INTRAMUSCULAR | Status: DC | PRN
  Administered 2023-08-25 (×4): 50 ug via INTRAVENOUS

## 2023-08-25 MED ORDER — DEXAMETHASONE SODIUM PHOSPHATE 10 MG/ML IJ SOLN
INTRAMUSCULAR | Status: AC
  Filled 2023-08-25: qty 1

## 2023-08-25 MED ORDER — DEXAMETHASONE SODIUM PHOSPHATE 10 MG/ML IJ SOLN
INTRAMUSCULAR | Status: DC | PRN
  Administered 2023-08-25: 5 mg via INTRAVENOUS

## 2023-08-25 MED ORDER — CEFAZOLIN SODIUM-DEXTROSE 2-4 GM/100ML-% IV SOLN
INTRAVENOUS | Status: AC
  Filled 2023-08-25: qty 100

## 2023-08-25 MED ORDER — ONDANSETRON HCL 4 MG/2ML IJ SOLN
INTRAMUSCULAR | Status: DC | PRN
  Administered 2023-08-25: 4 mg via INTRAVENOUS

## 2023-08-25 MED ORDER — ACETAMINOPHEN 500 MG PO TABS
1000.0000 mg | ORAL_TABLET | Freq: Once | ORAL | Status: AC
  Administered 2023-08-25: 1000 mg via ORAL

## 2023-08-25 MED ORDER — DEXMEDETOMIDINE HCL IN NACL 80 MCG/20ML IV SOLN
INTRAVENOUS | Status: AC
  Filled 2023-08-25: qty 20

## 2023-08-25 MED ORDER — OXYCODONE HCL 5 MG PO TABS
5.0000 mg | ORAL_TABLET | Freq: Once | ORAL | Status: AC
  Administered 2023-08-25: 5 mg via ORAL

## 2023-08-25 MED ORDER — LIDOCAINE 2% (20 MG/ML) 5 ML SYRINGE
INTRAMUSCULAR | Status: AC
Start: 2023-08-25 — End: 2023-08-25
  Filled 2023-08-25: qty 5

## 2023-08-25 MED ORDER — PROPOFOL 10 MG/ML IV BOLUS
INTRAVENOUS | Status: DC | PRN
  Administered 2023-08-25: 150 mg via INTRAVENOUS

## 2023-08-25 MED ORDER — ACETAMINOPHEN 500 MG PO TABS
ORAL_TABLET | ORAL | Status: AC
  Filled 2023-08-25: qty 2

## 2023-08-25 MED ORDER — MIDAZOLAM HCL 2 MG/2ML IJ SOLN
INTRAMUSCULAR | Status: DC | PRN
  Administered 2023-08-25: 2 mg via INTRAVENOUS

## 2023-08-25 MED ORDER — OXYCODONE HCL 5 MG PO TABS
ORAL_TABLET | ORAL | Status: AC
  Filled 2023-08-25: qty 1

## 2023-08-25 MED ORDER — PROPOFOL 10 MG/ML IV BOLUS
INTRAVENOUS | Status: AC
  Filled 2023-08-25: qty 20

## 2023-08-25 MED ORDER — CEFAZOLIN SODIUM-DEXTROSE 2-4 GM/100ML-% IV SOLN
2.0000 g | INTRAVENOUS | Status: AC
Start: 2023-08-25 — End: 2023-08-25
  Administered 2023-08-25: 2 g via INTRAVENOUS

## 2023-08-25 MED ORDER — FENTANYL CITRATE (PF) 100 MCG/2ML IJ SOLN
25.0000 ug | INTRAMUSCULAR | Status: DC | PRN
  Administered 2023-08-25 (×3): 50 ug via INTRAVENOUS

## 2023-08-25 MED ORDER — SODIUM CHLORIDE (PF) 0.9 % IJ SOLN
INTRAMUSCULAR | Status: AC
Start: 2023-08-25 — End: 2023-08-25
  Filled 2023-08-25: qty 50

## 2023-08-25 MED ORDER — SUGAMMADEX SODIUM 200 MG/2ML IV SOLN
INTRAVENOUS | Status: DC | PRN
  Administered 2023-08-25: 200 mg via INTRAVENOUS

## 2023-08-25 MED ORDER — ROCURONIUM BROMIDE 10 MG/ML (PF) SYRINGE
PREFILLED_SYRINGE | INTRAVENOUS | Status: AC
  Filled 2023-08-25: qty 10

## 2023-08-25 MED ORDER — MIDAZOLAM HCL 2 MG/2ML IJ SOLN
INTRAMUSCULAR | Status: AC
  Filled 2023-08-25: qty 2

## 2023-08-25 MED ORDER — BUPIVACAINE LIPOSOME 1.3 % IJ SUSP
INTRAMUSCULAR | Status: AC
  Filled 2023-08-25: qty 20

## 2023-08-25 MED ORDER — DEXMEDETOMIDINE HCL IN NACL 80 MCG/20ML IV SOLN
INTRAVENOUS | Status: DC | PRN
  Administered 2023-08-25: 12 ug via INTRAVENOUS

## 2023-08-25 SURGICAL SUPPLY — 55 items
BINDER ABDOMINAL 10 UNV 27-48 (MISCELLANEOUS) IMPLANT
BINDER ABDOMINAL 12 ML 46-62 (SOFTGOODS) IMPLANT
BINDER ABDOMINAL 12 SM 30-45 (SOFTGOODS) IMPLANT
BIOPATCH RED 1 DISK 7.0 (GAUZE/BANDAGES/DRESSINGS) ×2 IMPLANT
BLADE CLIPPER SURG (BLADE) IMPLANT
BLADE SURG 10 STRL SS (BLADE) ×4 IMPLANT
BLADE SURG 11 STRL SS (BLADE) IMPLANT
BLADE SURG 15 STRL LF DISP TIS (BLADE) ×1 IMPLANT
CANISTER SUCT 1200ML W/VALVE (MISCELLANEOUS) ×1 IMPLANT
CLIP APPLIE 9.375 MED OPEN (MISCELLANEOUS) IMPLANT
DERMABOND ADVANCED .7 DNX12 (GAUZE/BANDAGES/DRESSINGS) ×2 IMPLANT
DRAIN CHANNEL 19F RND (DRAIN) ×2 IMPLANT
DRAPE UTILITY XL STRL (DRAPES) ×1 IMPLANT
DRSG TEGADERM 4X4.75 (GAUZE/BANDAGES/DRESSINGS) ×2 IMPLANT
ELECT COATED BLADE 2.86 ST (ELECTRODE) IMPLANT
ELECTRODE BLDE 4.0 EZ CLN MEGD (MISCELLANEOUS) ×1 IMPLANT
ELECTRODE REM PT RTRN 9FT ADLT (ELECTROSURGICAL) ×2 IMPLANT
EVACUATOR SILICONE 100CC (DRAIN) ×2 IMPLANT
GAUZE PAD ABD 8X10 STRL (GAUZE/BANDAGES/DRESSINGS) ×2 IMPLANT
GAUZE SPONGE 2X2 STRL 8-PLY (GAUZE/BANDAGES/DRESSINGS) ×2 IMPLANT
GLOVE BIO SURGEON STRL SZ 6.5 (GLOVE) IMPLANT
GLOVE BIO SURGEON STRL SZ7.5 (GLOVE) ×1 IMPLANT
GLOVE BIO SURGEON STRL SZ8 (GLOVE) ×2 IMPLANT
GLOVE BIOGEL PI IND STRL 7.0 (GLOVE) IMPLANT
GLOVE BIOGEL PI IND STRL 8 (GLOVE) ×2 IMPLANT
GOWN STRL REUS W/ TWL LRG LVL3 (GOWN DISPOSABLE) ×1 IMPLANT
GOWN STRL REUS W/TWL XL LVL3 (GOWN DISPOSABLE) ×2 IMPLANT
HEMOSTAT ARISTA ABSORB 3G PWDR (HEMOSTASIS) IMPLANT
HIBICLENS CHG 4% 4OZ BTL (MISCELLANEOUS) ×1 IMPLANT
NDL HYPO 22X1.5 SAFETY MO (MISCELLANEOUS) ×2 IMPLANT
NEEDLE HYPO 22X1.5 SAFETY MO (MISCELLANEOUS) ×2 IMPLANT
NS IRRIG 1000ML POUR BTL (IV SOLUTION) ×1 IMPLANT
PACK BASIN DAY SURGERY FS (CUSTOM PROCEDURE TRAY) ×1 IMPLANT
PACK UNIVERSAL I (CUSTOM PROCEDURE TRAY) ×1 IMPLANT
PENCIL SMOKE EVACUATOR (MISCELLANEOUS) ×2 IMPLANT
PIN SAFETY STERILE (MISCELLANEOUS) ×1 IMPLANT
SLEEVE SCD COMPRESS KNEE MED (STOCKING) ×1 IMPLANT
SPONGE T-LAP 18X18 ~~LOC~~+RFID (SPONGE) ×2 IMPLANT
STAPLER INSORB 30 2030 C-SECTI (MISCELLANEOUS) IMPLANT
STAPLER SKIN PROX WIDE 3.9 (STAPLE) ×1 IMPLANT
SUT MNCRL AB 3-0 PS2 27 (SUTURE) ×4 IMPLANT
SUT MNCRL AB 4-0 PS2 18 (SUTURE) ×4 IMPLANT
SUT SILK 2 0 PERMA HAND 18 BK (SUTURE) ×2 IMPLANT
SUT VIC AB 2-0 CT1 TAPERPNT 27 (SUTURE) ×2 IMPLANT
SUT VIC AB 3-0 PS1 18XBRD (SUTURE) IMPLANT
SUT VIC AB 3-0 SH 27X BRD (SUTURE) ×1 IMPLANT
SUT VICRYL AB 3 0 TIES (SUTURE) IMPLANT
SYR 20ML LL LF (SYRINGE) ×2 IMPLANT
SYR BULB IRRIG 60ML STRL (SYRINGE) ×1 IMPLANT
SYR CONTROL 10ML LL (SYRINGE) ×1 IMPLANT
TOWEL GREEN STERILE FF (TOWEL DISPOSABLE) ×2 IMPLANT
TRAY DSU PREP LF (CUSTOM PROCEDURE TRAY) ×1 IMPLANT
TUBE CONNECTING 20X1/4 (TUBING) ×1 IMPLANT
UNDERPAD 30X36 HEAVY ABSORB (UNDERPADS AND DIAPERS) ×2 IMPLANT
YANKAUER SUCT BULB TIP NO VENT (SUCTIONS) ×1 IMPLANT

## 2023-08-25 NOTE — Anesthesia Preprocedure Evaluation (Addendum)
 Anesthesia Evaluation  Patient identified by MRN, date of birth, ID band Patient awake    Reviewed: Allergy & Precautions, NPO status , Patient's Chart, lab work & pertinent test results  Airway Mallampati: II  TM Distance: >3 FB Neck ROM: Full    Dental no notable dental hx.    Pulmonary asthma , sleep apnea    Pulmonary exam normal        Cardiovascular hypertension, Normal cardiovascular exam     Neuro/Psych  Headaches PSYCHIATRIC DISORDERS Anxiety Depression       GI/Hepatic Neg liver ROS,GERD  Medicated and Controlled,,  Endo/Other  diabetes    Renal/GU negative Renal ROS     Musculoskeletal  (+) Arthritis ,    Abdominal  (+) + obese  Peds  Hematology negative hematology ROS (+)   Anesthesia Other Findings Panniculitis   Reproductive/Obstetrics                             Anesthesia Physical Anesthesia Plan  ASA: 2  Anesthesia Plan: General   Post-op Pain Management:    Induction: Intravenous  PONV Risk Score and Plan: 3 and Ondansetron , Dexamethasone , Midazolam  and Treatment may vary due to age or medical condition  Airway Management Planned: Oral ETT  Additional Equipment:   Intra-op Plan:   Post-operative Plan: Extubation in OR  Informed Consent: I have reviewed the patients History and Physical, chart, labs and discussed the procedure including the risks, benefits and alternatives for the proposed anesthesia with the patient or authorized representative who has indicated his/her understanding and acceptance.     Dental advisory given  Plan Discussed with: CRNA  Anesthesia Plan Comments:        Anesthesia Quick Evaluation

## 2023-08-25 NOTE — Transfer of Care (Signed)
 Immediate Anesthesia Transfer of Care Note  Patient: Michelle Lamb  Procedure(s) Performed: Procedure(s) (LRB): PANNICULECTOMY (N/A)  Patient Location: PACU  Anesthesia Type: General  Level of Consciousness: awake, oriented, sedated and patient cooperative  Airway & Oxygen Therapy: Patient Spontanous Breathing and Patient connected to face mask oxygen  Post-op Assessment: Report given to PACU RN and Post -op Vital signs reviewed and stable  Post vital signs: Reviewed and stable  Complications: No apparent anesthesia complications Last Vitals:  Vitals Value Taken Time  BP 145/83 08/25/23 09:56  Temp 36.2 C 08/25/23 09:56  Pulse 74 08/25/23 09:58  Resp 14 08/25/23 09:58  SpO2 95 % 08/25/23 09:58  Vitals shown include unfiled device data.  Last Pain:  Vitals:   08/25/23 9367  TempSrc: Temporal  PainSc: 0-No pain      Patients Stated Pain Goal: 4 (08/25/23 9367)  Complications: No notable events documented.

## 2023-08-25 NOTE — Interval H&P Note (Signed)
 History and Physical Interval Note: No change in exam or indication for surgery All questions answered Marked for a panniculectomy with her assistance Will proceed at her request   08/25/2023 7:02 AM  Michelle Lamb  has presented today for surgery, with the diagnosis of m79.3.  The various methods of treatment have been discussed with the patient and family. After consideration of risks, benefits and other options for treatment, the patient has consented to  Procedure(s): PANNICULECTOMY (N/A) as a surgical intervention.  The patient's history has been reviewed, patient examined, no change in status, stable for surgery.  I have reviewed the patient's chart and labs.  Questions were answered to the patient's satisfaction.     Leonce KATHEE [place]

## 2023-08-25 NOTE — Anesthesia Procedure Notes (Signed)
 Procedure Name: Intubation Date/Time: 08/25/2023 7:35 AM  Performed by: Delayne Olam BIRCH, CRNAPre-anesthesia Checklist: Patient identified, Emergency Drugs available, Suction available and Patient being monitored Patient Re-evaluated:Patient Re-evaluated prior to induction Oxygen Delivery Method: Circle system utilized Preoxygenation: Pre-oxygenation with 100% oxygen Induction Type: IV induction Ventilation: Mask ventilation without difficulty Laryngoscope Size: Mac and 4 Grade View: Grade I Tube type: Oral Tube size: 7.0 mm Number of attempts: 1 Airway Equipment and Method: Stylet and Oral airway Placement Confirmation: ETT inserted through vocal cords under direct vision, positive ETCO2 and breath sounds checked- equal and bilateral Secured at: 22 cm Tube secured with: Tape Dental Injury: Teeth and Oropharynx as per pre-operative assessment

## 2023-08-25 NOTE — Discharge Instructions (Addendum)
 Activity As tolerated: NO showers until 2 days after surgery. Keep binder on 24/7 unless showering or changing dressings. This is important to prevent additional swelling.  NO driving while in pain, taking pain medication or if you are unable to safely react to traffic. No heavy activities. No lifting > 15 pounds. No abdominal or core exercise until incision is well-healed.  Recommend that you sleep with a slight flexion at your hips. You can place a pillow underneath your knees as well as behind your back to take pressure off of the incision.  Please take Tylenol  500 mg every 6 hours for pain control. If you can take ibuprofen (NSAIDs), please take them additionally, as per the instructions on the bottle.  Take the oxycodone  only as needed for severe pain.   Diet: Regular. Drink plenty of fluids (water , avoid juice/soda) and eat healthy, high protein, low carbs.  Wound Care: Keep dressing clean & dry. You may change bandages after showering if you continue to notice some drainage.  Special Instructions: Call Doctor if any unusual problems occur such as pain, excessive Bleeding, unrelieved Nausea/vomiting, Fever &/or chills  Drains: Measure drain output from drains every 24 hours. Record this on a log for us  to view during post-op appointments. Drainage will change colors over the next week to two weeks. This is normal. Color of drainage can vary from red-pink-orange-yellow.  Follow-up appointment: As scheduled.      Post Anesthesia Home Care Instructions  Activity: Get plenty of rest for the remainder of the day. A responsible individual must stay with you for 24 hours following the procedure.  For the next 24 hours, DO NOT: -Drive a car -Advertising copywriter -Drink alcoholic beverages -Take any medication unless instructed by your physician -Make any legal decisions or sign important papers.  Meals: Start with liquid foods such as gelatin or soup. Progress to regular foods as  tolerated. Avoid greasy, spicy, heavy foods. If nausea and/or vomiting occur, drink only clear liquids until the nausea and/or vomiting subsides. Call your physician if vomiting continues.  Special Instructions/Symptoms: Your throat may feel dry or sore from the anesthesia or the breathing tube placed in your throat during surgery. If this causes discomfort, gargle with warm salt water . The discomfort should disappear within 24 hours.  If you had a scopolamine  patch placed behind your ear for the management of post- operative nausea and/or vomiting:  1. The medication in the patch is effective for 72 hours, after which it should be removed.  Wrap patch in a tissue and discard in the trash. Wash hands thoroughly with soap and water . 2. You may remove the patch earlier than 72 hours if you experience unpleasant side effects which may include dry mouth, dizziness or visual disturbances. 3. Avoid touching the patch. Wash your hands with soap and water  after contact with the patch.     Information for Discharge Teaching: EXPAREL  (bupivacaine  liposome injectable suspension)   Pain relief is important to your recovery. The goal is to control your pain so you can move easier and return to your normal activities as soon as possible after your procedure. Your physician may use several types of medicines to manage pain, swelling, and more.  Your surgeon or anesthesiologist gave you EXPAREL (bupivacaine ) to help control your pain after surgery.  EXPAREL  is a local anesthetic designed to release slowly over an extended period of time to provide pain relief by numbing the tissue around the surgical site. EXPAREL  is designed to release pain medication  over time and can control pain for up to 72 hours. Depending on how you respond to EXPAREL , you may require less pain medication during your recovery. EXPAREL  can help reduce or eliminate the need for opioids during the first few days after surgery when pain  relief is needed the most. EXPAREL  is not an opioid and is not addictive. It does not cause sleepiness or sedation.   Important! A teal colored band has been placed on your arm with the date, time and amount of EXPAREL  you have received. Please leave this armband in place for the full 96 hours following administration, and then you may remove the band. If you return to the hospital for any reason within 96 hours following the administration of EXPAREL , the armband provides important information that your health care providers to know, and alerts them that you have received this anesthetic.    Possible side effects of EXPAREL : Temporary loss of sensation or ability to move in the area where medication was injected. Nausea, vomiting, constipation Rarely, numbness and tingling in your mouth or lips, lightheadedness, or anxiety may occur. Call your doctor right away if you think you may be experiencing any of these sensations, or if you have other questions regarding possible side effects.  Follow all other discharge instructions given to you by your surgeon or nurse. Eat a healthy diet and drink plenty of water  or other fluids.   About my Jackson-Pratt Bulb Drain  What is a Jackson-Pratt bulb? A Jackson-Pratt is a soft, round device used to collect drainage. It is connected to a long, thin drainage catheter, which is held in place by one or two small stiches near your surgical incision site. When the bulb is squeezed, it forms a vacuum, forcing the drainage to empty into the bulb.  Emptying the Jackson-Pratt bulb- To empty the bulb: 1. Release the plug on the top of the bulb. 2. Pour the bulb's contents into a measuring container which your nurse will provide. 3. Record the time emptied and amount of drainage. Empty the drain(s) as often as your     doctor or nurse recommends.  Date                  Time                    Amount (Drain 1)                 Amount (Drain  2)  _____________________________________________________________________  _____________________________________________________________________  _____________________________________________________________________  _____________________________________________________________________  _____________________________________________________________________  _____________________________________________________________________  _____________________________________________________________________  _____________________________________________________________________  Squeezing the Jackson-Pratt Bulb- To squeeze the bulb: 1. Make sure the plug at the top of the bulb is open. 2. Squeeze the bulb tightly in your fist. You will hear air squeezing from the bulb. 3. Replace the plug while the bulb is squeezed. 4. Use a safety pin to attach the bulb to your clothing. This will keep the catheter from     pulling at the bulb insertion site.  When to call your doctor- Call your doctor if: Drain site becomes red, swollen or hot. You have a fever greater than 101 degrees F. There is oozing at the drain site. Drain falls out (apply a guaze bandage over the drain hole and secure it with tape). Drainage increases daily not related to activity patterns. (You will usually have more drainage when you are active than when you are resting.) Drainage has a bad odor.    Next dose  of Tylenol  may be given at 1pm if needed.

## 2023-08-25 NOTE — Op Note (Signed)
 DATE OF OPERATION: 08/25/2023  LOCATION: [facility] operating Room  PREOPERATIVE DIAGNOSIS: Panniculitis  POSTOPERATIVE DIAGNOSIS: Same  PROCEDURE: Panniculectomy  SURGEON: Marinell Birmingham, MD  ASSISTANT: Honora Seip  EBL: 75 cc  CONDITION: Stable  COMPLICATIONS: None  INDICATION: The patient, Michelle Lamb, is a 32 y.o. female born on September 29, 1990, is here for treatment ongoing rashes on posterior aspect of the pannus.[name]   PROCEDURE DETAILS:  The patient was seen prior to surgery and marked.  The IV antibiotics were given. The patient was taken to the operating room and given a general anesthetic. A standard time out was performed and all information was confirmed by those in the room. SCDs were placed.   The abdomen was prepped and draped in usual sterile manner.  A low transverse incision was made across the abdomen sharply and the electrocautery was used to dissected the soft tissues down to the anterior abdominal wall fascia.  Skin and fat were dissected away from the anterior abdominal wall fascia in a caudad to cranial manner to the level of the symphysis pubis.  An appropriate point for the counterincision was identified and the pannus was excised sharply.  Meticulous hemostasis was achieved with the electrocautery.  Wound was irrigated with warm normal saline.  100 mL of a mixture of Exparel , quarter percent plain Marcaine , saline was injected into the subfascial space and subcutaneous tissues.  2-19 [place] round drains were placed in the surgical bed and brought out through separate stab incisions.  Skin edges were tailor tacked in place with skin clips and Scarpa's fascia approximated with interrupted 2-0 Vicryl sutures.  The dermis was closed with interrupted and running 3-0 Monocryl sutures and skin was closed with running 4-0 Monocryl subcuticular stitch.  The incisions were sealed with Dermabond and the patient was placed in a compressive garment.  She was awakened from  anesthesia without incident transferred to the recovery room in good condition.  All instrument needle and sponge counts were reported as correct.  No complications were appreciated during the procedure. The patient was allowed to wake up and taken to recovery room in stable condition at the end of the case. The family was notified at the end of the case.   The advanced practice practitioner (APP) assisted throughout the case.  The APP was essential in retraction and counter traction when needed to make the case progress smoothly.  This retraction and assistance made it possible to see the tissue plans for the procedure.  The assistance was needed for blood control, tissue re-approximation and assisted with closure of the incision site.

## 2023-08-26 NOTE — Anesthesia Postprocedure Evaluation (Signed)
 Anesthesia Post Note  Patient: Carin Latifahna Perrot  Procedure(s) Performed: PANNICULECTOMY     Patient location during evaluation: PACU Anesthesia Type: General Level of consciousness: awake Pain management: pain level controlled Vital Signs Assessment: post-procedure vital signs reviewed and stable Respiratory status: spontaneous breathing, nonlabored ventilation and respiratory function stable Cardiovascular status: blood pressure returned to baseline and stable Postop Assessment: no apparent nausea or vomiting Anesthetic complications: no   No notable events documented.  Last Vitals:  Vitals:   08/25/23 1030 08/25/23 1056  BP: 126/82 (!) 142/82  Pulse: 70 76  Resp: 13 16  Temp:  (!) 36.2 C  SpO2: 95% 96%    Last Pain:  Vitals:   08/25/23 1056  TempSrc:   PainSc: 8                  Parnell Spieler P Markitta Ausburn

## 2023-08-27 ENCOUNTER — Encounter (HOSPITAL_BASED_OUTPATIENT_CLINIC_OR_DEPARTMENT_OTHER): Payer: Self-pay | Admitting: Plastic Surgery

## 2023-08-28 ENCOUNTER — Other Ambulatory Visit (HOSPITAL_BASED_OUTPATIENT_CLINIC_OR_DEPARTMENT_OTHER): Payer: Self-pay

## 2023-08-28 NOTE — Progress Notes (Unsigned)
 Patient is a pleasant 33 year old female s/p panniculectomy performed 08/25/2023 by Dr. Waddell who presents to clinic for postoperative follow-up.  Today, patient reports that she is nearly out of her oxycodone .  She is 4 days postop from surgery and only 5 days (#15) of narcotics were prescribed.  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  She is accompanied by her husband at bedside.  She tells me that her pain is worse when she is getting up and moving, particularly when she gets up from lying position.  She also reports that over the weekend she had some right sided chest/back discomfort that she attributed to gas which she states has since resolved.  She is ambulatory, tolerating p.o. intake without difficulty.  Voiding urine and BM.  Denies any leg swelling.  Drain output has been minimal in the left, approximately 15 cc or less per day.  Output on the left side has been slightly higher, but less than 35 cc/day for the past couple of days.  On exam, abdomen is soft and nondistended.  Generalized TTP, but no palpable underlying fluid collections.  Appropriately inflamed given recency since surgery.  Incision CDI throughout.  Mons mildly swollen, but easily reducible and no large seroma.  Drains are intact and functional with sanguinous output in bulbs bilaterally.  No significant bruising to the skin.  No leg swelling.  While the output is sanguinous, volume has been awfully light since surgery.  Drains are intact and functional.  Part of it is likely that she is not up to normal activity given her pain symptoms, we will hold off on removing any drain at this time.  However, she does return in a few days and assuming her activity is up she may be amenable to removal of right drain.  She states the tape was a bit bothersome and has been removed and replaced with silicone bordered dressings.  Will prescribe her  another few days of oxycodone , but she understands that at that time it will need to be Tylenol  alone.  She will try to make them last as long as possible.  Patient is relieved by reassuring exam today.  She understands to call the office should she develop any new or worsening symptoms, including any recurrence in chest pain.

## 2023-08-29 ENCOUNTER — Ambulatory Visit (INDEPENDENT_AMBULATORY_CARE_PROVIDER_SITE_OTHER): Admitting: Physician Assistant

## 2023-08-29 ENCOUNTER — Other Ambulatory Visit (HOSPITAL_BASED_OUTPATIENT_CLINIC_OR_DEPARTMENT_OTHER): Payer: Self-pay

## 2023-08-29 VITALS — BP 111/68 | HR 91

## 2023-08-29 DIAGNOSIS — Z9889 Other specified postprocedural states: Secondary | ICD-10-CM

## 2023-08-29 MED ORDER — OXYCODONE HCL 5 MG PO TABS
5.0000 mg | ORAL_TABLET | Freq: Three times a day (TID) | ORAL | 0 refills | Status: AC | PRN
  Filled 2023-08-29: qty 9, 3d supply, fill #0

## 2023-08-31 ENCOUNTER — Ambulatory Visit: Admitting: Plastic Surgery

## 2023-08-31 DIAGNOSIS — Z9889 Other specified postprocedural states: Secondary | ICD-10-CM

## 2023-08-31 NOTE — Progress Notes (Signed)
 Michelle Lamb returns today 6 days postop from a panniculectomy.  She is starting to feel better.  She showers yesterday and notes that her activity level is starting to increase.  She is having bowel movements and is tolerating a diet without difficulty.  Drain output as noted on the first is a little more bloody than normally expected but this is clearly serosanguineous.  The right drain is no longer putting out anything.  The right drain is removed today without difficulty.  I discussed with the patient and her husband of the levels that we would like to see for the next drain to come out.  They understand this is 30 mL or less over 24 hours.  Once they have reached that level of admission: To have the drain removed.  Also keep scheduled appointment on July 16.

## 2023-09-04 NOTE — Progress Notes (Unsigned)
 33 year old female here for follow-up after panniculectomy on 08/25/2023.  She is just over 1 week postop. She is here today for evaluation of left JP drain fluid.  She reports that she noticed greasy appearing drainage in the bulb and was concerned about this.  She is otherwise feeling well without any issues.  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  Chaperone present on exam On exam abdominal incision is intact, healing well.  Left JP drain is in place with dark sanguinous fluid in bulb with some mixed oily appearing drainage.  JP drain insertion site without any concern on exam.  No obvious subcutaneous fluid collection noted with palpation. She continues to have some mild swelling of her mons pubis, but no tenderness, firmness or fluid collections with palpation noted.   A/P:  Patient is overall doing well, continue to wear compressive garments and avoid strenuous activities or heavy lifting greater than 20 pounds.  She can return to work with restrictions.  Recommend following up next week for reevaluation and possible drain removal. She continues to have too much output and the drainage is too dark at this point for consideration of removal.  Recommend calling with questions or concerns.

## 2023-09-05 ENCOUNTER — Encounter: Payer: Self-pay | Admitting: Surgical

## 2023-09-05 ENCOUNTER — Ambulatory Visit: Admitting: Surgical

## 2023-09-05 VITALS — BP 124/83 | HR 88

## 2023-09-05 DIAGNOSIS — Z9889 Other specified postprocedural states: Secondary | ICD-10-CM

## 2023-09-05 DIAGNOSIS — M793 Panniculitis, unspecified: Secondary | ICD-10-CM

## 2023-09-07 ENCOUNTER — Other Ambulatory Visit (HOSPITAL_BASED_OUTPATIENT_CLINIC_OR_DEPARTMENT_OTHER): Payer: Self-pay

## 2023-09-07 MED ORDER — SODIUM FLUORIDE 5000 PPM 1.1 % DT PSTE
PASTE | DENTAL | 5 refills | Status: AC
  Filled 2023-09-07: qty 100, 30d supply, fill #0
  Filled 2023-11-10: qty 100, 30d supply, fill #1
  Filled 2023-12-22: qty 100, 30d supply, fill #2
  Filled 2024-03-08: qty 100, 30d supply, fill #3

## 2023-09-11 ENCOUNTER — Ambulatory Visit: Admitting: Student

## 2023-09-11 ENCOUNTER — Ambulatory Visit: Payer: Self-pay | Admitting: Physician Assistant

## 2023-09-11 ENCOUNTER — Encounter: Payer: Self-pay | Admitting: Student

## 2023-09-11 ENCOUNTER — Other Ambulatory Visit: Payer: Self-pay

## 2023-09-11 ENCOUNTER — Other Ambulatory Visit: Payer: Self-pay | Admitting: Physician Assistant

## 2023-09-11 ENCOUNTER — Ambulatory Visit (INDEPENDENT_AMBULATORY_CARE_PROVIDER_SITE_OTHER)

## 2023-09-11 ENCOUNTER — Other Ambulatory Visit (HOSPITAL_BASED_OUTPATIENT_CLINIC_OR_DEPARTMENT_OTHER): Payer: Self-pay

## 2023-09-11 VITALS — BP 133/85 | HR 89

## 2023-09-11 DIAGNOSIS — M549 Dorsalgia, unspecified: Secondary | ICD-10-CM | POA: Diagnosis not present

## 2023-09-11 DIAGNOSIS — Z9889 Other specified postprocedural states: Secondary | ICD-10-CM

## 2023-09-11 DIAGNOSIS — R41840 Attention and concentration deficit: Secondary | ICD-10-CM

## 2023-09-11 LAB — POCT URINALYSIS DIP (CLINITEK)
Blood, UA: NEGATIVE
Glucose, UA: NEGATIVE mg/dL
Ketones, POC UA: NEGATIVE mg/dL
Nitrite, UA: POSITIVE — AB
POC PROTEIN,UA: NEGATIVE
Spec Grav, UA: >=1.03 — AB (ref 1.010–1.025)
Urobilinogen, UA: 0.2 U/dL
pH, UA: 6 (ref 5.0–8.0)

## 2023-09-11 MED ORDER — LISDEXAMFETAMINE DIMESYLATE 70 MG PO CAPS
70.0000 mg | ORAL_CAPSULE | Freq: Every day | ORAL | 0 refills | Status: DC
Start: 2023-09-11 — End: 2023-10-09
  Filled 2023-09-11: qty 30, 30d supply, fill #0

## 2023-09-11 MED ORDER — CIPROFLOXACIN HCL 500 MG PO TABS
500.0000 mg | ORAL_TABLET | Freq: Two times a day (BID) | ORAL | 0 refills | Status: AC
  Filled 2023-09-11: qty 6, 3d supply, fill #0

## 2023-09-11 NOTE — Progress Notes (Signed)
 Patient is a 33 year old female who recently underwent panniculectomy with Dr. Waddell on 08/25/2023.  Patient is a little over 2 weeks postop.  She presents to the clinic today with concerns about her drain.  Patient was last seen in the clinic on 09/05/2023.  At this visit, patient was overall doing well.  On exam, abdominal incision was intact and healing well.  The left JP drain was in place with dark sanguinous fluid in the bulb with some mixed oily appearing drainage.  Patient overall is doing well.  Recommended that she follow-up in a week for reevaluation and possible drain removal.  Today, patient reports she is overall doing well.  She states that last night, she noticed her left JP drain bulb was inflated and would not stay compressed.  She states that she then noticed some drainage around the drain site.  She states that leading up to the JP drain not functioning properly, her drain was putting out about 30 to 40 cc/day.  She denies any fevers or chills.  She denies any other issues or concerns at this time.  Chaperone present on exam.  On exam, patient is sitting upright in no acute distress.  Abdomen is soft and nontender.  There is no overlying erythema.  No obvious fluid collection on exam.  Incision appears to be clean dry and intact.  There is no surrounding erythema.  Left drain tube is in place.  Bulb has been removed by the patient.  At the drain site, black dot is several centimeters out from the insertion site.  Channels within the drain are noted to be outside of the insertion site.  There are no signs of infection on exam.  The left JP drain insertion site and tube was cleaned with Vashe.  JP drain was pushed back in through the insertion site and a new bulb was attached to the end of the tubing.  Bulb compression was achieved.  The tubing was secured in place with Medipore and Mepilex dressings.  I discussed with the patient that I would recommend her keeping the drain and tubing in  place as it is now functioning and her drain output is still a little bit high.  I did discuss with her the risks of premature drain removal.  Patient expressed understanding.  Discussed with her the importance of wearing compression at all times.  She expressed understanding.  Recommended that she continue to apply Vaseline to her incision daily.  Patient states that she has an appointment later this week, recommended that she keep that appointment.  Patient to call if she has any questions or concerns about anything.

## 2023-09-12 ENCOUNTER — Telehealth: Payer: Self-pay

## 2023-09-12 ENCOUNTER — Other Ambulatory Visit (HOSPITAL_BASED_OUTPATIENT_CLINIC_OR_DEPARTMENT_OTHER): Payer: Self-pay

## 2023-09-12 ENCOUNTER — Other Ambulatory Visit: Payer: Self-pay | Admitting: Physician Assistant

## 2023-09-12 MED ORDER — TRAMADOL HCL 50 MG PO TABS
50.0000 mg | ORAL_TABLET | Freq: Two times a day (BID) | ORAL | 0 refills | Status: AC
  Filled 2023-09-12: qty 14, 7d supply, fill #0

## 2023-09-12 NOTE — Telephone Encounter (Signed)
 Will send in rx for tramadol  to take twice daily and also use tylenol  as directed

## 2023-09-12 NOTE — Telephone Encounter (Signed)
 Patient stated that she is having pain 7/10 both side and hips. Stated she don't know if maybe coming back to work early is too much but she has been standing all morning and sitting some and pain is not getting better. She has taking 2 tylenol  at breakfast and lunch but no helping, wanted to know if something can be sent in for pain.

## 2023-09-13 ENCOUNTER — Ambulatory Visit: Admitting: Surgical

## 2023-09-13 ENCOUNTER — Encounter: Payer: Self-pay | Admitting: Surgical

## 2023-09-13 VITALS — BP 115/67 | HR 95 | Ht 63.0 in | Wt 194.8 lb

## 2023-09-13 DIAGNOSIS — Z9889 Other specified postprocedural states: Secondary | ICD-10-CM

## 2023-09-13 LAB — URINE CULTURE

## 2023-09-13 NOTE — Progress Notes (Signed)
 Patient is a 33 year old female here for follow-up after panniculectomy Dr. Waddell on 08/25/2023.  She is just shy of 3 weeks postop.  She is here today for evaluation.  She was last seen 2 days ago, the JP drain on the left side was having some issues, the bulb would not stay compressed.  Today she reports that she is overall doing well, she is continue to wear compression.  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  She reports that the drainage on the left side continues to be dark.  She reports drainage over the last 24 hours or so has been about 25 cc, she does not recall how much it was prior to that, but was approximately around 30 to 35 cc.  Chaperone present on exam On exam of abdomen, incision is intact and healing very nicely.  There is no subcutaneous fluid collection with palpation.  There is minimal discomfort with palpation.  She does not have any excessive swelling.  There is no erythema or cellulitic changes noted. JP drain is in place, black dot is about 2 cm outside of the JP drain insertion opening.  A/P:  Patient is overall doing well, left JP drain was removed, she tolerated this well.  Discussed the importance of compressive garments, avoid extraneous activities or heavy lifting.  We discussed not increasing activity too rapidly to avoid recurrent fluid collection.  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  No signs infection or concern on exam.  Recommend following up in 2 weeks for reevaluation.

## 2023-09-18 ENCOUNTER — Encounter: Payer: Self-pay | Admitting: Surgical

## 2023-09-18 ENCOUNTER — Ambulatory Visit (INDEPENDENT_AMBULATORY_CARE_PROVIDER_SITE_OTHER): Admitting: Surgical

## 2023-09-18 VITALS — BP 138/76 | HR 100 | Ht 63.0 in | Wt 189.6 lb

## 2023-09-18 DIAGNOSIS — Z9889 Other specified postprocedural states: Secondary | ICD-10-CM

## 2023-09-18 NOTE — Progress Notes (Signed)
 33 year old female here for follow-up after panniculectomy on 08/25/2023.  We removed her left JP drain last week.  She reports she has been feeling well since then, does report feeling some mild bloating.  Otherwise not having any issues.  She is continuing with compression.  On exam abdominal incision is intact, healing well.  No obvious fluid collections or increased tenderness noted.  No erythema or cellulitic changes noted.  A/P:  She is doing really well, no signs infection or concern.  She has a follow-up in 11 days.  We will plan to see her then.  Continue with compression.  Call with questions or concerns.

## 2023-09-19 ENCOUNTER — Other Ambulatory Visit: Payer: Self-pay | Admitting: Physician Assistant

## 2023-09-19 ENCOUNTER — Other Ambulatory Visit (HOSPITAL_BASED_OUTPATIENT_CLINIC_OR_DEPARTMENT_OTHER): Payer: Self-pay

## 2023-09-19 DIAGNOSIS — F418 Other specified anxiety disorders: Secondary | ICD-10-CM

## 2023-09-19 MED ORDER — LORAZEPAM 1 MG PO TABS
1.0000 mg | ORAL_TABLET | Freq: Every day | ORAL | 1 refills | Status: DC | PRN
  Filled 2023-09-19: qty 30, 30d supply, fill #0
  Filled 2023-11-10: qty 30, 30d supply, fill #1

## 2023-09-22 ENCOUNTER — Ambulatory Visit

## 2023-09-22 ENCOUNTER — Other Ambulatory Visit (HOSPITAL_BASED_OUTPATIENT_CLINIC_OR_DEPARTMENT_OTHER): Payer: Self-pay

## 2023-09-22 ENCOUNTER — Other Ambulatory Visit: Payer: Self-pay | Admitting: Physician Assistant

## 2023-09-22 DIAGNOSIS — N3000 Acute cystitis without hematuria: Secondary | ICD-10-CM

## 2023-09-22 LAB — POCT URINALYSIS DIP (CLINITEK)
Blood, UA: NEGATIVE
Glucose, UA: NEGATIVE mg/dL
Ketones, POC UA: NEGATIVE mg/dL
Nitrite, UA: NEGATIVE
Spec Grav, UA: 1.02 (ref 1.010–1.025)
Urobilinogen, UA: 1 U/dL
pH, UA: 6 (ref 5.0–8.0)

## 2023-09-22 MED ORDER — CIPROFLOXACIN HCL 500 MG PO TABS
500.0000 mg | ORAL_TABLET | Freq: Two times a day (BID) | ORAL | 0 refills | Status: DC
  Filled 2023-09-22: qty 6, 3d supply, fill #0

## 2023-09-22 NOTE — Progress Notes (Signed)
 Patient is here to recheck UA. She states to feel some pressure on suprapubic area. She denies fever, chills, back pain, dysuria, nauseas.

## 2023-09-25 NOTE — Progress Notes (Deleted)
 Patient is a 33 year old female s/p panniculectomy performed 08/25/2023 Dr. Waddell who presents to clinic for postoperative follow-up.  She was last seen here in clinic on 09/18/2023.  At that time, exam was entirely benign.  Recommend that she follow-up as scheduled.  Today,

## 2023-09-26 ENCOUNTER — Ambulatory Visit (INDEPENDENT_AMBULATORY_CARE_PROVIDER_SITE_OTHER): Admit: 2023-09-26 | Discharge: 2023-09-26 | Disposition: A | Discharge status: Home / Self Care | Admitting: Radiology

## 2023-09-26 ENCOUNTER — Ambulatory Visit (INDEPENDENT_AMBULATORY_CARE_PROVIDER_SITE_OTHER): Admitting: Surgical

## 2023-09-26 ENCOUNTER — Telehealth: Payer: Self-pay | Admitting: Surgical

## 2023-09-26 ENCOUNTER — Encounter: Admitting: Physician Assistant

## 2023-09-26 ENCOUNTER — Encounter: Payer: Self-pay | Admitting: Physician Assistant

## 2023-09-26 ENCOUNTER — Telehealth: Payer: Self-pay | Admitting: Physician Assistant

## 2023-09-26 ENCOUNTER — Ambulatory Visit (HOSPITAL_BASED_OUTPATIENT_CLINIC_OR_DEPARTMENT_OTHER)
Admission: EM | Admit: 2023-09-26 | Discharge: 2023-09-26 | Disposition: A | Discharge status: Home / Self Care | Attending: Family Medicine | Admitting: Family Medicine

## 2023-09-26 ENCOUNTER — Encounter (HOSPITAL_BASED_OUTPATIENT_CLINIC_OR_DEPARTMENT_OTHER): Payer: Self-pay

## 2023-09-26 VITALS — BP 125/85 | HR 93 | Ht 63.0 in | Wt 190.2 lb

## 2023-09-26 DIAGNOSIS — Z9889 Other specified postprocedural states: Secondary | ICD-10-CM

## 2023-09-26 DIAGNOSIS — R1030 Lower abdominal pain, unspecified: Secondary | ICD-10-CM

## 2023-09-26 DIAGNOSIS — M793 Panniculitis, unspecified: Secondary | ICD-10-CM

## 2023-09-26 DIAGNOSIS — R109 Unspecified abdominal pain: Secondary | ICD-10-CM | POA: Diagnosis not present

## 2023-09-26 LAB — POCT URINE DIPSTICK
Bilirubin, UA: NEGATIVE
Blood, UA: NEGATIVE
Glucose, UA: NEGATIVE mg/dL
Ketones, POC UA: NEGATIVE mg/dL
Leukocytes, UA: NEGATIVE
Nitrite, UA: NEGATIVE
POC PROTEIN,UA: 100 — AB
Spec Grav, UA: >=1.03 — AB (ref 1.010–1.025)
Urobilinogen, UA: 4 U/dL — AB
pH, UA: 7 (ref 5.0–8.0)

## 2023-09-26 NOTE — Progress Notes (Signed)
 Patient is a 33 year old female s/p panniculectomy performed 08/25/2023 Dr. Waddell who presents to clinic for postoperative follow-up.  She was last seen here in clinic on 09/18/2023.  At that time, exam was entirely benign.   Today she presents due to tenderness of her right abdomen.  She reports she noticed some tenderness/pain beginning in the right abdomen last night.   She reports she is currently being treated for UTI with ciprofloxacin , recent positive urine culture for E. coli.  She also reports she has a history of right kidney surgery, had a partial nephrectomy in 2023.  She reports she has seen her PCP in regards to the right abdomen pain and was recommended to come to our office for further evaluation due to recent panniculectomy surgery.  She does not report any fevers, chills, shortness of breath or chest pain.   Chaperone present on exam BP 125/85 (BP Location: Left Arm, Patient Position: Sitting, Cuff Size: Large)   Pulse 93   Ht 5' 3 (1.6 m)   Wt 190 lb 3.2 oz (86.3 kg)   LMP 12/04/2022 (Exact Date)   SpO2 98%   BMI 33.69 kg/m  On exam abdominal incision is intact, well-healed.  Tenderness noted to right lateral ribs and right abdomen with palpation.  No obvious subcutaneous fluid collection noted of right abdomen or left abdomen.  Incision without any surrounding erythema.  She does have some hyperpigmentation noted of bilateral abdominal incisions extending superiorly.  No fluctuance or crepitus is noted with palpation of either side of abdomen.  No drainage noted from abdominal incisions.  A/P:  Discussed with patient unknown etiology for her discomfort in the right abdomen and right chest wall.  I discussed with patient that I do not see any obvious signs of infection or fluid collection on exam that would explain her discomfort, especially with the discomfort extending to palpation of her lateral inferior ribs.  She does have a history of gastric bypass so cannot  tolerate NSAIDs.  She has been using Tylenol  without much improvement.  Vital signs are stable on exam today, discussed with patient to continue to closely monitor bilateral lower abdominal incisions and discussed follow-up in 1 to 2 weeks for reevaluation or scheduling appointment sooner if she notices any concerning changes.  She is encouraged to follow-up with PCP if she continues to notice ongoing pain of her rib cage, could be musculoskeletal in nature.   Pictures were obtained of the patient and placed in the chart with the patient's or guardian's permission.

## 2023-09-26 NOTE — Telephone Encounter (Signed)
 Pt sees GG on thurs but has some swelling and is in pain and wants to be seen today, she was with GG this morning but was moved to thurs due to GG out sick, pt wants to know if she can come in or if someone can call her or does she need to go to urgent care? Please advise

## 2023-09-26 NOTE — Telephone Encounter (Signed)
 Patient was not feeling well and will call to schedule a Post Op appt w/ Matt S. In 1-2 weeks from today.

## 2023-09-26 NOTE — ED Provider Notes (Signed)
 [facility]    CSN: [id number] Arrival date & time: 09/26/23  1845      History   Chief Complaint Chief Complaint  Patient presents with   Abdominal Pain    HPI Michelle Lamb is a 33 y.o. female.   33 year old female that presents for eval of right sided abd pain s/p panniculectomy on 6/27. Patient was seen by surgeon today. Patient states her surgeon does not believe symptoms associated with her surgery. 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 Post cholecystectomy. Feels as if abd swollen and reports tender to touch. Pain new as of last night. No constipation. States being treated for UTI currently. Incision to abd healing without drainage. Patient wearing abd binder on arrival. Ibuprofen taken 1500. Has been using Tylenol  regularly.   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   Blood transfusion without reported diagnosis 2018   postpartum   Chest pain of uncertain etiology 06/08/2022   06/15/22 Myocardial perfusion - normal, 09/15/22 long term heart monitor in Epic   Chronic kidney disease    right kidney at 35%   Complication of anesthesia    Slow to wake up   Constipation 06/28/2022   Diabetes mellitus without complication (HCC)    NOT AN ISSUE SINCE GASTRIC BYPASS IN 2022. 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  Report date is 02/23/2022.       The CancerNext-Expanded gene panel offered by [facility] and includes sequencing, rearrangement, and RNA analysis for the following 77 genes: AIP, ALK, APC, ATM, AXIN2, BAP1, BARD1, BLM, BMPR1A, BRCA1, BRCA2, BRIP1, CDC73, CDH1, CDK4, CDKN1B, CDKN2A, CHEK2, CTNNA1, DICER1, FANCC, FH, FLCN, GALNT12, KIF1B, LZTR1,   Hepatic steatosis 09/08/2021   History of herpes simplex infection    History of partial nephrectomy 03/21/2022   Formatting of this note might be different from the original. 03/15/2021: Underwent right partial nephrectomy by Dr. Renda for right renal neoplasm. Pathology: Angiomyolipoma. Formatting of this note might be different from the original. 03/15/2021: Underwent right partial nephrectomy by Dr. Renda for right renal neoplasm. Pathology: Angiomyolipoma.   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   Morbid obesity (HCC) 06/02/2020   Neoplasm of right kidney    benign tumor of kidney   Obesity (BMI 30.0-34.9) 06/08/2022   Obstructive sleep apnea 10/29/2019   hx of before gastric bypass, no longer uses CPAP   Racing heart beat 08/22/2022   see 09/15/22 Long term heart monitor in Epic   Seasonal allergies  Sleep apnea    Ureteral obstruction, right 07/15/2021   Vaginal delivery 2009, 2010, 2015   Vitamin D  insufficiency 09/30/2020   Weight loss 09/30/2020    Patient Active Problem List   Diagnosis Date Noted   Post op infection 09/29/2023   Acute pain of left shoulder 07/13/2023   Chronic bilateral low back pain without sciatica 05/29/2023   Attention or concentration deficit 05/29/2023   Hypoglycemia 05/29/2023    Decreased lung sounds 04/30/2023   Acute nonintractable headache 04/21/2023   COVID-19 04/21/2023   Low back pain 04/04/2023   Thoracic back pain 04/04/2023   Other fatigue 03/28/2023   Influenza B 03/22/2023   Acute bilateral low back pain without sciatica 02/18/2023   Depression with anxiety 10/19/2022   Menorrhagia with irregular cycle 10/19/2022   Dizziness 08/22/2022   Racing heart beat 08/22/2022   Abnormal menses 08/22/2022   Constipation 06/28/2022   Chest pain of uncertain etiology 06/08/2022   Obesity (BMI 30.0-34.9) 06/08/2022   Anxiety 06/06/2022   Chronic kidney disease 06/06/2022   Complication of anesthesia 06/06/2022   Diabetes mellitus without complication (HCC) 06/06/2022   Fatty liver 06/06/2022   Hyperlipidemia 06/06/2022   Hypertension 06/06/2022   Seasonal allergies 06/06/2022   Vaginal delivery 06/06/2022   Vitamin D  deficiency 06/06/2022   History of partial nephrectomy 03/21/2022   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   Genetic testing 03/03/2022   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   Viral disease 03/02/2022   Annual physical exam 11/30/2021   Absolute anemia 10/12/2021   Iron  deficiency anemia due to chronic blood loss 09/14/2021   Hepatic steatosis 09/08/2021   Acute laryngopharyngitis 09/08/2021   Gastroesophageal reflux disease without esophagitis 09/08/2021   Attention deficit hyperactivity disorder (ADHD), 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   Ureteral obstruction, right 07/15/2021   Neoplasm of right kidney 03/15/2021   LUQ abdominal pain 12/24/2020   Dehydration 12/24/2020   Acute cystitis with hematuria 12/22/2020   Malaise 12/22/2020   Influenza A 12/14/2020   Vitamin D  insufficiency 09/30/2020   Weight loss 09/30/2020   Mixed dyslipidemia 09/30/2020   Mixed hyperlipidemia 09/30/2020   Anxiety with depression 09/23/2020   Morbid obesity (HCC) 06/02/2020   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    Asthma 05/22/2020   Obstructive sleep apnea 10/29/2019   Hypertension, benign essential, goal below 140/90 06/20/2019   Type 2 diabetes mellitus with obesity (HCC) 06/20/2019   Blood transfusion without reported diagnosis 2018   BMI 50.0-59.9, adult (HCC) 12/28/2015   History of herpes simplex infection (serology only) 12/28/2015    Past Surgical History:  Procedure Laterality Date   ABDOMINAL HYSTERECTOMY  2024   APPENDECTOMY     CESAREAN SECTION  09/29/2016   CHOLECYSTECTOMY     COLONOSCOPY WITH ESOPHAGOGASTRODUODENOSCOPY (EGD)  07/22/2022   CYSTOSCOPY N/A 12/27/2022   Procedure: CYSTOSCOPY;  Surgeon: Glennon Almarie POUR, MD;  Location: [facility];  Service: Gynecology;  Laterality: N/A;   CYSTOSCOPY W/ URETERAL STENT PLACEMENT Right 07/15/2021   Procedure: CYSTOSCOPY WITH RETROGRADE PYELOGRAM/URETERAL STENT PLACEMENT;  Surgeon: Renda Glance, MD;  Location: [facility];  Service: Urology;  Laterality: Right;   CYSTOSCOPY WITH RETROGRADE PYELOGRAM, URETEROSCOPY AND STENT PLACEMENT Right 05/03/2021   Procedure: CYSTOSCOPY WITH RIGHT RETROGRADE PYELOGRAM, URETEROSCOPY;  Surgeon: Renda Glance, MD;  Location: [facility];  Service: Urology;  Laterality: Right;   DILATION AND CURETTAGE OF UTERUS N/A 12/31/2015   Procedure: SUCTION DILATATION AND CURETTAGE;  Surgeon: Bebe Furry, MD;  Location: [facility];  Service: Gynecology;  Laterality: N/A;   DILATION AND EVACUATION N/A 01/01/2016   Procedure: DILATATION AND EVACUATION;  Surgeon: Norleen LULLA Server, MD;  Location: [facility];  Service: Gynecology;  Laterality: N/A;   ESOPHAGOGASTRODUODENOSCOPY     GASTRIC BYPASS  06/02/2020   IR NEPHRO TUBE REMOV/FL  07/16/2021   IR NEPHROSTOMY EXCHANGE RIGHT  05/12/2021   IR NEPHROSTOMY PLACEMENT RIGHT  05/05/2021   PANNICULECTOMY N/A 08/25/2023   Procedure: PANNICULECTOMY;  Surgeon: Waddell Leonce NOVAK, MD;  Location: [facility];  Service: Plastics;  Laterality: N/A;   ROBOT  ASSISTED PYELOPLASTY Right 07/15/2021   Procedure: XI ROBOTIC ASSISTED  LAPAROSCOPIC PYELOPLASTY;  Surgeon: Renda Glance, MD;  Location: [facility];  Service: Urology;  Laterality: Right;   ROBOTIC ASSISTED LAPAROSCOPIC HYSTERECTOMY AND SALPINGECTOMY Bilateral 12/27/2022   Procedure: XI ROBOTIC ASSISTED LAPAROSCOPIC HYSTERECTOMY AND SALPINGECTOMY;  Surgeon: Glennon Almarie POUR, MD;  Location: [facility];  Service: Gynecology;  Laterality: Bilateral;   ROBOTIC ASSITED PARTIAL NEPHRECTOMY Right 03/15/2021   Procedure: XI ROBOTIC ASSITED PARTIAL NEPHRECTOMY;  Surgeon: Renda Glance, MD;  Location: [facility];  Service: Urology;  Laterality: Right;   TONSILLECTOMY     TUBAL LIGATION     UPPER GASTROINTESTINAL ENDOSCOPY     w/dilation    OB History     Gravida  6   Para  4   Term  3   Preterm  1   AB  2   Living  4      SAB  2   IAB      Ectopic      Multiple      Live Births  4        Obstetric Comments  H/o GDM and HTN in her last pregnancy          Home Medications    Prior to Admission medications   Medication Sig Start Date End Date Taking? Authorizing Provider  albuterol  (VENTOLIN  HFA) 108 (90 Base) MCG/ACT inhaler Inhale 2 puffs into the lungs every 6 (six) hours as needed for wheezing or shortness of breath. 09/08/21   Nicholaus Credit, PA-C  ciprofloxacin  (CIPRO ) 500 MG tablet Take 1 tablet (500 mg total) by mouth 2 (two) times daily for 10 days. 09/28/23 10/08/23  Nicholaus Credit, PA-C  Continuous Glucose Sensor (FREESTYLE LIBRE 3 SENSOR) MISC Use every 14 days - apply as directed 05/10/23   Nicholaus Credit, PA-C  cyanocobalamin  (VITAMIN B12) 1000 MCG/ML injection Inject 1 ML as directed once a month 07/02/23   Nicholaus Credit, PA-C  fluticasone  (FLONASE ) 50 MCG/ACT nasal spray Place 2 sprays into both nostrils daily. Patient taking differently: Place 2 sprays into both nostrils daily as needed for allergies or rhinitis. 03/21/22   Charlene Clotilda PARAS, NP   HYDROcodone -acetaminophen  (NORCO/VICODIN) 5-325 MG tablet Take 1 - 2 tablets by mouth every 6 hours as needed. 09/27/23   Nicholaus Credit, PA-C  linaclotide  (LINZESS ) 290 MCG CAPS capsule Take one capsule on an empty stomach 30 minutes prior to first meal of the day. Keep medicine in its original container. 07/21/23     lisdexamfetamine (VYVANSE ) 70 MG capsule Take 1 capsule (70 mg total) by mouth daily. 09/11/23   Nicholaus Credit, PA-C  LORazepam  (ATIVAN ) 1 MG tablet Take 1 tablet (1 mg total) by mouth daily as needed. 09/19/23   Nicholaus Credit, PA-C  meclizine  (ANTIVERT ) 25 MG tablet Take 1 tablet (25 mg total) by mouth 3 (three) times daily as needed for  dizziness. 12/07/22   Nicholaus Credit, PA-C  Multiple Vitamin (MULTIVITAMIN WITH MINERALS) TABS tablet Take 1 tablet by mouth daily.    [provider]  ondansetron  (ZOFRAN -ODT) 4 MG disintegrating tablet Take 1 tablet (4 mg total) by mouth every 8 (eight) hours as needed for nausea or vomiting. 08/09/23   Landy Honora CROME, PA-C  pantoprazole  (PROTONIX ) 40 MG tablet Take 1 tablet (40 mg total) by mouth daily. 03/30/23   Nicholaus Credit, PA-C  Sodium Fluoride  (SODIUM FLUORIDE  5000 PPM) 1.1 % PSTE Apply 1 application to the teeth twice a day; . Floss first, then brush 2 times daily with PreviDent toothpaste. Expectorate. Do not rinse, use mouth rinse, eat, or drink for 30 min after use. 09/07/23     venlafaxine  XR (EFFEXOR  XR) 150 MG 24 hr capsule Take 1 capsule (150 mg total) by mouth daily with breakfast. 07/27/23   [facility], Kirsten, MD  Vitamin D , Ergocalciferol , (DRISDOL ) 1.25 MG (50000 UNIT) CAPS capsule Twice weekly Patient taking differently: Take 50,000 Units by mouth 2 (two) times a week. Twice weekly 05/01/23   Nicholaus Credit, PA-C    Family History Family History  Problem Relation Age of Onset   Hypertension Mother    Thyroid  disease Mother    Cervical cancer Mother        dx 64s   Depression Mother    Diabetes Mother    Hyperlipidemia Mother    Anxiety  disorder Mother    Asthma Father    Stomach cancer Maternal Grandmother        dx 66s   Leukemia Maternal Grandfather        d. 11s   Depression Paternal Grandmother    Diabetes Paternal Grandmother    Cancer Paternal Grandfather        dx after 78; mets   Breast cancer Maternal Great-grandmother        dx <50; mets; MGF's mother   Stomach cancer Other        MGM's brother; dx after 51   Cancer Other        MGF's sisters x3; unknown primary   ADD / ADHD Daughter    Colon cancer Neg Hx    Colon polyps Neg Hx    Esophageal cancer Neg Hx    Rectal cancer Neg Hx     Social History Social History   Tobacco Use   Smoking status: Never   Smokeless tobacco: Never  Vaping Use   Vaping status: Never Used  Substance Use Topics   Alcohol use: Yes    Comment: occasionally   Drug use: Never     Allergies   Bactrim  [sulfamethoxazole -trimethoprim ], Dilaudid  [hydromorphone ], and Sulfa  antibiotics   Review of Systems Review of Systems  Gastrointestinal:  Positive for abdominal pain.   See HPI  Physical Exam Triage Vital Signs ED Triage Vitals  Encounter Vitals Group     BP 09/26/23 1908 116/79     Girls Systolic BP Percentile --      Girls Diastolic BP Percentile --      Boys Systolic BP Percentile --      Boys Diastolic BP Percentile --      Pulse Rate 09/26/23 1908 87     Resp 09/26/23 1908 20     Temp 09/26/23 1908 98.6 F (37 C)     Temp Source 09/26/23 1908 Oral     SpO2 09/26/23 1908 98 %     Weight --      Height --  Head Circumference --      Peak Flow --      Pain Score 09/26/23 1909 7     Pain Loc --      Pain Education --      Exclude from Growth Chart --    No data found.  Updated Vital Signs BP 116/79 (BP Location: Right Arm)   Pulse 87   Temp 98.6 F (37 C) (Oral)   Resp 20   LMP 12/04/2022 (Exact Date)   SpO2 98%   Visual Acuity Right Eye Distance:   Left Eye Distance:   Bilateral Distance:    Right Eye Near:   Left Eye Near:     Bilateral Near:     Physical Exam Vitals and nursing note reviewed.  Constitutional:      General: She is not in acute distress.    Appearance: Normal appearance. She is not ill-appearing, toxic-appearing or diaphoretic.  Pulmonary:     Effort: Pulmonary effort is normal.  Abdominal:     Palpations: Abdomen is soft.     Comments: Very tender near incision area right abdomen. Not swollen or erythematous.  Slightly guarded.   Neurological:     Mental Status: She is alert.  Psychiatric:        Mood and Affect: Mood normal.      UC Treatments / Results  Labs (all labs ordered are listed, but only abnormal results are displayed) Labs Reviewed  POCT URINE DIPSTICK - Abnormal; Notable for the following components:      Result Value   Spec Grav, UA >=1.030 (*)    POC PROTEIN,UA =100 (*)    Urobilinogen, UA 4.0 (*)    All other components within normal limits    EKG   Radiology No results found.  Procedures Procedures (including critical care time)  Medications Ordered in UC Medications - No data to display  Initial Impression / Assessment and Plan / UC Course  I have reviewed the triage vital signs and the nursing notes.  Pertinent labs & imaging results that were available during my care of the patient were reviewed by me and considered in my medical decision making (see chart for details).     Abdominal pain- Pt does appear to be uncomfortable. Limited on imaging here this late. Was evaluated by surgeon earlier today without concerns. Urine doesn't appear to be infected although she does have protein, and urobilinogen. Could be relate to some sort of infection or dehydration. Vitals stable. Xray with moderate stool. Recommended Miralax. If her pain persists she may need to go to the ER for a CT scan. Pt agree. ER if worse.   Final Clinical Impressions(s) / UC Diagnoses   Final diagnoses:  Lower abdominal pain     Discharge Instructions      There is no  specific concerns on your x-ray.  Your urine did not show any infection. You did have some protien on your urine that may be from dehydration.  You did have a moderate amount of stool on the x-ray.  You could try doing some Dulcolax or MiraLAX over the next day or 2 and see if having a bit of a good bowel movement will help. If the pain worsens I would recommend going to the ER for more imaging Make sure you are drinking plenty of water .     ED Prescriptions   None    PDMP not reviewed this encounter.   Adah Wilbert LABOR, FNP 09/29/23 [phone]

## 2023-09-26 NOTE — Discharge Instructions (Signed)
 There is no specific concerns on your x-ray.  Your urine did not show any infection. You did have some protien on your urine that may be from dehydration.  You did have a moderate amount of stool on the x-ray.  You could try doing some Dulcolax or MiraLAX over the next day or 2 and see if having a bit of a good bowel movement will help. If the pain worsens I would recommend going to the ER for more imaging Make sure you are drinking plenty of water .

## 2023-09-26 NOTE — Telephone Encounter (Signed)
 Pt coming today for evaluation at 3pm

## 2023-09-26 NOTE — ED Triage Notes (Addendum)
 Presents for eval of right side upper abd pain s/p panniculectomy on 6/27. Patient was seen by surgeon today. Patient states her surgeon does not believe symptoms associated with her surgery. 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 States gallbladder is gone. Feels as if abd swollen and reports tender to touch. Pain new as of last night. No constipation. States being treated for UTI currently. Incision to abd healing without drainage. Patient wearing abd binder on arrival. Ibuprofen taken 1500. Has been using Tylenol  regularly.

## 2023-09-27 ENCOUNTER — Other Ambulatory Visit: Payer: Self-pay | Admitting: Surgical

## 2023-09-27 ENCOUNTER — Telehealth: Payer: Self-pay | Admitting: Plastic Surgery

## 2023-09-27 ENCOUNTER — Encounter: Payer: Self-pay | Admitting: Physician Assistant

## 2023-09-27 ENCOUNTER — Other Ambulatory Visit: Payer: Self-pay | Admitting: Physician Assistant

## 2023-09-27 ENCOUNTER — Encounter: Admitting: Family Medicine

## 2023-09-27 ENCOUNTER — Ambulatory Visit (INDEPENDENT_AMBULATORY_CARE_PROVIDER_SITE_OTHER): Admitting: Physician Assistant

## 2023-09-27 ENCOUNTER — Other Ambulatory Visit (HOSPITAL_BASED_OUTPATIENT_CLINIC_OR_DEPARTMENT_OTHER): Payer: Self-pay

## 2023-09-27 ENCOUNTER — Other Ambulatory Visit: Payer: Self-pay

## 2023-09-27 ENCOUNTER — Ambulatory Visit (HOSPITAL_BASED_OUTPATIENT_CLINIC_OR_DEPARTMENT_OTHER)

## 2023-09-27 VITALS — BP 122/80 | HR 92 | Temp 99.5°F | Resp 16 | Ht 63.0 in | Wt 187.0 lb

## 2023-09-27 DIAGNOSIS — L02211 Cutaneous abscess of abdominal wall: Secondary | ICD-10-CM

## 2023-09-27 DIAGNOSIS — R1031 Right lower quadrant pain: Secondary | ICD-10-CM

## 2023-09-27 DIAGNOSIS — Z9889 Other specified postprocedural states: Secondary | ICD-10-CM

## 2023-09-27 DIAGNOSIS — L03311 Cellulitis of abdominal wall: Secondary | ICD-10-CM | POA: Diagnosis not present

## 2023-09-27 DIAGNOSIS — T888XXA Other specified complications of surgical and medical care, not elsewhere classified, initial encounter: Secondary | ICD-10-CM

## 2023-09-27 LAB — POCT URINALYSIS DIP (CLINITEK)
Blood, UA: NEGATIVE
Glucose, UA: NEGATIVE mg/dL
Ketones, POC UA: NEGATIVE mg/dL
Leukocytes, UA: NEGATIVE
Nitrite, UA: NEGATIVE
POC PROTEIN,UA: 30 — AB
Spec Grav, UA: >=1.03 — AB (ref 1.010–1.025)
Urobilinogen, UA: 2 U/dL — AB
pH, UA: 6 (ref 5.0–8.0)

## 2023-09-27 LAB — CBC AND DIFFERENTIAL
HCT: 38 (ref 36–46)
Hemoglobin: 12.7 (ref 12.0–16.0)
Neutrophils Absolute: 76.5
Platelets: 250 K/uL (ref 150–400)
WBC: 13.7

## 2023-09-27 LAB — HEPATIC FUNCTION PANEL
ALT: 23 U/L (ref 7–35)
AST: 23 (ref 13–35)
Alkaline Phosphatase: 89 (ref 25–125)

## 2023-09-27 LAB — CBC: RBC: 4.26 (ref 3.87–5.11)

## 2023-09-27 LAB — COMPREHENSIVE METABOLIC PANEL WITH GFR
Albumin: 4.1 (ref 3.5–5.0)
Calcium: 9 (ref 8.7–10.7)
EGFR: 126

## 2023-09-27 LAB — BASIC METABOLIC PANEL WITH GFR
BUN: 12 (ref 4–21)
CO2: 26 — AB (ref 13–22)
Chloride: 101 (ref 99–108)
Creatinine: 0.5 (ref 0.5–1.1)
Glucose: 121
Potassium: 3.8 meq/L (ref 3.5–5.1)
Sodium: 135 — AB (ref 137–147)

## 2023-09-27 MED ORDER — CEFTRIAXONE SODIUM 1 G IJ SOLR
1.0000 g | Freq: Once | INTRAMUSCULAR | Status: AC
  Administered 2023-09-27: 1 g via INTRAMUSCULAR

## 2023-09-27 MED ORDER — HYDROCODONE-ACETAMINOPHEN 5-325 MG PO TABS
1.0000 | ORAL_TABLET | Freq: Four times a day (QID) | ORAL | 0 refills | Status: DC | PRN
  Filled 2023-09-27: qty 20, 3d supply, fill #0

## 2023-09-27 MED ORDER — SULFAMETHOXAZOLE-TRIMETHOPRIM 800-160 MG PO TABS
1.0000 | ORAL_TABLET | Freq: Two times a day (BID) | ORAL | 0 refills | Status: DC
  Filled 2023-09-27: qty 20, 10d supply, fill #0

## 2023-09-27 NOTE — Progress Notes (Signed)
 Patient is a 33 year old female status post panniculectomy on 08/25/2023.   She was seen in office yesterday, had tenderness to right abdomen and right ribs but no obvious signs of infection on exam and no infectious symptoms.  Patient reports that she started to feel worse yesterday evening and this morning and was sent for CT scan by PCP, CT scan showed oblong shaped abscess in the right flank subcutaneous soft tissues overlying the right iliac crest measuring 9 x 4 x 5 cm.  I personally spoke with patient's PCP Lauraine Moats, PA-C.  Patient received Rocephin  injection today.  We discussed antibiotic prescription for Bactrim . Discussed that we would set her up for ultrasound with needle guided aspiration of fluid collection and possible drain placement.  I subsequently called patient to check in on her, patient noted that she started feeling worse yesterday evening and into the morning.  Had CT scan at [facility].  She reports that she feels fatigued.  We discussed return precautions to emergency room, discussed with patient that we are on call 24/7 if she has any symptomatic changes or concerns.  Discussed with patient that I would notify Dr. Waddell as well of her recent CT scan and we will set her up for ultrasound and needle guided aspiration and possible drain placement by interventional radiology.  Patient was agreeable with the plan

## 2023-09-27 NOTE — Telephone Encounter (Signed)
 Michelle Lamb [phone] from [facility] calling needs to speak with a provider urgently  Pt has abnormal labs elevated white blood count, abnormal CT of abdomen  Pt is at [facility] waiting for what to do for treatment.   Michelle wants to stay on hold and not get a call back.

## 2023-09-27 NOTE — Progress Notes (Signed)
 Acute Office Visit  Subjective:    Patient ID: Michelle Lamb, female    DOB: June 11, 1990, 33 y.o.   MRN: [id number]  Chief Complaint  Patient presents with   Right lower flank tender and swelling    HPI: Patient is in today for complaints of right lower abdomen and flank pain  Pt had panniculectomy surgery a few weeks ago and had been healing well with only generalized tenderness and soreness - she states that on Monday her pain became worse on her right lower abdomen around incision site feeling a sharp pain. Yesterday the area of tenderness was hard to touch and slightly warm to touch and she did go for appt to see surgeon - the provider at that office stated they did not appreciate any abnormality of the surgical area or her abdomen and advised she follow up with PCP Pt's pain worsened yesterday and she went to Urgent Care - ua was done which showed moderate urobilinogen but otherwise normal - abdominal xray done was normal -  Pt completed cipro  today for recent UTI (culture positive for Ecoli) Pt in today because she is having more tenderness on her lower abdomen and feels harder/warmer than yesterday She denies fever, no urine symptoms and no problems having bowel movement   Current Outpatient Medications:    albuterol  (VENTOLIN  HFA) 108 (90 Base) MCG/ACT inhaler, Inhale 2 puffs into the lungs every 6 (six) hours as needed for wheezing or shortness of breath., Disp: 1 each, Rfl: 5   Continuous Glucose Sensor (FREESTYLE LIBRE 3 SENSOR) MISC, Use every 14 days - apply as directed, Disp: 2 each, Rfl: 5   cyanocobalamin  (VITAMIN B12) 1000 MCG/ML injection, Inject 1 ML as directed once a month, Disp: 1 mL, Rfl: 2   fluticasone  (FLONASE ) 50 MCG/ACT nasal spray, Place 2 sprays into both nostrils daily. (Patient taking differently: Place 2 sprays into both nostrils daily as needed for allergies or rhinitis.), Disp: 16 g, Rfl: 6   linaclotide  (LINZESS ) 290 MCG CAPS capsule, Take one  capsule on an empty stomach 30 minutes prior to first meal of the day. Keep medicine in its original container., Disp: 90 capsule, Rfl: 4   lisdexamfetamine (VYVANSE ) 70 MG capsule, Take 1 capsule (70 mg total) by mouth daily., Disp: 30 capsule, Rfl: 0   LORazepam  (ATIVAN ) 1 MG tablet, Take 1 tablet (1 mg total) by mouth daily as needed., Disp: 30 tablet, Rfl: 1   meclizine  (ANTIVERT ) 25 MG tablet, Take 1 tablet (25 mg total) by mouth 3 (three) times daily as needed for dizziness., Disp: 30 tablet, Rfl: 0   Multiple Vitamin (MULTIVITAMIN WITH MINERALS) TABS tablet, Take 1 tablet by mouth daily., Disp: , Rfl:    ondansetron  (ZOFRAN -ODT) 4 MG disintegrating tablet, Take 1 tablet (4 mg total) by mouth every 8 (eight) hours as needed for nausea or vomiting., Disp: 20 tablet, Rfl: 0   pantoprazole  (PROTONIX ) 40 MG tablet, Take 1 tablet (40 mg total) by mouth daily., Disp: 30 tablet, Rfl: 11   Sodium Fluoride  (SODIUM FLUORIDE  5000 PPM) 1.1 % PSTE, Apply 1 application to the teeth twice a day; . Floss first, then brush 2 times daily with PreviDent toothpaste. Expectorate. Do not rinse, use mouth rinse, eat, or drink for 30 min after use., Disp: 100 mL, Rfl: 5   venlafaxine  XR (EFFEXOR  XR) 150 MG 24 hr capsule, Take 1 capsule (150 mg total) by mouth daily with breakfast., Disp: 90 capsule, Rfl: 0   Vitamin D ,  Ergocalciferol , (DRISDOL ) 1.25 MG (50000 UNIT) CAPS capsule, Twice weekly, Disp: , Rfl:    HYDROcodone -acetaminophen  (NORCO/VICODIN) 5-325 MG tablet, Take 1 - 2 tablets by mouth every 6 hours as needed., Disp: 20 tablet, Rfl: 0  Allergies  Allergen Reactions   Dilaudid  [Hydromorphone ] Rash    ROS CONSTITUTIONAL: Negative for chills, fatigue, fever,   E/N/T: Negative for ear pain, nasal congestion and sore throat.  CARDIOVASCULAR: Negative for chest pain,  RESPIRATORY: Negative for recent cough and dyspnea.  GASTROINTESTINAL: see HPI  INTEGUMENTARY:see HPI      Objective:    PHYSICAL EXAM:    BP 122/80   Pulse 92   Temp 99.5 F (37.5 C)   Resp 16   Ht 5' 3 (1.6 m)   Wt 187 lb (84.8 kg)   LMP 12/04/2022 (Exact Date)   SpO2 99%   BMI 33.13 kg/m    GEN: Well nourished, well developed, in no acute distress   Cardiac: RRR; no murmurs, Respiratory:  normal respiratory rate and pattern with no distress - normal breath sounds with no rales, rhonchi, wheezes or rubs GI: normal bowel sounds, incision site appears well healed however the right lower abdomen has tautness and fullness right at incision with tenderness to palpation - skin slightly discolored and warm  Office Visit on 09/27/2023  Component Date Value Ref Range Status   Color, UA 09/27/2023 straw (A)  yellow Final   Clarity, UA 09/27/2023 clear  clear Final   Glucose, UA 09/27/2023 negative  negative mg/dL Final   Bilirubin, UA 92/69/7974 small (A)  negative Final   Ketones, POC UA 09/27/2023 negative  negative mg/dL Final   Spec Grav, UA 92/69/7974 >=1.030 (A)  1.010 - 1.025 Final   Blood, UA 09/27/2023 negative  negative Final   pH, UA 09/27/2023 6.0  5.0 - 8.0 Final   POC PROTEIN,UA 09/27/2023 =30 (A)  negative, trace Final   Urobilinogen, UA 09/27/2023 2.0 (A)  0.2 or 1.0 E.U./dL Final   Nitrite, UA 92/69/7974 Negative  Negative Final   Leukocytes, UA 09/27/2023 Negative  Negative Final       Assessment & Plan:    Right lower quadrant abdominal pain -     CBC with Differential/Platelet -     Comprehensive metabolic panel with GFR -     cefTRIAXone  Sodium 1 gram given IM -     CT ABDOMEN PELVIS W CONTRAST; Future -     POCT URINALYSIS DIP (CLINITEK) -     HYDROcodone -Acetaminophen ; Take 1 - 2 tablets by mouth every 6 hours as needed.  Dispense: 20 tablet; Refill: 0     Follow-up: Return in about 1 week (around 10/04/2023) for follow-up.  An After Visit Summary was printed and given to the patient.  CAMIE JONELLE NICHOLAUS DEVONNA [facility] [phone]

## 2023-09-27 NOTE — Telephone Encounter (Signed)
 Called and spoke with Lauraine Moats, PA-C in regards to patient.  Placed orders for ultrasound, documented encounter in orders note.Michelle Lamb

## 2023-09-28 ENCOUNTER — Inpatient Hospital Stay (HOSPITAL_COMMUNITY)
Admission: EM | Admit: 2023-09-28 | Discharge: 2023-10-01 | DRG: 863 | Disposition: A | Discharge status: Home / Self Care | Attending: Family Medicine | Admitting: Family Medicine

## 2023-09-28 ENCOUNTER — Encounter: Admitting: Physician Assistant

## 2023-09-28 ENCOUNTER — Other Ambulatory Visit (HOSPITAL_BASED_OUTPATIENT_CLINIC_OR_DEPARTMENT_OTHER): Payer: Self-pay

## 2023-09-28 ENCOUNTER — Ambulatory Visit (INDEPENDENT_AMBULATORY_CARE_PROVIDER_SITE_OTHER)

## 2023-09-28 ENCOUNTER — Encounter (HOSPITAL_COMMUNITY): Payer: Self-pay | Admitting: Emergency Medicine

## 2023-09-28 ENCOUNTER — Encounter (HOSPITAL_COMMUNITY): Payer: Self-pay

## 2023-09-28 ENCOUNTER — Other Ambulatory Visit: Payer: Self-pay

## 2023-09-28 ENCOUNTER — Telehealth: Payer: Self-pay | Admitting: Surgical

## 2023-09-28 ENCOUNTER — Other Ambulatory Visit: Payer: Self-pay | Admitting: Physician Assistant

## 2023-09-28 DIAGNOSIS — I129 Hypertensive chronic kidney disease with stage 1 through stage 4 chronic kidney disease, or unspecified chronic kidney disease: Secondary | ICD-10-CM | POA: Diagnosis present

## 2023-09-28 DIAGNOSIS — B9561 Methicillin susceptible Staphylococcus aureus infection as the cause of diseases classified elsewhere: Secondary | ICD-10-CM | POA: Diagnosis present

## 2023-09-28 DIAGNOSIS — T8149XA Infection following a procedure, other surgical site, initial encounter: Secondary | ICD-10-CM | POA: Diagnosis not present

## 2023-09-28 DIAGNOSIS — F39 Unspecified mood [affective] disorder: Secondary | ICD-10-CM | POA: Diagnosis present

## 2023-09-28 DIAGNOSIS — E782 Mixed hyperlipidemia: Secondary | ICD-10-CM | POA: Diagnosis present

## 2023-09-28 DIAGNOSIS — F900 Attention-deficit hyperactivity disorder, predominantly inattentive type: Secondary | ICD-10-CM | POA: Diagnosis present

## 2023-09-28 DIAGNOSIS — E66811 Obesity, class 1: Secondary | ICD-10-CM | POA: Diagnosis present

## 2023-09-28 DIAGNOSIS — Z806 Family history of leukemia: Secondary | ICD-10-CM

## 2023-09-28 DIAGNOSIS — Z9079 Acquired absence of other genital organ(s): Secondary | ICD-10-CM

## 2023-09-28 DIAGNOSIS — Z833 Family history of diabetes mellitus: Secondary | ICD-10-CM

## 2023-09-28 DIAGNOSIS — G4733 Obstructive sleep apnea (adult) (pediatric): Secondary | ICD-10-CM | POA: Diagnosis present

## 2023-09-28 DIAGNOSIS — Z90710 Acquired absence of both cervix and uterus: Secondary | ICD-10-CM

## 2023-09-28 DIAGNOSIS — Z6834 Body mass index (BMI) 34.0-34.9, adult: Secondary | ICD-10-CM

## 2023-09-28 DIAGNOSIS — Y839 Surgical procedure, unspecified as the cause of abnormal reaction of the patient, or of later complication, without mention of misadventure at the time of the procedure: Secondary | ICD-10-CM | POA: Diagnosis present

## 2023-09-28 DIAGNOSIS — L509 Urticaria, unspecified: Secondary | ICD-10-CM | POA: Diagnosis not present

## 2023-09-28 DIAGNOSIS — R5383 Other fatigue: Secondary | ICD-10-CM | POA: Diagnosis present

## 2023-09-28 DIAGNOSIS — Z882 Allergy status to sulfonamides status: Secondary | ICD-10-CM

## 2023-09-28 DIAGNOSIS — Z825 Family history of asthma and other chronic lower respiratory diseases: Secondary | ICD-10-CM

## 2023-09-28 DIAGNOSIS — N189 Chronic kidney disease, unspecified: Secondary | ICD-10-CM | POA: Diagnosis present

## 2023-09-28 DIAGNOSIS — L02211 Cutaneous abscess of abdominal wall: Secondary | ICD-10-CM | POA: Diagnosis present

## 2023-09-28 DIAGNOSIS — Z800 Family history of malignant neoplasm of digestive organs: Secondary | ICD-10-CM

## 2023-09-28 DIAGNOSIS — J45909 Unspecified asthma, uncomplicated: Secondary | ICD-10-CM | POA: Diagnosis present

## 2023-09-28 DIAGNOSIS — E1122 Type 2 diabetes mellitus with diabetic chronic kidney disease: Secondary | ICD-10-CM | POA: Diagnosis present

## 2023-09-28 DIAGNOSIS — Z803 Family history of malignant neoplasm of breast: Secondary | ICD-10-CM

## 2023-09-28 DIAGNOSIS — Z905 Acquired absence of kidney: Secondary | ICD-10-CM

## 2023-09-28 DIAGNOSIS — Z9884 Bariatric surgery status: Secondary | ICD-10-CM

## 2023-09-28 DIAGNOSIS — Z9049 Acquired absence of other specified parts of digestive tract: Secondary | ICD-10-CM

## 2023-09-28 DIAGNOSIS — T8140XA Infection following a procedure, unspecified, initial encounter: Secondary | ICD-10-CM | POA: Diagnosis present

## 2023-09-28 DIAGNOSIS — Z83438 Family history of other disorder of lipoprotein metabolism and other lipidemia: Secondary | ICD-10-CM

## 2023-09-28 DIAGNOSIS — L03311 Cellulitis of abdominal wall: Secondary | ICD-10-CM | POA: Diagnosis present

## 2023-09-28 DIAGNOSIS — Z8049 Family history of malignant neoplasm of other genital organs: Secondary | ICD-10-CM

## 2023-09-28 DIAGNOSIS — Z818 Family history of other mental and behavioral disorders: Secondary | ICD-10-CM

## 2023-09-28 DIAGNOSIS — Z8249 Family history of ischemic heart disease and other diseases of the circulatory system: Secondary | ICD-10-CM

## 2023-09-28 DIAGNOSIS — Z79899 Other long term (current) drug therapy: Secondary | ICD-10-CM

## 2023-09-28 DIAGNOSIS — Z8349 Family history of other endocrine, nutritional and metabolic diseases: Secondary | ICD-10-CM

## 2023-09-28 DIAGNOSIS — N3001 Acute cystitis with hematuria: Secondary | ICD-10-CM | POA: Diagnosis present

## 2023-09-28 DIAGNOSIS — B9620 Unspecified Escherichia coli [E. coli] as the cause of diseases classified elsewhere: Secondary | ICD-10-CM | POA: Diagnosis present

## 2023-09-28 DIAGNOSIS — Z885 Allergy status to narcotic agent status: Secondary | ICD-10-CM

## 2023-09-28 LAB — BASIC METABOLIC PANEL WITH GFR
Anion gap: 8 (ref 5–15)
BUN: 16 mg/dL (ref 6–20)
CO2: 25 mmol/L (ref 22–32)
Calcium: 9.1 mg/dL (ref 8.9–10.3)
Chloride: 102 mmol/L (ref 98–111)
Creatinine, Ser: 0.66 mg/dL (ref 0.44–1.00)
GFR, Estimated: >60 mL/min (ref >60)
Glucose, Bld: 93 mg/dL (ref 70–99)
Potassium: 3.7 mmol/L (ref 3.5–5.1)
Sodium: 135 mmol/L (ref 135–145)

## 2023-09-28 LAB — CBC WITH DIFFERENTIAL/PLATELET
Abs Immature Granulocytes: 0.04 K/uL (ref 0.00–0.07)
Basophils Absolute: 0 K/uL (ref 0.0–0.1)
Basophils Relative: 0 %
Eosinophils Absolute: 0.3 K/uL (ref 0.0–0.5)
Eosinophils Relative: 3 %
HCT: 39.3 % (ref 36.0–46.0)
Hemoglobin: 12.4 g/dL (ref 12.0–15.0)
Immature Granulocytes: 0 %
Lymphocytes Relative: 18 %
Lymphs Abs: 2.4 K/uL (ref 0.7–4.0)
MCH: 28.9 pg (ref 26.0–34.0)
MCHC: 31.6 g/dL (ref 30.0–36.0)
MCV: 91.6 fL (ref 80.0–100.0)
Monocytes Absolute: 0.9 K/uL (ref 0.1–1.0)
Monocytes Relative: 7 %
Neutro Abs: 9.5 K/uL — ABNORMAL HIGH (ref 1.7–7.7)
Neutrophils Relative %: 72 %
Platelets: 294 K/uL (ref 150–400)
RBC: 4.29 MIL/uL (ref 3.87–5.11)
RDW: 13.4 % (ref 11.5–15.5)
WBC: 13.3 K/uL — ABNORMAL HIGH (ref 4.0–10.5)
nRBC: 0 % (ref 0.0–0.2)

## 2023-09-28 MED ORDER — SODIUM CHLORIDE 0.9 % IV BOLUS
1000.0000 mL | Freq: Once | INTRAVENOUS | Status: AC
  Administered 2023-09-29: 1000 mL via INTRAVENOUS

## 2023-09-28 MED ORDER — TRIAMCINOLONE ACETONIDE 40 MG/ML IJ SUSP
60.0000 mg | Freq: Once | INTRAMUSCULAR | Status: AC
  Administered 2023-09-28: 60 mg via INTRAMUSCULAR

## 2023-09-28 MED ORDER — ONDANSETRON HCL 4 MG/2ML IJ SOLN
4.0000 mg | Freq: Once | INTRAMUSCULAR | Status: AC
  Administered 2023-09-29: 4 mg via INTRAVENOUS
  Filled 2023-09-28: qty 2

## 2023-09-28 MED ORDER — MORPHINE SULFATE (PF) 4 MG/ML IV SOLN
4.0000 mg | Freq: Once | INTRAVENOUS | Status: AC
  Administered 2023-09-29: 4 mg via INTRAVENOUS
  Filled 2023-09-28: qty 1

## 2023-09-28 MED ORDER — CIPROFLOXACIN HCL 500 MG PO TABS
500.0000 mg | ORAL_TABLET | Freq: Two times a day (BID) | ORAL | 0 refills | Status: DC
  Filled 2023-09-28: qty 20, 10d supply, fill #0

## 2023-09-28 NOTE — Progress Notes (Signed)
 Michelle Lamb POUR, MD  [place] Approved for US  guided drain placement in the superficial RLQ abdominal wall for post op seroma vs abscess.  HKM       Previous Messages    ----- Message ----- From: Valentina Alcoser Sent: 09/28/2023   8:26 AM EDT To: Arihaan Bellucci; Ir Procedure Requests Subject: IR US  ABSCESS DRAIN PLACEMENT and  IR US  Gui*  Procedure : IR US  ABSCESS DRAIN PLACEMENT and  IR US  Guide Bx Asp/Drain  Reason:fluid collection seen on CT scan of right abdomen surgical space s/p panniculectomy Dx: S/P panniculectomy [S01.109 (ICD-10-CM)]; Fluid collection at surgical site, initial encounter [T88.8XXA (ICD-10-CM)]    History : CT abd pelv w/ contrast , DG abd 2 views  Provider : Lenis Donnice PARAS, PA-C  Contact : [phone]

## 2023-09-28 NOTE — ED Triage Notes (Signed)
 Pt reports abscess to right side with cellulitis, redness, and pain x 5 days, pt reports she was supposed to come in tomorrow to have it drained, called on-call surgeon who advised she come in, pt reports 650mg  Tylenol  x 2 hours ago

## 2023-09-28 NOTE — Telephone Encounter (Signed)
Patient called returning your call, please reach out

## 2023-09-28 NOTE — ED Provider Notes (Signed)
 [place] [facility] AT [facility] Provider Note   CSN: [id number] Arrival date & time: 09/28/23  2052     Patient presents with: Abscess   Michelle Lamb is a 33 y.o. female.  {Add pertinent medical, surgical, social history, OB history to HPI:32947}  Abscess Michelle Lamb is a 33 y.o. female who presents to the Emergency Department complaining of *** Right side pain Monday when rolled over Panniculectomy 6/27 Saw surgeon Tuesday Wed larger - saw pcp Has fatigue, warmer. Had CT performed - abscess with cellulitis. Surgeon called this morning - tried to get in today or tomorrow.  Supposed to have US  at 1130 today for IR guided drain placement.  Fever today 101 - took tylenol . Cannot eat or drink because everything tastes like metal.  Now pain is going over entire surgery site.  Wed rocephin  and started on bactrim  - got hives after that. Received kenalog  today.  Now on cipro     Prior to Admission medications   Medication Sig Start Date End Date Taking? Authorizing Provider  albuterol  (VENTOLIN  HFA) 108 (90 Base) MCG/ACT inhaler Inhale 2 puffs into the lungs every 6 (six) hours as needed for wheezing or shortness of breath. 09/08/21   Nicholaus Credit, PA-C  ciprofloxacin  (CIPRO ) 500 MG tablet Take 1 tablet (500 mg total) by mouth 2 (two) times daily for 10 days. 09/28/23 10/08/23  Nicholaus Credit, PA-C  Continuous Glucose Sensor (FREESTYLE LIBRE 3 SENSOR) MISC Use every 14 days - apply as directed 05/10/23   Nicholaus Credit, PA-C  cyanocobalamin  (VITAMIN B12) 1000 MCG/ML injection Inject 1 ML as directed once a month 07/02/23   Nicholaus Credit, PA-C  fluticasone  (FLONASE ) 50 MCG/ACT nasal spray Place 2 sprays into both nostrils daily. Patient taking differently: Place 2 sprays into both nostrils daily as needed for allergies or rhinitis. 03/21/22   Charlene Clotilda PARAS, NP  HYDROcodone -acetaminophen  (NORCO/VICODIN) 5-325 MG tablet Take 1 - 2 tablets by mouth every 6  hours as needed. 09/27/23   Nicholaus Credit, PA-C  linaclotide  (LINZESS ) 290 MCG CAPS capsule Take one capsule on an empty stomach 30 minutes prior to first meal of the day. Keep medicine in its original container. 07/21/23     lisdexamfetamine (VYVANSE ) 70 MG capsule Take 1 capsule (70 mg total) by mouth daily. 09/11/23   Nicholaus Credit, PA-C  LORazepam  (ATIVAN ) 1 MG tablet Take 1 tablet (1 mg total) by mouth daily as needed. 09/19/23   Nicholaus Credit, PA-C  meclizine  (ANTIVERT ) 25 MG tablet Take 1 tablet (25 mg total) by mouth 3 (three) times daily as needed for dizziness. 12/07/22   Nicholaus Credit, PA-C  Multiple Vitamin (MULTIVITAMIN WITH MINERALS) TABS tablet Take 1 tablet by mouth daily.    [provider]  ondansetron  (ZOFRAN -ODT) 4 MG disintegrating tablet Take 1 tablet (4 mg total) by mouth every 8 (eight) hours as needed for nausea or vomiting. 08/09/23   Landy, Honora CROME, PA-C  pantoprazole  (PROTONIX ) 40 MG tablet Take 1 tablet (40 mg total) by mouth daily. 03/30/23   Nicholaus Credit, PA-C  Sodium Fluoride  (SODIUM FLUORIDE  5000 PPM) 1.1 % PSTE Apply 1 application to the teeth twice a day; . Floss first, then brush 2 times daily with PreviDent toothpaste. Expectorate. Do not rinse, use mouth rinse, eat, or drink for 30 min after use. 09/07/23     venlafaxine  XR (EFFEXOR  XR) 150 MG 24 hr capsule Take 1 capsule (150 mg total) by mouth daily with breakfast. 07/27/23  C[facility], Kirsten, MD  Vitamin D , Ergocalciferol , (DRISDOL ) 1.25 MG (50000 UNIT) CAPS capsule Twice weekly 05/01/23   Nicholaus Credit, PA-C    Allergies: Dilaudid  [hydromorphone ] and Sulfa  antibiotics    Review of Systems  Updated Vital Signs BP 135/87 (BP Location: Left Arm)   Pulse 82   Temp 98.3 F (36.8 C) (Oral)   Resp 18   Ht 5' 3 (1.6 m)   Wt 84.8 kg   LMP 12/04/2022 (Exact Date)   SpO2 100%   BMI 33.13 kg/m   Physical Exam  (all labs ordered are listed, but only abnormal results are displayed) Labs Reviewed  CBC WITH  DIFFERENTIAL/PLATELET - Abnormal; Notable for the following components:      Result Value   WBC 13.3 (*)    Neutro Abs 9.5 (*)    All other components within normal limits  CULTURE, BLOOD (ROUTINE X 2)  CULTURE, BLOOD (ROUTINE X 2)  BASIC METABOLIC PANEL WITH GFR    EKG: None  Radiology: CT ABDOMEN PELVIS W CONTRAST Result Date: 09/27/2023 Pt was notified of results - findings consistent with exam - abscess with cellulitis Pt already treated with Rocephin  1 gram IM in office and will send in rx for Bactrim  DS bid Pt's surgeon office contacted with findings and spoke with PA - he will have her set up for ultrasound and drain placement --- their office to contact pt regarding that appt    {Document cardiac monitor, telemetry assessment procedure when appropriate:32947} Procedures   Medications Ordered in the ED - No data to display    {Click here for ABCD2, HEART and other calculators REFRESH Note before signing:1}                              Medical Decision Making Amount and/or Complexity of Data Reviewed Labs: ordered.   ***  {Document critical care time when appropriate  Document review of labs and clinical decision tools ie CHADS2VASC2, etc  Document your independent review of radiology images and any outside records  Document your discussion with family members, caretakers and with consultants  Document social determinants of health affecting pt's care  Document your decision making why or why not admission, treatments were needed:32947:::1}   Final diagnoses:  None    ED Discharge Orders     None

## 2023-09-28 NOTE — Progress Notes (Signed)
 Patient is having an allergic reaction to Bactrim -DS after she took the first pill. She mentioned her body started itching. She took one benadryl  pill. This morning, she noticed hives and redness on her chest.  Camie Moats recommended stopped medication and ordered kenalog  60 mg IM #1 at the office.  Kenalog  shot was given and patient tolerated it well.  She stopped Bactrim -DS.

## 2023-09-29 ENCOUNTER — Encounter: Admitting: Physician Assistant

## 2023-09-29 ENCOUNTER — Inpatient Hospital Stay (HOSPITAL_COMMUNITY)

## 2023-09-29 ENCOUNTER — Ambulatory Visit (HOSPITAL_COMMUNITY)

## 2023-09-29 ENCOUNTER — Inpatient Hospital Stay (HOSPITAL_COMMUNITY): Admission: RE | Admit: 2023-09-29 | Source: Ambulatory Visit

## 2023-09-29 ENCOUNTER — Encounter (HOSPITAL_COMMUNITY): Payer: Self-pay

## 2023-09-29 DIAGNOSIS — Y839 Surgical procedure, unspecified as the cause of abnormal reaction of the patient, or of later complication, without mention of misadventure at the time of the procedure: Secondary | ICD-10-CM | POA: Diagnosis present

## 2023-09-29 DIAGNOSIS — Z9049 Acquired absence of other specified parts of digestive tract: Secondary | ICD-10-CM | POA: Diagnosis not present

## 2023-09-29 DIAGNOSIS — Z6834 Body mass index (BMI) 34.0-34.9, adult: Secondary | ICD-10-CM | POA: Diagnosis not present

## 2023-09-29 DIAGNOSIS — L03311 Cellulitis of abdominal wall: Secondary | ICD-10-CM | POA: Diagnosis present

## 2023-09-29 DIAGNOSIS — Z90710 Acquired absence of both cervix and uterus: Secondary | ICD-10-CM | POA: Diagnosis not present

## 2023-09-29 DIAGNOSIS — I129 Hypertensive chronic kidney disease with stage 1 through stage 4 chronic kidney disease, or unspecified chronic kidney disease: Secondary | ICD-10-CM | POA: Diagnosis not present

## 2023-09-29 DIAGNOSIS — Z905 Acquired absence of kidney: Secondary | ICD-10-CM | POA: Diagnosis not present

## 2023-09-29 DIAGNOSIS — T8140XA Infection following a procedure, unspecified, initial encounter: Secondary | ICD-10-CM | POA: Diagnosis present

## 2023-09-29 DIAGNOSIS — B9561 Methicillin susceptible Staphylococcus aureus infection as the cause of diseases classified elsewhere: Secondary | ICD-10-CM | POA: Diagnosis not present

## 2023-09-29 DIAGNOSIS — G4733 Obstructive sleep apnea (adult) (pediatric): Secondary | ICD-10-CM | POA: Diagnosis present

## 2023-09-29 DIAGNOSIS — F39 Unspecified mood [affective] disorder: Secondary | ICD-10-CM | POA: Diagnosis not present

## 2023-09-29 DIAGNOSIS — Z79899 Other long term (current) drug therapy: Secondary | ICD-10-CM | POA: Diagnosis not present

## 2023-09-29 DIAGNOSIS — E782 Mixed hyperlipidemia: Secondary | ICD-10-CM | POA: Diagnosis present

## 2023-09-29 DIAGNOSIS — E1122 Type 2 diabetes mellitus with diabetic chronic kidney disease: Secondary | ICD-10-CM | POA: Diagnosis not present

## 2023-09-29 DIAGNOSIS — Z885 Allergy status to narcotic agent status: Secondary | ICD-10-CM | POA: Diagnosis not present

## 2023-09-29 DIAGNOSIS — T8149XA Infection following a procedure, other surgical site, initial encounter: Secondary | ICD-10-CM | POA: Diagnosis not present

## 2023-09-29 DIAGNOSIS — F900 Attention-deficit hyperactivity disorder, predominantly inattentive type: Secondary | ICD-10-CM | POA: Diagnosis present

## 2023-09-29 DIAGNOSIS — L02211 Cutaneous abscess of abdominal wall: Secondary | ICD-10-CM | POA: Diagnosis not present

## 2023-09-29 DIAGNOSIS — T8141XD Infection following a procedure, superficial incisional surgical site, subsequent encounter: Secondary | ICD-10-CM

## 2023-09-29 DIAGNOSIS — B9620 Unspecified Escherichia coli [E. coli] as the cause of diseases classified elsewhere: Secondary | ICD-10-CM | POA: Diagnosis not present

## 2023-09-29 DIAGNOSIS — Z882 Allergy status to sulfonamides status: Secondary | ICD-10-CM | POA: Diagnosis not present

## 2023-09-29 DIAGNOSIS — Z9884 Bariatric surgery status: Secondary | ICD-10-CM | POA: Diagnosis not present

## 2023-09-29 DIAGNOSIS — E66811 Obesity, class 1: Secondary | ICD-10-CM | POA: Diagnosis not present

## 2023-09-29 DIAGNOSIS — T8141XA Infection following a procedure, superficial incisional surgical site, initial encounter: Secondary | ICD-10-CM | POA: Diagnosis not present

## 2023-09-29 DIAGNOSIS — N189 Chronic kidney disease, unspecified: Secondary | ICD-10-CM | POA: Diagnosis not present

## 2023-09-29 DIAGNOSIS — N3001 Acute cystitis with hematuria: Secondary | ICD-10-CM | POA: Diagnosis present

## 2023-09-29 DIAGNOSIS — J45909 Unspecified asthma, uncomplicated: Secondary | ICD-10-CM | POA: Diagnosis not present

## 2023-09-29 DIAGNOSIS — Z8249 Family history of ischemic heart disease and other diseases of the circulatory system: Secondary | ICD-10-CM | POA: Diagnosis not present

## 2023-09-29 LAB — URINALYSIS, ROUTINE W REFLEX MICROSCOPIC
Bilirubin Urine: NEGATIVE
Glucose, UA: NEGATIVE mg/dL
Hgb urine dipstick: NEGATIVE
Ketones, ur: NEGATIVE mg/dL
Leukocytes,Ua: NEGATIVE
Nitrite: NEGATIVE
Protein, ur: 30 mg/dL — AB
Specific Gravity, Urine: 1.024 (ref 1.005–1.030)
pH: 5 (ref 5.0–8.0)

## 2023-09-29 LAB — BASIC METABOLIC PANEL WITH GFR
Anion gap: 7 (ref 5–15)
BUN: 14 mg/dL (ref 6–20)
CO2: 22 mmol/L (ref 22–32)
Calcium: 8.2 mg/dL — ABNORMAL LOW (ref 8.9–10.3)
Chloride: 106 mmol/L (ref 98–111)
Creatinine, Ser: 0.58 mg/dL (ref 0.44–1.00)
GFR, Estimated: >60 mL/min (ref >60)
Glucose, Bld: 84 mg/dL (ref 70–99)
Potassium: 3.8 mmol/L (ref 3.5–5.1)
Sodium: 135 mmol/L (ref 135–145)

## 2023-09-29 LAB — HEPATIC FUNCTION PANEL
ALT: 20 U/L (ref 0–44)
AST: 21 U/L (ref 15–41)
Albumin: 3.8 g/dL (ref 3.5–5.0)
Alkaline Phosphatase: 80 U/L (ref 38–126)
Bilirubin, Direct: 0.1 mg/dL (ref 0.0–0.2)
Indirect Bilirubin: 0.8 mg/dL (ref 0.3–0.9)
Total Bilirubin: 0.9 mg/dL (ref 0.0–1.2)
Total Protein: 7.3 g/dL (ref 6.5–8.1)

## 2023-09-29 LAB — CBC
HCT: 35.3 % — ABNORMAL LOW (ref 36.0–46.0)
Hemoglobin: 11.1 g/dL — ABNORMAL LOW (ref 12.0–15.0)
MCH: 29.2 pg (ref 26.0–34.0)
MCHC: 31.4 g/dL (ref 30.0–36.0)
MCV: 92.9 fL (ref 80.0–100.0)
Platelets: 216 K/uL (ref 150–400)
RBC: 3.8 MIL/uL — ABNORMAL LOW (ref 3.87–5.11)
RDW: 13.4 % (ref 11.5–15.5)
WBC: 11.1 K/uL — ABNORMAL HIGH (ref 4.0–10.5)
nRBC: 0 % (ref 0.0–0.2)

## 2023-09-29 LAB — PHOSPHORUS: Phosphorus: 3.1 mg/dL (ref 2.5–4.6)

## 2023-09-29 LAB — I-STAT CG4 LACTIC ACID, ED: Lactic Acid, Venous: 0.7 mmol/L (ref 0.5–1.9)

## 2023-09-29 LAB — HIV ANTIBODY (ROUTINE TESTING W REFLEX): HIV Screen 4th Generation wRfx: NONREACTIVE

## 2023-09-29 LAB — PROTIME-INR
INR: 1.1 (ref 0.8–1.2)
Prothrombin Time: 14.5 s (ref 11.4–15.2)

## 2023-09-29 LAB — MAGNESIUM: Magnesium: 1.8 mg/dL (ref 1.7–2.4)

## 2023-09-29 MED ORDER — PANTOPRAZOLE SODIUM 40 MG PO TBEC
40.0000 mg | DELAYED_RELEASE_TABLET | Freq: Every day | ORAL | Status: DC
  Administered 2023-09-29 – 2023-10-01 (×3): 40 mg via ORAL
  Filled 2023-09-29 (×3): qty 1

## 2023-09-29 MED ORDER — OXYCODONE HCL 5 MG PO TABS: 2.5000 mg | ORAL_TABLET | ORAL | Status: DC | PRN

## 2023-09-29 MED ORDER — MIDAZOLAM HCL 2 MG/2ML IJ SOLN
INTRAMUSCULAR | Status: AC
  Filled 2023-09-29: qty 2

## 2023-09-29 MED ORDER — OXYCODONE HCL 5 MG PO TABS
5.0000 mg | ORAL_TABLET | ORAL | Status: DC | PRN
  Administered 2023-09-29 – 2023-10-01 (×8): 5 mg via ORAL
  Filled 2023-09-29 (×8): qty 1

## 2023-09-29 MED ORDER — VENLAFAXINE HCL ER 150 MG PO CP24
150.0000 mg | ORAL_CAPSULE | Freq: Every day | ORAL | Status: DC
  Administered 2023-09-29 – 2023-10-01 (×3): 150 mg via ORAL
  Filled 2023-09-29 (×3): qty 1

## 2023-09-29 MED ORDER — MIDAZOLAM HCL 2 MG/2ML IJ SOLN
INTRAMUSCULAR | Status: AC | PRN
  Administered 2023-09-29: 1 mg via INTRAVENOUS

## 2023-09-29 MED ORDER — POLYETHYLENE GLYCOL 3350 17 G PO PACK
17.0000 g | PACK | Freq: Every day | ORAL | Status: DC | PRN
  Administered 2023-09-29: 17 g via ORAL
  Filled 2023-09-29: qty 1

## 2023-09-29 MED ORDER — ONDANSETRON HCL 4 MG/2ML IJ SOLN
4.0000 mg | Freq: Once | INTRAMUSCULAR | Status: AC
  Administered 2023-09-29: 4 mg via INTRAVENOUS
  Filled 2023-09-29: qty 2

## 2023-09-29 MED ORDER — ONDANSETRON HCL 4 MG/2ML IJ SOLN: 4.0000 mg | Freq: Four times a day (QID) | INTRAMUSCULAR | Status: DC | PRN

## 2023-09-29 MED ORDER — LIDOCAINE HCL 1 % IJ SOLN
INTRAMUSCULAR | Status: AC | PRN
  Administered 2023-09-29: 10 mL via INTRADERMAL

## 2023-09-29 MED ORDER — MELATONIN 3 MG PO TABS: 6.0000 mg | ORAL_TABLET | Freq: Every evening | ORAL | Status: DC | PRN

## 2023-09-29 MED ORDER — VANCOMYCIN HCL 750 MG/150ML IV SOLN
750.0000 mg | Freq: Two times a day (BID) | INTRAVENOUS | Status: DC
  Administered 2023-09-29 – 2023-09-30 (×4): 750 mg via INTRAVENOUS
  Filled 2023-09-29 (×5): qty 150

## 2023-09-29 MED ORDER — FENTANYL CITRATE (PF) 100 MCG/2ML IJ SOLN
INTRAMUSCULAR | Status: AC
  Filled 2023-09-29: qty 2

## 2023-09-29 MED ORDER — FENTANYL CITRATE (PF) 100 MCG/2ML IJ SOLN
INTRAMUSCULAR | Status: AC | PRN
  Administered 2023-09-29: 50 ug via INTRAVENOUS

## 2023-09-29 MED ORDER — ACETAMINOPHEN 500 MG PO TABS: 1000.0000 mg | ORAL_TABLET | Freq: Four times a day (QID) | ORAL | Status: DC | PRN

## 2023-09-29 MED ORDER — SODIUM CHLORIDE 0.9% FLUSH
3.0000 mL | Freq: Two times a day (BID) | INTRAVENOUS | Status: DC
  Administered 2023-09-29 – 2023-10-01 (×5): 3 mL via INTRAVENOUS

## 2023-09-29 MED ORDER — LORAZEPAM 1 MG PO TABS: 1.0000 mg | ORAL_TABLET | Freq: Every day | ORAL | Status: DC | PRN

## 2023-09-29 MED ORDER — MORPHINE SULFATE (PF) 2 MG/ML IV SOLN: 2.0000 mg | INTRAVENOUS | Status: DC | PRN

## 2023-09-29 MED ORDER — ALBUTEROL SULFATE (2.5 MG/3ML) 0.083% IN NEBU: 2.5000 mg | INHALATION_SOLUTION | RESPIRATORY_TRACT | Status: DC | PRN

## 2023-09-29 MED ORDER — SODIUM CHLORIDE 0.9 % IV SOLN: INTRAVENOUS | Status: AC

## 2023-09-29 MED ORDER — VANCOMYCIN HCL IN DEXTROSE 1-5 GM/200ML-% IV SOLN
1000.0000 mg | Freq: Once | INTRAVENOUS | Status: AC
  Administered 2023-09-29: 1000 mg via INTRAVENOUS
  Filled 2023-09-29: qty 200

## 2023-09-29 MED ORDER — MIDAZOLAM HCL 2 MG/2ML IJ SOLN
INTRAMUSCULAR | Status: AC | PRN
Start: 2023-09-29 — End: 2023-09-29
  Administered 2023-09-29: 1 mg via INTRAVENOUS

## 2023-09-29 NOTE — Progress Notes (Signed)
 Pharmacy Antibiotic Note  Michelle Lamb is a 33 y.o. female admitted on 09/28/2023 with cellulitis with abscess.  Received Vancomycin 1g IV x 1 dose in the ED.  Pharmacy has been consulted for Vancomycin dosing.  Plan: Vancomycin 750 mg IV Q 12 hrs. Goal AUC 400-550. Expected AUC: 469.6  SCr used: 0.8 (rounded up from 0.66) Follow renal function F/u culture results & sensitivities   Height: 5' 3 (160 cm) Weight: 88.2 kg (194 lb 7.1 oz) IBW/kg (Calculated) : 52.4  Temp (24hrs), Avg:98.4 F (36.9 C), Min:98.3 F (36.8 C), Max:98.5 F (36.9 C)  Recent Labs  Lab 09/27/23 0000 09/28/23 2113 09/29/23 0122  WBC 13.7 13.3*  --   CREATININE 0.5 0.66  --   LATICACIDVEN  --   --  0.7    Estimated Creatinine Clearance: 104.3 mL/min (by C-G formula based on SCr of 0.66 mg/dL).    Allergies  Allergen Reactions   Bactrim  [Sulfamethoxazole -Trimethoprim ] Hives   Dilaudid  [Hydromorphone ] Rash   Sulfa  Antibiotics Rash    Antimicrobials this admission: 8/1 Vancomycin >>      Dose adjustments this admission:    Microbiology results: 8/1 BCx:      Thank you for allowing pharmacy to be a part of this patient's care.  Kemp Arvin Fletcher, PharmD 09/29/2023 4:51 AM

## 2023-09-29 NOTE — Progress Notes (Signed)
 [facility]  Reason for Consult: Fluid collection versus abscess of right abdomen status post panniculectomy Referring Physician: Dr. Segars  Michelle Lamb is an 33 y.o. female.  HPI: Patient is a 33 year old female with history of anemia, history of partial nephrectomy, history of Roux-en-Y gastric bypass, who underwent panniculectomy with Dr. Waddell on 08/25/2023.  She was seen in office on 09/26/2023, reported right abdomen pain and right rib pain.  Was currently being treated for E. coli UTI with ciprofloxacin  at that time.  Patient subsequently had CT scan on 09/27/2023 at [facility].   CT imaging: Oblong shape abscess in the right flank subcutaneous soft tissues overlying the right iliac crest measuring 9.0 by 4.1 by approximately 5 cm.    Patient was given 1 g Rocephin  injection at PCP office and started on Bactrim  for broad coverage, had a reaction to Bactrim  with hives.  Patient stopped Bactrim , was given Kenalog  60 mg IM at PCP office.  Patient was scheduled for ultrasound of abdomen with ultrasound-guided drainage and possible drain placement for today 09/29/2023, however called on-call provider yesterday evening and was reporting increased fatigue, increased fevers and overall  generally not feeling well.  Patient was sent to ED and evaluated in the ED.  WBC in ED 13.3, all other labs normal. Vital signs stable in the ED.  Patient admitted for IV antibiotics.  Today patient accompanied by her husband at bedside.  She reports feeling a little bit better since starting IV antibiotics.  Continuing to have pain of her right abdomen, feels as if it has become more localized to the right abdomen and no longer reporting right rib pain.  She does feel as if the left side is becoming tender as well as centrally.  Vital signs have continued to be stable.  WBC this a.m. 11.1.   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   Blood transfusion without reported diagnosis 2018   postpartum   Chest pain of uncertain etiology 06/08/2022   06/15/22 Myocardial perfusion - normal, 09/15/22 long term heart monitor in Epic   Chronic kidney disease    right kidney at 35%   Complication of anesthesia    Slow to wake up   Constipation 06/28/2022   Diabetes mellitus without complication (HCC)    NOT AN ISSUE SINCE GASTRIC BYPASS IN 2022. 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  Report date is 02/23/2022.      The CancerNext-Expanded gene panel offered by [facility] and includes sequencing, rearrangement, and RNA analysis for the following 77 genes: AIP, ALK, APC, ATM, AXIN2, BAP1, BARD1, BLM, BMPR1A, BRCA1, BRCA2, BRIP1, CDC73, CDH1, CDK4, CDKN1B, CDKN2A, CHEK2, CTNNA1, DICER1, FANCC, FH, FLCN, GALNT12, KIF1B, LZTR1,   Hepatic steatosis 09/08/2021   History of herpes simplex infection    History of partial nephrectomy 03/21/2022   Formatting of this note might be different from the original. 03/15/2021: Underwent right partial nephrectomy by  Dr. Renda for right renal neoplasm. Pathology: Angiomyolipoma. Formatting of this note might be different from the original. 03/15/2021: Underwent right partial nephrectomy by Dr. Renda for right renal neoplasm. Pathology: Angiomyolipoma.   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   Morbid obesity (HCC) 06/02/2020   Neoplasm of right kidney    benign tumor of kidney   Obesity (BMI 30.0-34.9) 06/08/2022   Obstructive sleep apnea 10/29/2019   hx of before gastric bypass, no longer uses CPAP   Racing heart beat 08/22/2022   see 09/15/22 Long term heart monitor in Epic   Seasonal allergies    Sleep apnea    Ureteral obstruction, right 07/15/2021   Vaginal delivery 2009, 2010, 2015   Vitamin D  insufficiency 09/30/2020   Weight loss 09/30/2020    Past Surgical History:  Procedure Laterality Date   ABDOMINAL HYSTERECTOMY  2024   APPENDECTOMY     CESAREAN SECTION  09/29/2016   CHOLECYSTECTOMY     COLONOSCOPY WITH ESOPHAGOGASTRODUODENOSCOPY (EGD)  07/22/2022   CYSTOSCOPY N/A 12/27/2022   Procedure: CYSTOSCOPY;  Surgeon: Glennon Almarie POUR, MD;  Location: [facility];  Service: Gynecology;  Laterality: N/A;   CYSTOSCOPY W/ URETERAL STENT PLACEMENT Right 07/15/2021   Procedure: CYSTOSCOPY WITH RETROGRADE PYELOGRAM/URETERAL STENT PLACEMENT;  Surgeon: Renda Glance, MD;  Location: [facility];  Service: Urology;  Laterality: Right;   CYSTOSCOPY WITH RETROGRADE PYELOGRAM, URETEROSCOPY AND STENT PLACEMENT Right 05/03/2021   Procedure: CYSTOSCOPY WITH RIGHT RETROGRADE PYELOGRAM, URETEROSCOPY;  Surgeon: Renda Glance, MD;  Location: [facility];  Service: Urology;  Laterality: Right;   DILATION AND CURETTAGE OF UTERUS N/A 12/31/2015   Procedure: SUCTION DILATATION AND CURETTAGE;  Surgeon: Bebe Furry, MD;  Location: [facility];  Service: Gynecology;  Laterality: N/A;   DILATION AND EVACUATION N/A 01/01/2016   Procedure: DILATATION AND EVACUATION;  Surgeon: Norleen LULLA Server, MD;  Location: [facility];  Service:  Gynecology;  Laterality: N/A;   ESOPHAGOGASTRODUODENOSCOPY     GASTRIC BYPASS  06/02/2020   IR NEPHRO TUBE REMOV/FL  07/16/2021   IR NEPHROSTOMY EXCHANGE RIGHT  05/12/2021   IR NEPHROSTOMY PLACEMENT RIGHT  05/05/2021   PANNICULECTOMY N/A 08/25/2023   Procedure: PANNICULECTOMY;  Surgeon: Waddell Leonce NOVAK, MD;  Location: [facility];  Service: Plastics;  Laterality: N/A;   ROBOT ASSISTED PYELOPLASTY Right 07/15/2021   Procedure: XI ROBOTIC ASSISTED  LAPAROSCOPIC PYELOPLASTY;  Surgeon: Renda Glance, MD;  Location: [facility];  Service: Urology;  Laterality: Right;   ROBOTIC ASSISTED LAPAROSCOPIC HYSTERECTOMY AND SALPINGECTOMY Bilateral 12/27/2022   Procedure: XI ROBOTIC ASSISTED LAPAROSCOPIC HYSTERECTOMY AND SALPINGECTOMY;  Surgeon: Glennon Almarie POUR, MD;  Location: [facility];  Service: Gynecology;  Laterality: Bilateral;   ROBOTIC ASSITED PARTIAL NEPHRECTOMY Right 03/15/2021   Procedure: XI ROBOTIC ASSITED PARTIAL NEPHRECTOMY;  Surgeon: Renda Glance, MD;  Location: [facility];  Service: Urology;  Laterality: Right;   TONSILLECTOMY     TUBAL LIGATION     UPPER GASTROINTESTINAL ENDOSCOPY     w/dilation    Family History  Problem Relation Age of Onset   Hypertension Mother    Thyroid  disease Mother  Cervical cancer Mother        dx 58s   Depression Mother    Diabetes Mother    Hyperlipidemia Mother    Anxiety disorder Mother    Asthma Father    Stomach cancer Maternal Grandmother        dx 13s   Leukemia Maternal Grandfather        d. 41s   Depression Paternal Grandmother    Diabetes Paternal Grandmother    Cancer Paternal Grandfather        dx after 11; mets   Breast cancer Maternal Great-grandmother        dx <50; mets; MGF's mother   Stomach cancer Other        MGM's brother; dx after 60   Cancer Other        MGF's sisters x3; unknown primary   ADD / ADHD Daughter    Colon cancer Neg Hx    Colon polyps Neg Hx    Esophageal cancer Neg Hx     Rectal cancer Neg Hx     Social History:  reports that she has never smoked. She has never used smokeless tobacco. She reports current alcohol use. She reports that she does not use drugs.  Allergies:  Allergies  Allergen Reactions   Bactrim  [Sulfamethoxazole -Trimethoprim ] Hives   Dilaudid  [Hydromorphone ] Rash   Sulfa  Antibiotics Rash    Medications: I have reviewed the patient's current medications.  Results for orders placed or performed during the hospital encounter of 09/28/23 (from the past 48 hours)  Basic metabolic panel     Status: None   Collection Time: 09/28/23  9:13 PM  Result Value Ref Range   Sodium 135 135 - 145 mmol/L   Potassium 3.7 3.5 - 5.1 mmol/L   Chloride 102 98 - 111 mmol/L   CO2 25 22 - 32 mmol/L   Glucose, Bld 93 70 - 99 mg/dL    Comment: Glucose reference range applies only to samples taken after fasting for at least 8 hours.   BUN 16 6 - 20 mg/dL   Creatinine, Ser 9.33 0.44 - 1.00 mg/dL   Calcium 9.1 8.9 - 89.6 mg/dL   GFR, Estimated >39 >39 mL/min    Comment: (NOTE) Calculated using the CKD-EPI Creatinine Equation (2021)    Anion gap 8 5 - 15    Comment: Performed at [facility], [address], [place], [place] [address]  CBC with Differential/Platelet     Status: Abnormal   Collection Time: 09/28/23  9:13 PM  Result Value Ref Range   WBC 13.3 (H) 4.0 - 10.5 K/uL   RBC 4.29 3.87 - 5.11 MIL/uL   Hemoglobin 12.4 12.0 - 15.0 g/dL   HCT 60.6 63.9 - 53.9 %   MCV 91.6 80.0 - 100.0 fL   MCH 28.9 26.0 - 34.0 pg   MCHC 31.6 30.0 - 36.0 g/dL   RDW 86.5 88.4 - 84.4 %   Platelets 294 150 - 400 K/uL   nRBC 0.0 0.0 - 0.2 %   Neutrophils Relative % 72 %   Neutro Abs 9.5 (H) 1.7 - 7.7 K/uL   Lymphocytes Relative 18 %   Lymphs Abs 2.4 0.7 - 4.0 K/uL   Monocytes Relative 7 %   Monocytes Absolute 0.9 0.1 - 1.0 K/uL   Eosinophils Relative 3 %   Eosinophils Absolute 0.3 0.0 - 0.5 K/uL   Basophils Relative 0 %   Basophils Absolute  0.0 0.0 - 0.1 K/uL  Immature Granulocytes 0 %   Abs Immature Granulocytes 0.04 0.00 - 0.07 K/uL    Comment: Performed at [facility], [address], [place], [place] [address]  Hepatic function panel     Status: None   Collection Time: 09/28/23  9:13 PM  Result Value Ref Range   Total Protein 7.3 6.5 - 8.1 g/dL   Albumin 3.8 3.5 - 5.0 g/dL   AST 21 15 - 41 U/L   ALT 20 0 - 44 U/L   Alkaline Phosphatase 80 38 - 126 U/L   Total Bilirubin 0.9 0.0 - 1.2 mg/dL   Bilirubin, Direct 0.1 0.0 - 0.2 mg/dL   Indirect Bilirubin 0.8 0.3 - 0.9 mg/dL    Comment: Performed at [facility], [address] [place], [place] [address]  I-Stat Lactic Acid     Status: None   Collection Time: 09/29/23  1:22 AM  Result Value Ref Range   Lactic Acid, Venous 0.7 0.5 - 1.9 mmol/L  Urinalysis, Routine w reflex microscopic -Urine, Clean Catch     Status: Abnormal   Collection Time: 09/29/23  2:47 AM  Result Value Ref Range   Color, Urine YELLOW YELLOW   APPearance HAZY (A) CLEAR   Specific Gravity, Urine 1.024 1.005 - 1.030   pH 5.0 5.0 - 8.0   Glucose, UA NEGATIVE NEGATIVE mg/dL   Hgb urine dipstick NEGATIVE NEGATIVE   Bilirubin Urine NEGATIVE NEGATIVE   Ketones, ur NEGATIVE NEGATIVE mg/dL   Protein, ur 30 (A) NEGATIVE mg/dL   Nitrite NEGATIVE NEGATIVE   Leukocytes,Ua NEGATIVE NEGATIVE   RBC / HPF 0-5 0 - 5 RBC/hpf   WBC, UA 0-5 0 - 5 WBC/hpf   Bacteria, UA FEW (A) NONE SEEN   Squamous Epithelial / HPF 11-20 0 - 5 /HPF   Mucus PRESENT     Comment: Performed at [facility], [address], [place], [place] [address]  Basic metabolic panel with GFR     Status: Abnormal   Collection Time: 09/29/23  5:32 AM  Result Value Ref Range   Sodium 135 135 - 145 mmol/L   Potassium 3.8 3.5 - 5.1 mmol/L   Chloride 106 98 - 111 mmol/L   CO2 22 22 - 32 mmol/L   Glucose, Bld 84 70 - 99 mg/dL    Comment: Glucose reference range applies only to samples  taken after fasting for at least 8 hours.   BUN 14 6 - 20 mg/dL   Creatinine, Ser 9.41 0.44 - 1.00 mg/dL   Calcium 8.2 (L) 8.9 - 10.3 mg/dL   GFR, Estimated >39 >39 mL/min    Comment: (NOTE) Calculated using the CKD-EPI Creatinine Equation (2021)    Anion gap 7 5 - 15    Comment: Performed at [facility], [address], [place], [place] [address]  CBC     Status: Abnormal   Collection Time: 09/29/23  5:32 AM  Result Value Ref Range   WBC 11.1 (H) 4.0 - 10.5 K/uL   RBC 3.80 (L) 3.87 - 5.11 MIL/uL   Hemoglobin 11.1 (L) 12.0 - 15.0 g/dL   HCT 64.6 (L) 63.9 - 53.9 %   MCV 92.9 80.0 - 100.0 fL   MCH 29.2 26.0 - 34.0 pg   MCHC 31.4 30.0 - 36.0 g/dL   RDW 86.5 88.4 - 84.4 %   Platelets 216 150 - 400 K/uL   nRBC 0.0 0.0 - 0.2 %    Comment:  Performed at [facility], [address], [place], [place] [address]  Magnesium     Status: None   Collection Time: 09/29/23  5:32 AM  Result Value Ref Range   Magnesium 1.8 1.7 - 2.4 mg/dL    Comment: Performed at [facility], [address] [place], [place] [address]  Phosphorus     Status: None   Collection Time: 09/29/23  5:32 AM  Result Value Ref Range   Phosphorus 3.1 2.5 - 4.6 mg/dL    Comment: Performed at [facility], [address], [place], [place] [address]  Protime-INR     Status: None   Collection Time: 09/29/23  5:32 AM  Result Value Ref Range   Prothrombin Time 14.5 11.4 - 15.2 seconds   INR 1.1 0.8 - 1.2    Comment: (NOTE) INR goal varies based on device and disease states. Performed at [facility], [address], [place], [place] [address]     No results found.  Review of Systems  Constitutional:  Positive for fever and malaise/fatigue.  Respiratory: Negative.    Cardiovascular: Negative.    Blood pressure 121/79, pulse 75, temperature 98.4 F (36.9 C), temperature source Oral, resp. rate 16, height 5' 3 (1.6 m),  weight 88.2 kg, last menstrual period 12/04/2022, SpO2 100%. Physical Exam Constitutional:      General: She is not in acute distress.    Appearance: Normal appearance. She is not ill-appearing, toxic-appearing or diaphoretic.  HENT:     Head: Normocephalic and atraumatic.  Abdominal:      Comments: Well-healed panniculectomy abdominal incision present.  Tenderness to palpation noted over right abdomen just superior to abdominal incision.  Erythema is noted, some firmness is noted along lateral incision.  No obvious fluid wave noted on exam, no obvious areas of fluctuance noted with palpation.  Mild tenderness noted to the left side, but no obvious fluid collection noted.  No drainage noted from incisions.  Neurological:     General: No focal deficit present.     Mental Status: She is alert.  Psychiatric:        Mood and Affect: Mood normal.        Behavior: Behavior normal.     Assessment/Plan: Fluid collection of abdomen, seroma versus abscess status post panniculectomy 08/25/2023:  Plan for IR guided drainage of right abdomen fluid collection/abscess today.   Continue with IV antibiotics per primary.  Cultures to be obtained of fluid collection for antibiotic guidance.  DVT prophylaxis per primary.  Will reevaluate patient this afternoon after IR procedure.  Appreciate primary team and radiology assistance.  All the patient's questions were answered to her content this a.m. for evaluation.  Donnice PARAS Anquinette Pierro, PA-C 09/29/2023, 7:26 AM

## 2023-09-29 NOTE — Progress Notes (Signed)
 I appreciate the care and assistance from the hospitalist and radiologist team.  Michelle Lamb is a 33 year old female who underwent an uncomplicated panniculectomy on June 27.  During the follow-up.  She has done well.  She noted Monday night that she rolled over while sleeping and had a pain in her right side which woke her from her sleep.  She said that the pain was mostly over the right flank and right iliac crest but she did have pain in the right ribs to palpation.  She rolled back to her left side and went back to sleep.  Throughout the week she has been bothered by increasing discomfort in the right side.  She also developed subjective fevers.  She was seen by her my team who did not note any significant change to her exam.  She was seen by her primary care provider who ordered a CT scan.  The CT scan was read as an abscess on the right side however the films are not available and there was no note of any rim enhancement around the fluid collection.  Arrangements were made for the patient to have an IR placed drain in the fluid collection today.  Before she was able to complete the placement of the drain she had an increase in pain and went to the emergency room at [facility] where she was evaluated and felt to have a cellulitis of her abdominal wall.  She was admitted and started on IV antibiotics.  This afternoon she underwent IR placement of a drain in the fluid collection.  The fluid was reportedly minimally turbid and consistent with a seroma.  The fluid was sent for culture.  She is currently on IV antibiotics.  On examination today she states that she is feeling better.  There is minimal to no tenderness anywhere except for the drain site.  Patient has a presumed cellulitis after surgery.  She is clearly improved since this morning.  I and my team will continue to follow her.  I appreciate all of the care she has received so far.  If it is felt appropriate that she be discharged tomorrow morning  I will see her next week in my office.  I will continue to follow the cultures and will be glad to modify the antibiotics as appropriate.  If she is not discharged we will continue to follow closely.

## 2023-09-29 NOTE — H&P (Signed)
 History and Physical    Michelle Lamb FMW:[id number] DOB: [birth year] DOA: 09/28/2023  PCP: Nicholaus Credit, PA-C   Patient coming from: Home   Chief Complaint:  Chief Complaint  Patient presents with   Abscess    HPI:  Michelle Lamb is a 33 y.o. female with hx of recent panniculectomy by plastic surgery on 6/27 who was referred to ED due to finding of abscess and cellulitis along the right flank and new onset of fever.  Patient reports she was doing well until Monday 7/28 she was rolling over in bed and felt a sudden pain on her right flank.  She was seen by her primary care physician and on 7/30 she had a CT which demonstrated a right flank abscess send cellulitis.  She was prescribed Bactrim  which she took but then developed hives and therefore stopped.  Was Rx'd ciprofloxacin  on 7/31.  Had contacted plastic surgery and was planned to have ultrasound guided drainage of the fluid collection, however due to fever yesterday and feeling generally unwell she contacted plastic surgery again who referred her to the emergency department.   Review of Systems:  ROS complete and negative except as marked above   Allergies  Allergen Reactions   Bactrim  [Sulfamethoxazole -Trimethoprim ] Hives   Dilaudid  [Hydromorphone ] Rash   Sulfa  Antibiotics Rash    Prior to Admission medications   Medication Sig Start Date End Date Taking? Authorizing Provider  albuterol  (VENTOLIN  HFA) 108 (90 Base) MCG/ACT inhaler Inhale 2 puffs into the lungs every 6 (six) hours as needed for wheezing or shortness of breath. 09/08/21  Yes Nicholaus Credit, PA-C  ciprofloxacin  (CIPRO ) 500 MG tablet Take 1 tablet (500 mg total) by mouth 2 (two) times daily for 10 days. 09/28/23 10/08/23 Yes Nicholaus Credit, PA-C  cyanocobalamin  (VITAMIN B12) 1000 MCG/ML injection Inject 1 ML as directed once a month 07/02/23  Yes Nicholaus Credit, PA-C  fluticasone  (FLONASE ) 50 MCG/ACT nasal spray Place 2 sprays into both nostrils daily. Patient  taking differently: Place 2 sprays into both nostrils daily as needed for allergies or rhinitis. 03/21/22  Yes Charlene Clotilda PARAS, NP  HYDROcodone -acetaminophen  (NORCO/VICODIN) 5-325 MG tablet Take 1 - 2 tablets by mouth every 6 hours as needed. 09/27/23  Yes Nicholaus Credit, PA-C  linaclotide  (LINZESS ) 290 MCG CAPS capsule Take one capsule on an empty stomach 30 minutes prior to first meal of the day. Keep medicine in its original container. 07/21/23  Yes   lisdexamfetamine (VYVANSE ) 70 MG capsule Take 1 capsule (70 mg total) by mouth daily. 09/11/23  Yes Nicholaus Credit, PA-C  LORazepam  (ATIVAN ) 1 MG tablet Take 1 tablet (1 mg total) by mouth daily as needed. 09/19/23  Yes Nicholaus Credit, PA-C  meclizine  (ANTIVERT ) 25 MG tablet Take 1 tablet (25 mg total) by mouth 3 (three) times daily as needed for dizziness. 12/07/22  Yes Nicholaus Credit, PA-C  Multiple Vitamin (MULTIVITAMIN WITH MINERALS) TABS tablet Take 1 tablet by mouth daily.   Yes [provider]  ondansetron  (ZOFRAN -ODT) 4 MG disintegrating tablet Take 1 tablet (4 mg total) by mouth every 8 (eight) hours as needed for nausea or vomiting. 08/09/23  Yes Landy Honora CROME, PA-C  pantoprazole  (PROTONIX ) 40 MG tablet Take 1 tablet (40 mg total) by mouth daily. 03/30/23  Yes Nicholaus Credit, PA-C  Sodium Fluoride  (SODIUM FLUORIDE  5000 PPM) 1.1 % PSTE Apply 1 application to the teeth twice a day; . Floss first, then brush 2 times daily with PreviDent toothpaste. Expectorate. Do not rinse, use  mouth rinse, eat, or drink for 30 min after use. 09/07/23  Yes   venlafaxine  XR (EFFEXOR  XR) 150 MG 24 hr capsule Take 1 capsule (150 mg total) by mouth daily with breakfast. 07/27/23  Yes Cox, Kirsten, MD  Vitamin D , Ergocalciferol , (DRISDOL ) 1.25 MG (50000 UNIT) CAPS capsule Twice weekly Patient taking differently: Take 50,000 Units by mouth 2 (two) times a week. 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   Blood transfusion without reported diagnosis 2018   postpartum   Chest pain of uncertain etiology 06/08/2022   06/15/22 Myocardial perfusion - normal, 09/15/22 long term heart monitor in Epic   Chronic kidney disease    right kidney at 35%   Complication of anesthesia    Slow to wake up   Constipation 06/28/2022   Diabetes mellitus without complication (HCC)    NOT AN ISSUE SINCE GASTRIC BYPASS IN 2022. 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  Report date is 02/23/2022.      The CancerNext-Expanded gene panel offered by [facility] and includes sequencing, rearrangement, and RNA analysis for the following 77 genes: AIP, ALK, APC, ATM, AXIN2, BAP1, BARD1, BLM, BMPR1A, BRCA1, BRCA2, BRIP1, CDC73, CDH1, CDK4, CDKN1B, CDKN2A, CHEK2, CTNNA1, DICER1, FANCC, FH, FLCN, GALNT12, KIF1B, LZTR1,   Hepatic steatosis 09/08/2021   History of herpes simplex infection    History of partial nephrectomy 03/21/2022   Formatting of this note might be different from the original. 03/15/2021: Underwent right partial nephrectomy by Dr. Renda for right renal neoplasm. Pathology: Angiomyolipoma. Formatting of this note might be different from the original. 03/15/2021: Underwent right partial nephrectomy by Dr. Renda for right renal neoplasm. Pathology: Angiomyolipoma.    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   Morbid obesity (HCC) 06/02/2020   Neoplasm of right kidney    benign tumor of kidney   Obesity (BMI 30.0-34.9) 06/08/2022   Obstructive sleep apnea 10/29/2019   hx of before gastric bypass, no longer uses CPAP   Racing heart beat 08/22/2022   see 09/15/22 Long term heart monitor in Epic   Seasonal allergies    Sleep apnea    Ureteral obstruction, right 07/15/2021   Vaginal delivery 2009, 2010, 2015   Vitamin D  insufficiency 09/30/2020   Weight loss 09/30/2020    Past Surgical History:  Procedure Laterality Date   ABDOMINAL HYSTERECTOMY  2024   APPENDECTOMY  CESAREAN SECTION  09/29/2016   CHOLECYSTECTOMY     COLONOSCOPY WITH ESOPHAGOGASTRODUODENOSCOPY (EGD)  07/22/2022   CYSTOSCOPY N/A 12/27/2022   Procedure: CYSTOSCOPY;  Surgeon: Glennon Almarie POUR, MD;  Location: [facility];  Service: Gynecology;  Laterality: N/A;   CYSTOSCOPY W/ URETERAL STENT PLACEMENT Right 07/15/2021   Procedure: CYSTOSCOPY WITH RETROGRADE PYELOGRAM/URETERAL STENT PLACEMENT;  Surgeon: Renda Glance, MD;  Location: [facility];  Service: Urology;  Laterality: Right;   CYSTOSCOPY WITH RETROGRADE PYELOGRAM, URETEROSCOPY AND STENT PLACEMENT Right 05/03/2021   Procedure: CYSTOSCOPY WITH RIGHT RETROGRADE PYELOGRAM, URETEROSCOPY;  Surgeon: Renda Glance, MD;  Location: [facility];  Service: Urology;  Laterality: Right;   DILATION AND CURETTAGE OF UTERUS N/A 12/31/2015   Procedure: SUCTION DILATATION AND CURETTAGE;  Surgeon: Bebe Furry, MD;  Location: [facility];  Service: Gynecology;  Laterality: N/A;   DILATION  AND EVACUATION N/A 01/01/2016   Procedure: DILATATION AND EVACUATION;  Surgeon: Norleen LULLA Server, MD;  Location: [facility];  Service: Gynecology;  Laterality: N/A;   ESOPHAGOGASTRODUODENOSCOPY     GASTRIC BYPASS  06/02/2020   IR NEPHRO TUBE REMOV/FL  07/16/2021   IR NEPHROSTOMY EXCHANGE RIGHT  05/12/2021   IR NEPHROSTOMY PLACEMENT RIGHT  05/05/2021   PANNICULECTOMY N/A 08/25/2023   Procedure: PANNICULECTOMY;  Surgeon: Waddell Leonce NOVAK, MD;  Location: [facility];  Service: Plastics;  Laterality: N/A;   ROBOT ASSISTED PYELOPLASTY Right 07/15/2021   Procedure: XI ROBOTIC ASSISTED  LAPAROSCOPIC PYELOPLASTY;  Surgeon: Renda Glance, MD;  Location: [facility];  Service: Urology;  Laterality: Right;   ROBOTIC ASSISTED LAPAROSCOPIC HYSTERECTOMY AND SALPINGECTOMY Bilateral 12/27/2022   Procedure: XI ROBOTIC ASSISTED LAPAROSCOPIC HYSTERECTOMY AND SALPINGECTOMY;  Surgeon: Glennon Almarie POUR, MD;  Location: [facility];  Service: Gynecology;  Laterality: Bilateral;   ROBOTIC ASSITED PARTIAL NEPHRECTOMY Right 03/15/2021   Procedure: XI ROBOTIC ASSITED PARTIAL NEPHRECTOMY;  Surgeon: Renda Glance, MD;  Location: [facility];  Service: Urology;  Laterality: Right;   TONSILLECTOMY     TUBAL LIGATION     UPPER GASTROINTESTINAL ENDOSCOPY     w/dilation     reports that she has never smoked. She has never used smokeless tobacco. She reports current alcohol use. She reports that she does not use drugs.  Family History  Problem Relation Age of Onset   Hypertension Mother    Thyroid  disease Mother    Cervical cancer Mother        dx 58s   Depression Mother    Diabetes Mother    Hyperlipidemia Mother    Anxiety disorder Mother    Asthma Father    Stomach cancer Maternal Grandmother        dx 71s   Leukemia Maternal Grandfather        d. 35s   Depression Paternal Grandmother    Diabetes Paternal Grandmother    Cancer Paternal Grandfather        dx after 65; mets   Breast  cancer Maternal Great-grandmother        dx <50; mets; MGF's mother   Stomach cancer Other        MGM's brother; dx after 10   Cancer Other        MGF's sisters x3; unknown primary   ADD / ADHD Daughter    Colon cancer Neg Hx    Colon polyps Neg Hx    Esophageal cancer Neg Hx    Rectal cancer Neg Hx      Physical Exam: Vitals:   09/28/23  2100 09/28/23 2101 09/29/23 0000 09/29/23 0239  BP: 135/87  (!) 134/90 121/79  Pulse: 82  80 75  Resp: 18  16 16   Temp: 98.3 F (36.8 C)  98.5 F (36.9 C) 98.4 F (36.9 C)  TempSrc: Oral   Oral  SpO2: 100%  100% 100%  Weight:  84.8 kg  88.2 kg  Height:  5' 3 (1.6 m)  5' 3 (1.6 m)    Gen: Awake, alert, NAD   CV: Regular, normal S1, S2, no murmurs  Resp: Normal WOB, CTAB  Abd: Flat, normoactive, nontender MSK: Symmetric, no edema  Skin: Well-healing panniculectomy, along the right lower flank /overlying the right iliac crest there is a fluctuant area with tenderness Neuro: Alert and interactive  Psych: euthymic, appropriate    Data review:   Labs reviewed, notable for:   WBC 13 Lactate 0.7   Micro:  Results for orders placed or performed in visit on 09/11/23  Urine Culture     Status: Abnormal   Collection Time: 09/11/23  4:09 PM   Specimen: Urine   UR  Result Value Ref Range Status   Urine Culture, Routine Final report (A)  Final   Organism ID, Bacteria Escherichia coli (A)  Final    Comment: Cefazolin  with an MIC <=16 predicts susceptibility to the oral agents cefaclor, cefdinir, cefpodoxime, cefprozil, cefuroxime, cephalexin , and loracarbef when used for therapy of uncomplicated urinary tract infections due to E. coli, Klebsiella pneumoniae, and Proteus mirabilis. Multi-Drug Resistant Organism Greater than 100,000 colony forming units per mL    Antimicrobial Susceptibility Comment  Final    Comment:       ** S = Susceptible; I = Intermediate; R = Resistant **                    P = Positive; N = Negative              MICS are expressed in micrograms per mL    Antibiotic                 RSLT#1    RSLT#2    RSLT#3    RSLT#4 Amoxicillin /Clavulanic Acid    S =4 Ampicillin                     R>=32 Cefazolin                       S =4 Cefepime                       S<=0.12 Cefoxitin                       S<=4 Cefpodoxime                    S<=0.25 Ceftriaxone                     S<=0.25 Ciprofloxacin                   S<=0.06 Ertapenem                      S<=0.12 Gentamicin                     R>=16 Levofloxacin                   S<=0.12 Meropenem  S<=0.25 Nitrofurantoin                  S<=16 Piperacillin Nadine        S<=4 Tetracycline                   R>=16 Tobramycin                     I =8 Trimethoprim /Sulfa              R>=320     Imaging reviewed:  No results found.;   Recent OP CT A/P w contrast on 7/30:    ED Course:  EDP discussed with plastic surgery provider on-call who will consult in the morning.   Assessment/Plan:  33 y.o. female with hx recent panniculectomy by plastic surgery on 6/27 who was referred to ED due to finding of abscess and cellulitis along the right flank and new onset of fever.    R flank cellulitis with abscess, post op s/p Panniculectomy 6/27  - EDP discussed with plastic surgery provider on-call who will consult in the morning. - Continue vancomycin pharmacy to dose - Keep n.p.o. for anticipated procedure - S/p 1 L IV fluid, continue MIVF NS at 100 cc an hour - Symptomatic management  Tylenol  as needed mild, oxycodone  2.5/5 mg for moderate/severe, Morphine  2 mg IV every 4 hours as needed for breakthrough  Chronic medical problems: Mood disorder: Continue home venlafaxine , Ativan  prn ADHD: Says she does not need Vyvanse  inpatient  Body mass index is 34.44 kg/m.  Obesity class I, will benefit from weight loss outpatient  DVT prophylaxis:  SCDs Code Status:  Full Code Diet:  Diet Orders (From admission, onward)     Start      Ordered   09/29/23 0441  Diet NPO time specified Except for: Sips with Meds  Diet effective now       Question:  Except for  Answer:  Noralyn with Meds   09/29/23 0443           Family Communication: None Consults: Plastic surgery Admission status:   Inpatient, Med-Surg  Severity of Illness: The appropriate patient status for this patient is INPATIENT. Inpatient status is judged to be reasonable and necessary in order to provide the required intensity of service to ensure the patient's safety. The patient's presenting symptoms, physical exam findings, and initial radiographic and laboratory data in the context of their chronic comorbidities is felt to place them at high risk for further clinical deterioration. Furthermore, it is not anticipated that the patient will be medically stable for discharge from the hospital within 2 midnights of admission.   * I certify that at the point of admission it is my clinical judgment that the patient will require inpatient hospital care spanning beyond 2 midnights from the point of admission due to high intensity of service, high risk for further deterioration and high frequency of surveillance required.*   Dorn Dawson, MD [facility]  How to contact the [facility] Attending or Consulting provider 7A - 7P or covering provider during after hours 7P -7A, for this patient.  Check the care team in [facility] and look for a) attending/consulting [facility] provider listed and b) the [facility] team listed Log into [email or web address] and use 's universal password to access. If you do not have the password, please contact the hospital operator. Locate the [facility] provider you are looking for under [facility] and page to a number that you can be directly reached.  If you still have difficulty reaching the provider, please page the [facility] (Director on Call) for the Hospitalists listed on amion for assistance.  09/29/2023, 6:06 AM

## 2023-09-29 NOTE — Progress Notes (Signed)
 This encounter was created in error - please disregard.

## 2023-09-29 NOTE — Progress Notes (Signed)
 Subjective: Patient admitted this morning, see detailed H&P by Dr Keturah 33 y.o. female with hx of recent panniculectomy by plastic surgery on 6/27 who was referred to ED due to finding of abscess and cellulitis along the right flank and new onset of fever.  Patient reports she was doing well until Monday 7/28 she was rolling over in bed and felt a sudden pain on her right flank.  She was seen by her primary care physician and on 7/30 she had a CT which demonstrated a right flank abscess send cellulitis.  She was prescribed Bactrim  which she took but then developed hives and therefore stopped.  Was Rx'd ciprofloxacin  on 7/31.  Had contacted plastic surgery and was planned to have ultrasound guided drainage of the fluid collection, however due to fever yesterday and feeling generally unwell she contacted plastic surgery again who referred her to the emergency department.   Vitals:   09/29/23 0239 09/29/23 0731  BP: 121/79 106/66  Pulse: 75 66  Resp: 16 16  Temp: 98.4 F (36.9 C) 98.3 F (36.8 C)  SpO2: 100% 99%      A/P  R flank cellulitis with abscess, post op s/p Panniculectomy 6/27  -CT scan obtained by PCP as outpatient showed right abdominal subcutaneous fluid collection -Started on vancomycin per pharmacy -Patient was supposed to get drainage with IR today as outpatient -Discussed with plastic surgery, will get IR evaluation and management to drain the abscess and put drain  - Symptomatic management  Tylenol  as needed mild, oxycodone  2.5/5 mg for moderate/severe, Morphine  2 mg IV every 4 hours as needed for breakthrough    Mood disorder: Continue home venlafaxine , Ativan  prn  ADHD: Says she does not need Vyvanse  inpatient   Sabas GORMAN Brod [facility]

## 2023-09-29 NOTE — Consult Note (Signed)
 Chief Complaint: Right subcutaneous abscess - image guided percutaneous abscess drain placement   Referring Provider(s): Drusilla Mori   Supervising Physician: Luverne Aran  Patient Status: Michelle Lamb - In-pt  History of Present Illness: Michelle Lamb is a 33 y.o. female with history of anxiety, arthritis, asthma, chronic kidney disease, abnormal menses, Roux-en-Y gastric bypass, and panniculectomy August 25, 2023.  Patient is followed by Dr. Lenis of plastic surgery who obtained CT scan on 09/27/2023 revealing right flank abscess.  The patient was prescribed Bactrim  and reportedly developed hives, causing her not to complete the course; patient was prescribed ciprofloxacin  on 09/28/2023.  Plastic surgery referred her to interventional radiology for image guided percutaneous abscess drain placement and she was scheduled for today, 09/29/2023 at 1:30 PM, as an outpatient.  Patient presented to the emergency department 09/28/2023 after development of subjective fevers and feeling generally unwell.  Patient was admitted for further diagnostic workup and treatment.  Interventional radiology was consulted for possible image guided percutaneous abscess drain placement.  Imaging was reviewed and approved by Dr. Luverne 09/29/2023.   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   Blood transfusion without reported diagnosis 2018   postpartum   Chest pain of uncertain etiology 06/08/2022   06/15/22 Myocardial perfusion - normal, 09/15/22 long term heart monitor in Epic   Chronic kidney disease    right  kidney at 35%   Complication of anesthesia    Slow to wake up   Constipation 06/28/2022   Diabetes mellitus without complication (HCC)    NOT AN ISSUE SINCE GASTRIC BYPASS IN 2022. 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  Report date is 02/23/2022.      The CancerNext-Expanded gene panel offered by [facility] and includes sequencing, rearrangement, and RNA analysis for the following 77 genes: AIP, ALK, APC, ATM, AXIN2, BAP1, BARD1, BLM, BMPR1A, BRCA1, BRCA2, BRIP1, CDC73, CDH1, CDK4, CDKN1B, CDKN2A, CHEK2, CTNNA1, DICER1, FANCC, FH, FLCN, GALNT12, KIF1B, LZTR1,   Hepatic steatosis 09/08/2021   History of herpes simplex infection    History of partial nephrectomy 03/21/2022   Formatting of this note might be different from the original. 03/15/2021: Underwent right partial nephrectomy by Dr. Renda for right renal neoplasm. Pathology: Angiomyolipoma. Formatting of this note might be different from the original. 03/15/2021: Underwent right partial nephrectomy by Dr. Renda for right renal neoplasm. Pathology: Angiomyolipoma.   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   Morbid obesity (HCC) 06/02/2020   Neoplasm of right kidney    benign tumor of kidney  Obesity (BMI 30.0-34.9) 06/08/2022   Obstructive sleep apnea 10/29/2019   hx of before gastric bypass, no longer uses CPAP   Racing heart beat 08/22/2022   see 09/15/22 Long term heart monitor in Epic   Seasonal  allergies    Sleep apnea    Ureteral obstruction, right 07/15/2021   Vaginal delivery 2009, 2010, 2015   Vitamin D  insufficiency 09/30/2020   Weight loss 09/30/2020    Past Surgical History:  Procedure Laterality Date   ABDOMINAL HYSTERECTOMY  2024   APPENDECTOMY     CESAREAN SECTION  09/29/2016   CHOLECYSTECTOMY     COLONOSCOPY WITH ESOPHAGOGASTRODUODENOSCOPY (EGD)  07/22/2022   CYSTOSCOPY N/A 12/27/2022   Procedure: CYSTOSCOPY;  Surgeon: Glennon Almarie POUR, MD;  Location: [facility];  Service: Gynecology;  Laterality: N/A;   CYSTOSCOPY W/ URETERAL STENT PLACEMENT Right 07/15/2021   Procedure: CYSTOSCOPY WITH RETROGRADE PYELOGRAM/URETERAL STENT PLACEMENT;  Surgeon: Renda Glance, MD;  Location: [facility];  Service: Urology;  Laterality: Right;   CYSTOSCOPY WITH RETROGRADE PYELOGRAM, URETEROSCOPY AND STENT PLACEMENT Right 05/03/2021   Procedure: CYSTOSCOPY WITH RIGHT RETROGRADE PYELOGRAM, URETEROSCOPY;  Surgeon: Renda Glance, MD;  Location: [facility];  Service: Urology;  Laterality: Right;   DILATION AND CURETTAGE OF UTERUS N/A 12/31/2015   Procedure: SUCTION DILATATION AND CURETTAGE;  Surgeon: Bebe Furry, MD;  Location: [facility];  Service: Gynecology;  Laterality: N/A;   DILATION AND EVACUATION N/A 01/01/2016   Procedure: DILATATION AND EVACUATION;  Surgeon: Norleen LULLA Server, MD;  Location: [facility];  Service: Gynecology;  Laterality: N/A;   ESOPHAGOGASTRODUODENOSCOPY     GASTRIC BYPASS  06/02/2020   IR NEPHRO TUBE REMOV/FL  07/16/2021   IR NEPHROSTOMY EXCHANGE RIGHT  05/12/2021   IR NEPHROSTOMY PLACEMENT RIGHT  05/05/2021   PANNICULECTOMY N/A 08/25/2023   Procedure: PANNICULECTOMY;  Surgeon: Waddell Leonce NOVAK, MD;  Location: [facility];  Service: Plastics;  Laterality: N/A;   ROBOT ASSISTED PYELOPLASTY Right 07/15/2021   Procedure: XI ROBOTIC ASSISTED  LAPAROSCOPIC PYELOPLASTY;  Surgeon: Renda Glance, MD;  Location: [facility];  Service: Urology;   Laterality: Right;   ROBOTIC ASSISTED LAPAROSCOPIC HYSTERECTOMY AND SALPINGECTOMY Bilateral 12/27/2022   Procedure: XI ROBOTIC ASSISTED LAPAROSCOPIC HYSTERECTOMY AND SALPINGECTOMY;  Surgeon: Glennon Almarie POUR, MD;  Location: [facility];  Service: Gynecology;  Laterality: Bilateral;   ROBOTIC ASSITED PARTIAL NEPHRECTOMY Right 03/15/2021   Procedure: XI ROBOTIC ASSITED PARTIAL NEPHRECTOMY;  Surgeon: Renda Glance, MD;  Location: [facility];  Service: Urology;  Laterality: Right;   TONSILLECTOMY     TUBAL LIGATION     UPPER GASTROINTESTINAL ENDOSCOPY     w/dilation    Allergies: Bactrim  [sulfamethoxazole -trimethoprim ], Dilaudid  [hydromorphone ], and Sulfa  antibiotics  Medications: Prior to Admission medications   Medication Sig Start Date End Date Taking? Authorizing Provider  albuterol  (VENTOLIN  HFA) 108 (90 Base) MCG/ACT inhaler Inhale 2 puffs into the lungs every 6 (six) hours as needed for wheezing or shortness of breath. 09/08/21  Yes Nicholaus Credit, PA-C  ciprofloxacin  (CIPRO ) 500 MG tablet Take 1 tablet (500 mg total) by mouth 2 (two) times daily for 10 days. 09/28/23 10/08/23 Yes Nicholaus Credit, PA-C  cyanocobalamin  (VITAMIN B12) 1000 MCG/ML injection Inject 1 ML as directed once a month 07/02/23  Yes Nicholaus Credit, PA-C  fluticasone  (FLONASE ) 50 MCG/ACT nasal spray Place 2 sprays into both nostrils daily. Patient taking differently: Place 2 sprays into both nostrils daily as needed for allergies or rhinitis. 03/21/22  Yes Charlene Clotilda PARAS, NP  HYDROcodone -acetaminophen  (  NORCO/VICODIN) 5-325 MG tablet Take 1 - 2 tablets by mouth every 6 hours as needed. 09/27/23  Yes Nicholaus Credit, PA-C  linaclotide  (LINZESS ) 290 MCG CAPS capsule Take one capsule on an empty stomach 30 minutes prior to first meal of the day. Keep medicine in its original container. 07/21/23  Yes   lisdexamfetamine (VYVANSE ) 70 MG capsule Take 1 capsule (70 mg total) by mouth daily. 09/11/23  Yes Nicholaus Credit, PA-C   LORazepam  (ATIVAN ) 1 MG tablet Take 1 tablet (1 mg total) by mouth daily as needed. 09/19/23  Yes Nicholaus Credit, PA-C  meclizine  (ANTIVERT ) 25 MG tablet Take 1 tablet (25 mg total) by mouth 3 (three) times daily as needed for dizziness. 12/07/22  Yes Nicholaus Credit, PA-C  Multiple Vitamin (MULTIVITAMIN WITH MINERALS) TABS tablet Take 1 tablet by mouth daily.   Yes [provider]  ondansetron  (ZOFRAN -ODT) 4 MG disintegrating tablet Take 1 tablet (4 mg total) by mouth every 8 (eight) hours as needed for nausea or vomiting. 08/09/23  Yes Landy Honora CROME, PA-C  pantoprazole  (PROTONIX ) 40 MG tablet Take 1 tablet (40 mg total) by mouth daily. 03/30/23  Yes Nicholaus Credit, PA-C  Sodium Fluoride  (SODIUM FLUORIDE  5000 PPM) 1.1 % PSTE Apply 1 application to the teeth twice a day; . Floss first, then brush 2 times daily with PreviDent toothpaste. Expectorate. Do not rinse, use mouth rinse, eat, or drink for 30 min after use. 09/07/23  Yes   venlafaxine  XR (EFFEXOR  XR) 150 MG 24 hr capsule Take 1 capsule (150 mg total) by mouth daily with breakfast. 07/27/23  Yes Cox, Kirsten, MD  Vitamin D , Ergocalciferol , (DRISDOL ) 1.25 MG (50000 UNIT) CAPS capsule Twice weekly Patient taking differently: Take 50,000 Units by mouth 2 (two) times a week. Twice weekly 05/01/23  Yes Nicholaus Credit, PA-C  Continuous Glucose Sensor (FREESTYLE LIBRE 3 SENSOR) MISC Use every 14 days - apply as directed 05/10/23   Nicholaus Credit, PA-C     Family History  Problem Relation Age of Onset   Hypertension Mother    Thyroid  disease Mother    Cervical cancer Mother        dx 46s   Depression Mother    Diabetes Mother    Hyperlipidemia Mother    Anxiety disorder Mother    Asthma Father    Stomach cancer Maternal Grandmother        dx 67s   Leukemia Maternal Grandfather        d. 41s   Depression Paternal Grandmother    Diabetes Paternal Grandmother    Cancer Paternal Grandfather        dx after 78; mets   Breast cancer Maternal  Great-grandmother        dx <50; mets; MGF's mother   Stomach cancer Other        MGM's brother; dx after 64   Cancer Other        MGF's sisters x3; unknown primary   ADD / ADHD Daughter    Colon cancer Neg Hx    Colon polyps Neg Hx    Esophageal cancer Neg Hx    Rectal cancer Neg Hx     Social History   Socioeconomic History   Marital status: Married    Spouse name: Not on file   Number of children: 9   Years of education: 13   Highest education level: Associate degree: academic program  Occupational History   Occupation: Scientist, forensic  Tobacco Use   Smoking status:  Never   Smokeless tobacco: Never  Vaping Use   Vaping status: Never Used  Substance and Sexual Activity   Alcohol use: Yes    Comment: occasionally   Drug use: Never   Sexual activity: Not Currently    Partners: Male    Birth control/protection: Surgical    Comment: tubal, hysterectomy  Other Topics Concern   Not on file  Social History Narrative   Not on file   Social Drivers of Health   Financial Resource Strain: Low Risk  (09/26/2023)   Overall Financial Resource Strain (CARDIA)    Difficulty of Paying Living Expenses: Not hard at all  Food Insecurity: No Food Insecurity (09/29/2023)   Hunger Vital Sign    Worried About Running Out of Food in the Last Year: Never true    Ran Out of Food in the Last Year: Never true  Transportation Needs: No Transportation Needs (09/29/2023)   PRAPARE - Administrator, Civil Service (Medical): No    Lack of Transportation (Non-Medical): No  Physical Activity: Insufficiently Active (09/26/2023)   Exercise Vital Sign    Days of Exercise per Week: 3 days    Minutes of Exercise per Session: 20 min  Stress: Stress Concern Present (09/26/2023)   [employer] of Occupational Health - Occupational Stress Questionnaire    Feeling of Stress: To some extent  Social Connections: Socially Integrated (09/26/2023)   Social Connection and Isolation  Panel    Frequency of Communication with Friends and Family: More than three times a week    Frequency of Social Gatherings with Friends and Family: More than three times a week    Attends Religious Services: 1 to 4 times per year    Active Member of [employer] or Organizations: Yes    Attends Banker Meetings: 1 to 4 times per year    Marital Status: Married     Review of Systems: A 12 point ROS discussed and pertinent positives are indicated in the HPI above.  All other systems are negative.  Review of Systems  Constitutional:  Negative for chills, fatigue and fever.  Respiratory:  Negative for cough, shortness of breath and wheezing.   Gastrointestinal:  Negative for diarrhea, nausea and vomiting.  Neurological:  Negative for dizziness and headaches.  Psychiatric/Behavioral:  Negative for agitation, behavioral problems and confusion.     Vital Signs: BP 106/66 (BP Location: Left Arm)   Pulse 66   Temp 98.3 F (36.8 C) (Oral)   Resp 16   Ht 5' 3 (1.6 m)   Wt 194 lb 7.1 oz (88.2 kg)   LMP 12/04/2022 (Exact Date)   SpO2 99%   BMI 34.44 kg/m   Advance Care Plan: The advanced care place/surrogate decision maker was discussed at the time of visit and the patient did not wish to discuss or was not able to name a surrogate decision maker or provide an advance care plan.  Physical Exam Constitutional:      Appearance: She is well-developed.  HENT:     Head: Atraumatic.     Mouth/Throat:     Mouth: Mucous membranes are moist.  Cardiovascular:     Rate and Rhythm: Normal rate and regular rhythm.     Heart sounds: No murmur heard. Pulmonary:     Effort: Pulmonary effort is normal.     Breath sounds: Normal breath sounds.  Abdominal:     General: Bowel sounds are normal.     Palpations: Abdomen is soft.  Musculoskeletal:        General: Normal range of motion.  Skin:    General: Skin is warm.  Neurological:     Mental Status: She is alert and oriented to person,  place, and time.  Psychiatric:        Mood and Affect: Mood normal.        Behavior: Behavior normal.     Imaging: CT ABDOMEN PELVIS W CONTRAST Result Date: 09/27/2023 Pt was notified of results - findings consistent with exam - abscess with cellulitis Pt already treated with Rocephin  1 gram IM in office and will send in rx for Bactrim  DS bid Pt's surgeon office contacted with findings and spoke with PA - he will have her set up for ultrasound and drain placement --- their office to contact pt regarding that appt   DG Abd 2 Views Result Date: 09/26/2023 CLINICAL DATA:  Abdominal pain EXAM: ABDOMEN - 2 VIEW COMPARISON:  Abdominal x-ray 07/15/2021. FINDINGS: The bowel gas pattern is normal. There is moderate stool burden. Cholecystectomy clips are present. There is no evidence of free air. No radio-opaque calculi or other significant radiographic abnormality is seen. IMPRESSION: Negative. Electronically Signed   By: Greig Pique M.D.   On: 09/26/2023 19:53    Labs:  CBC: Recent Labs    08/11/23 1205 09/27/23 0000 09/28/23 2113 09/29/23 0532  WBC 9.4 13.7 13.3* 11.1*  HGB 13.8 12.7 12.4 11.1*  HCT 42.6 38 39.3 35.3*  PLT 307 250 294 216    COAGS: Recent Labs    09/29/23 0532  INR 1.1    BMP: Recent Labs    03/28/23 1317 05/01/23 1203 09/27/23 0000 09/28/23 2113 09/29/23 0532  NA 139 137 135* 135 135  K 4.3 4.5 3.8 3.7 3.8  CL 103 100 101 102 106  CO2 21 24 26* 25 22  GLUCOSE 80 74  --  93 84  BUN 15 12 12 16 14   CALCIUM 9.3 9.2 9.0 9.1 8.2*  CREATININE 0.60 0.51* 0.5 0.66 0.58  GFRNONAA  --   --   --  >60 >60    LIVER FUNCTION TESTS: Recent Labs    12/07/22 0916 03/28/23 1317 05/01/23 1203 09/27/23 0000 09/28/23 2113  BILITOT 0.7 0.6 0.5  --  0.9  AST 19 40 28 23 21   ALT 23 58* 28 23 20   ALKPHOS 86 99 91 89 80  PROT 6.3 6.8 6.9  --  7.3  ALBUMIN 4.3 4.6 4.6 4.1 3.8    TUMOR MARKERS: No results for input(s): AFPTM, CEA, CA199, CHROMGRNA in  the last 8760 hours.  Assessment and Plan:  Patient is with right subcutaneous abscess scheduled for image guided percutaneous abscess drain placement 09/29/2023.  Imaging was reviewed and approved by Dr. Luverne.  Risks and benefits discussed with the patient including bleeding, infection, damage to adjacent structures, bowel perforation/fistula connection, and sepsis.  All of the patient's questions were answered, patient is agreeable to proceed. Consent signed and in chart.  Thank you for allowing our service to participate in Doneisha Ivey 's care.  Electronically Signed: Lavanda JAYSON Jurist, PA-C   09/29/2023, 9:39 AM    I spent a total of 20 Minutes    in face to face in clinical consultation, greater than 50% of which was counseling/coordinating care for image guided percutaneous abscess drain placement.

## 2023-09-29 NOTE — Procedures (Signed)
 Interventional Radiology Procedure Note  Procedure: CT guided drainage of right abdominal wall fluid collection  Complications: None  Estimated Blood Loss: <5 mL  Findings: 10 Fr drain placed in right lateral abdominal wall fluid collection. Aspiration yielded 15 mL of blood-tinged, minimally turbid fluid. Sent for culture analysis. Collection nearly decompressed after 15 mL aspirated. Will flush drain every 8 hours.  Marcey DASEN. Luverne, M.D Pager:  [phone]

## 2023-09-29 NOTE — Progress Notes (Signed)
   09/29/23 0843  TOC Brief Assessment  Insurance and Status Reviewed  Patient has primary care physician Yes  Home environment has been reviewed Resides in apartment with spouse and child(ren)  Prior level of function: Independent with ADLs at baseline  Prior/Current Home Services No current home services  Social Drivers of Health Review SDOH reviewed no interventions necessary  Readmission risk has been reviewed Yes  Transition of care needs no transition of care needs at this time

## 2023-09-30 DIAGNOSIS — T8141XD Infection following a procedure, superficial incisional surgical site, subsequent encounter: Secondary | ICD-10-CM | POA: Diagnosis not present

## 2023-09-30 DIAGNOSIS — T8149XA Infection following a procedure, other surgical site, initial encounter: Secondary | ICD-10-CM | POA: Diagnosis not present

## 2023-09-30 LAB — CBC
HCT: 33.7 % — ABNORMAL LOW (ref 36.0–46.0)
Hemoglobin: 10.4 g/dL — ABNORMAL LOW (ref 12.0–15.0)
MCH: 29.3 pg (ref 26.0–34.0)
MCHC: 30.9 g/dL (ref 30.0–36.0)
MCV: 94.9 fL (ref 80.0–100.0)
Platelets: 254 K/uL (ref 150–400)
RBC: 3.55 MIL/uL — ABNORMAL LOW (ref 3.87–5.11)
RDW: 13.6 % (ref 11.5–15.5)
WBC: 8 K/uL (ref 4.0–10.5)
nRBC: 0 % (ref 0.0–0.2)

## 2023-09-30 LAB — BASIC METABOLIC PANEL WITH GFR
Anion gap: 6 (ref 5–15)
BUN: 11 mg/dL (ref 6–20)
CO2: 26 mmol/L (ref 22–32)
Calcium: 8.2 mg/dL — ABNORMAL LOW (ref 8.9–10.3)
Chloride: 105 mmol/L (ref 98–111)
Creatinine, Ser: 0.6 mg/dL (ref 0.44–1.00)
GFR, Estimated: >60 mL/min (ref >60)
Glucose, Bld: 81 mg/dL (ref 70–99)
Potassium: 4.1 mmol/L (ref 3.5–5.1)
Sodium: 137 mmol/L (ref 135–145)

## 2023-09-30 MED ORDER — ENOXAPARIN SODIUM 40 MG/0.4ML IJ SOSY
40.0000 mg | PREFILLED_SYRINGE | Freq: Every day | INTRAMUSCULAR | Status: DC
  Administered 2023-09-30 – 2023-10-01 (×2): 40 mg via SUBCUTANEOUS
  Filled 2023-09-30 (×2): qty 0.4

## 2023-09-30 NOTE — Progress Notes (Signed)
 Follow up drain placement yesterday. About 20mL output recorded last night. Cx pending. IR cont to follow  Franky Rusk PA-C Interventional Radiology 09/30/2023 10:31 AM

## 2023-09-30 NOTE — Progress Notes (Signed)
 [facility]  PROGRESS NOTE  Michelle Lamb FMW:[id number] DOB: 19-Jun-1990 DOA: 09/28/2023 PCP: Nicholaus Credit, PA-C   Brief HPI:   33 y.o. female with hx of recent panniculectomy by plastic surgery on 6/27 who was referred to ED due to finding of abscess and cellulitis along the right flank and new onset of fever.  Patient reports she was doing well until Monday 7/28 she was rolling over in bed and felt a sudden pain on her right flank.  She was seen by her primary care physician and on 7/30 she had a CT which demonstrated a right flank abscess send cellulitis.  She was prescribed Bactrim  which she took but then developed hives and therefore stopped.  Was Rx'd ciprofloxacin  on 7/31.  Had contacted plastic surgery and was planned to have ultrasound guided drainage of the fluid collection, however due to fever yesterday and feeling generally unwell she contacted plastic surgery again who referred her to the emergency department.       Assessment/Plan:   R flank cellulitis with abscess, post op s/p Panniculectomy 6/27  -S/p drainage of the abscess with drain placement per IR -CT scan obtained by PCP as outpatient showed right abdominal subcutaneous fluid collection -Started on vancomycin  per pharmacy -IR was consulted for drain placement, underwent drain placement on 09/29/2023 -Symptomatic management  Tylenol  as needed mild, oxycodone  2.5/5 mg for moderate/severe, Morphine  2 mg IV every 4 hours as needed for breakthrough -Abscess culture showed rare gram-positive cocci in pairs, final culture is pending     Mood disorder:  Continue home venlafaxine , Ativan  prn   ADHD:  Says she does not need Vyvanse  inpatient    Medications     pantoprazole   40 mg Oral Daily   sodium chloride  flush  3 mL Intravenous Q12H   venlafaxine  XR  150 mg Oral Q breakfast     Data Reviewed:   CBG:  No results for input(s): GLUCAP in the last 168 hours.  SpO2: 100 % O2 Flow Rate (L/min): 2  L/min    Vitals:   09/29/23 1350 09/29/23 1900 09/29/23 2109 09/30/23 0446  BP: 105/67 120/80 (!) 106/58 105/65  Pulse: 63 67 71 (!) 58  Resp: 17 16 15 20   Temp:  98.7 F (37.1 C) 97.8 F (36.6 C) 98.2 F (36.8 C)  TempSrc:  Oral Oral Oral  SpO2: 99% 100% 98% 100%  Weight:      Height:          Data Reviewed:  Basic Metabolic Panel: Recent Labs  Lab 09/27/23 0000 09/28/23 2113 09/29/23 0532 09/30/23 0449  NA 135* 135 135 137  K 3.8 3.7 3.8 4.1  CL 101 102 106 105  CO2 26* 25 22 26   GLUCOSE  --  93 84 81  BUN 12 16 14 11   CREATININE 0.5 0.66 0.58 0.60  CALCIUM 9.0 9.1 8.2* 8.2*  MG  --   --  1.8  --   PHOS  --   --  3.1  --     CBC: Recent Labs  Lab 09/27/23 0000 09/28/23 2113 09/29/23 0532 09/30/23 0449  WBC 13.7 13.3* 11.1* 8.0  NEUTROABS 76.50 9.5*  --   --   HGB 12.7 12.4 11.1* 10.4*  HCT 38 39.3 35.3* 33.7*  MCV  --  91.6 92.9 94.9  PLT 250 294 216 254    LFT Recent Labs  Lab 09/27/23 0000 09/28/23 2113  AST 23 21  ALT 23 20  ALKPHOS 89 80  BILITOT  --  0.9  PROT  --  7.3  ALBUMIN 4.1 3.8     Antibiotics: Anti-infectives (From admission, onward)    Start     Dose/Rate Route Frequency Ordered Stop   09/29/23 1200  vancomycin  (VANCOREADY) IVPB 750 mg/150 mL        750 mg 150 mL/hr over 60 Minutes Intravenous Every 12 hours 09/29/23 0450     09/29/23 0030  vancomycin  (VANCOCIN ) IVPB 1000 mg/200 mL premix        1,000 mg 200 mL/hr over 60 Minutes Intravenous  Once 09/29/23 0019 09/29/23 0210        DVT prophylaxis: Lovenox   Code Status: Full code  Family Communication: No family at bedside   CONSULTS IR, plastic surgery   Subjective   Status post drain in the right flank abscess.  Feels better this morning.   Objective    Physical Examination:   General-appears in no acute distress Heart-S1-S2, regular, no murmur auscultated Lungs-clear to auscultation bilaterally, no wheezing or crackles  auscultated Abdomen-soft, mild tenderness in right lower quadrant, positive induration Extremities-no edema in the lower extremities Neuro-alert, oriented x3, no focal deficit noted  Status is: Inpatient:             Sabas GORMAN Brod   [facility] If 7PM-7AM, please contact night-coverage at [email or web address], Office  [phone]   09/30/2023, 8:18 AM  LOS: 1 day

## 2023-10-01 ENCOUNTER — Other Ambulatory Visit: Payer: Self-pay | Admitting: Radiology

## 2023-10-01 DIAGNOSIS — T8141XD Infection following a procedure, superficial incisional surgical site, subsequent encounter: Secondary | ICD-10-CM | POA: Diagnosis not present

## 2023-10-01 DIAGNOSIS — T8149XA Infection following a procedure, other surgical site, initial encounter: Secondary | ICD-10-CM

## 2023-10-01 MED ORDER — CEFADROXIL 500 MG PO CAPS: 1000.0000 mg | ORAL_CAPSULE | Freq: Two times a day (BID) | ORAL | 0 refills | Status: DC

## 2023-10-01 MED ORDER — SODIUM CHLORIDE 0.9% FLUSH
10.0000 mL | Freq: Every day | INTRAVENOUS | Status: DC
  Administered 2023-10-01: 10 mL

## 2023-10-01 MED ORDER — CEFADROXIL 500 MG PO CAPS
1000.0000 mg | ORAL_CAPSULE | Freq: Two times a day (BID) | ORAL | Status: DC
  Administered 2023-10-01: 1000 mg via ORAL
  Filled 2023-10-01: qty 2

## 2023-10-01 MED ORDER — SODIUM CHLORIDE 0.9% FLUSH: 10.0000 mL | Freq: Every day | INTRAVENOUS | 0 refills | Status: DC

## 2023-10-01 MED ORDER — ACETAMINOPHEN 500 MG PO TABS: 1000.0000 mg | ORAL_TABLET | Freq: Four times a day (QID) | ORAL | Status: DC | PRN

## 2023-10-01 MED ORDER — OXYCODONE HCL 5 MG PO TABS: 5.0000 mg | ORAL_TABLET | Freq: Four times a day (QID) | ORAL | 0 refills | Status: DC | PRN

## 2023-10-01 NOTE — Discharge Summary (Addendum)
 Physician Discharge Summary   Patient: Michelle Lamb MRN: [id number] DOB: 04-03-1990  Admit date:     09/28/2023  Discharge date: 10/01/23  Discharge Physician: Sabas GORMAN Brod   PCP: Nicholaus Credit, PA-C   Recommendations at discharge:   Follow-up plastic surgery as outpatient Follow-up IR as outpatient Continue cefadroxil  1 g p.o. twice daily for 5 days Continue daily flushes with normal saline as directed  Discharge Diagnoses: Principal Problem:   Post op infection  Resolved Problems:   * No resolved hospital problems. *  Hospital Course: 33 y.o. female with hx of recent panniculectomy by plastic surgery on 6/27 who was referred to ED due to finding of abscess and cellulitis along the right flank and new onset of fever.  Patient reports she was doing well until Monday 7/28 she was rolling over in bed and felt a sudden pain on her right flank.  She was seen by her primary care physician and on 7/30 she had a CT which demonstrated a right flank abscess send cellulitis.  She was prescribed Bactrim  which she took but then developed hives and therefore stopped.  Was Rx'd ciprofloxacin  on 7/31.  Had contacted plastic surgery and was planned to have ultrasound guided drainage of the fluid collection, however due to fever yesterday and feeling generally unwell she contacted plastic surgery again who referred her to the emergency department.   Assessment and Plan:    R flank cellulitis with abscess, post op s/p Panniculectomy 6/27  -S/p drainage of the abscess with drain placement per IR -CT scan obtained by PCP as outpatient showed right abdominal subcutaneous fluid collection -Started on vancomycin  per pharmacy -IR was consulted for drain placement, underwent drain placement on 09/29/2023 -WBC down to 8000 -Abscess culture grew MSSA switch to cefadroxil  -Will discharge on cefadroxil  for 5 more days -Patient will go home with drain, follow-up with IR in 1 week -Continue drain care as  directed - Blood culture x 2 remain negative to date      Mood disorder:  Continue home venlafaxine    ADHD:  Continue home medications           Consultants: IR, plastic surgery Procedures performed: Drain placement and right subcutaneous abscess Disposition: Home Diet recommendation:  Discharge Diet Orders (From admission, onward)     Start     Ordered   10/01/23 0000  Diet - low sodium heart healthy        10/01/23 1337           Regular diet DISCHARGE MEDICATION: Allergies as of 10/01/2023       Reactions   Bactrim  [sulfamethoxazole -trimethoprim ] Hives   Dilaudid  [hydromorphone ] Rash   Sulfa  Antibiotics Rash        Medication List     STOP taking these medications    ciprofloxacin  500 MG tablet Commonly known as: Cipro    HYDROcodone -acetaminophen  5-325 MG tablet Commonly known as: NORCO/VICODIN       TAKE these medications    acetaminophen  500 MG tablet Commonly known as: TYLENOL  Take 2 tablets (1,000 mg total) by mouth every 6 (six) hours as needed for mild pain (pain score 1-3).   albuterol  108 (90 Base) MCG/ACT inhaler Commonly known as: VENTOLIN  HFA Inhale 2 puffs into the lungs every 6 (six) hours as needed for wheezing or shortness of breath.   cefadroxil  500 MG capsule Commonly known as: DURICEF Take 2 capsules (1,000 mg total) by mouth 2 (two) times daily for 5 days.   cyanocobalamin  1000  MCG/ML injection Commonly known as: VITAMIN B12 Inject 1 ML as directed once a month   fluticasone  50 MCG/ACT nasal spray Commonly known as: FLONASE  Place 2 sprays into both nostrils daily. What changed:  when to take this reasons to take this   FreeStyle Libre 3 Sensor Misc Use every 14 days - apply as directed   Linzess  290 MCG Caps capsule Generic drug: linaclotide  Take one capsule on an empty stomach 30 minutes prior to first meal of the day. Keep medicine in its original container.   lisdexamfetamine 70 MG capsule Commonly known  as: Vyvanse  Take 1 capsule (70 mg total) by mouth daily.   LORazepam  1 MG tablet Commonly known as: ATIVAN  Take 1 tablet (1 mg total) by mouth daily as needed.   meclizine  25 MG tablet Commonly known as: ANTIVERT  Take 1 tablet (25 mg total) by mouth 3 (three) times daily as needed for dizziness.   multivitamin with minerals Tabs tablet Take 1 tablet by mouth daily.   ondansetron  4 MG disintegrating tablet Commonly known as: ZOFRAN -ODT Take 1 tablet (4 mg total) by mouth every 8 (eight) hours as needed for nausea or vomiting.   oxyCODONE  5 MG immediate release tablet Commonly known as: Oxy IR/ROXICODONE  Take 1 tablet (5 mg total) by mouth every 6 (six) hours as needed for severe pain (pain score 7-10).   pantoprazole  40 MG tablet Commonly known as: PROTONIX  Take 1 tablet (40 mg total) by mouth daily.   sodium chloride  flush 0.9 % Soln Commonly known as: NS 10 mLs by Intracatheter route daily for 15 days.   Sodium Fluoride  5000 PPM 1.1 % Pste Generic drug: Sodium Fluoride  Apply 1 application to the teeth twice a day; . Floss first, then brush 2 times daily with PreviDent toothpaste. Expectorate. Do not rinse, use mouth rinse, eat, or drink for 30 min after use.   venlafaxine  XR 150 MG 24 hr capsule Commonly known as: Effexor  XR Take 1 capsule (150 mg total) by mouth daily with breakfast.   Vitamin D  (Ergocalciferol ) 1.25 MG (50000 UNIT) Caps capsule Commonly known as: DRISDOL  Twice weekly What changed:  how much to take how to take this when to take this        Follow-up Information     [facility]. Go in 1 week(s).   Specialty: Radiology Why: Clinic will call to schedule follow up Contact information: [address] [place]  [address] [phone]        Waddell Leonce NOVAK, MD. Schedule an appointment as soon as possible for a visit.   Specialty: Plastic Surgery Contact information: [address] [place]  [address] [phone]         Nicholaus Credit, PA-C Follow up in 1 week(s).   Specialty: Physician Assistant Contact information: [address] [place] [address] [phone]                Discharge Exam: Michelle Lamb   09/28/23 2101 09/29/23 0239  Weight: 84.8 kg 88.2 kg   General-appears in no acute distress Heart-S1-S2, regular, no murmur auscultated Lungs-clear to auscultation bilaterally, no wheezing or crackles auscultated Abdomen-soft, nontender, no organomegaly Extremities-no edema in the lower extremities Neuro-alert, oriented x3, no focal deficit noted  Condition at discharge: good  The results of significant diagnostics from this hospitalization (including imaging, microbiology, ancillary and laboratory) are listed below for reference.   Imaging Studies: CT GUIDED PERITONEAL/RETROPERITONEAL FLUID DRAIN BY [facility] CATH Result  Date: 09/29/2023 CLINICAL DATA:  Right abdominal wall fluid collection after prior panniculectomy on 08/25/2023. EXAM: CT GUIDED CATHETER DRAINAGE OF RIGHT ABDOMINAL ABSCESS ANESTHESIA/SEDATION: Moderate (conscious) sedation was employed during this procedure. A total of Versed  2.0 mg and Fentanyl  100 mcg was administered intravenously. Moderate Sedation Time: 19 minutes. The patient's level of consciousness and vital signs were monitored continuously by radiology nursing throughout the procedure under my direct supervision. PROCEDURE: The procedure, risks, benefits, and alternatives were explained to the patient. Questions regarding the procedure were encouraged and answered. The patient understands and consents to the procedure. A time out was performed prior to initiating the procedure. RADIATION DOSE REDUCTION: This exam was performed according to the departmental dose-optimization program which includes automated exposure control, adjustment of the mA and/or kV according to patient size and/or use of iterative reconstruction technique. The  abdominal wall was prepped with chlorhexidine  in a sterile fashion, and a sterile drape was applied covering the operative field. A sterile gown and sterile gloves were used for the procedure. Local anesthesia was provided with 1% Lidocaine . CT was performed in a supine position. A site was chosen over the right abdominal wall for the procedure. Under CT guidance, an 18 gauge trocar needle was advanced into a right lateral superficial abdominal wall fluid collection. After aspiration of fluid, a guidewire was advanced into the collection, the tract dilated over the wire and a 10 French drainage catheter placed. Aspiration was performed through the drain and a fluid sample sent for culture analysis. The drainage catheter was attached to suction bulb drainage. The drain exit site was secured with a Prolene retention suture and StatLock device. COMPLICATIONS: None FINDINGS: Irregular fluid collection in the subcutaneous fat of the lateral right lower abdominal wall present. After drain placement there is return of blood tinged, slightly turbid fluid. A sample of 15 mL of fluid was sent for culture analysis. IMPRESSION: CT-guided percutaneous catheter drainage of right lateral abdominal wall fluid collection yielding blood tinged and slightly turbid fluid. A sample was sent for culture analysis. A 10 French drain was placed and attached to suction bulb drainage. Electronically Signed   By: Marcey Moan M.D.   On: 09/29/2023 15:07   CT ABDOMEN PELVIS W CONTRAST Result Date: 09/27/2023 Pt was notified of results - findings consistent with exam - abscess with cellulitis Pt already treated with Rocephin  1 gram IM in office and will send in rx for Bactrim  DS bid Pt's surgeon office contacted with findings and spoke with PA - he will have her set up for ultrasound and drain placement --- their office to contact pt regarding that appt   DG Abd 2 Views Result Date: 09/26/2023 CLINICAL DATA:  Abdominal pain EXAM:  ABDOMEN - 2 VIEW COMPARISON:  Abdominal x-ray 07/15/2021. FINDINGS: The bowel gas pattern is normal. There is moderate stool burden. Cholecystectomy clips are present. There is no evidence of free air. No radio-opaque calculi or other significant radiographic abnormality is seen. IMPRESSION: Negative. Electronically Signed   By: Greig Pique M.D.   On: 09/26/2023 19:53    Microbiology: Results for orders placed or performed during the hospital encounter of 09/28/23  Culture, blood (Routine X 2) w Reflex to ID Panel     Status: None (Preliminary result)   Collection Time: 09/29/23  4:06 AM   Specimen: BLOOD  Result Value Ref Range Status   Specimen Description   Final    BLOOD LEFT ANTECUBITAL Performed at [facility], [address]  [place], [place] [address]    Special Requests   Final    BOTTLES DRAWN AEROBIC AND ANAEROBIC Blood Culture adequate volume Performed at [facility], [address], [place], [place] [address]    Culture   Final    NO GROWTH 1 DAY Performed at [facility], [address] [place], [place] [address]    Report Status PENDING  Incomplete  Culture, blood (Routine X 2) w Reflex to ID Panel     Status: None (Preliminary result)   Collection Time: 09/29/23  4:07 AM   Specimen: BLOOD LEFT ARM  Result Value Ref Range Status   Specimen Description BLOOD LEFT ARM  Final   Special Requests   Final    BOTTLES DRAWN AEROBIC ONLY Blood Culture results may not be optimal due to an inadequate volume of blood received in culture bottles   Culture   Final    NO GROWTH 1 DAY Performed at [facility], [address], [place], [place] [address]    Report Status PENDING  Incomplete  Aerobic/Anaerobic Culture w Gram Stain (surgical/deep wound)     Status: None (Preliminary result)   Collection Time: 09/29/23  1:51 PM   Specimen: Abscess  Result Value Ref Range Status   Specimen Description   Final     ABSCESS Performed at [facility], [address], [place], [place] [address]    Special Requests   Final    Normal Performed at [facility], [address], [place], [place] [address]    Gram Stain   Final    ABUNDANT WBC PRESENT, PREDOMINANTLY PMN RARE GRAM POSITIVE COCCI IN PAIRS Performed at [facility], [address], [place], [place] [address]    Culture FEW STAPHYLOCOCCUS AUREUS  Final   Report Status PENDING  Incomplete   Organism ID, Bacteria STAPHYLOCOCCUS AUREUS  Final      Susceptibility   Staphylococcus aureus - MIC*    CIPROFLOXACIN  <=0.5 SENSITIVE Sensitive     ERYTHROMYCIN <=0.25 SENSITIVE Sensitive     GENTAMICIN <=0.5 SENSITIVE Sensitive     OXACILLIN <=0.25 SENSITIVE Sensitive     TETRACYCLINE <=1 SENSITIVE Sensitive     VANCOMYCIN  1 SENSITIVE Sensitive     TRIMETH /SULFA  <=10 SENSITIVE Sensitive     CLINDAMYCIN  <=0.25 SENSITIVE Sensitive     RIFAMPIN <=0.5 SENSITIVE Sensitive     Inducible Clindamycin  NEGATIVE Sensitive     LINEZOLID 2 SENSITIVE Sensitive     * FEW STAPHYLOCOCCUS AUREUS    Labs: CBC: Recent Labs  Lab 09/27/23 0000 09/28/23 2113 09/29/23 0532 09/30/23 0449  WBC 13.7 13.3* 11.1* 8.0  NEUTROABS 76.50 9.5*  --   --   HGB 12.7 12.4 11.1* 10.4*  HCT 38 39.3 35.3* 33.7*  MCV  --  91.6 92.9 94.9  PLT 250 294 216 254   Basic Metabolic Panel: Recent Labs  Lab 09/27/23 0000 09/28/23 2113 09/29/23 0532 09/30/23 0449  NA 135* 135 135 137  K 3.8 3.7 3.8 4.1  CL 101 102 106 105  CO2 26* 25 22 26   GLUCOSE  --  93 84 81  BUN 12 16 14 11   CREATININE 0.5 0.66 0.58 0.60  CALCIUM 9.0 9.1 8.2* 8.2*  MG  --   --  1.8  --   PHOS  --   --  3.1  --    Liver Function Tests: Recent Labs  Lab 09/27/23 0000 09/28/23 2113  AST 23 21  ALT 23 20  ALKPHOS 89 80  BILITOT  --  0.9  PROT  --  7.3  ALBUMIN 4.1 3.8   CBG: No results for input(s): GLUCAP in the last 168 hours.  Discharge time  spent: greater than 30 minutes.  Signed: Sabas GORMAN Brod, MD [facility] 10/01/2023

## 2023-10-01 NOTE — Discharge Instructions (Signed)
Flush drain once daily with 5 mL saline ?

## 2023-10-01 NOTE — Progress Notes (Signed)
 Ms. Phegley is postprocedure day 2 from placement of a drain in a small fluid collection of the right lower quadrant after panniculectomy.  She is doing well and remains afebrile.  White count yesterday was within normal range.  She states that she is ambulating and tolerating diet.  Cultures have returned with a pansensitive Staph aureus.  On physical exam she is doing well.  Abdomen is soft nontender except for the area immediately adjacent to the drain.  There is no erythema.  Status post panniculectomy with postoperative cellulitis. I appreciate the assistance of the hospitalist team and specially Dr. Drusilla.  Patient is scheduled to be discharged home today.  She is instructed to call the office tomorrow morning for a follow-up next week.

## 2023-10-02 ENCOUNTER — Other Ambulatory Visit: Payer: Self-pay | Admitting: Plastic Surgery

## 2023-10-02 DIAGNOSIS — T8149XA Infection following a procedure, other surgical site, initial encounter: Secondary | ICD-10-CM

## 2023-10-03 ENCOUNTER — Telehealth: Payer: Self-pay | Admitting: Plastic Surgery

## 2023-10-03 NOTE — Telephone Encounter (Signed)
 INS said that the [place]  Guided needle placement needs to be sent to [employer]. They don't do a precert for this code.

## 2023-10-03 NOTE — Telephone Encounter (Signed)
 [employer] NO PA required

## 2023-10-04 ENCOUNTER — Ambulatory Visit (INDEPENDENT_AMBULATORY_CARE_PROVIDER_SITE_OTHER): Admitting: Plastic Surgery

## 2023-10-04 VITALS — BP 135/81 | HR 63

## 2023-10-04 DIAGNOSIS — Z9889 Other specified postprocedural states: Secondary | ICD-10-CM

## 2023-10-04 DIAGNOSIS — T888XXA Other specified complications of surgical and medical care, not elsewhere classified, initial encounter: Secondary | ICD-10-CM

## 2023-10-04 LAB — AEROBIC/ANAEROBIC CULTURE W GRAM STAIN (SURGICAL/DEEP WOUND): Special Requests: NORMAL

## 2023-10-04 LAB — CULTURE, BLOOD (ROUTINE X 2)
Culture: NO GROWTH
Culture: NO GROWTH
Special Requests: ADEQUATE

## 2023-10-04 NOTE — Progress Notes (Signed)
 Michelle Lamb returns today for follow-up.  She underwent a panniculectomy on June 27.  Approximately 4 weeks after her panniculectomy she developed fevers and abdominal pain.  She had some erythema of the abdominal wall and was treated for cellulitis.  She also had a CT scan which showed a very small fluid collection which was drained.  She still has the drain in place.  Today she is doing well.  She has no specific complaints.  She has not had any fevers and her pain is controlled.  She is tolerating a diet and having bowel movements.  Drain output remains minimal.  On physical exam her abdomen is soft and nontender.  The incisions are all well-healed.  There is no erythema.  The drain is in place.  Will leave the drain in place until she is finished her antibiotics which should be around Friday.  Will remove the drain in the office on Friday.

## 2023-10-06 ENCOUNTER — Encounter: Payer: Self-pay | Admitting: Student

## 2023-10-06 ENCOUNTER — Ambulatory Visit: Admitting: Physician Assistant

## 2023-10-06 ENCOUNTER — Encounter: Payer: Self-pay | Admitting: Physician Assistant

## 2023-10-06 ENCOUNTER — Ambulatory Visit: Admitting: Student

## 2023-10-06 ENCOUNTER — Other Ambulatory Visit: Payer: Self-pay | Admitting: Physician Assistant

## 2023-10-06 VITALS — BP 137/80 | HR 79

## 2023-10-06 VITALS — BP 138/78 | HR 81 | Temp 97.9°F | Ht 63.0 in | Wt 190.6 lb

## 2023-10-06 DIAGNOSIS — Z9889 Other specified postprocedural states: Secondary | ICD-10-CM

## 2023-10-06 DIAGNOSIS — E559 Vitamin D deficiency, unspecified: Secondary | ICD-10-CM

## 2023-10-06 DIAGNOSIS — Z905 Acquired absence of kidney: Secondary | ICD-10-CM

## 2023-10-06 DIAGNOSIS — D513 Other dietary vitamin B12 deficiency anemia: Secondary | ICD-10-CM | POA: Diagnosis not present

## 2023-10-06 DIAGNOSIS — N1330 Unspecified hydronephrosis: Secondary | ICD-10-CM

## 2023-10-06 DIAGNOSIS — Z0000 Encounter for general adult medical examination without abnormal findings: Secondary | ICD-10-CM

## 2023-10-06 LAB — POCT URINALYSIS DIP (CLINITEK)
Bilirubin, UA: NEGATIVE
Blood, UA: NEGATIVE
Glucose, UA: NEGATIVE mg/dL
Ketones, POC UA: NEGATIVE mg/dL
Leukocytes, UA: NEGATIVE
Nitrite, UA: NEGATIVE
POC PROTEIN,UA: NEGATIVE
Spec Grav, UA: 1.015 (ref 1.010–1.025)
Urobilinogen, UA: 0.2 U/dL
pH, UA: 6 (ref 5.0–8.0)

## 2023-10-06 NOTE — Progress Notes (Signed)
 Subjective:  Patient ID: Michelle Lamb, female    DOB: 05/28/1990  Age: 33 y.o. MRN: [id number]  Chief Complaint  Patient presents with   Medical Management of Chronic Issues    HPI Well Adult Physical: Patient here for a comprehensive physical exam.The patient reports she is healing from recent drain placement for abdominal abscess - did see specialist this morning and had it removed - is to keep area compressed --- also pt overdue to follow up on renal function test - will review records and order accordingly Do you take any herbs or supplements that were not prescribed by a doctor? no Are you taking calcium supplements? no Are you taking aspirin daily? no  Encounter for general adult medical examination without abnormal findings  Physical (At Risk items are starred): Patient's last physical exam was 1 year ago .  Patient is not afflicted from Stress Incontinence and Urge Incontinence  Patient wears a seat belts Patient has smoke detectors and has carbon monoxide detectors. Patient practices appropriate gun safety. Patient wears sunscreen with extended sun exposure. Dental Care: brushes and flosses daily. Last dental visit: 2 months ago Vision impairments: none Ophthalmology/Optometry: has appt in December Hearing loss: none  Patient's last menstrual period was 12/04/2022 (exact date). - has had partial hysterectomy  Safe at home: Yes Self breast exams: Yes Last pap: partial hysterectomy      10/06/2023   11:11 AM 09/27/2023    9:35 AM 05/29/2023    1:23 PM 10/19/2022    4:23 PM 08/22/2022    3:31 PM  Depression screen PHQ 2/9  Decreased Interest 0 3 2 1 1   Down, Depressed, Hopeless 0 2 2 1 2   PHQ - 2 Score 0 5 4 2 3   Altered sleeping  3 2 2 3   Tired, decreased energy  2 2 1 1   Change in appetite  0 0 1   Feeling bad or failure about yourself   1 1  0  Trouble concentrating  2 3 0 3  Moving slowly or fidgety/restless  0 0 0 0  Suicidal thoughts  0 0 0 0   PHQ-9 Score  13 12 6 10   Difficult doing work/chores  Very difficult Very difficult Somewhat difficult          11/11/2021    9:15 AM 11/14/2022   10:55 AM 12/14/2022   12:06 PM 05/29/2023    1:23 PM 10/06/2023   11:11 AM  Fall Risk  Falls in the past year?  0 0 0 0  Was there an injury with Fall?  0 0 0 0  Fall Risk Category Calculator  0 0 0 0  (RETIRED) Patient Fall Risk Level Low fall risk       Patient at Risk for Falls Due to  No Fall Risks No Fall Risks No Fall Risks No Fall Risks  Fall risk Follow up  Falls evaluation completed Falls evaluation completed Falls evaluation completed Falls evaluation completed     Data saved with a previous flowsheet row definition             Social Hx   Social History   Socioeconomic History   Marital status: Married    Spouse name: Not on file   Number of children: 9   Years of education: 13   Highest education level: Associate degree: academic program  Occupational History   Occupation: Scientist, forensic  Tobacco Use   Smoking status: Never   Smokeless tobacco:  Never  Vaping Use   Vaping status: Never Used  Substance and Sexual Activity   Alcohol use: Yes    Comment: occasionally   Drug use: Never   Sexual activity: Not Currently    Partners: Male    Birth control/protection: Surgical    Comment: tubal, hysterectomy  Other Topics Concern   Not on file  Social History Narrative   Not on file   Social Drivers of Health   Financial Resource Strain: Low Risk  (10/06/2023)   Overall Financial Resource Strain (CARDIA)    Difficulty of Paying Living Expenses: Not hard at all  Food Insecurity: No Food Insecurity (10/06/2023)   Hunger Vital Sign    Worried About Running Out of Food in the Last Year: Never true    Ran Out of Food in the Last Year: Never true  Transportation Needs: No Transportation Needs (10/06/2023)   PRAPARE - Administrator, Civil Service (Medical): No    Lack of Transportation  (Non-Medical): No  Physical Activity: Insufficiently Active (10/06/2023)   Exercise Vital Sign    Days of Exercise per Week: 3 days    Minutes of Exercise per Session: 20 min  Stress: Stress Concern Present (10/06/2023)   [employer] of Occupational Health - Occupational Stress Questionnaire    Feeling of Stress: To some extent  Social Connections: Socially Integrated (10/06/2023)   Social Connection and Isolation Panel    Frequency of Communication with Friends and Family: More than three times a week    Frequency of Social Gatherings with Friends and Family: More than three times a week    Attends Religious Services: 1 to 4 times per year    Active Member of Clubs or Organizations: Yes    Attends Banker Meetings: 1 to 4 times per year    Marital Status: Married   Past Medical History:  Diagnosis Date   Abnormal menses 08/22/2022   Absolute anemia 10/12/2021   Acute cystitis with hematuria 12/22/2020   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   Blood transfusion without reported diagnosis 2018   postpartum   Chest pain of uncertain etiology 06/08/2022   06/15/22 Myocardial perfusion - normal, 09/15/22 long term heart monitor in Epic   Chronic kidney disease    right kidney at 35%   Complication of anesthesia    Slow to wake up   Constipation 06/28/2022   Diabetes mellitus without complication (HCC)    NOT AN ISSUE SINCE GASTRIC BYPASS IN 2022. 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  Report date is 02/23/2022.      The CancerNext-Expanded gene panel  offered by [facility] and includes sequencing, rearrangement, and RNA analysis for the following 77 genes: AIP, ALK, APC, ATM, AXIN2, BAP1, BARD1, BLM, BMPR1A, BRCA1, BRCA2, BRIP1, CDC73, CDH1, CDK4, CDKN1B, CDKN2A, CHEK2, CTNNA1, DICER1, FANCC, FH, FLCN, GALNT12, KIF1B, LZTR1,   Hepatic steatosis 09/08/2021   History of herpes simplex infection    History of partial nephrectomy 03/21/2022   Formatting of this note might be different from the original. 03/15/2021: Underwent  right partial nephrectomy by Dr. Renda for right renal neoplasm. Pathology: Angiomyolipoma. Formatting of this note might be different from the original. 03/15/2021: Underwent right partial nephrectomy by Dr. Renda for right renal neoplasm. Pathology: Angiomyolipoma.   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   Morbid obesity (HCC) 06/02/2020   Neoplasm of right kidney    benign tumor of kidney   Obesity (BMI 30.0-34.9) 06/08/2022   Obstructive sleep apnea 10/29/2019   hx of before gastric bypass, no longer uses CPAP   Racing heart beat 08/22/2022   see 09/15/22 Long term heart monitor in Epic   Seasonal allergies    Sleep apnea    Ureteral obstruction, right 07/15/2021   Vaginal delivery 2009, 2010, 2015   Vitamin D  insufficiency 09/30/2020   Weight loss 09/30/2020   Past Surgical History:  Procedure Laterality Date   ABDOMINAL HYSTERECTOMY  2024   APPENDECTOMY     CESAREAN SECTION  09/29/2016   CHOLECYSTECTOMY     COLONOSCOPY WITH ESOPHAGOGASTRODUODENOSCOPY (EGD)  07/22/2022   CYSTOSCOPY N/A 12/27/2022   Procedure: CYSTOSCOPY;  Surgeon: Glennon Almarie POUR, MD;   Location: [facility];  Service: Gynecology;  Laterality: N/A;   CYSTOSCOPY W/ URETERAL STENT PLACEMENT Right 07/15/2021   Procedure: CYSTOSCOPY WITH RETROGRADE PYELOGRAM/URETERAL STENT PLACEMENT;  Surgeon: Renda Glance, MD;  Location: [facility];  Service: Urology;  Laterality: Right;   CYSTOSCOPY WITH RETROGRADE PYELOGRAM, URETEROSCOPY AND STENT PLACEMENT Right 05/03/2021   Procedure: CYSTOSCOPY WITH RIGHT RETROGRADE PYELOGRAM, URETEROSCOPY;  Surgeon: Renda Glance, MD;  Location: [facility];  Service: Urology;  Laterality: Right;   DILATION AND CURETTAGE OF UTERUS N/A 12/31/2015   Procedure: SUCTION DILATATION AND CURETTAGE;  Surgeon: Bebe Furry, MD;  Location: [facility];  Service: Gynecology;  Laterality: N/A;   DILATION AND EVACUATION N/A 01/01/2016   Procedure: DILATATION AND EVACUATION;  Surgeon: Norleen LULLA Server, MD;  Location: [facility];  Service: Gynecology;  Laterality: N/A;   ESOPHAGOGASTRODUODENOSCOPY     GASTRIC BYPASS  06/02/2020   IR NEPHRO TUBE REMOV/FL  07/16/2021   IR NEPHROSTOMY EXCHANGE RIGHT  05/12/2021   IR NEPHROSTOMY PLACEMENT RIGHT  05/05/2021   PANNICULECTOMY N/A 08/25/2023   Procedure: PANNICULECTOMY;  Surgeon: Waddell Leonce NOVAK, MD;  Location: [facility];  Service: Plastics;  Laterality: N/A;   ROBOT ASSISTED PYELOPLASTY Right 07/15/2021   Procedure: XI ROBOTIC ASSISTED  LAPAROSCOPIC PYELOPLASTY;  Surgeon: Renda Glance, MD;  Location: [facility];  Service: Urology;  Laterality: Right;   ROBOTIC ASSISTED LAPAROSCOPIC HYSTERECTOMY AND SALPINGECTOMY Bilateral 12/27/2022   Procedure: XI ROBOTIC ASSISTED LAPAROSCOPIC HYSTERECTOMY AND SALPINGECTOMY;  Surgeon: Glennon Almarie POUR, MD;  Location: [facility];  Service: Gynecology;  Laterality: Bilateral;   ROBOTIC ASSITED PARTIAL NEPHRECTOMY Right 03/15/2021   Procedure: XI ROBOTIC ASSITED PARTIAL NEPHRECTOMY;  Surgeon: Renda Glance, MD;  Location: [facility];  Service: Urology;   Laterality: Right;   TONSILLECTOMY     TUBAL LIGATION     UPPER GASTROINTESTINAL ENDOSCOPY     w/dilation    Family History  Problem Relation Age of Onset   Hypertension Mother    Thyroid  disease  Mother    Cervical cancer Mother        dx 86s   Depression Mother    Diabetes Mother    Hyperlipidemia Mother    Anxiety disorder Mother    Asthma Father    Stomach cancer Maternal Grandmother        dx 48s   Leukemia Maternal Grandfather        d. 64s   Depression Paternal Grandmother    Diabetes Paternal Grandmother    Cancer Paternal Grandfather        dx after 3; mets   Breast cancer Maternal Great-grandmother        dx <50; mets; MGF's mother   Stomach cancer Other        MGM's brother; dx after 10   Cancer Other        MGF's sisters x3; unknown primary   ADD / ADHD Daughter    Colon cancer Neg Hx    Colon polyps Neg Hx    Esophageal cancer Neg Hx    Rectal cancer Neg Hx     ROS CONSTITUTIONAL: Negative for chills, fatigue, fever, unintentional weight gain and unintentional weight loss.  E/N/T: Negative for ear pain, nasal congestion and sore throat.  CARDIOVASCULAR: Negative for chest pain, dizziness, palpitations and pedal edema.  RESPIRATORY: Negative for recent cough and dyspnea.  GASTROINTESTINAL: see HPI MSK: Negative for arthralgias and myalgias.  INTEGUMENTARY: Negative for rash.  NEUROLOGICAL: Negative for dizziness and headaches.  PSYCHIATRIC: Negative for sleep disturbance and to question depression screen.  Negative for depression, negative for anhedonia.   Objective:  PHYSICAL EXAM:   BP 138/78   Pulse 81   Temp 97.9 F (36.6 C)   Ht 5' 3 (1.6 m)   Wt 190 lb 9.6 oz (86.5 kg)   LMP 12/04/2022 (Exact Date)   SpO2 98%   BMI 33.76 kg/m   Vision Screening   Right eye Left eye Both eyes  Without correction 20/20 20/20 20/20   With correction       GEN: Well nourished, well developed, in no acute distress  HEENT: normal external ears and nose  - normal external auditory canals and TMS - hearing grossly normal -- Lips, Teeth and Gums - normal  Oropharynx - normal mucosa, palate, and posterior pharynx Neck: no JVD or masses - no thyromegaly Cardiac: RRR; no murmurs, rubs, or gallops,no edema - no significant varicosities Respiratory:  normal respiratory rate and pattern with no distress - normal breath sounds with no rales, rhonchi, wheezes or rubs GI: normal bowel sounds, no masses- mild generalized tenderness from recent surgery/drain - has on compression wrap MS: no deformity or atrophy  Skin: warm and dry, no rash  Neuro:  Alert and Oriented x 3, - CN II-Xii grossly intact Psych: euthymic mood, appropriate affect and demeanor  Office Visit on 10/06/2023  Component Date Value Ref Range Status   Color, UA 10/06/2023 yellow  yellow Final   Clarity, UA 10/06/2023 clear  clear Final   Glucose, UA 10/06/2023 negative  negative mg/dL Final   Bilirubin, UA 91/91/7974 negative  negative Final   Ketones, POC UA 10/06/2023 negative  negative mg/dL Final   Spec Grav, UA 91/91/7974 1.015  1.010 - 1.025 Final   Blood, UA 10/06/2023 negative  negative Final   pH, UA 10/06/2023 6.0  5.0 - 8.0 Final   POC PROTEIN,UA 10/06/2023 negative  negative, trace Final   Urobilinogen, UA 10/06/2023 0.2  0.2 or 1.0 E.U./dL Final  Nitrite, UA 10/06/2023 Negative  Negative Final   Leukocytes, UA 10/06/2023 Negative  Negative Final    Assessment & Plan:  Annual physical exam -     POCT URINALYSIS DIP (CLINITEK) -     CBC with Differential/Platelet; Future -     Comprehensive metabolic panel with GFR; Future -     TSH; Future -     Lipid panel; Future -     Hemoglobin A1c; Future -     B12 and Folate Panel; Future -     VITAMIN D  25 Hydroxy (Vit-D Deficiency, Fractures); Future -     Hepatitis C antibody; Future -     Hepatitis B surface antibody,qualitative; Future  Vitamin D  deficiency -     VITAMIN D  25 Hydroxy (Vit-D Deficiency, Fractures);  Future  Other dietary vitamin B12 deficiency anemia -     B12 and Folate Panel; Future    This is a list of the screening recommended for you and due dates:  Health Maintenance  Topic Date Due   Eye exam for diabetics  10/06/2023*   Flu Shot  05/28/2024*   Pneumococcal Vaccine for high risk medical condition (1 of 2 - PCV) 10/05/2024*   Hepatitis B Vaccine (1 of 3 - 19+ 3-dose series) 10/05/2024*   HPV Vaccine (1 - Risk 3-dose SCDM series) 10/05/2024*   Yearly kidney health urinalysis for diabetes  04/06/2027*   Yearly kidney function blood test for diabetes  09/29/2024   DTaP/Tdap/Td vaccine (2 - Td or Tdap) 08/11/2026   Meningitis B Vaccine  Aged Out   Complete foot exam   Discontinued   Hemoglobin A1C  Discontinued   COVID-19 Vaccine  Discontinued   Hepatitis C Screening  Discontinued   HIV Screening  Discontinued  *Topic was postponed. The date shown is not the original due date.     Follow-up: Return in about 6 months (around 04/07/2024) for chronic fasting follow-up.  An After Visit Summary was printed and given to the patient.  CAMIE JONELLE NICHOLAUS DEVONNA [facility] [phone]

## 2023-10-06 NOTE — Progress Notes (Signed)
 Patient is a 33 year old female who recently underwent panniculectomy with Dr. Waddell on 08/25/2023.  She is 6 weeks postop.  She presents to the clinic today for postoperative follow-up.  Patient went to her primary care provider on 09/27/2023.  She had underwent CT of the abdomen on 09/27/2023.  The findings were consistent with abscess with cellulitis.  Patient had been started on antibiotics.  Patient then on 09/29/2023 underwent drain placement with interventional radiology.  Patient was then seen in our clinic on 10/04/2023.  At this visit, she was doing well.  She had no specific complaints.  Drain output was minimal.  On exam, abdomen was soft and nontender.  Incisions were well-healed.  Today, patient reports she is doing really well.  She states that her drain has put out almost nothing over the past few days.  She denies any fevers or chills.  She denies any other issues or concerns at this time.  Chaperone present on exam.  On exam, patient is sitting upright in no acute distress.  Abdomen is overall soft and nontender.  There is no overlying erythema.  No obvious fluid collections on exam.  There is a little bit of firmness noted to the right lateral inferior abdomen consistent with scar tissue.  There are no overlying skin changes.  No significant tenderness to palpation.  Incision is otherwise well-healed.  There is a drain in place with very minimal serous drainage in the bulb.  Drain was removed without any difficulty.  Patient tolerated well.  I recommended that patient continue with compression at all times.  I discussed with her that she may notice some drainage out from her drain site.  I discussed with her to apply dressing over the drain site daily and she may start putting Vaseline on the drain site daily starting in a few days.  Patient expressed understanding.  I recommended that the patient gently massage the area of firmness.  I discussed with her that this may take some time to  soften up.  She expressed understanding.  We will have the patient start back at work at light duty for 2 weeks, and then she may transition to full duty.  I discussed with the patient to closely monitor her symptoms closely.  I discussed with her that if she has any new fevers, chills, pain, redness or has any concerns she should call us .  She expressed understanding.  Patient to follow back up with us  in 2 weeks.  I instructed her to call in the meantime she has any questions or concerns about anything.

## 2023-10-09 ENCOUNTER — Other Ambulatory Visit (HOSPITAL_BASED_OUTPATIENT_CLINIC_OR_DEPARTMENT_OTHER): Payer: Self-pay

## 2023-10-09 ENCOUNTER — Other Ambulatory Visit: Payer: Self-pay

## 2023-10-09 DIAGNOSIS — R41840 Attention and concentration deficit: Secondary | ICD-10-CM

## 2023-10-09 DIAGNOSIS — D513 Other dietary vitamin B12 deficiency anemia: Secondary | ICD-10-CM

## 2023-10-09 MED ORDER — LISDEXAMFETAMINE DIMESYLATE 70 MG PO CAPS
70.0000 mg | ORAL_CAPSULE | Freq: Every day | ORAL | 0 refills | Status: DC
  Filled 2023-10-09: qty 30, 30d supply, fill #0

## 2023-10-09 MED ORDER — CYANOCOBALAMIN 1000 MCG/ML IJ SOLN
1000.0000 ug | INTRAMUSCULAR | 2 refills | Status: DC
  Filled 2023-10-09: qty 1, 30d supply, fill #0
  Filled 2023-11-10: qty 1, 30d supply, fill #1

## 2023-10-09 MED ORDER — VITAMIN D (ERGOCALCIFEROL) 1.25 MG (50000 UNIT) PO CAPS
50000.0000 [IU] | ORAL_CAPSULE | ORAL | 3 refills | Status: AC
  Filled 2023-10-09: qty 8, 28d supply, fill #0
  Filled 2023-11-10: qty 8, 28d supply, fill #1
  Filled 2023-12-22: qty 8, 28d supply, fill #2
  Filled 2024-03-08: qty 8, 28d supply, fill #3

## 2023-10-11 ENCOUNTER — Other Ambulatory Visit: Payer: Self-pay

## 2023-10-11 ENCOUNTER — Other Ambulatory Visit

## 2023-10-11 ENCOUNTER — Inpatient Hospital Stay: Admission: RE | Admit: 2023-10-11 | Source: Ambulatory Visit

## 2023-10-11 ENCOUNTER — Encounter (HOSPITAL_COMMUNITY): Admission: RE | Admit: 2023-10-11 | Source: Ambulatory Visit

## 2023-10-11 DIAGNOSIS — D513 Other dietary vitamin B12 deficiency anemia: Secondary | ICD-10-CM

## 2023-10-11 DIAGNOSIS — E559 Vitamin D deficiency, unspecified: Secondary | ICD-10-CM | POA: Diagnosis not present

## 2023-10-11 DIAGNOSIS — Z0000 Encounter for general adult medical examination without abnormal findings: Secondary | ICD-10-CM

## 2023-10-12 ENCOUNTER — Ambulatory Visit: Payer: Self-pay | Admitting: Physician Assistant

## 2023-10-12 LAB — CBC WITH DIFFERENTIAL/PLATELET
Basophils Absolute: 0.1 x10E3/uL (ref 0.0–0.2)
Basos: 1 %
EOS (ABSOLUTE): 0.1 x10E3/uL (ref 0.0–0.4)
Eos: 1 %
Hematocrit: 42.4 % (ref 34.0–46.6)
Hemoglobin: 13.4 g/dL (ref 11.1–15.9)
Immature Grans (Abs): 0 x10E3/uL (ref 0.0–0.1)
Immature Granulocytes: 0 %
Lymphocytes Absolute: 2.8 x10E3/uL (ref 0.7–3.1)
Lymphs: 21 %
MCH: 29.5 pg (ref 26.6–33.0)
MCHC: 31.6 g/dL (ref 31.5–35.7)
MCV: 93 fL (ref 79–97)
Monocytes Absolute: 0.7 x10E3/uL (ref 0.1–0.9)
Monocytes: 5 %
Neutrophils Absolute: 9.4 x10E3/uL — ABNORMAL HIGH (ref 1.4–7.0)
Neutrophils: 72 %
Platelets: 398 x10E3/uL (ref 150–450)
RBC: 4.54 x10E6/uL (ref 3.77–5.28)
RDW: 12.9 % (ref 11.7–15.4)
WBC: 13.1 x10E3/uL — ABNORMAL HIGH (ref 3.4–10.8)

## 2023-10-12 LAB — LIPID PANEL
Chol/HDL Ratio: 2.4 ratio (ref 0.0–4.4)
Cholesterol, Total: 236 mg/dL — ABNORMAL HIGH (ref 100–199)
HDL: 99 mg/dL (ref >39)
LDL Chol Calc (NIH): 129 mg/dL — ABNORMAL HIGH (ref 0–99)
Triglycerides: 49 mg/dL (ref 0–149)
VLDL Cholesterol Cal: 8 mg/dL (ref 5–40)

## 2023-10-12 LAB — COMPREHENSIVE METABOLIC PANEL WITH GFR
ALT: 27 IU/L (ref 0–32)
AST: 21 IU/L (ref 0–40)
Albumin: 4.8 g/dL (ref 3.9–4.9)
Alkaline Phosphatase: 114 IU/L (ref 44–121)
BUN/Creatinine Ratio: 22 (ref 9–23)
BUN: 13 mg/dL (ref 6–20)
Bilirubin Total: 0.5 mg/dL (ref 0.0–1.2)
CO2: 23 mmol/L (ref 20–29)
Calcium: 9.7 mg/dL (ref 8.7–10.2)
Chloride: 99 mmol/L (ref 96–106)
Creatinine, Ser: 0.59 mg/dL (ref 0.57–1.00)
Globulin, Total: 2.3 g/dL (ref 1.5–4.5)
Glucose: 74 mg/dL (ref 70–99)
Potassium: 4.2 mmol/L (ref 3.5–5.2)
Sodium: 139 mmol/L (ref 134–144)
Total Protein: 7.1 g/dL (ref 6.0–8.5)
eGFR: 121 mL/min/1.73 (ref >59)

## 2023-10-12 LAB — VITAMIN D 25 HYDROXY (VIT D DEFICIENCY, FRACTURES): Vit D, 25-Hydroxy: 15.4 ng/mL — ABNORMAL LOW (ref 30.0–100.0)

## 2023-10-12 LAB — HEMOGLOBIN A1C
Est. average glucose Bld gHb Est-mCnc: 91 mg/dL
Hgb A1c MFr Bld: 4.8 % (ref 4.8–5.6)

## 2023-10-12 LAB — B12 AND FOLATE PANEL
Folate: 7 ng/mL (ref >3.0)
Vitamin B-12: 456 pg/mL (ref 232–1245)

## 2023-10-12 LAB — HEPATITIS C ANTIBODY: Hep C Virus Ab: NONREACTIVE

## 2023-10-12 LAB — TSH: TSH: 0.88 u[IU]/mL (ref 0.450–4.500)

## 2023-10-12 LAB — HEPATITIS B SURFACE ANTIBODY,QUALITATIVE

## 2023-10-20 ENCOUNTER — Encounter (HOSPITAL_COMMUNITY): Payer: Self-pay

## 2023-10-20 ENCOUNTER — Encounter: Payer: Self-pay | Admitting: Physician Assistant

## 2023-10-20 ENCOUNTER — Encounter (HOSPITAL_COMMUNITY)

## 2023-10-20 ENCOUNTER — Ambulatory Visit (INDEPENDENT_AMBULATORY_CARE_PROVIDER_SITE_OTHER): Admitting: Physician Assistant

## 2023-10-20 VITALS — BP 150/98 | HR 84

## 2023-10-20 DIAGNOSIS — Z9889 Other specified postprocedural states: Secondary | ICD-10-CM

## 2023-10-20 NOTE — Progress Notes (Signed)
 Patient is a pleasant 33 year old female s/p panniculectomy performed 08/25/2023 complicated by postoperative infection requiring admission 09/28/2023 who presents to clinic for postoperative follow-up.  She was started on antibiotics given exam findings consistent with cellulitis at the end of July.  She then had drain placement by IR 09/29/2023.  She was seen most recently here in clinic on 10/06/2023.  At that time, drain output was minimal.  Drain was removed without complication or difficulty.  Recommended light duty at work for 2 weeks and then transition to full duty.  Today, patient is doing well from a postoperative standpoint.  No ongoing concerns aside from some mild firmness underneath her panniculectomy scar.  She is overall quite pleased with the outcome of her panniculectomy surgery.  She states that she fits better in her pants and other clothing.  On exam, abdomen is soft and nondistended.  No palpable areas of tenderness.  There is some incisional firmness/scarring underneath the panniculectomy site R > L.  However, no palpable fluid collections.  No overlying erythema or other skin changes.  Recommending mechanical massage of the areas of firmness, suspect scarring.  Recommend silicone scar gel twice daily x 3 months.  She is cleared of any and all postoperative restrictions.  Follow-up with me as needed.  Patient understanding and agreeable.  Picture(s) obtained of the patient and placed in the chart were with the patient's or guardian's permission.

## 2023-10-25 ENCOUNTER — Ambulatory Visit (HOSPITAL_COMMUNITY)
Admission: RE | Admit: 2023-10-25 | Discharge: 2023-10-25 | Disposition: A | Discharge status: Home / Self Care | Source: Ambulatory Visit | Attending: Physician Assistant | Admitting: Physician Assistant

## 2023-10-25 DIAGNOSIS — R935 Abnormal findings on diagnostic imaging of other abdominal regions, including retroperitoneum: Secondary | ICD-10-CM | POA: Diagnosis not present

## 2023-10-25 DIAGNOSIS — Z905 Acquired absence of kidney: Secondary | ICD-10-CM | POA: Insufficient documentation

## 2023-10-25 DIAGNOSIS — N1330 Unspecified hydronephrosis: Secondary | ICD-10-CM | POA: Diagnosis not present

## 2023-10-25 MED ORDER — TECHNETIUM TC 99M MERTIATIDE
5.4000 | Freq: Once | INTRAVENOUS | Status: AC
  Administered 2023-10-25: 5.4 via INTRAVENOUS

## 2023-10-25 MED ORDER — FUROSEMIDE 10 MG/ML IJ SOLN
43.2500 mg | Freq: Once | INTRAMUSCULAR | Status: AC
  Administered 2023-10-25: 43.25 mg via INTRAVENOUS

## 2023-10-25 MED ORDER — FUROSEMIDE 10 MG/ML IJ SOLN
INTRAMUSCULAR | Status: AC
  Filled 2023-10-25: qty 8

## 2023-10-27 ENCOUNTER — Other Ambulatory Visit (HOSPITAL_BASED_OUTPATIENT_CLINIC_OR_DEPARTMENT_OTHER): Payer: Self-pay

## 2023-10-27 ENCOUNTER — Other Ambulatory Visit: Payer: Self-pay | Admitting: Physician Assistant

## 2023-10-27 DIAGNOSIS — R1031 Right lower quadrant pain: Secondary | ICD-10-CM

## 2023-10-27 MED ORDER — HYDROCODONE-ACETAMINOPHEN 5-325 MG PO TABS
1.0000 | ORAL_TABLET | Freq: Four times a day (QID) | ORAL | 0 refills | Status: DC | PRN
  Filled 2023-10-27: qty 20, 3d supply, fill #0

## 2023-10-31 ENCOUNTER — Ambulatory Visit: Payer: Self-pay | Admitting: Physician Assistant

## 2023-11-06 DIAGNOSIS — E66811 Obesity, class 1: Secondary | ICD-10-CM | POA: Diagnosis not present

## 2023-11-08 ENCOUNTER — Ambulatory Visit: Admitting: Physician Assistant

## 2023-11-10 ENCOUNTER — Other Ambulatory Visit (HOSPITAL_BASED_OUTPATIENT_CLINIC_OR_DEPARTMENT_OTHER): Payer: Self-pay

## 2023-11-10 ENCOUNTER — Other Ambulatory Visit: Payer: Self-pay | Admitting: Family Medicine

## 2023-11-10 DIAGNOSIS — F418 Other specified anxiety disorders: Secondary | ICD-10-CM

## 2023-11-10 DIAGNOSIS — K5904 Chronic idiopathic constipation: Secondary | ICD-10-CM | POA: Diagnosis not present

## 2023-11-10 MED ORDER — VENLAFAXINE HCL ER 150 MG PO CP24
150.0000 mg | ORAL_CAPSULE | Freq: Every day | ORAL | 0 refills | Status: DC
  Filled 2023-11-10: qty 90, 90d supply, fill #0

## 2023-11-13 ENCOUNTER — Other Ambulatory Visit: Payer: Self-pay | Admitting: Physician Assistant

## 2023-11-13 ENCOUNTER — Other Ambulatory Visit (HOSPITAL_BASED_OUTPATIENT_CLINIC_OR_DEPARTMENT_OTHER): Payer: Self-pay

## 2023-11-13 DIAGNOSIS — R41840 Attention and concentration deficit: Secondary | ICD-10-CM

## 2023-11-13 MED ORDER — LISDEXAMFETAMINE DIMESYLATE 70 MG PO CAPS
70.0000 mg | ORAL_CAPSULE | Freq: Every day | ORAL | 0 refills | Status: DC
  Filled 2023-11-13: qty 30, 30d supply, fill #0

## 2023-11-17 ENCOUNTER — Telehealth: Admitting: Physician Assistant

## 2023-11-17 ENCOUNTER — Other Ambulatory Visit: Payer: Self-pay | Admitting: Physician Assistant

## 2023-11-17 ENCOUNTER — Other Ambulatory Visit (HOSPITAL_BASED_OUTPATIENT_CLINIC_OR_DEPARTMENT_OTHER): Payer: Self-pay

## 2023-11-17 ENCOUNTER — Encounter: Payer: Self-pay | Admitting: Physician Assistant

## 2023-11-17 DIAGNOSIS — G8929 Other chronic pain: Secondary | ICD-10-CM | POA: Diagnosis not present

## 2023-11-17 DIAGNOSIS — R1031 Right lower quadrant pain: Secondary | ICD-10-CM | POA: Insufficient documentation

## 2023-11-17 DIAGNOSIS — M5441 Lumbago with sciatica, right side: Secondary | ICD-10-CM

## 2023-11-17 MED ORDER — METAXALONE 800 MG PO TABS
400.0000 mg | ORAL_TABLET | Freq: Three times a day (TID) | ORAL | 0 refills | Status: DC | PRN
  Filled 2023-11-17: qty 30, 10d supply, fill #0

## 2023-11-17 MED ORDER — HYDROCODONE-ACETAMINOPHEN 5-325 MG PO TABS
1.0000 | ORAL_TABLET | Freq: Four times a day (QID) | ORAL | 0 refills | Status: DC | PRN
  Filled 2023-11-17: qty 20, 5d supply, fill #0

## 2023-11-17 MED ORDER — PREDNISONE 20 MG PO TABS
ORAL_TABLET | ORAL | 0 refills | Status: AC
  Filled 2023-11-17: qty 18, 9d supply, fill #0

## 2023-11-17 MED ORDER — METHOCARBAMOL 500 MG PO TABS
500.0000 mg | ORAL_TABLET | Freq: Three times a day (TID) | ORAL | 0 refills | Status: DC
  Filled 2023-11-17: qty 30, 10d supply, fill #0

## 2023-11-17 NOTE — Progress Notes (Signed)
 Virtual Visit via Telephone Note   This visit type was conducted due to national recommendations for restrictions regarding the COVID-19 Pandemic (e.g. social distancing) in an effort to limit this patient's exposure and mitigate transmission in our community.  Due to her co-morbid illnesses, this patient is at least at moderate risk for complications without adequate follow up.  This format is felt to be most appropriate for this patient at this time.  The patient did not have access to video technology/had technical difficulties with video requiring transitioning to audio format only (telephone).  All issues noted in this document were discussed and addressed.  No physical exam could be performed with this format.  Patient verbally consented to a telehealth visit.   Date:  11/17/2023   ID:  Michelle Lamb, DOB May 06, 1990, MRN [id number]  Patient Location: Home Provider Location: Office  PCP:  Nicholaus Credit, PA-C   Evaluation Performed:  acute visit  Chief Complaint:  low back pain with sciatica  History of Present Illness:    Michelle Lamb is a 33 y.o. female with complaints of chronic low back pain - has been treated several times for acute flares of back pain and has even gone through physical therapy for treatment .  She states that her mid to lower back had been hurting intermittently for the past several days - did not have specific injury or trauma and unsure if she slept wrong but last night pain worsened in her back.  She has tried taking muscle relaxants but with only minimal relief.  This morning her pain worsened and she had shooting pains down her right leg.  Denies extremity weakness or numbness.   The patient does not have symptoms concerning for COVID-19 infection (fever, chills, cough, or new shortness of breath).    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   Blood transfusion without reported diagnosis 2018   postpartum   Chest pain of uncertain etiology 06/08/2022   06/15/22 Myocardial perfusion - normal, 09/15/22 long term heart monitor in Epic   Chronic kidney disease    right kidney at 35%   Complication of anesthesia    Slow to wake up   Constipation 06/28/2022   Diabetes mellitus without complication (HCC)    NOT AN ISSUE SINCE GASTRIC BYPASS IN 2022. 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  Report date is 02/23/2022.      The CancerNext-Expanded gene panel offered by [facility] and includes sequencing, rearrangement, and RNA analysis for the following 77 genes: AIP, ALK, APC, ATM, AXIN2, BAP1, BARD1, BLM, BMPR1A, BRCA1, BRCA2, BRIP1, CDC73, CDH1, CDK4, CDKN1B, CDKN2A, CHEK2, CTNNA1, DICER1, FANCC, FH, FLCN, GALNT12, KIF1B, LZTR1,   Hepatic steatosis 09/08/2021   History of herpes simplex infection    History of partial nephrectomy 03/21/2022   Formatting of this note might be different from the original. 03/15/2021: Underwent right partial nephrectomy by Dr. Renda for right renal neoplasm.  Pathology: Angiomyolipoma. Formatting of this note might be different from the original. 03/15/2021: Underwent right partial nephrectomy by Dr. Renda for right renal neoplasm. Pathology: Angiomyolipoma.   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   Morbid obesity (HCC) 06/02/2020   Neoplasm of right kidney    benign tumor of kidney   Obesity (BMI 30.0-34.9) 06/08/2022   Obstructive sleep apnea 10/29/2019   hx of before gastric bypass, no longer uses CPAP   Racing heart beat 08/22/2022   see 09/15/22 Long term heart monitor in Epic   Seasonal allergies    Sleep apnea    Ureteral obstruction, right 07/15/2021   Vaginal delivery 2009, 2010, 2015   Vitamin D  insufficiency 09/30/2020   Weight loss 09/30/2020   Past Surgical History:  Procedure Laterality Date   ABDOMINAL HYSTERECTOMY  2024   APPENDECTOMY     CESAREAN SECTION  09/29/2016   CHOLECYSTECTOMY     COLONOSCOPY WITH ESOPHAGOGASTRODUODENOSCOPY (EGD)  07/22/2022   CYSTOSCOPY N/A 12/27/2022   Procedure: CYSTOSCOPY;  Surgeon: Glennon Almarie POUR, MD;  Location: [facility];  Service: Gynecology;  Laterality: N/A;   CYSTOSCOPY W/ URETERAL STENT PLACEMENT Right 07/15/2021   Procedure: CYSTOSCOPY WITH RETROGRADE PYELOGRAM/URETERAL STENT PLACEMENT;  Surgeon: Renda Glance, MD;  Location: [facility];  Service: Urology;  Laterality: Right;   CYSTOSCOPY WITH RETROGRADE PYELOGRAM, URETEROSCOPY AND STENT PLACEMENT Right 05/03/2021   Procedure: CYSTOSCOPY WITH RIGHT RETROGRADE PYELOGRAM, URETEROSCOPY;  Surgeon: Renda Glance, MD;  Location: [facility];  Service: Urology;  Laterality: Right;   DILATION AND CURETTAGE OF UTERUS N/A 12/31/2015   Procedure: SUCTION DILATATION AND CURETTAGE;  Surgeon: Bebe Furry, MD;  Location: [facility];  Service: Gynecology;  Laterality: N/A;   DILATION AND EVACUATION N/A 01/01/2016   Procedure: DILATATION AND EVACUATION;  Surgeon: Norleen LULLA Server, MD;  Location: [facility];  Service: Gynecology;  Laterality: N/A;   ESOPHAGOGASTRODUODENOSCOPY      GASTRIC BYPASS  06/02/2020   IR NEPHRO TUBE REMOV/FL  07/16/2021   IR NEPHROSTOMY EXCHANGE RIGHT  05/12/2021   IR NEPHROSTOMY PLACEMENT RIGHT  05/05/2021   PANNICULECTOMY N/A 08/25/2023   Procedure: PANNICULECTOMY;  Surgeon: Waddell Leonce NOVAK, MD;  Location: [facility];  Service: Plastics;  Laterality: N/A;   ROBOT ASSISTED PYELOPLASTY Right 07/15/2021   Procedure: XI ROBOTIC ASSISTED  LAPAROSCOPIC PYELOPLASTY;  Surgeon: Renda Glance, MD;  Location: [facility];  Service: Urology;  Laterality: Right;   ROBOTIC ASSISTED LAPAROSCOPIC HYSTERECTOMY AND SALPINGECTOMY Bilateral 12/27/2022   Procedure: XI ROBOTIC ASSISTED LAPAROSCOPIC HYSTERECTOMY AND SALPINGECTOMY;  Surgeon: Glennon Almarie POUR, MD;  Location: [facility];  Service: Gynecology;  Laterality: Bilateral;   ROBOTIC ASSITED PARTIAL NEPHRECTOMY Right 03/15/2021   Procedure: XI ROBOTIC ASSITED PARTIAL NEPHRECTOMY;  Surgeon: Renda Glance, MD;  Location: [facility];  Service: Urology;  Laterality: Right;   TONSILLECTOMY     TUBAL LIGATION     UPPER GASTROINTESTINAL ENDOSCOPY     w/dilation     Current Meds  Medication Sig   metaxalone  (SKELAXIN ) 800 MG tablet Take 0.5-1 tablets (400-800 mg total) by mouth 3 (three) times daily as needed  for muscle spasm   predniSONE  (DELTASONE ) 20 MG tablet Take 1 tablet (20 mg total) by mouth 3 (three) times daily for 3 days, THEN 1 tablet (20 mg total) 2 (two) times daily for 3 days, THEN 1 tablet (20 mg total) daily for 3 days.     Allergies:   Bactrim  [sulfamethoxazole -trimethoprim ], Dilaudid  [hydromorphone ], and Sulfa  antibiotics   Social History   Tobacco Use   Smoking status: Never   Smokeless tobacco: Never  Vaping Use   Vaping status: Never Used  Substance Use Topics   Alcohol use: Yes    Comment: occasionally   Drug use: Never     Family Hx: The patient's family history includes ADD / ADHD in her daughter; Anxiety disorder in her mother; Asthma in her  father; Breast cancer in her maternal great-grandmother; Cancer in her paternal grandfather and another family member; Cervical cancer in her mother; Depression in her mother and paternal grandmother; Diabetes in her mother and paternal grandmother; Hyperlipidemia in her mother; Hypertension in her mother; Leukemia in her maternal grandfather; Stomach cancer in her maternal grandmother and another family member; Thyroid  disease in her mother. There is no history of Colon cancer, Colon polyps, Esophageal cancer, or Rectal cancer.  ROS:   Please see the history of present illness.    All other systems reviewed and are negative.  Labs/Other Tests and Data Reviewed:    Recent Labs: 09/29/2023: Magnesium 1.8 10/11/2023: ALT 27; BUN 13; Creatinine, Ser 0.59; Hemoglobin 13.4; Platelets 398; Potassium 4.2; Sodium 139; TSH 0.880   Recent Lipid Panel Lab Results  Component Value Date/Time   CHOL 236 (H) 10/11/2023 01:13 PM   TRIG 49 10/11/2023 01:13 PM   HDL 99 10/11/2023 01:13 PM   CHOLHDL 2.4 10/11/2023 01:13 PM   LDLCALC 129 (H) 10/11/2023 01:13 PM    Wt Readings from Last 3 Encounters:  10/06/23 190 lb 9.6 oz (86.5 kg)  09/29/23 194 lb 7.1 oz (88.2 kg)  09/27/23 187 lb (84.8 kg)     Objective:    Vital Signs:  LMP 12/04/2022 (Exact Date)   Gen - NAD  ASSESSMENT & PLAN:    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     Medication Adjustments/Labs and Tests Ordered: Current medicines are reviewed at length with the patient today.  Concerns regarding medicines are outlined above.   Tests Ordered: Orders Placed This Encounter  Procedures   Ambulatory referral to Orthopedic Surgery    Medication Changes: Meds ordered this encounter  Medications   metaxalone  (SKELAXIN ) 800 MG tablet    Sig:  Take 0.5-1 tablets (400-800 mg total) by mouth 3 (three) times daily as needed for muscle spasm    Dispense:  30 tablet    Refill:  0    Supervising Provider:   COX, KIRSTEN [[id number]]   predniSONE  (DELTASONE ) 20 MG tablet    Sig: Take 1 tablet (20 mg total) by mouth 3 (three) times daily for 3 days, THEN 1 tablet (20 mg total) 2 (two) times daily for 3 days, THEN 1 tablet (20 mg total) daily for 3 days.    Dispense:  18 tablet    Refill:  0    Supervising Provider:   COX, KIRSTEN [id number]   HYDROcodone -acetaminophen  (NORCO/VICODIN) 5-325 MG tablet    Sig: Take 1  tablet by mouth every 6 (six) hours as needed.    Dispense:  20 tablet    Refill:  0    Supervising Provider:   COX, KIRSTEN [id number]    Follow Up:  In Person prn  Signed, CAMIE JONELLE MOATS, PA-C  11/17/2023 11:45 AM    [facility] [place]

## 2023-11-22 DIAGNOSIS — F331 Major depressive disorder, recurrent, moderate: Secondary | ICD-10-CM | POA: Diagnosis not present

## 2023-12-05 ENCOUNTER — Ambulatory Visit

## 2023-12-05 DIAGNOSIS — Z23 Encounter for immunization: Secondary | ICD-10-CM | POA: Diagnosis not present

## 2023-12-06 ENCOUNTER — Ambulatory Visit: Admitting: Physician Assistant

## 2023-12-08 ENCOUNTER — Other Ambulatory Visit (HOSPITAL_BASED_OUTPATIENT_CLINIC_OR_DEPARTMENT_OTHER): Payer: Self-pay

## 2023-12-08 ENCOUNTER — Other Ambulatory Visit: Payer: Self-pay | Admitting: Physician Assistant

## 2023-12-08 DIAGNOSIS — R1031 Right lower quadrant pain: Secondary | ICD-10-CM

## 2023-12-08 MED ORDER — HYDROCODONE-ACETAMINOPHEN 5-325 MG PO TABS
1.0000 | ORAL_TABLET | Freq: Four times a day (QID) | ORAL | 0 refills | Status: DC | PRN
  Filled 2023-12-08: qty 8, 2d supply, fill #0
  Filled 2023-12-08: qty 12, 3d supply, fill #0

## 2023-12-12 DIAGNOSIS — K5904 Chronic idiopathic constipation: Secondary | ICD-10-CM | POA: Diagnosis not present

## 2023-12-12 DIAGNOSIS — N815 Vaginal enterocele: Secondary | ICD-10-CM | POA: Diagnosis not present

## 2023-12-12 DIAGNOSIS — N8110 Cystocele, unspecified: Secondary | ICD-10-CM | POA: Diagnosis not present

## 2023-12-12 DIAGNOSIS — N816 Rectocele: Secondary | ICD-10-CM | POA: Diagnosis not present

## 2023-12-19 ENCOUNTER — Encounter: Payer: Self-pay | Admitting: Physician Assistant

## 2023-12-19 ENCOUNTER — Other Ambulatory Visit (HOSPITAL_BASED_OUTPATIENT_CLINIC_OR_DEPARTMENT_OTHER): Payer: Self-pay

## 2023-12-19 ENCOUNTER — Other Ambulatory Visit: Payer: Self-pay | Admitting: Obstetrics and Gynecology

## 2023-12-19 ENCOUNTER — Other Ambulatory Visit: Payer: Self-pay | Admitting: Physician Assistant

## 2023-12-19 ENCOUNTER — Ambulatory Visit (INDEPENDENT_AMBULATORY_CARE_PROVIDER_SITE_OTHER): Admitting: Physician Assistant

## 2023-12-19 ENCOUNTER — Encounter: Payer: Self-pay | Admitting: Obstetrics and Gynecology

## 2023-12-19 VITALS — BP 140/82 | HR 97 | Resp 18 | Ht 63.0 in | Wt 192.0 lb

## 2023-12-19 DIAGNOSIS — D513 Other dietary vitamin B12 deficiency anemia: Secondary | ICD-10-CM

## 2023-12-19 DIAGNOSIS — Z905 Acquired absence of kidney: Secondary | ICD-10-CM

## 2023-12-19 DIAGNOSIS — K140 Glossitis: Secondary | ICD-10-CM

## 2023-12-19 DIAGNOSIS — D508 Other iron deficiency anemias: Secondary | ICD-10-CM

## 2023-12-19 DIAGNOSIS — N1330 Unspecified hydronephrosis: Secondary | ICD-10-CM

## 2023-12-19 DIAGNOSIS — E559 Vitamin D deficiency, unspecified: Secondary | ICD-10-CM | POA: Diagnosis not present

## 2023-12-19 DIAGNOSIS — N814 Uterovaginal prolapse, unspecified: Secondary | ICD-10-CM

## 2023-12-19 MED ORDER — NYSTATIN 100000 UNIT/ML MT SUSP
10.0000 mL | Freq: Three times a day (TID) | OROMUCOSAL | 0 refills | Status: DC
  Filled 2023-12-19: qty 240, 8d supply, fill #0

## 2023-12-19 NOTE — Progress Notes (Signed)
 Acute Office Visit  Subjective:    Patient ID: Michelle Lamb, female    DOB: [birth year], 33 y.o.   MRN: [id number]  Chief Complaint  Patient presents with   painful tongue    HPI: Patient is in today for complaints inside of mouth/lips and tongue feel raw and numb - started on Sunday.  Denies fever, cough, cold and congestion. Pt does have Vit D def, B12 def and history of iron  deficiency with last iron  infusion about 4 months ago   Current Outpatient Medications:    magic mouthwash (nystatin, diphenhydrAMINE , alum & mag hydroxide) suspension mixture, Swish and spit 10 mLs 3 (three) times daily., Disp: 240 mL, Rfl: 0   albuterol  (VENTOLIN  HFA) 108 (90 Base) MCG/ACT inhaler, Inhale 2 puffs into the lungs every 6 (six) hours as needed for wheezing or shortness of breath., Disp: 1 each, Rfl: 5   Continuous Glucose Sensor (FREESTYLE LIBRE 3 SENSOR) MISC, Use every 14 days - apply as directed, Disp: 2 each, Rfl: 5   cyanocobalamin  (VITAMIN B12) 1000 MCG/ML injection, Inject 1 mL (1,000 mcg total) into the muscle every 30 (thirty) days., Disp: 1 mL, Rfl: 2   fluticasone  (FLONASE ) 50 MCG/ACT nasal spray, Place 2 sprays into both nostrils daily. (Patient taking differently: Place 2 sprays into both nostrils daily as needed for allergies or rhinitis.), Disp: 16 g, Rfl: 6   HYDROcodone -acetaminophen  (NORCO/VICODIN) 5-325 MG tablet, Take 1 tablet by mouth every 6 (six) hours as needed., Disp: 20 tablet, Rfl: 0   linaclotide  (LINZESS ) 290 MCG CAPS capsule, Take one capsule on an empty stomach 30 minutes prior to first meal of the day. Keep medicine in its original container., Disp: 90 capsule, Rfl: 4   lisdexamfetamine (VYVANSE ) 70 MG capsule, Take 1 capsule (70 mg total) by mouth daily., Disp: 30 capsule, Rfl: 0   LORazepam  (ATIVAN ) 1 MG tablet, Take 1 tablet (1 mg total) by mouth daily as needed., Disp: 30 tablet, Rfl: 1   meclizine  (ANTIVERT ) 25 MG tablet, Take 1 tablet (25 mg total) by  mouth 3 (three) times daily as needed for dizziness., Disp: 30 tablet, Rfl: 0   methocarbamol  (ROBAXIN ) 500 MG tablet, Take 1 tablet (500 mg total) by mouth 3 (three) times daily., Disp: 30 tablet, Rfl: 0   Multiple Vitamin (MULTIVITAMIN WITH MINERALS) TABS tablet, Take 1 tablet by mouth daily., Disp: , Rfl:    ondansetron  (ZOFRAN -ODT) 4 MG disintegrating tablet, Take 1 tablet (4 mg total) by mouth every 8 (eight) hours as needed for nausea or vomiting., Disp: 20 tablet, Rfl: 0   pantoprazole  (PROTONIX ) 40 MG tablet, Take 1 tablet (40 mg total) by mouth daily., Disp: 30 tablet, Rfl: 11   Sodium Fluoride  (SODIUM FLUORIDE  5000 PPM) 1.1 % PSTE, Apply 1 application to the teeth twice a day; . Floss first, then brush 2 times daily with PreviDent toothpaste. Expectorate. Do not rinse, use mouth rinse, eat, or drink for 30 min after use., Disp: 100 mL, Rfl: 5   venlafaxine  XR (EFFEXOR  XR) 150 MG 24 hr capsule, Take 1 capsule (150 mg total) by mouth daily with breakfast., Disp: 90 capsule, Rfl: 0   Vitamin D , Ergocalciferol , (DRISDOL ) 1.25 MG (50000 UNIT) CAPS capsule, Take 1 capsule (50,000 Units total) by mouth 2 (two) times a week., Disp: 8 capsule, Rfl: 3  Allergies  Allergen Reactions   Bactrim  [Sulfamethoxazole -Trimethoprim ] Hives   Dilaudid  [Hydromorphone ] Rash   Sulfa  Antibiotics Rash    ROS CONSTITUTIONAL: Negative for chills,  fatigue, fever, E/N/T: see HPI CARDIOVASCULAR: Negative for chest pain,  RESPIRATORY: Negative for recent cough and dyspnea.   INTEGUMENTARY: Negative for rash.       Objective:    PHYSICAL EXAM:   BP (!) 140/82   Pulse 97   Resp 18   Ht 5' 3 (1.6 m)   Wt 192 lb (87.1 kg)   LMP 12/04/2022 (Exact Date)   SpO2 100%   BMI 34.01 kg/m    GEN: Well nourished, well developed, in no acute distress  HEENT: lips normal - tongue with mild film noted and smoothness of tongue noted Oropharynx - inside of lips red, inflamed Cardiac: RRR;  Respiratory:  normal  respiratory rate and pattern with no distress - normal breath sounds with no rales, rhonchi, wheezes or rubs      Assessment & Plan:    Glossitis -     Fe+CBC/D/Plt+TIBC+Fer+Retic -     B12 and Folate Panel -     VITAMIN D  25 Hydroxy (Vit-D Deficiency, Fractures) -     magic mouthwash (nystatin, diphenhydrAMINE , alum & mag hydroxide) suspension mixture; Swish and spit 10 mLs 3 (three) times daily.  Dispense: 240 mL; Refill: 0  Vitamin D  deficiency -     VITAMIN D  25 Hydroxy (Vit-D Deficiency, Fractures)  Other dietary vitamin B12 deficiency anemia -     B12 and Folate Panel  Iron  deficiency anemia secondary to inadequate dietary iron  intake -     Fe+CBC/D/Plt+TIBC+Fer+Retic     Follow-up: Return if symptoms worsen or fail to improve.  An After Visit Summary was printed and given to the patient.  CAMIE JONELLE NICHOLAUS DEVONNA [facility] [phone]

## 2023-12-20 ENCOUNTER — Other Ambulatory Visit (HOSPITAL_BASED_OUTPATIENT_CLINIC_OR_DEPARTMENT_OTHER): Payer: Self-pay

## 2023-12-20 ENCOUNTER — Other Ambulatory Visit: Payer: Self-pay | Admitting: Physician Assistant

## 2023-12-20 ENCOUNTER — Ambulatory Visit: Payer: Self-pay | Admitting: Physician Assistant

## 2023-12-20 DIAGNOSIS — R41840 Attention and concentration deficit: Secondary | ICD-10-CM

## 2023-12-20 DIAGNOSIS — N1330 Unspecified hydronephrosis: Secondary | ICD-10-CM | POA: Diagnosis not present

## 2023-12-20 DIAGNOSIS — Z905 Acquired absence of kidney: Secondary | ICD-10-CM | POA: Diagnosis not present

## 2023-12-20 DIAGNOSIS — D513 Other dietary vitamin B12 deficiency anemia: Secondary | ICD-10-CM

## 2023-12-20 LAB — FE+CBC/D/PLT+TIBC+FER+RETIC
Basophils Absolute: 0.1 x10E3/uL (ref 0.0–0.2)
Basos: 0 %
EOS (ABSOLUTE): 0.3 x10E3/uL (ref 0.0–0.4)
Eos: 2 %
Ferritin: 23 ng/mL (ref 15–150)
Hematocrit: 40.8 % (ref 34.0–46.6)
Hemoglobin: 13.1 g/dL (ref 11.1–15.9)
Immature Grans (Abs): 0 x10E3/uL (ref 0.0–0.1)
Immature Granulocytes: 0 %
Iron Saturation: 17 % (ref 15–55)
Iron: 58 ug/dL (ref 27–159)
Lymphocytes Absolute: 2.6 x10E3/uL (ref 0.7–3.1)
Lymphs: 21 %
MCH: 28.6 pg (ref 26.6–33.0)
MCHC: 32.1 g/dL (ref 31.5–35.7)
MCV: 89 fL (ref 79–97)
Monocytes Absolute: 0.5 x10E3/uL (ref 0.1–0.9)
Monocytes: 4 %
Neutrophils Absolute: 9.2 x10E3/uL — ABNORMAL HIGH (ref 1.4–7.0)
Neutrophils: 73 %
Platelets: 320 x10E3/uL (ref 150–450)
RBC: 4.58 x10E6/uL (ref 3.77–5.28)
RDW: 13.1 % (ref 11.7–15.4)
Retic Ct Pct: 0.9 % (ref 0.6–2.6)
Total Iron Binding Capacity: 336 ug/dL (ref 250–450)
UIBC: 278 ug/dL (ref 131–425)
WBC: 12.6 x10E3/uL — ABNORMAL HIGH (ref 3.4–10.8)

## 2023-12-20 LAB — B12 AND FOLATE PANEL
Folate: 6 ng/mL (ref >3.0)
Vitamin B-12: 449 pg/mL (ref 232–1245)

## 2023-12-20 LAB — VITAMIN D 25 HYDROXY (VIT D DEFICIENCY, FRACTURES): Vit D, 25-Hydroxy: 17.1 ng/mL — ABNORMAL LOW (ref 30.0–100.0)

## 2023-12-20 MED ORDER — CYANOCOBALAMIN 1000 MCG/ML IJ SOLN
1000.0000 ug | INTRAMUSCULAR | 2 refills | Status: AC
  Filled 2023-12-20: qty 1, 30d supply, fill #0
  Filled 2024-03-08: qty 1, 30d supply, fill #1

## 2023-12-20 MED ORDER — LISDEXAMFETAMINE DIMESYLATE 70 MG PO CAPS: 70.0000 mg | ORAL_CAPSULE | Freq: Every day | ORAL | 0 refills | Status: DC

## 2023-12-21 ENCOUNTER — Other Ambulatory Visit (HOSPITAL_BASED_OUTPATIENT_CLINIC_OR_DEPARTMENT_OTHER): Payer: Self-pay

## 2023-12-21 ENCOUNTER — Ambulatory Visit: Admitting: Physician Assistant

## 2023-12-21 VITALS — BP 164/62 | HR 79 | Temp 97.2°F | Resp 18 | Ht 63.0 in | Wt 191.0 lb

## 2023-12-21 DIAGNOSIS — R41840 Attention and concentration deficit: Secondary | ICD-10-CM

## 2023-12-21 DIAGNOSIS — F418 Other specified anxiety disorders: Secondary | ICD-10-CM

## 2023-12-21 LAB — URINALYSIS, ROUTINE W REFLEX MICROSCOPIC
Bilirubin, UA: NEGATIVE
Glucose, UA: NEGATIVE
Ketones, UA: NEGATIVE
Leukocytes,UA: NEGATIVE
Nitrite, UA: NEGATIVE
Protein,UA: NEGATIVE
RBC, UA: NEGATIVE
Specific Gravity, UA: 1.025 (ref 1.005–1.030)
Urobilinogen, Ur: 1 mg/dL (ref 0.2–1.0)
pH, UA: 6 (ref 5.0–7.5)

## 2023-12-21 LAB — RENAL FUNCTION PANEL
Albumin: 4.2 g/dL (ref 3.9–4.9)
BUN/Creatinine Ratio: 13 (ref 9–23)
BUN: 8 mg/dL (ref 6–20)
CO2: 24 mmol/L (ref 20–29)
Calcium: 9 mg/dL (ref 8.7–10.2)
Chloride: 100 mmol/L (ref 96–106)
Creatinine, Ser: 0.6 mg/dL (ref 0.57–1.00)
Glucose: 85 mg/dL (ref 70–99)
Phosphorus: 2.8 mg/dL — ABNORMAL LOW (ref 3.0–4.3)
Potassium: 4.1 mmol/L (ref 3.5–5.2)
Sodium: 138 mmol/L (ref 134–144)
eGFR: 120 mL/min/1.73 (ref >59)

## 2023-12-21 LAB — PARATHYROID HORMONE, INTACT (NO CA): PTH: 82 pg/mL — ABNORMAL HIGH (ref 15–65)

## 2023-12-21 MED ORDER — AMPHETAMINE-DEXTROAMPHETAMINE 10 MG PO TABS
10.0000 mg | ORAL_TABLET | Freq: Two times a day (BID) | ORAL | 0 refills | Status: DC
  Filled 2023-12-21: qty 60, 30d supply, fill #0

## 2023-12-21 NOTE — Progress Notes (Signed)
 Subjective:  Patient ID: Michelle Lamb, female    DOB: Dec 02, 1990  Age: 33 y.o. MRN: [id number]  Chief Complaint  Patient presents with   ADD    HPI Pt in today with concerns of breakthrough symptoms of ADD - she states that she has been Vyvanse  70mg  qd for awhile and it has been working until recently.  She is feeling stressed at home and work and feels she is pulled in different directions.  She is not able to focus well and not sleeping well.  She would like to try another medication .  She feels like in mornings the med works well but feels it wears off in the afternoon.  She has not tried other medications in the past  Pt has had some situational anxiety and depression this week after having an argument with a friend.  She states otherwise her effexor  and ativan  have been working well for her She is talking with a therapist every 2 weeks     12/21/2023   11:42 AM 10/06/2023   11:11 AM 09/27/2023    9:35 AM 05/29/2023    1:23 PM 10/19/2022    4:23 PM  Depression screen PHQ 2/9  Decreased Interest 2 0 3 2 1   Down, Depressed, Hopeless 3 0 2 2 1   PHQ - 2 Score 5 0 5 4 2   Altered sleeping 3  3 2 2   Tired, decreased energy 2  2 2 1   Change in appetite 1  0 0 1  Feeling bad or failure about yourself  2  1 1    Trouble concentrating 3  2 3  0  Moving slowly or fidgety/restless 0  0 0 0  Suicidal thoughts 1  0 0 0  PHQ-9 Score 17  13 12 6   Difficult doing work/chores Very difficult  Very difficult Very difficult Somewhat difficult        11/14/2022   10:55 AM 12/14/2022   12:06 PM 05/29/2023    1:23 PM 10/06/2023   11:11 AM 12/21/2023   11:42 AM  Fall Risk  Falls in the past year? 0 0 0 0 0  Was there an injury with Fall? 0 0 0 0 0  Fall Risk Category Calculator 0 0 0 0 0  Patient at Risk for Falls Due to No Fall Risks No Fall Risks No Fall Risks No Fall Risks No Fall Risks  Fall risk Follow up Falls evaluation completed Falls evaluation completed Falls evaluation completed  Falls evaluation completed Falls evaluation completed     ROS CONSTITUTIONAL: Negative for chills, fatigue, fever,   CARDIOVASCULAR: Negative for chest pain, dizziness, palpitations and pedal edema.  RESPIRATORY: Negative for recent cough and dyspnea.   PSYCHIATRIC: see HPI   Current Outpatient Medications:    amphetamine-dextroamphetamine (ADDERALL) 10 MG tablet, Take 1 tablet (10 mg total) by mouth 2 (two) times daily with a meal., Disp: 60 tablet, Rfl: 0   albuterol  (VENTOLIN  HFA) 108 (90 Base) MCG/ACT inhaler, Inhale 2 puffs into the lungs every 6 (six) hours as needed for wheezing or shortness of breath., Disp: 1 each, Rfl: 5   Continuous Glucose Sensor (FREESTYLE LIBRE 3 SENSOR) MISC, Use every 14 days - apply as directed, Disp: 2 each, Rfl: 5   cyanocobalamin  (VITAMIN B12) 1000 MCG/ML injection, Inject 1 mL (1,000 mcg total) into the muscle every 30 (thirty) days., Disp: 1 mL, Rfl: 2   fluticasone  (FLONASE ) 50 MCG/ACT nasal spray, Place 2 sprays into both nostrils daily. (Patient  taking differently: Place 2 sprays into both nostrils daily as needed for allergies or rhinitis.), Disp: 16 g, Rfl: 6   HYDROcodone -acetaminophen  (NORCO/VICODIN) 5-325 MG tablet, Take 1 tablet by mouth every 6 (six) hours as needed., Disp: 20 tablet, Rfl: 0   linaclotide  (LINZESS ) 290 MCG CAPS capsule, Take one capsule on an empty stomach 30 minutes prior to first meal of the day. Keep medicine in its original container., Disp: 90 capsule, Rfl: 4   LORazepam  (ATIVAN ) 1 MG tablet, Take 1 tablet (1 mg total) by mouth daily as needed., Disp: 30 tablet, Rfl: 1   magic mouthwash (nystatin, diphenhydrAMINE , alum & mag hydroxide) suspension mixture, Swish and spit 10 mLs 3 (three) times daily., Disp: 240 mL, Rfl: 0   meclizine  (ANTIVERT ) 25 MG tablet, Take 1 tablet (25 mg total) by mouth 3 (three) times daily as needed for dizziness., Disp: 30 tablet, Rfl: 0   methocarbamol  (ROBAXIN ) 500 MG tablet, Take 1 tablet (500  mg total) by mouth 3 (three) times daily., Disp: 30 tablet, Rfl: 0   Multiple Vitamin (MULTIVITAMIN WITH MINERALS) TABS tablet, Take 1 tablet by mouth daily., Disp: , Rfl:    ondansetron  (ZOFRAN -ODT) 4 MG disintegrating tablet, Take 1 tablet (4 mg total) by mouth every 8 (eight) hours as needed for nausea or vomiting., Disp: 20 tablet, Rfl: 0   pantoprazole  (PROTONIX ) 40 MG tablet, Take 1 tablet (40 mg total) by mouth daily., Disp: 30 tablet, Rfl: 11   Sodium Fluoride  (SODIUM FLUORIDE  5000 PPM) 1.1 % PSTE, Apply 1 application to the teeth twice a day; . Floss first, then brush 2 times daily with PreviDent toothpaste. Expectorate. 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   Blood transfusion without reported diagnosis 2018   postpartum   Chest pain of uncertain etiology 06/08/2022   06/15/22 Myocardial perfusion - normal, 09/15/22 long term heart monitor in Epic   Chronic kidney disease    right kidney at 35%   Complication of anesthesia    Slow to wake up   Constipation 06/28/2022   Diabetes mellitus without complication (HCC)    NOT AN ISSUE SINCE GASTRIC BYPASS IN 2022. TYPE 2 LAST DOSE FRIDAY December 24, 2020 (METFORMIN)    Dizziness 08/22/2022   Gastroesophageal reflux disease without esophagitis 09/08/2021   Genetic testing 03/03/2022   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  Report date is 02/23/2022.      The CancerNext-Expanded gene panel offered by [facility] and includes sequencing, rearrangement, and RNA analysis for the following 77 genes: AIP, ALK, APC, ATM, AXIN2, BAP1, BARD1, BLM, BMPR1A, BRCA1, BRCA2, BRIP1, CDC73, CDH1, CDK4, CDKN1B, CDKN2A, CHEK2, CTNNA1, DICER1, FANCC,  FH, FLCN, GALNT12, KIF1B, LZTR1,   Hepatic steatosis 09/08/2021   History of herpes simplex infection    History of partial nephrectomy 03/21/2022   Formatting of this note might be different from the original. 03/15/2021: Underwent right partial nephrectomy by Dr. Renda for right renal neoplasm. Pathology: Angiomyolipoma. Formatting of this note might be different from the original. 03/15/2021: Underwent right partial nephrectomy by Dr. Renda for right renal neoplasm. Pathology: Angiomyolipoma.   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   Morbid obesity (HCC) 06/02/2020   Neoplasm of right kidney    benign tumor of kidney   Obesity (BMI 30.0-34.9) 06/08/2022   Obstructive sleep apnea 10/29/2019   hx of before gastric bypass, no longer uses CPAP   Racing heart beat 08/22/2022   see 09/15/22 Long term heart monitor in Epic   Seasonal allergies    Sleep apnea    Ureteral obstruction, right 07/15/2021   Vaginal delivery 2009, 2010, 2015   Vitamin D  insufficiency 09/30/2020   Weight loss 09/30/2020   Objective:  PHYSICAL EXAM:   BP (!) 164/62   Pulse 79   Temp (!) 97.2  F (36.2 C) (Temporal)   Resp 18   Ht 5' 3 (1.6 m)   Wt 191 lb (86.6 kg)   LMP 12/04/2022 (Exact Date)   SpO2 97%   BMI 33.83 kg/m    GEN: Well nourished, well developed, in no acute distress  Cardiac: RRR; no murmurs, rubs, or gallops,no edema - Respiratory:  normal respiratory rate and pattern with no distress - normal breath sounds with no rales, rhonchi, wheezes or rubs  Psych: euthymic mood, appropriate affect and demeanor  Assessment & Plan:    Anxiety with depression Continue current meds Follow up with therapist as directed Attention or concentration deficit -     Amphetamine-Dextroamphetamine; Take 1 tablet (10 mg total) by mouth 2 (two) times daily with a meal.  Dispense: 60 tablet; Refill: 0     Follow-up: Return in about 4 weeks (around 01/18/2024) for follow-up.  An After Visit Summary was printed and given to the patient.  CAMIE JONELLE NICHOLAUS DEVONNA [facility] [phone]

## 2023-12-22 ENCOUNTER — Other Ambulatory Visit (HOSPITAL_BASED_OUTPATIENT_CLINIC_OR_DEPARTMENT_OTHER): Payer: Self-pay

## 2023-12-22 ENCOUNTER — Ambulatory Visit: Payer: Self-pay | Admitting: Physician Assistant

## 2023-12-22 DIAGNOSIS — E559 Vitamin D deficiency, unspecified: Secondary | ICD-10-CM | POA: Diagnosis not present

## 2023-12-22 DIAGNOSIS — M898X9 Other specified disorders of bone, unspecified site: Secondary | ICD-10-CM | POA: Diagnosis not present

## 2023-12-22 DIAGNOSIS — E119 Type 2 diabetes mellitus without complications: Secondary | ICD-10-CM | POA: Diagnosis not present

## 2023-12-22 DIAGNOSIS — N031 Chronic nephritic syndrome with focal and segmental glomerular lesions: Secondary | ICD-10-CM | POA: Diagnosis not present

## 2023-12-22 DIAGNOSIS — E669 Obesity, unspecified: Secondary | ICD-10-CM | POA: Diagnosis not present

## 2023-12-22 DIAGNOSIS — Z905 Acquired absence of kidney: Secondary | ICD-10-CM | POA: Diagnosis not present

## 2023-12-29 ENCOUNTER — Ambulatory Visit: Admitting: Family Medicine

## 2023-12-29 ENCOUNTER — Other Ambulatory Visit (HOSPITAL_BASED_OUTPATIENT_CLINIC_OR_DEPARTMENT_OTHER): Payer: Self-pay

## 2023-12-29 ENCOUNTER — Encounter: Payer: Self-pay | Admitting: Family Medicine

## 2023-12-29 VITALS — BP 110/62 | HR 87 | Temp 97.8°F | Resp 18 | Ht 63.0 in | Wt 194.0 lb

## 2023-12-29 DIAGNOSIS — G8929 Other chronic pain: Secondary | ICD-10-CM | POA: Diagnosis not present

## 2023-12-29 DIAGNOSIS — M545 Low back pain, unspecified: Secondary | ICD-10-CM | POA: Diagnosis not present

## 2023-12-29 DIAGNOSIS — F418 Other specified anxiety disorders: Secondary | ICD-10-CM

## 2023-12-29 DIAGNOSIS — M25512 Pain in left shoulder: Secondary | ICD-10-CM

## 2023-12-29 MED ORDER — TRIAMCINOLONE ACETONIDE 40 MG/ML IJ SUSP
80.0000 mg | Freq: Once | INTRAMUSCULAR | Status: AC
  Administered 2023-12-29: 80 mg via INTRAMUSCULAR

## 2023-12-29 MED ORDER — HYDROCODONE-ACETAMINOPHEN 5-325 MG PO TABS
1.0000 | ORAL_TABLET | Freq: Four times a day (QID) | ORAL | 0 refills | Status: DC | PRN
  Filled 2023-12-29: qty 20, 5d supply, fill #0

## 2023-12-29 NOTE — Assessment & Plan Note (Signed)
 Low back pain Chronic low back pain worsened by muscle loss from weight loss. Orthopedic referral in place. Prednisone  available for flare-ups. - Continue with orthopedic referral in December. - Use prednisone  as needed for flare-ups.

## 2023-12-29 NOTE — Assessment & Plan Note (Addendum)
 Pain in left shoulder Acute left shoulder pain with preserved range of motion, discomfort on movement. Previous physical therapy ineffective. Prefers buttock steroid injection. - Administer steroid injection in the buttock. - Prescribe hydrocodone  as needed. Orders:   triamcinolone  acetonide (KENALOG -40) injection 80 mg   HYDROcodone -acetaminophen  (NORCO/VICODIN) 5-325 MG tablet; Take 1 tablet by mouth every 6 (six) hours as needed.

## 2023-12-29 NOTE — Assessment & Plan Note (Deleted)
 Michelle Lamb

## 2023-12-29 NOTE — Progress Notes (Signed)
 Acute Office Visit  Subjective:    Patient ID: Michelle Lamb, female    DOB: 10-29-1990, 33 y.o.   MRN: [id number]  Chief Complaint  Patient presents with   Back Pain   Shoulder Pain    Discussed the use of AI scribe software for clinical note transcription with the patient, who gave verbal consent to proceed.  History of Present Illness   Michelle Lamb is a 33 year old female who presents with left shoulder pain after an incident at work.  Left shoulder pain - Acute onset following a work-related incident in which a patient put her weight on her left shoulder - Pain is described as tender and sore, localized to the left shoulder - Movement exacerbates the pain, but she maintains full range of motion in the arm - Rest and heat application provide partial relief - No prior injuries to the left shoulder, but has experienced shoulder pain in the past that did not improve with physical therapy - Muscle relaxer taken the previous night did not alleviate shoulder pain - Currently using hydrocodone  as needed, taking a quarter tablet at a time  Back pain - Chronic back pain attributed to muscle loss from weight loss - Muscle relaxer taken the previous night provided relief for back pain - Uses prednisone  as needed for back pain  Depressive symptoms - Recent episode of severe depression related to personal stressors - Depression impacted her ability to function at work last week - Currently on medication for mental health and feels symptoms have improved - Prescribed Adderall, but feels it is not effective at the current dose - Previously on Vyvanse , which she preferred       Discussed the use of AI scribe software for clinical note transcription with the patient, who gave verbal consent to proceed.      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   Blood transfusion without reported diagnosis 2018   postpartum   Chest pain of uncertain etiology 06/08/2022   06/15/22 Myocardial perfusion - normal, 09/15/22 long term heart monitor in Epic   Chronic kidney disease    right kidney at 35%   Complication of anesthesia    Slow to wake up   Constipation 06/28/2022   Diabetes mellitus without complication (HCC)    NOT AN ISSUE SINCE GASTRIC BYPASS IN 2022. 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  Report date is 02/23/2022.      The CancerNext-Expanded gene panel offered by [facility] and includes sequencing, rearrangement, and RNA analysis for the following 77 genes: AIP, ALK, APC, ATM, AXIN2, BAP1, BARD1, BLM, BMPR1A, BRCA1, BRCA2, BRIP1, CDC73, CDH1, CDK4, CDKN1B, CDKN2A, CHEK2, CTNNA1, DICER1, FANCC, FH, FLCN, GALNT12, KIF1B, LZTR1,   Hepatic steatosis 09/08/2021   History of herpes simplex infection    History of partial nephrectomy 03/21/2022   Formatting of this note might be different from the original. 03/15/2021: Underwent right partial nephrectomy  by Dr. Renda for right renal neoplasm. Pathology: Angiomyolipoma. Formatting of this note might be different from the original. 03/15/2021: Underwent right partial nephrectomy by Dr. Renda for right renal neoplasm. Pathology: Angiomyolipoma.   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   Morbid obesity (HCC) 06/02/2020   Neoplasm of right kidney    benign tumor of kidney   Obesity (BMI 30.0-34.9) 06/08/2022   Obstructive sleep apnea 10/29/2019   hx of before gastric bypass, no longer uses CPAP   Racing heart beat 08/22/2022   see 09/15/22 Long term heart monitor in Epic   Seasonal allergies    Sleep apnea    Ureteral obstruction, right 07/15/2021   Vaginal delivery 2009, 2010, 2015   Vitamin D  insufficiency 09/30/2020   Weight loss 09/30/2020    Past Surgical History:  Procedure Laterality Date   ABDOMINAL HYSTERECTOMY  2024   APPENDECTOMY     CESAREAN SECTION  09/29/2016   CHOLECYSTECTOMY     COLONOSCOPY WITH ESOPHAGOGASTRODUODENOSCOPY (EGD)  07/22/2022   CYSTOSCOPY N/A 12/27/2022   Procedure: CYSTOSCOPY;  Surgeon: Glennon Almarie POUR, MD;  Location: [facility];  Service: Gynecology;  Laterality: N/A;   CYSTOSCOPY W/ URETERAL STENT PLACEMENT Right 07/15/2021   Procedure: CYSTOSCOPY WITH RETROGRADE PYELOGRAM/URETERAL STENT PLACEMENT;  Surgeon: Renda Glance, MD;  Location: [facility];  Service: Urology;  Laterality: Right;   CYSTOSCOPY WITH RETROGRADE PYELOGRAM, URETEROSCOPY AND STENT PLACEMENT Right 05/03/2021   Procedure: CYSTOSCOPY WITH RIGHT RETROGRADE PYELOGRAM, URETEROSCOPY;  Surgeon: Renda Glance, MD;  Location: [facility];  Service: Urology;  Laterality: Right;   DILATION AND CURETTAGE OF UTERUS N/A 12/31/2015   Procedure: SUCTION DILATATION AND CURETTAGE;  Surgeon: Bebe Furry, MD;  Location: [facility];  Service: Gynecology;  Laterality: N/A;   DILATION AND EVACUATION N/A 01/01/2016   Procedure: DILATATION AND EVACUATION;  Surgeon: Norleen LULLA Server, MD;  Location: [facility];  Service: Gynecology;  Laterality: N/A;   ESOPHAGOGASTRODUODENOSCOPY      GASTRIC BYPASS  06/02/2020   IR NEPHRO TUBE REMOV/FL  07/16/2021   IR NEPHROSTOMY EXCHANGE RIGHT  05/12/2021   IR NEPHROSTOMY PLACEMENT RIGHT  05/05/2021   PANNICULECTOMY N/A 08/25/2023   Procedure: PANNICULECTOMY;  Surgeon: Waddell Leonce NOVAK, MD;  Location: [facility];  Service: Plastics;  Laterality: N/A;   ROBOT ASSISTED PYELOPLASTY Right 07/15/2021   Procedure: XI ROBOTIC ASSISTED  LAPAROSCOPIC PYELOPLASTY;  Surgeon: Renda Glance, MD;  Location: [facility];  Service: Urology;  Laterality: Right;   ROBOTIC ASSISTED LAPAROSCOPIC HYSTERECTOMY AND SALPINGECTOMY Bilateral 12/27/2022   Procedure: XI ROBOTIC ASSISTED LAPAROSCOPIC HYSTERECTOMY AND SALPINGECTOMY;  Surgeon: Glennon Almarie POUR, MD;  Location: [facility];  Service: Gynecology;  Laterality: Bilateral;   ROBOTIC ASSITED PARTIAL NEPHRECTOMY Right 03/15/2021   Procedure: XI ROBOTIC ASSITED PARTIAL NEPHRECTOMY;  Surgeon: Renda Glance, MD;  Location: [facility];  Service: Urology;  Laterality: Right;   TONSILLECTOMY     TUBAL LIGATION     UPPER GASTROINTESTINAL ENDOSCOPY     w/dilation    Family History  Problem Relation Age of Onset   Hypertension Mother    Thyroid  disease Mother  Cervical cancer Mother        dx 16s   Depression Mother    Diabetes Mother    Hyperlipidemia Mother    Anxiety disorder Mother    Asthma Father    Stomach cancer Maternal Grandmother        dx 16s   Leukemia Maternal Grandfather        d. 26s   Depression Paternal Grandmother    Diabetes Paternal Grandmother    Cancer Paternal Grandfather        dx after 26; mets   Breast cancer Maternal Great-grandmother        dx <50; mets; MGF's mother   Stomach cancer Other        MGM's brother; dx after 69   Cancer Other        MGF's sisters x3; unknown primary   ADD / ADHD Daughter    Colon cancer Neg Hx    Colon polyps Neg Hx    Esophageal cancer Neg Hx    Rectal cancer Neg Hx     Social History   Socioeconomic  History   Marital status: Married    Spouse name: Not on file   Number of children: 9   Years of education: 13   Highest education level: Tax Adviser degree: academic program  Occupational History   Occupation: Scientist, Forensic  Tobacco Use   Smoking status: Never   Smokeless tobacco: Never  Vaping Use   Vaping status: Never Used  Substance and Sexual Activity   Alcohol use: Yes    Comment: occasionally   Drug use: Never   Sexual activity: Not Currently    Partners: Male    Birth control/protection: Surgical    Comment: tubal, hysterectomy  Other Topics Concern   Not on file  Social History Narrative   Not on file   Social Drivers of Health   Financial Resource Strain: Low Risk  (12/21/2023)   Overall Financial Resource Strain (CARDIA)    Difficulty of Paying Living Expenses: Not very hard  Food Insecurity: No Food Insecurity (12/21/2023)   Hunger Vital Sign    Worried About Running Out of Food in the Last Year: Never true    Ran Out of Food in the Last Year: Never true  Transportation Needs: No Transportation Needs (12/21/2023)   PRAPARE - Administrator, Civil Service (Medical): No    Lack of Transportation (Non-Medical): No  Physical Activity: Insufficiently Active (12/21/2023)   Exercise Vital Sign    Days of Exercise per Week: 3 days    Minutes of Exercise per Session: 20 min  Stress: Stress Concern Present (12/21/2023)   [employer] of Occupational Health - Occupational Stress Questionnaire    Feeling of Stress: To some extent  Social Connections: Moderately Integrated (12/21/2023)   Social Connection and Isolation Panel    Frequency of Communication with Friends and Family: Patient declined    Frequency of Social Gatherings with Friends and Family: Three times a week    Attends Religious Services: 1 to 4 times per year    Active Member of Clubs or Organizations: No    Attends Banker Meetings: Not on file    Marital  Status: Married  Intimate Partner Violence: Not At Risk (09/29/2023)   Humiliation, Afraid, Rape, and Kick questionnaire    Fear of Current or Ex-Partner: No    Emotionally Abused: No    Physically Abused: No    Sexually Abused: No  Outpatient Medications Prior to Visit  Medication Sig Dispense Refill   albuterol  (VENTOLIN  HFA) 108 (90 Base) MCG/ACT inhaler Inhale 2 puffs into the lungs every 6 (six) hours as needed for wheezing or shortness of breath. 1 each 5   amphetamine-dextroamphetamine (ADDERALL) 10 MG tablet Take 1 tablet (10 mg total) by mouth 2 (two) times daily with a meal. 60 tablet 0   Continuous Glucose Sensor (FREESTYLE LIBRE 3 SENSOR) MISC Use every 14 days - apply as directed 2 each 5   cyanocobalamin  (VITAMIN B12) 1000 MCG/ML injection Inject 1 mL (1,000 mcg total) into the muscle every 30 (thirty) days. 1 mL 2   fluticasone  (FLONASE ) 50 MCG/ACT nasal spray Place 2 sprays into both nostrils daily. (Patient taking differently: Place 2 sprays into both nostrils daily as needed for allergies or rhinitis.) 16 g 6   linaclotide  (LINZESS ) 290 MCG CAPS capsule Take one capsule on an empty stomach 30 minutes prior to first meal of the day. Keep medicine in its original container. 90 capsule 4   LORazepam  (ATIVAN ) 1 MG tablet Take 1 tablet (1 mg total) by mouth daily as needed. 30 tablet 1   magic mouthwash (nystatin, diphenhydrAMINE , alum & mag hydroxide) suspension mixture Swish and spit 10 mLs 3 (three) times daily. 240 mL 0   meclizine  (ANTIVERT ) 25 MG tablet Take 1 tablet (25 mg total) by mouth 3 (three) times daily as needed for dizziness. 30 tablet 0   methocarbamol  (ROBAXIN ) 500 MG tablet Take 1 tablet (500 mg total) by mouth 3 (three) times daily. 30 tablet 0   Multiple Vitamin (MULTIVITAMIN WITH MINERALS) TABS tablet Take 1 tablet by mouth daily.     ondansetron  (ZOFRAN -ODT) 4 MG disintegrating tablet Take 1 tablet (4 mg total) by mouth every 8 (eight) hours as needed for  nausea or vomiting. 20 tablet 0   pantoprazole  (PROTONIX ) 40 MG tablet Take 1 tablet (40 mg total) by mouth daily. 30 tablet 11   Sodium Fluoride  (SODIUM FLUORIDE  5000 PPM) 1.1 % PSTE Apply 1 application to the teeth twice a day; . Floss first, then brush 2 times daily with PreviDent toothpaste. Expectorate. Do not rinse, use mouth rinse, eat, or drink for 30 min after use. 100 mL 5   venlafaxine  XR (EFFEXOR  XR) 150 MG 24 hr capsule Take 1 capsule (150 mg total) by mouth daily with breakfast. 90 capsule 0   Vitamin D , Ergocalciferol , (DRISDOL ) 1.25 MG (50000 UNIT) CAPS capsule Take 1 capsule (50,000 Units total) by mouth 2 (two) times a week. 8 capsule 3   HYDROcodone -acetaminophen  (NORCO/VICODIN) 5-325 MG tablet Take 1 tablet by mouth every 6 (six) hours as needed. 20 tablet 0   No facility-administered medications prior to visit.    Allergies  Allergen Reactions   Bactrim  [Sulfamethoxazole -Trimethoprim ] Hives   Dilaudid  [Hydromorphone ] Rash   Sulfa  Antibiotics Rash    Review of Systems  Constitutional:  Negative for chills, diaphoresis, fatigue and fever.  HENT:  Negative for congestion, ear pain and sinus pain.   Respiratory:  Negative for cough and shortness of breath.   Cardiovascular:  Negative for chest pain.  Gastrointestinal:  Negative for abdominal pain, constipation, nausea and vomiting.  Genitourinary:  Negative for dysuria.  Musculoskeletal:  Positive for arthralgias (left shoulder) and back pain.  Neurological:  Negative for weakness and headaches.  Psychiatric/Behavioral:  Positive for dysphoric mood (improved). The patient is not nervous/anxious.        Objective:  12/29/2023    8:22 AM 12/21/2023   11:41 AM 12/21/2023   11:00 AM  Vitals with BMI  Height 5' 3 5' 3   Weight 194 lbs 191 lbs   BMI 34.37 33.84   Systolic 110 164 857  Diastolic 62 62 78  Pulse 87 79     No data found.   Physical Exam Constitutional:      General: She is not in  acute distress.    Appearance: Normal appearance. She is obese. She is not ill-appearing.  Eyes:     Conjunctiva/sclera: Conjunctivae normal.  Cardiovascular:     Rate and Rhythm: Normal rate and regular rhythm.     Heart sounds: Normal heart sounds. No murmur heard. Pulmonary:     Effort: Pulmonary effort is normal. No respiratory distress.     Breath sounds: Normal breath sounds. No wheezing.  Abdominal:     General: Bowel sounds are normal.     Palpations: Abdomen is soft.     Tenderness: There is no abdominal tenderness.  Musculoskeletal:     Right shoulder: Normal.     Left shoulder: Tenderness present. Decreased range of motion.     Cervical back: Normal range of motion.  Skin:    General: Skin is warm.  Neurological:     Mental Status: She is alert. Mental status is at baseline.  Psychiatric:        Mood and Affect: Mood normal.        Behavior: Behavior normal.     Health Maintenance Due  Topic Date Due   OPHTHALMOLOGY EXAM  09/16/2023    There are no preventive care reminders to display for this patient.   Lab Results  Component Value Date   TSH 0.880 10/11/2023   Lab Results  Component Value Date   WBC 12.6 (H) 12/19/2023   HGB 13.1 12/19/2023   HCT 40.8 12/19/2023   MCV 89 12/19/2023   PLT 320 12/19/2023   Lab Results  Component Value Date   NA 138 12/20/2023   K 4.1 12/20/2023   CO2 24 12/20/2023   GLUCOSE 85 12/20/2023   BUN 8 12/20/2023   CREATININE 0.60 12/20/2023   BILITOT 0.5 10/11/2023   ALKPHOS 114 10/11/2023   AST 21 10/11/2023   ALT 27 10/11/2023   PROT 7.1 10/11/2023   ALBUMIN 4.2 12/20/2023   CALCIUM 9.0 12/20/2023   ANIONGAP 6 09/30/2023   EGFR 120 12/20/2023   Lab Results  Component Value Date   CHOL 236 (H) 10/11/2023   Lab Results  Component Value Date   HDL 99 10/11/2023   Lab Results  Component Value Date   LDLCALC 129 (H) 10/11/2023   Lab Results  Component Value Date   TRIG 49 10/11/2023   Lab Results   Component Value Date   CHOLHDL 2.4 10/11/2023   Lab Results  Component Value Date   HGBA1C 4.8 10/11/2023        Results for orders placed or performed in visit on 12/19/23  Renal Function Panel   Collection Time: 12/20/23  8:17 AM  Result Value Ref Range   Glucose 85 70 - 99 mg/dL   BUN 8 6 - 20 mg/dL   Creatinine, Ser 9.39 0.57 - 1.00 mg/dL   eGFR 879 >40 fO/fpw/8.26   BUN/Creatinine Ratio 13 9 - 23   Sodium 138 134 - 144 mmol/L   Potassium 4.1 3.5 - 5.2 mmol/L   Chloride 100 96 - 106 mmol/L  CO2 24 20 - 29 mmol/L   Calcium 9.0 8.7 - 10.2 mg/dL   Phosphorus 2.8 (L) 3.0 - 4.3 mg/dL   Albumin 4.2 3.9 - 4.9 g/dL  Parathyroid hormone, intact (no Ca)   Collection Time: 12/20/23  8:17 AM  Result Value Ref Range   PTH 82 (H) 15 - 65 pg/mL  Urinalysis, Routine w reflex microscopic   Collection Time: 12/20/23  8:17 AM  Result Value Ref Range   Specific Gravity, UA 1.025 1.005 - 1.030   pH, UA 6.0 5.0 - 7.5   Color, UA Yellow Yellow   Appearance Ur Clear Clear   Leukocytes,UA Negative Negative   Protein,UA Negative Negative/Trace   Glucose, UA Negative Negative   Ketones, UA Negative Negative   RBC, UA Negative Negative   Bilirubin, UA Negative Negative   Urobilinogen, Ur 1.0 0.2 - 1.0 mg/dL   Nitrite, UA Negative Negative   Microscopic Examination Comment      Assessment & Plan:   Assessment & Plan Acute pain of left shoulder Pain in left shoulder Acute left shoulder pain with preserved range of motion, discomfort on movement. Previous physical therapy ineffective. Prefers buttock steroid injection. - Administer steroid injection in the buttock. - Prescribe hydrocodone  as needed. Orders:   triamcinolone  acetonide (KENALOG -40) injection 80 mg   HYDROcodone -acetaminophen  (NORCO/VICODIN) 5-325 MG tablet; Take 1 tablet by mouth every 6 (six) hours as needed.  Chronic bilateral low back pain without sciatica Low back pain Chronic low back pain worsened by muscle  loss from weight loss. Orthopedic referral in place. Prednisone  available for flare-ups. - Continue with orthopedic referral in December. - Use prednisone  as needed for flare-ups.    Anxiety with depression Depression Recent exacerbation of depressive symptoms, possibly stress-related. Recent episode improved.      Follow-up: Return if symptoms worsen or fail to improve.  An After Visit Summary was printed and given to the patient.  Harrie Cedar, FNP [facility] [phone]

## 2023-12-29 NOTE — Assessment & Plan Note (Signed)
 Depression Recent exacerbation of depressive symptoms, possibly stress-related. Recent episode improved.

## 2024-01-01 ENCOUNTER — Ambulatory Visit: Admitting: Physician Assistant

## 2024-01-02 ENCOUNTER — Other Ambulatory Visit: Payer: Self-pay | Admitting: Family Medicine

## 2024-01-04 ENCOUNTER — Other Ambulatory Visit (HOSPITAL_BASED_OUTPATIENT_CLINIC_OR_DEPARTMENT_OTHER): Payer: Self-pay

## 2024-01-04 ENCOUNTER — Ambulatory Visit (HOSPITAL_BASED_OUTPATIENT_CLINIC_OR_DEPARTMENT_OTHER): Payer: Self-pay | Admitting: Family Medicine

## 2024-01-04 ENCOUNTER — Ambulatory Visit (HOSPITAL_BASED_OUTPATIENT_CLINIC_OR_DEPARTMENT_OTHER): Admit: 2024-01-04 | Discharge: 2024-01-04 | Disposition: A | Admitting: Radiology

## 2024-01-04 ENCOUNTER — Encounter (HOSPITAL_BASED_OUTPATIENT_CLINIC_OR_DEPARTMENT_OTHER): Payer: Self-pay

## 2024-01-04 ENCOUNTER — Ambulatory Visit (HOSPITAL_BASED_OUTPATIENT_CLINIC_OR_DEPARTMENT_OTHER)
Admission: EM | Admit: 2024-01-04 | Discharge: 2024-01-04 | Disposition: A | Discharge status: Home / Self Care | Attending: Family Medicine | Admitting: Family Medicine

## 2024-01-04 DIAGNOSIS — M549 Dorsalgia, unspecified: Secondary | ICD-10-CM

## 2024-01-04 DIAGNOSIS — W010XXA Fall on same level from slipping, tripping and stumbling without subsequent striking against object, initial encounter: Secondary | ICD-10-CM

## 2024-01-04 DIAGNOSIS — M545 Low back pain, unspecified: Secondary | ICD-10-CM | POA: Diagnosis not present

## 2024-01-04 DIAGNOSIS — M533 Sacrococcygeal disorders, not elsewhere classified: Secondary | ICD-10-CM

## 2024-01-04 DIAGNOSIS — M25512 Pain in left shoulder: Secondary | ICD-10-CM | POA: Diagnosis not present

## 2024-01-04 DIAGNOSIS — B370 Candidal stomatitis: Secondary | ICD-10-CM | POA: Diagnosis not present

## 2024-01-04 MED ORDER — CYCLOBENZAPRINE HCL 5 MG PO TABS
5.0000 mg | ORAL_TABLET | Freq: Every evening | ORAL | 0 refills | Status: AC | PRN
  Filled 2024-01-04: qty 15, 15d supply, fill #0

## 2024-01-04 MED ORDER — FLUCONAZOLE 100 MG PO TABS
ORAL_TABLET | ORAL | 1 refills | Status: DC
  Filled 2024-01-04: qty 7, 7d supply, fill #0

## 2024-01-04 MED ORDER — DICLOFENAC SODIUM 75 MG PO TBEC
75.0000 mg | DELAYED_RELEASE_TABLET | Freq: Two times a day (BID) | ORAL | 0 refills | Status: DC | PRN
  Filled 2024-01-04: qty 30, 15d supply, fill #0

## 2024-01-04 NOTE — Discharge Instructions (Signed)
 Left shoulder pain, left upper back pain, lower back pain and tailbone pain: X-rays are negative.  Cyclobenzaprine , 5 mg, nightly for muscle spasms.  Do not use and drive.  Diclofenac, 75 mg, 1 pill twice daily with food for shoulder or back pain or tailbone pain.  Encouraged use of ice therapy (ice packs 30 minutes on and 30 minutes off and repeat as often as needed) for 24 to 48 hours.  Then switch to heat therapy (heat packs 30 minutes on and 30 minutes off and repeat as often as needed).   Oral thrush: Use fluconazole , 100 mg, 1 pill daily for a week.  If any symptoms persist a week after the medicine has completed may repeat once.  If symptoms persist needs to see primary care.  Will need referral to ENT for further workup.  If back shoulder or tailbone pain persist, needs to see primary care and get a referral to orthopedics or for physical therapy.  Follow-up here as needed.

## 2024-01-04 NOTE — ED Provider Notes (Addendum)
 [facility]    CSN: [id number] Arrival date & time: 01/04/24  0841      History   Chief Complaint Chief Complaint  Patient presents with   Fall   Tailbone Pain   Shoulder Pain    HPI Michelle Lamb is a 33 y.o. female.   33 year old female who was in the process of sitting down on a chair and the chair moved and she fell onto the floor and hit her tailbone hard.  This occurred on 01/03/2024 midday.  It caused lower back pain and pain that shot all the way up her back.  She took some acetaminophen  and a muscle relaxer and had some relief of pain.  Patient had a left shoulder injury.  She was helping a patient move at work and the patient pushed down on her left shoulder to try to stand up and the patient's had left shoulder pain ever since.  This happened on approximately 12/27/2023 or 12/28/2023.  The patient saw primary care on 12/29/2023 and was given a Kenalog  injection.  She has had some improvement in her left upper back and left shoulder pain but not significant improvement.  The patient has had a coated sore tongue since prior to 12/19/2023.  She was seen by primary care on 12/19/2023 and had quite a bit of blood work and everything came up normal.  She was treated with nystatin mouthwash for oral thrush.  It helped some but it did not fully resolve the symptoms.  The patient has had a Kenalog  shot on 12/29/2023.   Fall Pertinent negatives include no chest pain, no abdominal pain and no shortness of breath.  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   Blood transfusion without reported diagnosis 2018   postpartum   Chest pain of uncertain etiology 06/08/2022   06/15/22 Myocardial perfusion - normal, 09/15/22 long term heart monitor in Epic   Chronic kidney disease    right kidney at 35%   Complication of anesthesia    Slow to wake up   Constipation 06/28/2022   Diabetes mellitus without complication (HCC)    NOT AN ISSUE SINCE GASTRIC BYPASS IN 2022. 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  Report date is 02/23/2022.      The CancerNext-Expanded gene panel offered by [facility] and includes sequencing, rearrangement, and RNA analysis for the following 77 genes: AIP, ALK, APC, ATM, AXIN2, BAP1, BARD1, BLM, BMPR1A, BRCA1, BRCA2, BRIP1, CDC73, CDH1, CDK4, CDKN1B, CDKN2A, CHEK2, CTNNA1, DICER1, FANCC, FH, FLCN, GALNT12, KIF1B, LZTR1,   Hepatic steatosis 09/08/2021   History of herpes simplex infection    History of partial nephrectomy 03/21/2022   Formatting of this note might be different from the original. 03/15/2021: Underwent right partial nephrectomy by Dr. Renda for right renal  neoplasm. Pathology: Angiomyolipoma. Formatting of this note might be different from the original. 03/15/2021: Underwent right partial nephrectomy by Dr. Renda for right renal neoplasm. Pathology: Angiomyolipoma.   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   Morbid obesity (HCC) 06/02/2020   Neoplasm of right kidney    benign tumor of kidney   Obesity (BMI 30.0-34.9) 06/08/2022   Obstructive sleep apnea 10/29/2019   hx of before gastric bypass, no longer uses CPAP   Racing heart beat 08/22/2022   see 09/15/22 Long term heart monitor in Epic   Seasonal allergies    Sleep apnea    Ureteral obstruction, right 07/15/2021   Vaginal delivery 2009, 2010, 2015   Vitamin D  insufficiency 09/30/2020   Weight loss 09/30/2020    Patient Active Problem List   Diagnosis Date Noted   Right lower quadrant abdominal pain 11/17/2023   Post op infection 09/29/2023   Acute pain of left shoulder 07/13/2023   Chronic bilateral low back pain without sciatica 05/29/2023   Attention or concentration deficit 05/29/2023   Hypoglycemia 05/29/2023   Decreased lung sounds 04/30/2023   Acute nonintractable headache 04/21/2023   COVID-19 04/21/2023   Low back pain 04/04/2023   Thoracic back pain 04/04/2023   Other fatigue 03/28/2023   Influenza B 03/22/2023   Acute bilateral low back pain without sciatica 02/18/2023   Depression with anxiety 10/19/2022   Menorrhagia with irregular cycle 10/19/2022   Dizziness 08/22/2022   Racing heart beat 08/22/2022   Abnormal menses 08/22/2022   Constipation 06/28/2022   Chest pain of uncertain etiology 06/08/2022   Obesity (BMI 30.0-34.9) 06/08/2022   Anxiety 06/06/2022   Chronic kidney disease 06/06/2022   Complication of anesthesia 06/06/2022   Diabetes mellitus without complication (HCC) 06/06/2022   Fatty liver 06/06/2022   Hyperlipidemia 06/06/2022   Hypertension 06/06/2022   Seasonal allergies 06/06/2022   Vaginal delivery 06/06/2022   Vitamin D  deficiency 06/06/2022   History of partial nephrectomy 03/21/2022   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   Genetic testing 03/03/2022   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   Viral disease 03/02/2022   Annual physical exam  11/30/2021   Absolute anemia 10/12/2021   Iron  deficiency anemia due to chronic blood loss 09/14/2021   Hepatic steatosis 09/08/2021   Acute laryngopharyngitis 09/08/2021   Gastroesophageal reflux disease without esophagitis 09/08/2021   Attention deficit hyperactivity disorder (ADHD), 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   Ureteral obstruction, right 07/15/2021   Neoplasm of right kidney 03/15/2021   LUQ abdominal pain 12/24/2020   Dehydration 12/24/2020   Acute cystitis with hematuria 12/22/2020   Malaise 12/22/2020   Influenza A 12/14/2020   Vitamin D  insufficiency 09/30/2020   Weight loss 09/30/2020   Mixed dyslipidemia 09/30/2020   Mixed hyperlipidemia 09/30/2020   Anxiety with depression 09/23/2020   Morbid obesity (HCC) 06/02/2020   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   Asthma 05/22/2020   Obstructive sleep apnea 10/29/2019   Hypertension, benign essential, goal below 140/90 06/20/2019   Type 2 diabetes mellitus with obesity 06/20/2019   Blood transfusion without reported diagnosis 2018   BMI 50.0-59.9, adult (HCC)  12/28/2015   History of herpes simplex infection (serology only) 12/28/2015    Past Surgical History:  Procedure Laterality Date   ABDOMINAL HYSTERECTOMY  2024   APPENDECTOMY     CESAREAN SECTION  09/29/2016   CHOLECYSTECTOMY     COLONOSCOPY WITH ESOPHAGOGASTRODUODENOSCOPY (EGD)  07/22/2022   CYSTOSCOPY N/A 12/27/2022   Procedure: CYSTOSCOPY;  Surgeon: Glennon Almarie POUR, MD;  Location: [facility];  Service: Gynecology;  Laterality: N/A;   CYSTOSCOPY W/ URETERAL STENT PLACEMENT Right 07/15/2021   Procedure: CYSTOSCOPY WITH RETROGRADE PYELOGRAM/URETERAL STENT PLACEMENT;  Surgeon: Renda Glance, MD;  Location: [facility];  Service: Urology;  Laterality: Right;   CYSTOSCOPY WITH RETROGRADE PYELOGRAM, URETEROSCOPY AND STENT PLACEMENT Right 05/03/2021   Procedure: CYSTOSCOPY WITH RIGHT RETROGRADE PYELOGRAM, URETEROSCOPY;  Surgeon:  Renda Glance, MD;  Location: [facility];  Service: Urology;  Laterality: Right;   DILATION AND CURETTAGE OF UTERUS N/A 12/31/2015   Procedure: SUCTION DILATATION AND CURETTAGE;  Surgeon: Bebe Furry, MD;  Location: [facility];  Service: Gynecology;  Laterality: N/A;   DILATION AND EVACUATION N/A 01/01/2016   Procedure: DILATATION AND EVACUATION;  Surgeon: Norleen LULLA Server, MD;  Location: [facility];  Service: Gynecology;  Laterality: N/A;   ESOPHAGOGASTRODUODENOSCOPY     GASTRIC BYPASS  06/02/2020   IR NEPHRO TUBE REMOV/FL  07/16/2021   IR NEPHROSTOMY EXCHANGE RIGHT  05/12/2021   IR NEPHROSTOMY PLACEMENT RIGHT  05/05/2021   PANNICULECTOMY N/A 08/25/2023   Procedure: PANNICULECTOMY;  Surgeon: Waddell Leonce NOVAK, MD;  Location: [facility];  Service: Plastics;  Laterality: N/A;   ROBOT ASSISTED PYELOPLASTY Right 07/15/2021   Procedure: XI ROBOTIC ASSISTED  LAPAROSCOPIC PYELOPLASTY;  Surgeon: Renda Glance, MD;  Location: [facility];  Service: Urology;  Laterality: Right;   ROBOTIC ASSISTED LAPAROSCOPIC HYSTERECTOMY AND SALPINGECTOMY Bilateral 12/27/2022   Procedure: XI ROBOTIC ASSISTED LAPAROSCOPIC HYSTERECTOMY AND SALPINGECTOMY;  Surgeon: Glennon Almarie POUR, MD;  Location: [facility];  Service: Gynecology;  Laterality: Bilateral;   ROBOTIC ASSITED PARTIAL NEPHRECTOMY Right 03/15/2021   Procedure: XI ROBOTIC ASSITED PARTIAL NEPHRECTOMY;  Surgeon: Renda Glance, MD;  Location: [facility];  Service: Urology;  Laterality: Right;   TONSILLECTOMY     TUBAL LIGATION     UPPER GASTROINTESTINAL ENDOSCOPY     w/dilation    OB History     Gravida  6   Para  4   Term  3   Preterm  1   AB  2   Living  4      SAB  2   IAB      Ectopic      Multiple      Live Births  4        Obstetric Comments  H/o GDM and HTN in her last pregnancy          Home Medications    Prior to Admission medications   Medication Sig Start Date End Date Taking? Authorizing  Provider  cyclobenzaprine  (FLEXERIL ) 5 MG tablet Take 1 tablet (5 mg total) by mouth at bedtime as needed for up to 15 days for muscle spasms. 01/04/24 01/19/24 Yes Ival Domino, FNP  fluconazole  (DIFLUCAN ) 100 MG tablet Take one tablet daily for 7 days.  Wait one week.  If any symptoms of thrush remain, refill the medication and take daily for one more week. 01/04/24  Yes Ival Domino, FNP  albuterol  (VENTOLIN  HFA) 108 (90 Base) MCG/ACT inhaler Inhale 2 puffs into the lungs every 6 (six) hours as  needed for wheezing or shortness of breath. 09/08/21   Nicholaus Credit, PA-C  amphetamine-dextroamphetamine (ADDERALL) 10 MG tablet Take 1 tablet (10 mg total) by mouth 2 (two) times daily with a meal. 12/21/23   Nicholaus Credit, PA-C  Continuous Glucose Sensor (FREESTYLE LIBRE 3 SENSOR) MISC Use every 14 days - apply as directed 05/10/23   Nicholaus Credit, PA-C  cyanocobalamin  (VITAMIN B12) 1000 MCG/ML injection Inject 1 mL (1,000 mcg total) into the muscle every 30 (thirty) days. 12/20/23 03/19/24  Nicholaus Credit, PA-C  fluticasone  (FLONASE ) 50 MCG/ACT nasal spray Place 2 sprays into both nostrils daily. Patient taking differently: Place 2 sprays into both nostrils daily as needed for allergies or rhinitis. 03/21/22   Charlene Clotilda PARAS, NP  HYDROcodone -acetaminophen  (NORCO/VICODIN) 5-325 MG tablet Take 1 tablet by mouth every 6 (six) hours as needed. 12/29/23   Teressa Harrie HERO, FNP  linaclotide  (LINZESS ) 290 MCG CAPS capsule Take one capsule on an empty stomach 30 minutes prior to first meal of the day. Keep medicine in its original container. 07/21/23     LORazepam  (ATIVAN ) 1 MG tablet Take 1 tablet (1 mg total) by mouth daily as needed. 09/19/23   Nicholaus Credit, PA-C  magic mouthwash (nystatin, diphenhydrAMINE , alum & mag hydroxide) suspension mixture Swish and spit 10 mLs 3 (three) times daily. 12/19/23   Nicholaus Credit, PA-C  meclizine  (ANTIVERT ) 25 MG tablet Take 1 tablet (25 mg total) by mouth 3 (three) times daily as needed for  dizziness. 12/07/22   Nicholaus Credit, PA-C  methocarbamol  (ROBAXIN ) 500 MG tablet Take 1 tablet (500 mg total) by mouth 3 (three) times daily. 11/17/23   Nicholaus Credit, PA-C  Multiple Vitamin (MULTIVITAMIN WITH MINERALS) TABS tablet Take 1 tablet by mouth daily.    [provider]  ondansetron  (ZOFRAN -ODT) 4 MG disintegrating tablet Take 1 tablet (4 mg total) by mouth every 8 (eight) hours as needed for nausea or vomiting. 08/09/23   Landy Honora CROME, PA-C  pantoprazole  (PROTONIX ) 40 MG tablet Take 1 tablet (40 mg total) by mouth daily. 03/30/23   Nicholaus Credit, PA-C  Sodium Fluoride  (SODIUM FLUORIDE  5000 PPM) 1.1 % PSTE Apply 1 application to the teeth twice a day; . Floss first, then brush 2 times daily with PreviDent toothpaste. Expectorate. Do not rinse, use mouth rinse, eat, or drink for 30 min after use. 09/07/23     venlafaxine  XR (EFFEXOR  XR) 150 MG 24 hr capsule Take 1 capsule (150 mg total) by mouth daily with breakfast. 11/10/23   Cox, Kirsten, MD  Vitamin D , Ergocalciferol , (DRISDOL ) 1.25 MG (50000 UNIT) CAPS capsule Take 1 capsule (50,000 Units total) by mouth 2 (two) times a week. 10/09/23   Nicholaus Credit, PA-C    Family History Family History  Problem Relation Age of Onset   Hypertension Mother    Thyroid  disease Mother    Cervical cancer Mother        dx 103s   Depression Mother    Diabetes Mother    Hyperlipidemia Mother    Anxiety disorder Mother    Asthma Father    Stomach cancer Maternal Grandmother        dx 52s   Leukemia Maternal Grandfather        d. 65s   Depression Paternal Grandmother    Diabetes Paternal Grandmother    Cancer Paternal Grandfather        dx after 28; mets   Breast cancer Maternal Great-grandmother        dx <50;  mets; MGF's mother   Stomach cancer Other        MGM's brother; dx after 32   Cancer Other        MGF's sisters x3; unknown primary   ADD / ADHD Daughter    Colon cancer Neg Hx    Colon polyps Neg Hx    Esophageal cancer Neg Hx     Rectal cancer Neg Hx     Social History Social History   Tobacco Use   Smoking status: Never   Smokeless tobacco: Never  Vaping Use   Vaping status: Never Used  Substance Use Topics   Alcohol use: Yes    Comment: occasionally   Drug use: Never     Allergies   Bactrim  [sulfamethoxazole -trimethoprim ], Dilaudid  [hydromorphone ], and Sulfa  antibiotics   Review of Systems Review of Systems  Constitutional:  Negative for chills and fever.  HENT:  Positive for mouth sores (Patient has a coated sore tongue.). Negative for ear pain and sore throat.   Eyes:  Negative for pain and visual disturbance.  Respiratory:  Negative for cough and shortness of breath.   Cardiovascular:  Negative for chest pain and palpitations.  Gastrointestinal:  Negative for abdominal pain, constipation, diarrhea, nausea and vomiting.  Genitourinary:  Negative for dysuria and hematuria.  Musculoskeletal:  Positive for arthralgias (Left shoulder pain.) and back pain (Left upper back and lower lumbar back and sacral pain.).  Skin:  Negative for color change and rash.  Neurological:  Negative for seizures and syncope.  All other systems reviewed and are negative.    Physical Exam Triage Vital Signs ED Triage Vitals  Encounter Vitals Group     BP 01/04/24 0903 129/88     Girls Systolic BP Percentile --      Girls Diastolic BP Percentile --      Boys Systolic BP Percentile --      Boys Diastolic BP Percentile --      Pulse Rate 01/04/24 0903 76     Resp 01/04/24 0903 20     Temp 01/04/24 0903 98.5 F (36.9 C)     Temp Source 01/04/24 0903 Oral     SpO2 01/04/24 0903 97 %     Weight --      Height --      Head Circumference --      Peak Flow --      Pain Score 01/04/24 0900 7     Pain Loc --      Pain Education --      Exclude from Growth Chart --    No data found.  Updated Vital Signs BP 129/88 (BP Location: Right Arm)   Pulse 76   Temp 98.5 F (36.9 C) (Oral)   Resp 20   LMP 12/04/2022  (Exact Date)   SpO2 97%   Visual Acuity Right Eye Distance:   Left Eye Distance:   Bilateral Distance:    Right Eye Near:   Left Eye Near:    Bilateral Near:     Physical Exam Vitals and nursing note reviewed.  Constitutional:      General: She is not in acute distress.    Appearance: She is well-developed. She is not ill-appearing, toxic-appearing or diaphoretic.  HENT:     Head: Normocephalic and atraumatic.     Right Ear: Hearing, tympanic membrane, ear canal and external ear normal.     Left Ear: Hearing, tympanic membrane, ear canal and external ear normal.     Nose:  No congestion or rhinorrhea.     Right Sinus: No maxillary sinus tenderness or frontal sinus tenderness.     Left Sinus: No maxillary sinus tenderness or frontal sinus tenderness.     Mouth/Throat:     Lips: Pink.     Mouth: Mucous membranes are moist.     Tongue: Lesions (White-yellow plaque mostly on the middle and back of the tongue that does not remove with scraping.) present.     Pharynx: Uvula midline. No oropharyngeal exudate or posterior oropharyngeal erythema.     Tonsils: No tonsillar exudate.  Eyes:     Conjunctiva/sclera: Conjunctivae normal.     Pupils: Pupils are equal, round, and reactive to light.  Cardiovascular:     Rate and Rhythm: Normal rate and regular rhythm.     Heart sounds: S1 normal and S2 normal. No murmur heard. Pulmonary:     Effort: Pulmonary effort is normal. No respiratory distress.     Breath sounds: Normal breath sounds. No decreased breath sounds, wheezing, rhonchi or rales.  Abdominal:     General: Bowel sounds are normal.     Palpations: Abdomen is soft.     Tenderness: There is no abdominal tenderness.  Musculoskeletal:        General: No swelling.     Right shoulder: Normal.     Left shoulder: Tenderness (Left anterior and posterior upper shoulder and left deltoid) and bony tenderness present. No swelling, deformity, effusion, laceration or crepitus. Normal range  of motion. Normal strength. Normal pulse.     Right upper arm: Normal.     Left upper arm: Normal.     Right elbow: Normal.     Left elbow: Normal.     Right forearm: Normal.     Left forearm: Normal.     Right wrist: Normal.     Left wrist: Normal.     Right hand: Normal.     Left hand: Normal.     Cervical back: Normal and neck supple.     Thoracic back: Normal.     Lumbar back: Spasms, tenderness and bony tenderness present. No swelling, edema, deformity, signs of trauma or lacerations. Normal range of motion. No scoliosis.  Lymphadenopathy:     Head:     Right side of head: No submental, submandibular, tonsillar, preauricular or posterior auricular adenopathy.     Left side of head: No submental, submandibular, tonsillar, preauricular or posterior auricular adenopathy.     Cervical: No cervical adenopathy.     Right cervical: No superficial cervical adenopathy.    Left cervical: No superficial cervical adenopathy.  Skin:    General: Skin is warm.     Capillary Refill: Capillary refill takes less than 2 seconds.     Findings: No rash.  Neurological:     Mental Status: She is alert and oriented to person, place, and time.  Psychiatric:        Mood and Affect: Mood normal.      UC Treatments / Results  Labs (all labs ordered are listed, but only abnormal results are displayed) Comprehensive metabolic panel with GFR: 10/11/23:     Component Ref Range & Units (hover) 2 mo ago (10/11/23)  Glucose 74  BUN 13  Creatinine, Ser 0.59  eGFR 121  BUN/Creatinine Ratio 22  Sodium 139  Potassium 4.2  Chloride 99  CO2 23  Calcium 9.7  Total Protein 7.1  Albumin 4.8  Globulin, Total 2.3  Bilirubin Total 0.5  Alkaline Phosphatase 114  AST 21  ALT 27  Resulting Agency [id number]      EKG   Radiology No results found.  Procedures Procedures (including critical care time)  Medications Ordered in UC Medications - No data to display  Initial Impression / Assessment  and Plan / UC Course  I have reviewed the triage vital signs and the nursing notes.  Pertinent labs & imaging results that were available during my care of the patient were reviewed by me and considered in my medical decision making (see chart for details).  Plan of Care: Left shoulder pain, left upper back pain, lower back pain and tailbone pain: X-rays are negative.  Cyclobenzaprine , 5 mg, nightly for muscle spasms.  Do not use and drive.  Diclofenac was prescribed but it was canceled.  The patient reports she has a damaged left kidney.  Although her renal function test are good she sees nephrology and they do not want her taking any NSAIDs.  She will use acetaminophen  if needed for pain.  Oral thrush: Use fluconazole , 100 mg, 1 pill daily for a week.  If any symptoms persist a week after the medicine has completed may repeat once.  If symptoms persist needs to see primary care.  Will need referral to ENT for further workup.  If back shoulder or tailbone pain persist, needs to see primary care and get a referral to orthopedics or for physical therapy.  Follow-up here as needed.  See discharge instructions for additional patient education and instructions.  I reviewed the plan of care with the patient and/or the patient's guardian.  The patient and/or guardian had time to ask questions and acknowledged that the questions were answered.  I provided instruction on symptoms or reasons to return here or to go to an ER, if symptoms/condition did not improve, worsened or if new symptoms occurred.  Final Clinical Impressions(s) / UC Diagnoses   Final diagnoses:  Sacral back pain  Fall on same level from slipping, tripping or stumbling, initial encounter  Oral thrush  Acute pain of left shoulder  Upper back pain on left side  Acute midline low back pain without sciatica     Discharge Instructions      Left shoulder pain, left upper back pain, lower back pain and tailbone pain: X-rays are  negative.  Cyclobenzaprine , 5 mg, nightly for muscle spasms.  Do not use and drive.  Diclofenac, 75 mg, 1 pill twice daily with food for shoulder or back pain or tailbone pain.  Encouraged use of ice therapy (ice packs 30 minutes on and 30 minutes off and repeat as often as needed) for 24 to 48 hours.  Then switch to heat therapy (heat packs 30 minutes on and 30 minutes off and repeat as often as needed).   Oral thrush: Use fluconazole , 100 mg, 1 pill daily for a week.  If any symptoms persist a week after the medicine has completed may repeat once.  If symptoms persist needs to see primary care.  Will need referral to ENT for further workup.  If back shoulder or tailbone pain persist, needs to see primary care and get a referral to orthopedics or for physical therapy.  Follow-up here as needed.     ED Prescriptions     Medication Sig Dispense Auth. Provider   fluconazole  (DIFLUCAN ) 100 MG tablet Take one tablet daily for 7 days.  Wait one week.  If any symptoms of thrush remain, refill the medication and take daily for one more week. 7  tablet Ival Domino, FNP   cyclobenzaprine  (FLEXERIL ) 5 MG tablet Take 1 tablet (5 mg total) by mouth at bedtime as needed for up to 15 days for muscle spasms. 15 tablet Esmay Amspacher, FNP   diclofenac (VOLTAREN) 75 MG EC tablet  (Status: Discontinued) Take 1 tablet (75 mg total) by mouth every 12 (twelve) hours as needed for up to 15 days (take with food for back or other pain). 30 tablet Doren Kaspar, FNP      PDMP not reviewed this encounter.   Ival Domino, FNP 01/04/24 1045    Ival Domino, FNP 01/04/24 1055

## 2024-01-04 NOTE — Progress Notes (Signed)
 X-rays were negative as patient was told during her visit.  Patient updated via telephone call.

## 2024-01-04 NOTE — ED Triage Notes (Signed)
 Pt states yesterday she fell out of her chair and fell directly onto her tail bone. After the fall she had pain radiating all the way up her back but today the pain is located in the tailbone/lower back. Pain is described as a constant ache with occasional sharp pain. She took a muscle relaxer last night and tylenol  with morning with slight relief.   She is also having left shoulder pain for the last week. She attempted to help a pt up and the pt instead pushed down on her shoulder to get out of the chair. Since then she has kept having the pain. Pain is described as ache/stiffness.

## 2024-01-18 ENCOUNTER — Ambulatory Visit: Admitting: Physician Assistant

## 2024-01-18 ENCOUNTER — Encounter: Payer: Self-pay | Admitting: Physician Assistant

## 2024-01-18 ENCOUNTER — Other Ambulatory Visit (HOSPITAL_BASED_OUTPATIENT_CLINIC_OR_DEPARTMENT_OTHER): Payer: Self-pay

## 2024-01-18 VITALS — BP 132/88 | HR 84 | Temp 97.2°F | Ht 63.0 in | Wt 198.4 lb

## 2024-01-18 DIAGNOSIS — J060 Acute laryngopharyngitis: Secondary | ICD-10-CM

## 2024-01-18 LAB — POCT INFLUENZA A/B
Influenza A, POC: NEGATIVE
Influenza B, POC: NEGATIVE

## 2024-01-18 LAB — POC COVID19 BINAXNOW: SARS Coronavirus 2 Ag: NEGATIVE

## 2024-01-18 MED ORDER — HYDROCODONE BIT-HOMATROP MBR 5-1.5 MG/5ML PO SOLN
5.0000 mL | Freq: Four times a day (QID) | ORAL | 0 refills | Status: DC | PRN
  Filled 2024-01-18: qty 120, 6d supply, fill #0

## 2024-01-18 MED ORDER — AZITHROMYCIN 250 MG PO TABS
ORAL_TABLET | ORAL | 0 refills | Status: AC
  Filled 2024-01-18: qty 6, 5d supply, fill #0

## 2024-01-18 NOTE — Progress Notes (Signed)
 Acute Office Visit  Subjective:    Patient ID: Michelle Lamb, female    DOB: [birth year], 33 y.o.   MRN: [id number]  Chief Complaint  Patient presents with   Sick    HPI: Patient is in today for complaints of sinus pressure , headache behind eyes and top of head, slight cough and malaise but no fever Denies chest pain or dyspnea.  Has had some bodyaches and diarrhea.   Some lower abdominal cramps - no nausea or vomiting   Current Outpatient Medications:    albuterol  (VENTOLIN  HFA) 108 (90 Base) MCG/ACT inhaler, Inhale 2 puffs into the lungs every 6 (six) hours as needed for wheezing or shortness of breath., Disp: 1 each, Rfl: 5   amphetamine -dextroamphetamine  (ADDERALL) 10 MG tablet, Take 1 tablet (10 mg total) by mouth 2 (two) times daily with a meal., Disp: 60 tablet, Rfl: 0   Continuous Glucose Sensor (FREESTYLE LIBRE 3 SENSOR) MISC, Use every 14 days - apply as directed, Disp: 2 each, Rfl: 5   cyanocobalamin  (VITAMIN B12) 1000 MCG/ML injection, Inject 1 mL (1,000 mcg total) into the muscle every 30 (thirty) days., Disp: 1 mL, Rfl: 2   cyclobenzaprine  (FLEXERIL ) 5 MG tablet, Take 1 tablet (5 mg total) by mouth at bedtime as needed for up to 15 days for muscle spasms., Disp: 15 tablet, Rfl: 0   fluconazole  (DIFLUCAN ) 100 MG tablet, Take one tablet daily for 7 days.  Wait one week.  If any symptoms of thrush remain, refill the medication and take daily for one more week., Disp: 7 tablet, Rfl: 1   fluticasone  (FLONASE ) 50 MCG/ACT nasal spray, Place 2 sprays into both nostrils daily. (Patient taking differently: Place 2 sprays into both nostrils daily as needed for allergies or rhinitis.), Disp: 16 g, Rfl: 6   HYDROcodone -acetaminophen  (NORCO/VICODIN) 5-325 MG tablet, Take 1 tablet by mouth every 6 (six) hours as needed., Disp: 20 tablet, Rfl: 0   linaclotide  (LINZESS ) 290 MCG CAPS capsule, Take one capsule on an empty stomach 30 minutes prior to first meal of the day. Keep medicine  in its original container., Disp: 90 capsule, Rfl: 4   LORazepam  (ATIVAN ) 1 MG tablet, Take 1 tablet (1 mg total) by mouth daily as needed., Disp: 30 tablet, Rfl: 1   magic mouthwash (nystatin , diphenhydrAMINE , alum & mag hydroxide) suspension mixture, Swish and spit 10 mLs 3 (three) times daily., Disp: 240 mL, Rfl: 0   meclizine  (ANTIVERT ) 25 MG tablet, Take 1 tablet (25 mg total) by mouth 3 (three) times daily as needed for dizziness., Disp: 30 tablet, Rfl: 0   methocarbamol  (ROBAXIN ) 500 MG tablet, Take 1 tablet (500 mg total) by mouth 3 (three) times daily., Disp: 30 tablet, Rfl: 0   Multiple Vitamin (MULTIVITAMIN WITH MINERALS) TABS tablet, Take 1 tablet by mouth daily., Disp: , Rfl:    ondansetron  (ZOFRAN -ODT) 4 MG disintegrating tablet, Take 1 tablet (4 mg total) by mouth every 8 (eight) hours as needed for nausea or vomiting., Disp: 20 tablet, Rfl: 0   pantoprazole  (PROTONIX ) 40 MG tablet, Take 1 tablet (40 mg total) by mouth daily., Disp: 30 tablet, Rfl: 11   Sodium Fluoride  (SODIUM FLUORIDE  5000 PPM) 1.1 % PSTE, Apply 1 application to the teeth twice a day; . Floss first, then brush 2 times daily with PreviDent toothpaste. Expectorate. Do not rinse, use mouth rinse, eat, or drink for 30 min after use., Disp: 100 mL, Rfl: 5   venlafaxine  XR (EFFEXOR  XR) 150 MG  24 hr capsule, Take 1 capsule (150 mg total) by mouth daily with breakfast., Disp: 90 capsule, Rfl: 0   Vitamin D , Ergocalciferol , (DRISDOL ) 1.25 MG (50000 UNIT) CAPS capsule, Take 1 capsule (50,000 Units total) by mouth 2 (two) times a week., Disp: 8 capsule, Rfl: 3  Allergies  Allergen Reactions   Bactrim  [Sulfamethoxazole -Trimethoprim ] Hives   Dilaudid  [Hydromorphone ] Rash   Sulfa  Antibiotics Rash    ROS CONSTITUTIONAL: malaise E/N/T: malaise CARDIOVASCULAR: Negative for chest pain, dizziness, palpitations  RESPIRATORY: slight cough GASTROINTESTINAL: see HPI INTEGUMENTARY: Negative for rash.       Objective:     PHYSICAL EXAM:   BP 132/88 (BP Location: Left Arm, Patient Position: Sitting)   Pulse 84   Temp (!) 97.2 F (36.2 C) (Temporal)   Ht 5' 3 (1.6 m)   Wt 198 lb 6.4 oz (90 kg)   LMP 12/04/2022 (Exact Date)   SpO2 99%   BMI 35.14 kg/m    GEN: Well nourished, well developed, in no acute distress  HEENT: normal external ears and nose - - normal nasal mucosa and septum - Lips, Teeth and Gums - normal  Oropharynx - slight erythema and uvula slightly swollen --- has maxillary sinus tenderness Cardiac: RRR; no murmurs,  Respiratory:  normal respiratory rate and pattern with no distress - normal breath sounds with no rales, rhonchi, wheezes or rubs  Office Visit on 01/18/2024  Component Date Value Ref Range Status   SARS Coronavirus 2 Ag 01/18/2024 Negative  Negative Final   Influenza A, POC 01/18/2024 Negative  Negative Final   Influenza B, POC 01/18/2024 Negative  Negative Final        Assessment & Plan:    Acute laryngopharyngitis -     POC COVID-19 BinaxNow -     POCT Influenza A/B     Follow-up: No follow-ups on file.  An After Visit Summary was printed and given to the patient.  CAMIE JONELLE NICHOLAUS DEVONNA [facility] [phone]

## 2024-01-22 ENCOUNTER — Other Ambulatory Visit: Payer: Self-pay | Admitting: Physician Assistant

## 2024-01-22 ENCOUNTER — Other Ambulatory Visit (HOSPITAL_BASED_OUTPATIENT_CLINIC_OR_DEPARTMENT_OTHER): Payer: Self-pay

## 2024-01-22 DIAGNOSIS — R41840 Attention and concentration deficit: Secondary | ICD-10-CM

## 2024-01-22 MED ORDER — AMPHETAMINE-DEXTROAMPHETAMINE 20 MG PO TABS
20.0000 mg | ORAL_TABLET | Freq: Two times a day (BID) | ORAL | 0 refills | Status: DC
  Filled 2024-01-22: qty 60, 30d supply, fill #0

## 2024-01-22 NOTE — Telephone Encounter (Signed)
 Pt requests to increase dose

## 2024-01-22 NOTE — Telephone Encounter (Signed)
 Needs refill

## 2024-02-01 ENCOUNTER — Encounter: Payer: Self-pay | Admitting: Physician Assistant

## 2024-02-01 ENCOUNTER — Ambulatory Visit: Admitting: Physician Assistant

## 2024-02-01 ENCOUNTER — Other Ambulatory Visit (HOSPITAL_BASED_OUTPATIENT_CLINIC_OR_DEPARTMENT_OTHER): Payer: Self-pay

## 2024-02-01 VITALS — BP 134/74 | HR 90 | Temp 98.7°F | Resp 18 | Ht 63.0 in | Wt 197.0 lb

## 2024-02-01 DIAGNOSIS — S83411A Sprain of medial collateral ligament of right knee, initial encounter: Secondary | ICD-10-CM | POA: Diagnosis not present

## 2024-02-01 DIAGNOSIS — F418 Other specified anxiety disorders: Secondary | ICD-10-CM

## 2024-02-01 MED ORDER — LORAZEPAM 1 MG PO TABS
1.0000 mg | ORAL_TABLET | Freq: Every day | ORAL | 1 refills | Status: DC | PRN
  Filled 2024-02-01: qty 30, 30d supply, fill #0

## 2024-02-01 NOTE — Progress Notes (Signed)
 Acute Office Visit  Subjective:    Patient ID: Michelle Lamb, female    DOB: 1990/09/02, 33 y.o.   MRN: [id number]  Chief Complaint  Patient presents with   Knee Pain    HPI: Patient is in today for complaints of right knee pain - she states that on Saturday she got out of her car and stepped down and possibly twisted knee and felt pain medial side of knee.  She is able to walk and bear weight on knee with discomfort but walking up steps causes worsened pain and feels a 'pulling sensation' of lateral aspect of knee She has used ibuprofen and tylenol  with minimal relief - has not worn compression    Current Outpatient Medications:    albuterol  (VENTOLIN  HFA) 108 (90 Base) MCG/ACT inhaler, Inhale 2 puffs into the lungs every 6 (six) hours as needed for wheezing or shortness of breath., Disp: 1 each, Rfl: 5   amphetamine -dextroamphetamine  (ADDERALL) 20 MG tablet, Take 1 tablet (20 mg total) by mouth 2 (two) times daily., Disp: 60 tablet, Rfl: 0   Continuous Glucose Sensor (FREESTYLE LIBRE 3 SENSOR) MISC, Use every 14 days - apply as directed, Disp: 2 each, Rfl: 5   cyanocobalamin  (VITAMIN B12) 1000 MCG/ML injection, Inject 1 mL (1,000 mcg total) into the muscle every 30 (thirty) days., Disp: 1 mL, Rfl: 2   fluticasone  (FLONASE ) 50 MCG/ACT nasal spray, Place 2 sprays into both nostrils daily. (Patient taking differently: Place 2 sprays into both nostrils daily as needed for allergies or rhinitis.), Disp: 16 g, Rfl: 6   linaclotide  (LINZESS ) 290 MCG CAPS capsule, Take one capsule on an empty stomach 30 minutes prior to first meal of the day. Keep medicine in its original container., Disp: 90 capsule, Rfl: 4   meclizine  (ANTIVERT ) 25 MG tablet, Take 1 tablet (25 mg total) by mouth 3 (three) times daily as needed for dizziness., Disp: 30 tablet, Rfl: 0   Multiple Vitamin (MULTIVITAMIN WITH MINERALS) TABS tablet, Take 1 tablet by mouth daily., Disp: , Rfl:    ondansetron  (ZOFRAN -ODT) 4  MG disintegrating tablet, Take 1 tablet (4 mg total) by mouth every 8 (eight) hours as needed for nausea or vomiting., Disp: 20 tablet, Rfl: 0   pantoprazole  (PROTONIX ) 40 MG tablet, Take 1 tablet (40 mg total) by mouth daily., Disp: 30 tablet, Rfl: 11   Sodium Fluoride  (SODIUM FLUORIDE  5000 PPM) 1.1 % PSTE, Apply 1 application to the teeth twice a day; . Floss first, then brush 2 times daily with PreviDent toothpaste. Expectorate. Do not rinse, use mouth rinse, eat, or drink for 30 min after use., Disp: 100 mL, Rfl: 5   venlafaxine  XR (EFFEXOR  XR) 150 MG 24 hr capsule, Take 1 capsule (150 mg total) by mouth daily with breakfast., Disp: 90 capsule, Rfl: 0   Vitamin D , Ergocalciferol , (DRISDOL ) 1.25 MG (50000 UNIT) CAPS capsule, Take 1 capsule (50,000 Units total) by mouth 2 (two) times a week., Disp: 8 capsule, Rfl: 3   LORazepam  (ATIVAN ) 1 MG tablet, Take 1 tablet (1 mg total) by mouth daily as needed., Disp: 30 tablet, Rfl: 1  Allergies  Allergen Reactions   Bactrim  [Sulfamethoxazole -Trimethoprim ] Hives   Dilaudid  [Hydromorphone ] Rash   Sulfa  Antibiotics Rash    ROS CONSTITUTIONAL: Negative for chills, fatigue, fever,  CARDIOVASCULAR: Negative for chest pain,   MSK: see HPI INTEGUMENTARY: Negative for rash.       Objective:    PHYSICAL EXAM:   BP 134/74   Pulse  90   Temp 98.7 F (37.1 C)   Resp 18   Ht 5' 3 (1.6 m)   Wt 197 lb (89.4 kg)   LMP 12/04/2022 (Exact Date)   SpO2 99%   BMI 34.90 kg/m     GEN: Well nourished, well developed, in no acute distress   Cardiac: RRR; no murmurs, Respiratory:  normal respiratory rate and pattern with no distress - normal breath sounds with no rales, rhonchi, wheezes or rubs  MS: no deformity or atrophy - drawer sign negative - normal rom of lower extremities - pain to palpation on medial aspect of knee and with internal rotation Skin: warm and dry, no rash       Assessment & Plan:    Sprain of medial collateral ligament of  right knee, initial encounter Recommend ice/rom exercises Continue tylenol /advil as needed Anxiety with depression -     LORazepam ; Take 1 tablet (1 mg total) by mouth daily as needed.  Dispense: 30 tablet; Refill: 1     Follow-up: Return if symptoms worsen or fail to improve.  An After Visit Summary was printed and given to the patient.  CAMIE JONELLE NICHOLAUS DEVONNA [facility] [phone]

## 2024-02-02 ENCOUNTER — Other Ambulatory Visit: Payer: Self-pay | Admitting: Physician Assistant

## 2024-02-02 ENCOUNTER — Other Ambulatory Visit (HOSPITAL_BASED_OUTPATIENT_CLINIC_OR_DEPARTMENT_OTHER): Payer: Self-pay

## 2024-02-02 MED ORDER — TRAMADOL HCL 50 MG PO TABS
50.0000 mg | ORAL_TABLET | Freq: Four times a day (QID) | ORAL | 0 refills | Status: AC | PRN
  Filled 2024-02-02: qty 15, 4d supply, fill #0

## 2024-02-12 ENCOUNTER — Other Ambulatory Visit (HOSPITAL_BASED_OUTPATIENT_CLINIC_OR_DEPARTMENT_OTHER): Payer: Self-pay

## 2024-02-12 ENCOUNTER — Other Ambulatory Visit: Payer: Self-pay | Admitting: Family Medicine

## 2024-02-12 DIAGNOSIS — G8929 Other chronic pain: Secondary | ICD-10-CM

## 2024-02-12 DIAGNOSIS — S83411A Sprain of medial collateral ligament of right knee, initial encounter: Secondary | ICD-10-CM

## 2024-02-12 MED ORDER — HYDROCODONE-ACETAMINOPHEN 10-325 MG PO TABS
1.0000 | ORAL_TABLET | Freq: Four times a day (QID) | ORAL | 0 refills | Status: AC | PRN
  Filled 2024-02-12: qty 20, 5d supply, fill #0

## 2024-02-19 ENCOUNTER — Ambulatory Visit: Admitting: Physician Assistant

## 2024-02-19 ENCOUNTER — Encounter: Payer: Self-pay | Admitting: Physician Assistant

## 2024-02-19 ENCOUNTER — Other Ambulatory Visit (HOSPITAL_BASED_OUTPATIENT_CLINIC_OR_DEPARTMENT_OTHER): Payer: Self-pay

## 2024-02-19 VITALS — BP 118/88 | HR 88 | Ht 63.0 in | Wt 193.0 lb

## 2024-02-19 DIAGNOSIS — F418 Other specified anxiety disorders: Secondary | ICD-10-CM

## 2024-02-19 DIAGNOSIS — R5381 Other malaise: Secondary | ICD-10-CM

## 2024-02-19 DIAGNOSIS — A084 Viral intestinal infection, unspecified: Secondary | ICD-10-CM

## 2024-02-19 LAB — POCT URINALYSIS DIP (CLINITEK)
Bilirubin, UA: NEGATIVE
Blood, UA: NEGATIVE
Glucose, UA: NEGATIVE mg/dL
Ketones, POC UA: NEGATIVE mg/dL
Leukocytes, UA: NEGATIVE
Nitrite, UA: NEGATIVE
POC PROTEIN,UA: NEGATIVE
Spec Grav, UA: 1.015
Urobilinogen, UA: 1 U/dL
pH, UA: 7.5

## 2024-02-19 LAB — POCT INFLUENZA A/B
Influenza A, POC: NEGATIVE
Influenza B, POC: NEGATIVE

## 2024-02-19 LAB — POC COVID19 BINAXNOW: SARS Coronavirus 2 Ag: NEGATIVE

## 2024-02-19 MED ORDER — VENLAFAXINE HCL ER 150 MG PO CP24
150.0000 mg | ORAL_CAPSULE | Freq: Every day | ORAL | 1 refills | Status: DC
  Filled 2024-02-19: qty 90, 90d supply, fill #0

## 2024-02-19 NOTE — Progress Notes (Signed)
 "  Acute Office Visit  Subjective:    Patient ID: Michelle Lamb, female    DOB: 1990-12-19, 33 y.o.   MRN: [id number]  Chief Complaint  Patient presents with   Emesis    HPI: Patient is in today for complaints of malaise, nausea and vomiting.  States that yesterday she had woken up fine and went out shopping then started feeling tired and had several episodes of vomiting.  She has had mild lower abdominal cramping but no diarrhea.  She was able to keep some oranges down.  Denies fever, cough, congestion  Denies urine symptoms  Pt requests refill of effexor   Current Medications[1]  Allergies[2]  ROS CONSTITUTIONAL: see HPI E/N/T: Negative for ear pain, nasal congestion and sore throat.  CARDIOVASCULAR: Negative for chest pain, dizziness, palpitations and pedal edema.  RESPIRATORY: Negative for recent cough and dyspnea.  GASTROINTESTINAL: see HPI INTEGUMENTARY: Negative for rash.  NEUROLOGICAL: intermittent feeling light headed      Objective:    PHYSICAL EXAM:   BP 118/88   Pulse 88   Ht 5' 3 (1.6 m)   Wt 193 lb (87.5 kg)   LMP 12/11/2022   SpO2 96%   BMI 34.19 kg/m    GEN: Well nourished, well developed, in no acute distress  HEENT: normal external ears and nose - normal external auditory canals and TMS - - Lips, Teeth and Gums - normal  Oropharynx - normal mucosa, palate, and posterior pharynx Cardiac: RRR; no murmurs, rubs, Respiratory:  normal respiratory rate and pattern with no distress - normal breath sounds with no rales, rhonchi, wheezes or rubs GI: normal bowel sounds, no masses or tenderness Skin: warm and dry, no rash   Office Visit on 02/19/2024  Component Date Value Ref Range Status   Color, UA 02/19/2024 yellow  yellow Final   Clarity, UA 02/19/2024 clear  clear Final   Glucose, UA 02/19/2024 negative  negative mg/dL Final   Bilirubin, UA 87/77/7974 negative  negative Final   Ketones, POC UA 02/19/2024 negative  negative mg/dL Final    Spec Grav, UA 02/19/2024 1.015  1.010 - 1.025 Final   Blood, UA 02/19/2024 negative  negative Final   pH, UA 02/19/2024 7.5  5.0 - 8.0 Final   POC PROTEIN,UA 02/19/2024 negative  negative, trace Final   Urobilinogen, UA 02/19/2024 1.0  0.2 or 1.0 E.U./dL Final   Nitrite, UA 87/77/7974 Negative  Negative Final   Leukocytes, UA 02/19/2024 Negative  Negative Final   Influenza A, POC 02/19/2024 Negative  Negative Final   Influenza B, POC 02/19/2024 Negative  Negative Final   SARS Coronavirus 2 Ag 02/19/2024 Negative  Negative Final        Assessment & Plan:    Viral gastroenteritis Recommend clear liquids, bland diet Pt has zofran  to use at home Malaise -     POCT URINALYSIS DIP (CLINITEK) -     POCT Influenza A/B -     POC COVID-19 BinaxNow  Anxiety with depression -     Venlafaxine  HCl ER; Take 1 capsule (150 mg total) by mouth daily with breakfast.  Dispense: 90 capsule; Refill: 1     Follow-up: Return if symptoms worsen or fail to improve.  An After Visit Summary was printed and given to the patient.  SARA R Harlyn Rathmann, PA-C [facility] [phone]     [1]  Current Outpatient Medications:    albuterol  (VENTOLIN  HFA) 108 (90 Base) MCG/ACT inhaler, Inhale 2 puffs into the lungs  every 6 (six) hours as needed for wheezing or shortness of breath., Disp: 1 each, Rfl: 5   amphetamine -dextroamphetamine  (ADDERALL) 20 MG tablet, Take 1 tablet (20 mg total) by mouth 2 (two) times daily., Disp: 60 tablet, Rfl: 0   Continuous Glucose Sensor (FREESTYLE LIBRE 3 SENSOR) MISC, Use every 14 days - apply as directed, Disp: 2 each, Rfl: 5   cyanocobalamin  (VITAMIN B12) 1000 MCG/ML injection, Inject 1 mL (1,000 mcg total) into the muscle every 30 (thirty) days., Disp: 1 mL, Rfl: 2   fluticasone  (FLONASE ) 50 MCG/ACT nasal spray, Place 2 sprays into both nostrils daily. (Patient taking differently: Place 2 sprays into both nostrils daily as needed for allergies or rhinitis.), Disp: 16  g, Rfl: 6   linaclotide  (LINZESS ) 290 MCG CAPS capsule, Take one capsule on an empty stomach 30 minutes prior to first meal of the day. Keep medicine in its original container., Disp: 90 capsule, Rfl: 4   LORazepam  (ATIVAN ) 1 MG tablet, Take 1 tablet (1 mg total) by mouth daily as needed., Disp: 30 tablet, Rfl: 1   meclizine  (ANTIVERT ) 25 MG tablet, Take 1 tablet (25 mg total) by mouth 3 (three) times daily as needed for dizziness., Disp: 30 tablet, Rfl: 0   Multiple Vitamin (MULTIVITAMIN WITH MINERALS) TABS tablet, Take 1 tablet by mouth daily., Disp: , Rfl:    ondansetron  (ZOFRAN -ODT) 4 MG disintegrating tablet, Take 1 tablet (4 mg total) by mouth every 8 (eight) hours as needed for nausea or vomiting., Disp: 20 tablet, Rfl: 0   pantoprazole  (PROTONIX ) 40 MG tablet, Take 1 tablet (40 mg total) by mouth daily., Disp: 30 tablet, Rfl: 11   Sodium Fluoride  (SODIUM FLUORIDE  5000 PPM) 1.1 % PSTE, Apply 1 application to the teeth twice a day; . Floss first, then brush 2 times daily with PreviDent toothpaste. Expectorate. Do not rinse, use mouth rinse, eat, or drink for 30 min after use., Disp: 100 mL, Rfl: 5   venlafaxine  XR (EFFEXOR  XR) 150 MG 24 hr capsule, Take 1 capsule (150 mg total) by mouth daily with breakfast., Disp: 90 capsule, Rfl: 1   Vitamin D , Ergocalciferol , (DRISDOL ) 1.25 MG (50000 UNIT) CAPS capsule, Take 1 capsule (50,000 Units total) by mouth 2 (two) times a week., Disp: 8 capsule, Rfl: 3 [2]  Allergies Allergen Reactions   Bactrim  [Sulfamethoxazole -Trimethoprim ] Hives   Dilaudid  [Hydromorphone ] Rash   Sulfa  Antibiotics Rash   "

## 2024-02-23 ENCOUNTER — Encounter: Payer: Self-pay | Admitting: Family Medicine

## 2024-02-23 ENCOUNTER — Other Ambulatory Visit (HOSPITAL_BASED_OUTPATIENT_CLINIC_OR_DEPARTMENT_OTHER): Payer: Self-pay

## 2024-02-23 ENCOUNTER — Ambulatory Visit: Admitting: Family Medicine

## 2024-02-23 VITALS — BP 156/100 | HR 97 | Temp 98.4°F | Resp 16 | Ht 63.0 in | Wt 193.0 lb

## 2024-02-23 DIAGNOSIS — R5383 Other fatigue: Secondary | ICD-10-CM

## 2024-02-23 DIAGNOSIS — R112 Nausea with vomiting, unspecified: Secondary | ICD-10-CM | POA: Diagnosis not present

## 2024-02-23 DIAGNOSIS — E611 Iron deficiency: Secondary | ICD-10-CM

## 2024-02-23 DIAGNOSIS — R7989 Other specified abnormal findings of blood chemistry: Secondary | ICD-10-CM

## 2024-02-23 DIAGNOSIS — H8113 Benign paroxysmal vertigo, bilateral: Secondary | ICD-10-CM

## 2024-02-23 MED ORDER — PROMETHAZINE HCL 25 MG PO TABS: 25.0000 mg | ORAL_TABLET | Freq: Once | ORAL | Status: DC

## 2024-02-23 MED ORDER — PROMETHAZINE HCL 25 MG PO TABS
25.0000 mg | ORAL_TABLET | Freq: Four times a day (QID) | ORAL | 0 refills | Status: DC | PRN
  Filled 2024-02-23: qty 60, 15d supply, fill #0

## 2024-02-23 MED ORDER — PROMETHAZINE HCL 25 MG/ML IJ SOLN
25.0000 mg | Freq: Once | INTRAMUSCULAR | Status: AC
  Administered 2024-02-23: 25 mg via INTRAMUSCULAR

## 2024-02-23 NOTE — Assessment & Plan Note (Signed)
 Check iron  levels.   Orders:   Iron , TIBC and Ferritin Panel

## 2024-02-23 NOTE — Patient Instructions (Signed)
 BPPV EDUCATION GIVEN. MECLIZINE  AND PHENERGAN  PRESCRIPTIONS GIVEN.  CHECK LABS.

## 2024-02-23 NOTE — Assessment & Plan Note (Signed)
 Education given.  Discussed Epley maneuver with patient.  Utilize meclizine  and phenergan .

## 2024-02-23 NOTE — Assessment & Plan Note (Signed)
 Check labs

## 2024-02-23 NOTE — Assessment & Plan Note (Signed)
 Check labs.  Orders:   PTH, Intact and Calcium

## 2024-02-23 NOTE — Progress Notes (Signed)
 "  Acute Office Visit  Subjective:    Patient ID: Michelle Lamb, female    DOB: [birth year], 33 y.o.   MRN: [id number]  Chief Complaint  Patient presents with   Dizziness    HPI: Patient is a 33 year old female who presents for follow-up of severe dizziness this week.  She was seen on Monday by Ginnie Moats, PAC. She was unable to work this week. Patient is having spinning/vertigo. Significant nausea and vomiting. No diarrhea. No fever or chills now. Had some chills and aching on Monday.  She is concerned about her brain and wondering if she needs a scan.  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   Blood transfusion without reported diagnosis 2018   postpartum   Chest pain of uncertain etiology 06/08/2022   06/15/22 Myocardial perfusion - normal, 09/15/22 long term heart monitor in Epic   Chronic kidney disease    right kidney at 35%   Complication of anesthesia    Slow to wake up   Constipation 06/28/2022   Diabetes mellitus without complication (HCC)    NOT AN ISSUE SINCE GASTRIC BYPASS IN 2022. 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  Report date is 02/23/2022.      The CancerNext-Expanded gene panel offered by [facility] and includes sequencing, rearrangement, and RNA analysis for the following 77 genes: AIP, ALK, APC, ATM, AXIN2, BAP1, BARD1, BLM, BMPR1A, BRCA1, BRCA2, BRIP1, CDC73, CDH1, CDK4, CDKN1B, CDKN2A, CHEK2,  CTNNA1, DICER1, FANCC, FH, FLCN, GALNT12, KIF1B, LZTR1,   Hepatic steatosis 09/08/2021   History of herpes simplex infection    History of partial nephrectomy 03/21/2022   Formatting of this note might be different from the original. 03/15/2021: Underwent right partial nephrectomy by Dr. Renda for right renal neoplasm. Pathology: Angiomyolipoma. Formatting of this note might be different from the original. 03/15/2021: Underwent right partial nephrectomy by Dr. Renda for right renal neoplasm. Pathology: Angiomyolipoma.   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   Morbid obesity (HCC) 06/02/2020   Neoplasm of right kidney    benign tumor of kidney   Obesity (BMI 30.0-34.9) 06/08/2022   Obstructive sleep apnea 10/29/2019   hx of before gastric bypass, no longer uses CPAP   Racing heart beat 08/22/2022   see 09/15/22 Long term heart monitor in Epic   Seasonal allergies    Sleep apnea    Ureteral obstruction, right 07/15/2021   Vaginal delivery 2009, 2010, 2015   Vitamin D  insufficiency 09/30/2020   Weight loss 09/30/2020    Past Surgical History:  Procedure Laterality Date  ABDOMINAL HYSTERECTOMY  2024   APPENDECTOMY     CESAREAN SECTION  09/29/2016   CHOLECYSTECTOMY     COLONOSCOPY WITH ESOPHAGOGASTRODUODENOSCOPY (EGD)  07/22/2022   CYSTOSCOPY N/A 12/27/2022   Procedure: CYSTOSCOPY;  Surgeon: Glennon Almarie POUR, MD;  Location: [facility];  Service: Gynecology;  Laterality: N/A;   CYSTOSCOPY W/ URETERAL STENT PLACEMENT Right 07/15/2021   Procedure: CYSTOSCOPY WITH RETROGRADE PYELOGRAM/URETERAL STENT PLACEMENT;  Surgeon:  Renda Glance, MD;  Location: [facility];  Service: Urology;  Laterality: Right;   CYSTOSCOPY WITH RETROGRADE PYELOGRAM, URETEROSCOPY AND STENT PLACEMENT Right 05/03/2021   Procedure: CYSTOSCOPY WITH RIGHT RETROGRADE PYELOGRAM, URETEROSCOPY;  Surgeon: Renda Glance, MD;  Location: [facility];  Service: Urology;  Laterality: Right;   DILATION AND CURETTAGE OF UTERUS N/A 12/31/2015   Procedure: SUCTION DILATATION AND CURETTAGE;  Surgeon: Bebe Furry, MD;  Location: [facility];  Service: Gynecology;  Laterality: N/A;   DILATION AND EVACUATION N/A 01/01/2016   Procedure: DILATATION AND EVACUATION;  Surgeon: Norleen LULLA Server, MD;  Location: [facility];  Service: Gynecology;  Laterality: N/A;   ESOPHAGOGASTRODUODENOSCOPY     GASTRIC BYPASS  06/02/2020   IR NEPHRO TUBE REMOV/FL  07/16/2021   IR NEPHROSTOMY EXCHANGE RIGHT  05/12/2021   IR NEPHROSTOMY PLACEMENT RIGHT  05/05/2021   PANNICULECTOMY N/A 08/25/2023   Procedure: PANNICULECTOMY;  Surgeon: Waddell Leonce NOVAK, MD;  Location: [facility];  Service: Plastics;  Laterality: N/A;   ROBOT ASSISTED PYELOPLASTY Right 07/15/2021   Procedure: XI ROBOTIC ASSISTED  LAPAROSCOPIC PYELOPLASTY;  Surgeon: Renda Glance, MD;  Location: [facility];  Service: Urology;  Laterality: Right;   ROBOTIC ASSISTED LAPAROSCOPIC HYSTERECTOMY AND SALPINGECTOMY Bilateral 12/27/2022   Procedure: XI ROBOTIC ASSISTED LAPAROSCOPIC HYSTERECTOMY AND SALPINGECTOMY;  Surgeon: Glennon Almarie POUR, MD;  Location: [facility];  Service: Gynecology;  Laterality: Bilateral;   ROBOTIC ASSITED PARTIAL NEPHRECTOMY Right 03/15/2021   Procedure: XI ROBOTIC ASSITED PARTIAL NEPHRECTOMY;  Surgeon: Renda Glance, MD;  Location: [facility];  Service: Urology;  Laterality: Right;   TONSILLECTOMY     TUBAL LIGATION     UPPER GASTROINTESTINAL ENDOSCOPY     w/dilation    Family History  Problem Relation Age of Onset   Hypertension Mother    Thyroid  disease Mother    Cervical cancer  Mother        dx 57s   Depression Mother    Diabetes Mother    Hyperlipidemia Mother    Anxiety disorder Mother    Asthma Father    Stomach cancer Maternal Grandmother        dx 10s   Leukemia Maternal Grandfather        d. 33s   Depression Paternal Grandmother    Diabetes Paternal Grandmother    Cancer Paternal Grandfather        dx after 59; mets   Breast cancer Maternal Great-grandmother        dx <50; mets; MGF's mother   Stomach cancer Other        MGM's brother; dx after 35   Cancer Other        MGF's sisters x3; unknown primary   ADD / ADHD Daughter    Colon cancer Neg Hx    Colon polyps Neg Hx    Esophageal cancer Neg Hx    Rectal cancer Neg Hx     Social History   Socioeconomic History   Marital status: Married    Spouse name: Not on file   Number of  children: 9   Years of education: 13   Highest education level: Associate degree: academic program  Occupational History   Occupation: Scientist, Forensic  Tobacco Use   Smoking status: Never   Smokeless tobacco: Never  Vaping Use   Vaping status: Never Used  Substance and Sexual Activity   Alcohol use: Yes    Comment: occasionally   Drug use: Never   Sexual activity: Not Currently    Partners: Male    Birth control/protection: Surgical    Comment: tubal, hysterectomy  Other Topics Concern   Not on file  Social History Narrative   Not on file   Social Drivers of Health   Tobacco Use: Low Risk (02/23/2024)   Patient History    Smoking Tobacco Use: Never    Smokeless Tobacco Use: Never    Passive Exposure: Not on file  Financial Resource Strain: Low Risk (12/21/2023)   Overall Financial Resource Strain (CARDIA)    Difficulty of Paying Living Expenses: Not very hard  Food Insecurity: No Food Insecurity (12/21/2023)   Epic    Worried About Radiation Protection Practitioner of Food in the Last Year: Never true    Ran Out of Food in the Last Year: Never true  Transportation Needs: No Transportation Needs  (12/21/2023)   Epic    Lack of Transportation (Medical): No    Lack of Transportation (Non-Medical): No  Physical Activity: Insufficiently Active (12/21/2023)   Exercise Vital Sign    Days of Exercise per Week: 3 days    Minutes of Exercise per Session: 20 min  Stress: Stress Concern Present (12/21/2023)   [employer] of Occupational Health - Occupational Stress Questionnaire    Feeling of Stress: To some extent  Social Connections: Moderately Integrated (12/21/2023)   Social Connection and Isolation Panel    Frequency of Communication with Friends and Family: Patient declined    Frequency of Social Gatherings with Friends and Family: Three times a week    Attends Religious Services: 1 to 4 times per year    Active Member of Clubs or Organizations: No    Attends Banker Meetings: Not on file    Marital Status: Married  Intimate Partner Violence: Not At Risk (09/29/2023)   Epic    Fear of Current or Ex-Partner: No    Emotionally Abused: No    Physically Abused: No    Sexually Abused: No  Depression (PHQ2-9): High Risk (12/21/2023)   Depression (PHQ2-9)    PHQ-2 Score: 17  Alcohol Screen: Low Risk (10/06/2023)   Alcohol Screen    Last Alcohol Screening Score (AUDIT): 1  Housing: Low Risk (12/21/2023)   Epic    Unable to Pay for Housing in the Last Year: No    Number of Times Moved in the Last Year: 1    Homeless in the Last Year: No  Utilities: Not At Risk (09/29/2023)   Epic    Threatened with loss of utilities: No  Health Literacy: Adequate Health Literacy (04/07/2023)   B1300 Health Literacy    Frequency of need for help with medical instructions: Never    Outpatient Medications Prior to Visit  Medication Sig Dispense Refill   albuterol  (VENTOLIN  HFA) 108 (90 Base) MCG/ACT inhaler Inhale 2 puffs into the lungs every 6 (six) hours as needed for wheezing or shortness of breath. 1 each 5   amphetamine -dextroamphetamine  (ADDERALL) 20 MG tablet Take 1 tablet (20  mg total) by mouth 2 (two) times daily. 60 tablet 0  Continuous Glucose Sensor (FREESTYLE LIBRE 3 SENSOR) MISC Use every 14 days - apply as directed 2 each 5   cyanocobalamin  (VITAMIN B12) 1000 MCG/ML injection Inject 1 mL (1,000 mcg total) into the muscle every 30 (thirty) days. 1 mL 2   fluticasone  (FLONASE ) 50 MCG/ACT nasal spray Place 2 sprays into both nostrils daily. (Patient taking differently: Place 2 sprays into both nostrils daily as needed for allergies or rhinitis.) 16 g 6   linaclotide  (LINZESS ) 290 MCG CAPS capsule Take one capsule on an empty stomach 30 minutes prior to first meal of the day. Keep medicine in its original container. 90 capsule 4   LORazepam  (ATIVAN ) 1 MG tablet Take 1 tablet (1 mg total) by mouth daily as needed. 30 tablet 1   meclizine  (ANTIVERT ) 25 MG tablet Take 1 tablet (25 mg total) by mouth 3 (three) times daily as needed for dizziness. 30 tablet 0   Multiple Vitamin (MULTIVITAMIN WITH MINERALS) TABS tablet Take 1 tablet by mouth daily.     ondansetron  (ZOFRAN -ODT) 4 MG disintegrating tablet Take 1 tablet (4 mg total) by mouth every 8 (eight) hours as needed for nausea or vomiting. 20 tablet 0   pantoprazole  (PROTONIX ) 40 MG tablet Take 1 tablet (40 mg total) by mouth daily. 30 tablet 11   Sodium Fluoride  (SODIUM FLUORIDE  5000 PPM) 1.1 % PSTE Apply 1 application to the teeth twice a day; . Floss first, then brush 2 times daily with PreviDent toothpaste. Expectorate. Do not rinse, use mouth rinse, eat, or drink for 30 min after use. 100 mL 5   venlafaxine  XR (EFFEXOR  XR) 150 MG 24 hr capsule Take 1 capsule (150 mg total) by mouth daily with breakfast. 90 capsule 1   Vitamin D , Ergocalciferol , (DRISDOL ) 1.25 MG (50000 UNIT) CAPS capsule Take 1 capsule (50,000 Units total) by mouth 2 (two) times a week. 8 capsule 3   No facility-administered medications prior to visit.    Allergies[1]  Review of Systems  Constitutional:  Positive for fatigue. Negative for  chills and fever.  HENT:  Negative for congestion, ear pain and sore throat.   Respiratory:  Negative for cough and shortness of breath.   Cardiovascular:  Negative for chest pain.  Gastrointestinal:  Positive for nausea and vomiting.  Neurological:  Positive for dizziness.       Objective:        02/23/2024    9:12 AM 02/19/2024    9:48 AM 02/01/2024    1:23 PM  Vitals with BMI  Height 5' 3 5' 3 5' 3  Weight 193 lbs 193 lbs 197 lbs  BMI 34.2 34.2 34.91  Systolic 156 118 865  Diastolic 100 88 74  Pulse 97 88 90    No data found.   Physical Exam Vitals reviewed.  Constitutional:      Appearance: Normal appearance.  HENT:     Right Ear: Tympanic membrane, ear canal and external ear normal.     Left Ear: Tympanic membrane, ear canal and external ear normal.     Nose: Nose normal.     Mouth/Throat:     Pharynx: Oropharynx is clear.  Cardiovascular:     Rate and Rhythm: Normal rate and regular rhythm.     Heart sounds: Normal heart sounds. No murmur heard. Pulmonary:     Effort: Pulmonary effort is normal. No respiratory distress.     Breath sounds: Normal breath sounds.  Lymphadenopathy:     Cervical: No cervical adenopathy.  Neurological:  Mental Status: She is alert and oriented to person, place, and time.     Comments: Positive dix hall pike maneuver BL. Left > Right.  Proceeded with epley maneuver BL.   Psychiatric:        Mood and Affect: Mood normal.        Behavior: Behavior normal.     Health Maintenance Due  Topic Date Due   OPHTHALMOLOGY EXAM  09/16/2023    There are no preventive care reminders to display for this patient.   Lab Results  Component Value Date   TSH 0.880 10/11/2023   Lab Results  Component Value Date   WBC 12.6 (H) 12/19/2023   HGB 13.1 12/19/2023   HCT 40.8 12/19/2023   MCV 89 12/19/2023   PLT 320 12/19/2023   Lab Results  Component Value Date   NA 138 12/20/2023   K 4.1 12/20/2023   CO2 24 12/20/2023    GLUCOSE 85 12/20/2023   BUN 8 12/20/2023   CREATININE 0.60 12/20/2023   BILITOT 0.5 10/11/2023   ALKPHOS 114 10/11/2023   AST 21 10/11/2023   ALT 27 10/11/2023   PROT 7.1 10/11/2023   ALBUMIN 4.2 12/20/2023   CALCIUM 9.0 12/20/2023   ANIONGAP 6 09/30/2023   EGFR 120 12/20/2023   Lab Results  Component Value Date   CHOL 236 (H) 10/11/2023   Lab Results  Component Value Date   HDL 99 10/11/2023   Lab Results  Component Value Date   LDLCALC 129 (H) 10/11/2023   Lab Results  Component Value Date   TRIG 49 10/11/2023   Lab Results  Component Value Date   CHOLHDL 2.4 10/11/2023   Lab Results  Component Value Date   HGBA1C 4.8 10/11/2023        Results for orders placed or performed in visit on 02/19/24  POCT URINALYSIS DIP (CLINITEK)   Collection Time: 02/19/24  9:50 AM  Result Value Ref Range   Color, UA yellow yellow   Clarity, UA clear clear   Glucose, UA negative negative mg/dL   Bilirubin, UA negative negative   Ketones, POC UA negative negative mg/dL   Spec Grav, UA 8.984 8.989 - 1.025   Blood, UA negative negative   pH, UA 7.5 5.0 - 8.0   POC PROTEIN,UA negative negative, trace   Urobilinogen, UA 1.0 0.2 or 1.0 E.U./dL   Nitrite, UA Negative Negative   Leukocytes, UA Negative Negative  POCT Influenza A/B   Collection Time: 02/19/24 10:37 AM  Result Value Ref Range   Influenza A, POC Negative Negative   Influenza B, POC Negative Negative  POC COVID-19 BinaxNow   Collection Time: 02/19/24 10:37 AM  Result Value Ref Range   SARS Coronavirus 2 Ag Negative Negative     Assessment & Plan:   Assessment & Plan Benign paroxysmal positional vertigo due to bilateral vestibular disorder Education given.  Discussed Epley maneuver with patient.  Utilize meclizine  and phenergan .     Nausea and vomiting, unspecified vomiting type Check labs.  Prescription phenergan .  Orders:   CBC with Differential/Platelet   Comprehensive metabolic panel with GFR    promethazine  (PHENERGAN ) 25 MG tablet; Take 1 tablet (25 mg total) by mouth every 6 (six) hours as needed for nausea or vomiting.   promethazine  (PHENERGAN ) injection 25 mg  Iron  deficiency Check iron  levels.   Orders:   Iron , TIBC and Ferritin Panel  Other fatigue Check labs.     Elevated parathyroid  hormone Check labs.  Orders:  PTH, Intact and Calcium     Body mass index is 34.19 kg/m..  Meds ordered this encounter  Medications   DISCONTD: promethazine  (PHENERGAN ) tablet 25 mg   promethazine  (PHENERGAN ) 25 MG tablet    Sig: Take 1 tablet (25 mg total) by mouth every 6 (six) hours as needed for nausea or vomiting.    Dispense:  60 tablet    Refill:  0   DISCONTD: promethazine  (PHENERGAN ) tablet 25 mg   promethazine  (PHENERGAN ) injection 25 mg    Orders Placed This Encounter  Procedures   CBC with Differential/Platelet   Comprehensive metabolic panel with GFR   PTH, Intact and Calcium   Iron , TIBC and Ferritin Panel     Follow-up: Return if symptoms worsen or fail to improve.  An After Visit Summary was printed and given to the patient.  Abigail Free, MD [facility] [phone]     [1]  Allergies Allergen Reactions   Bactrim  [Sulfamethoxazole -Trimethoprim ] Hives   Dilaudid  [Hydromorphone ] Rash   Sulfa  Antibiotics Rash   "

## 2024-02-23 NOTE — Assessment & Plan Note (Signed)
 Check labs.  Prescription phenergan .  Orders:   CBC with Differential/Platelet   Comprehensive metabolic panel with GFR   promethazine  (PHENERGAN ) 25 MG tablet; Take 1 tablet (25 mg total) by mouth every 6 (six) hours as needed for nausea or vomiting.   promethazine  (PHENERGAN ) injection 25 mg

## 2024-02-24 ENCOUNTER — Ambulatory Visit: Payer: Self-pay | Admitting: Family Medicine

## 2024-02-24 LAB — CBC WITH DIFFERENTIAL/PLATELET
Basophils Absolute: 0.1 x10E3/uL (ref 0.0–0.2)
Basos: 1 %
EOS (ABSOLUTE): 0.2 x10E3/uL (ref 0.0–0.4)
Eos: 2 %
Hematocrit: 41.7 % (ref 34.0–46.6)
Hemoglobin: 13.4 g/dL (ref 11.1–15.9)
Immature Grans (Abs): 0 x10E3/uL (ref 0.0–0.1)
Immature Granulocytes: 0 %
Lymphocytes Absolute: 2.4 x10E3/uL (ref 0.7–3.1)
Lymphs: 26 %
MCH: 29.1 pg (ref 26.6–33.0)
MCHC: 32.1 g/dL (ref 31.5–35.7)
MCV: 91 fL (ref 79–97)
Monocytes Absolute: 0.7 x10E3/uL (ref 0.1–0.9)
Monocytes: 7 %
Neutrophils Absolute: 5.8 x10E3/uL (ref 1.4–7.0)
Neutrophils: 64 %
Platelets: 328 x10E3/uL (ref 150–450)
RBC: 4.61 x10E6/uL (ref 3.77–5.28)
RDW: 13.9 % (ref 11.7–15.4)
WBC: 9.1 x10E3/uL (ref 3.4–10.8)

## 2024-02-24 LAB — COMPREHENSIVE METABOLIC PANEL WITH GFR
ALT: 32 IU/L (ref 0–32)
AST: 22 IU/L (ref 0–40)
Albumin: 4.6 g/dL (ref 3.9–4.9)
Alkaline Phosphatase: 109 IU/L (ref 41–116)
BUN/Creatinine Ratio: 30 — ABNORMAL HIGH (ref 9–23)
BUN: 16 mg/dL (ref 6–20)
Bilirubin Total: 0.8 mg/dL (ref 0.0–1.2)
CO2: 26 mmol/L (ref 20–29)
Calcium: 9.2 mg/dL (ref 8.7–10.2)
Chloride: 100 mmol/L (ref 96–106)
Creatinine, Ser: 0.54 mg/dL — ABNORMAL LOW (ref 0.57–1.00)
Globulin, Total: 2.1 g/dL (ref 1.5–4.5)
Glucose: 113 mg/dL — ABNORMAL HIGH (ref 70–99)
Potassium: 4.1 mmol/L (ref 3.5–5.2)
Sodium: 139 mmol/L (ref 134–144)
Total Protein: 6.7 g/dL (ref 6.0–8.5)
eGFR: 123 mL/min/1.73

## 2024-02-24 LAB — PTH, INTACT AND CALCIUM: PTH: 50 pg/mL (ref 15–65)

## 2024-02-24 LAB — IRON,TIBC AND FERRITIN PANEL
Ferritin: 31 ng/mL (ref 15–150)
Iron Saturation: 30 % (ref 15–55)
Iron: 120 ug/dL (ref 27–159)
Total Iron Binding Capacity: 404 ug/dL (ref 250–450)
UIBC: 284 ug/dL (ref 131–425)

## 2024-02-26 ENCOUNTER — Ambulatory Visit

## 2024-02-26 VITALS — BP 130/80 | HR 90

## 2024-02-26 DIAGNOSIS — R030 Elevated blood-pressure reading, without diagnosis of hypertension: Secondary | ICD-10-CM

## 2024-02-26 NOTE — Progress Notes (Signed)
 Patient is in office today for a nurse visit for Blood Pressure Check. Patient blood pressure was 150/84  pulse 97, Patient Dizziness when she moves her head to the side, up and down. No chest pain, no palpitations, no headache.    Recheck blood pressure  was 130 80 and pulse 90

## 2024-02-27 ENCOUNTER — Other Ambulatory Visit (HOSPITAL_BASED_OUTPATIENT_CLINIC_OR_DEPARTMENT_OTHER): Payer: Self-pay

## 2024-02-27 ENCOUNTER — Encounter: Payer: Self-pay | Admitting: Physician Assistant

## 2024-02-27 ENCOUNTER — Ambulatory Visit (INDEPENDENT_AMBULATORY_CARE_PROVIDER_SITE_OTHER): Admitting: Physician Assistant

## 2024-02-27 VITALS — BP 128/92 | HR 82 | Temp 97.9°F | Resp 18 | Ht 63.0 in | Wt 193.0 lb

## 2024-02-27 DIAGNOSIS — I10 Essential (primary) hypertension: Secondary | ICD-10-CM

## 2024-02-27 DIAGNOSIS — R42 Dizziness and giddiness: Secondary | ICD-10-CM | POA: Diagnosis not present

## 2024-02-27 LAB — SPECIMEN STATUS REPORT

## 2024-02-27 LAB — TSH: TSH: 0.915 u[IU]/mL (ref 0.450–4.500)

## 2024-02-27 MED ORDER — LOSARTAN POTASSIUM 25 MG PO TABS
25.0000 mg | ORAL_TABLET | Freq: Every day | ORAL | 1 refills | Status: DC
  Filled 2024-02-27: qty 30, 30d supply, fill #0

## 2024-02-27 NOTE — Progress Notes (Signed)
 "  Acute Office Visit  Subjective:    Patient ID: Michelle Lamb, female    DOB: Jan 12, 1991, 33 y.o.   MRN: [id number]  Chief Complaint  Patient presents with   Dizziness    HPI: Patient is in today for complaints of dizziness.  Last week on Sunday she started having malaise, nausea and vomiting - she tested negative for flu and COVID and treated for assumed viral gastroenteritis.  Her stomach symptoms started to improve but then began having feelings of dizziness.  The dizziness last week was constant but now seems to be more with sudden movements and turning head or changing position quickly.  She was seen in office by Dr Sherre on Friday and after doing BPPV maneuvers felt worse.  She is taking meclizine  bid and phenergan  for nausea.  She denies any other symptoms at this time  Of note blood pressure has been elevated at all visits recently and pt does have history of hypertension - she has been off medication however for the past 5 years.  She denies chest pain/dyspnea  Current Medications[1]  Allergies[2]  ROS CONSTITUTIONAL:see HPI E/N/T: Negative for ear pain, nasal congestion and sore throat.  CARDIOVASCULAR: Negative for chest pain, dizziness, palpitations and pedal edema.  RESPIRATORY: Negative for recent cough and dyspnea.  GASTROINTESTINAL: see HPI MSK: Negative for arthralgias and myalgias.  NEUROLOGICAL: see HPI      Objective:    PHYSICAL EXAM:   BP (!) 128/92 (Cuff Size: Large)   Pulse 82   Temp 97.9 F (36.6 C) (Temporal)   Resp 18   Ht 5' 3 (1.6 m)   Wt 193 lb (87.5 kg)   LMP 12/11/2022   SpO2 98%   BMI 34.19 kg/m   Orthostatic Vitals for the past 48 hrs (Last 6 readings):  Patient Position Orthostatic BP Orthostatic Pulse BP Pulse BP Location Cuff Size  02/27/24 0925 -- -- -- (!) 150/82 82 -- --  02/27/24 0937 Supine (!) 144/92 77 -- -- Left Arm Large  02/27/24 0938 Sitting 140/80 88 -- -- Left Arm Large  02/27/24 0939 Standing 132/70 95 --  -- Left Arm Large  02/27/24 1008 -- -- -- (!) 128/92 -- -- Large    GEN: Well nourished, well developed, in no acute distress  HEENT: normal external ears and nose - normal external auditory canals and TMS -- Lips, Teeth and Gums - normal  Oropharynx - normal mucosa, palate, and posterior pharynx Cardiac: RRR; no murmurs, rubs, Respiratory:  normal respiratory rate and pattern with no distress - normal breath sounds with no rales, rhonchi, wheezes or rubs Neuro:  Alert and Oriented x 3,  - CN II-Xii grossly intact Psych: euthymic mood, appropriate affect and demeanor     Assessment & Plan:    Benign hypertension -     Losartan Potassium; Take 1 tablet (25 mg total) by mouth daily.  Dispense: 30 tablet; Refill: 1  Dizziness Added TSH to labwork Increase meclizine  to tid Stay hydrated If symptoms persist into next week consider MRI brain for further evaluation of symptoms    Follow-up: Return if symptoms worsen or fail to improve.  An After Visit Summary was printed and given to the patient.  SARA R Jeramey Lanuza, PA-C [facility] [phone]     [1]  Current Outpatient Medications:    albuterol  (VENTOLIN  HFA) 108 (90 Base) MCG/ACT inhaler, Inhale 2 puffs into the lungs every 6 (six) hours as needed for wheezing or shortness of  breath., Disp: 1 each, Rfl: 5   amphetamine -dextroamphetamine  (ADDERALL) 20 MG tablet, Take 1 tablet (20 mg total) by mouth 2 (two) times daily., Disp: 60 tablet, Rfl: 0   Continuous Glucose Sensor (FREESTYLE LIBRE 3 SENSOR) MISC, Use every 14 days - apply as directed, Disp: 2 each, Rfl: 5   cyanocobalamin  (VITAMIN B12) 1000 MCG/ML injection, Inject 1 mL (1,000 mcg total) into the muscle every 30 (thirty) days., Disp: 1 mL, Rfl: 2   fluticasone  (FLONASE ) 50 MCG/ACT nasal spray, Place 2 sprays into both nostrils daily., Disp: 16 g, Rfl: 6   linaclotide  (LINZESS ) 290 MCG CAPS capsule, Take one capsule on an empty stomach 30 minutes prior to first meal  of the day. Keep medicine in its original container., Disp: 90 capsule, Rfl: 4   LORazepam  (ATIVAN ) 1 MG tablet, Take 1 tablet (1 mg total) by mouth daily as needed., Disp: 30 tablet, Rfl: 1   losartan (COZAAR) 25 MG tablet, Take 1 tablet (25 mg total) by mouth daily., Disp: 30 tablet, Rfl: 1   meclizine  (ANTIVERT ) 25 MG tablet, Take 1 tablet (25 mg total) by mouth 3 (three) times daily as needed for dizziness., Disp: 30 tablet, Rfl: 0   Multiple Vitamin (MULTIVITAMIN WITH MINERALS) TABS tablet, Take 1 tablet by mouth daily., Disp: , Rfl:    ondansetron  (ZOFRAN -ODT) 4 MG disintegrating tablet, Take 1 tablet (4 mg total) by mouth every 8 (eight) hours as needed for nausea or vomiting., Disp: 20 tablet, Rfl: 0   pantoprazole  (PROTONIX ) 40 MG tablet, Take 1 tablet (40 mg total) by mouth daily., Disp: 30 tablet, Rfl: 11   promethazine  (PHENERGAN ) 25 MG tablet, Take 1 tablet (25 mg total) by mouth every 6 (six) hours as needed for nausea or vomiting., Disp: 60 tablet, Rfl: 0   Sodium Fluoride  (SODIUM FLUORIDE  5000 PPM) 1.1 % PSTE, Apply 1 application to the teeth twice a day; . Floss first, then brush 2 times daily with PreviDent toothpaste. Expectorate. Do not rinse, use mouth rinse, eat, or drink for 30 min after use., Disp: 100 mL, Rfl: 5   venlafaxine  XR (EFFEXOR  XR) 150 MG 24 hr capsule, Take 1 capsule (150 mg total) by mouth daily with breakfast., Disp: 90 capsule, Rfl: 1   Vitamin D , Ergocalciferol , (DRISDOL ) 1.25 MG (50000 UNIT) CAPS capsule, Take 1 capsule (50,000 Units total) by mouth 2 (two) times a week., Disp: 8 capsule, Rfl: 3 [2]  Allergies Allergen Reactions   Bactrim  [Sulfamethoxazole -Trimethoprim ] Hives   Dilaudid  [Hydromorphone ] Rash   Sulfa  Antibiotics Rash   "

## 2024-03-05 ENCOUNTER — Other Ambulatory Visit: Payer: Self-pay | Admitting: Physician Assistant

## 2024-03-05 ENCOUNTER — Other Ambulatory Visit (HOSPITAL_BASED_OUTPATIENT_CLINIC_OR_DEPARTMENT_OTHER): Payer: Self-pay

## 2024-03-05 DIAGNOSIS — R41840 Attention and concentration deficit: Secondary | ICD-10-CM

## 2024-03-05 DIAGNOSIS — F418 Other specified anxiety disorders: Secondary | ICD-10-CM

## 2024-03-05 MED ORDER — AMPHETAMINE-DEXTROAMPHETAMINE 20 MG PO TABS
20.0000 mg | ORAL_TABLET | Freq: Two times a day (BID) | ORAL | 0 refills | Status: DC
  Filled 2024-03-05: qty 60, 30d supply, fill #0

## 2024-03-05 MED ORDER — LORAZEPAM 1 MG PO TABS
1.0000 mg | ORAL_TABLET | Freq: Every day | ORAL | 1 refills | Status: DC | PRN
  Filled 2024-03-05: qty 30, 30d supply, fill #0

## 2024-03-06 ENCOUNTER — Other Ambulatory Visit (HOSPITAL_BASED_OUTPATIENT_CLINIC_OR_DEPARTMENT_OTHER): Payer: Self-pay

## 2024-03-08 ENCOUNTER — Telehealth: Payer: Self-pay | Admitting: Physician Assistant

## 2024-03-08 ENCOUNTER — Other Ambulatory Visit: Payer: Self-pay

## 2024-03-08 ENCOUNTER — Other Ambulatory Visit (HOSPITAL_BASED_OUTPATIENT_CLINIC_OR_DEPARTMENT_OTHER): Payer: Self-pay

## 2024-03-08 DIAGNOSIS — I10 Essential (primary) hypertension: Secondary | ICD-10-CM

## 2024-03-08 NOTE — Progress Notes (Signed)
 Complex Care Management Note  Care Guide Note 03/08/2024 Name: Michelle Lamb MRN: [id number] DOB: [birth year]  Michelle Lamb is a 34 y.o. year old female who sees Nicholaus Credit, PA-C for primary care. I reached out to Glinda Latifahna Ortmann by phone today to offer complex care management services.  [name] was given information about Complex Care Management services today including:   The Complex Care Management services include support from the care team which includes your Nurse Care Manager, Clinical Social Worker, or Pharmacist.  The Complex Care Management team is here to help remove barriers to the health concerns and goals most important to you. Complex Care Management services are voluntary, and the patient may decline or stop services at any time by request to their care team member.    Follow up plan:  Patient has CHEP and not eligible for services.

## 2024-03-18 ENCOUNTER — Other Ambulatory Visit (HOSPITAL_BASED_OUTPATIENT_CLINIC_OR_DEPARTMENT_OTHER): Payer: Self-pay

## 2024-03-18 ENCOUNTER — Ambulatory Visit: Admitting: Physician Assistant

## 2024-03-18 ENCOUNTER — Other Ambulatory Visit: Payer: Self-pay | Admitting: Physician Assistant

## 2024-03-18 ENCOUNTER — Encounter: Payer: Self-pay | Admitting: Physician Assistant

## 2024-03-18 VITALS — BP 138/90 | HR 86 | Temp 98.0°F | Resp 18 | Ht 63.0 in | Wt 197.3 lb

## 2024-03-18 DIAGNOSIS — E782 Mixed hyperlipidemia: Secondary | ICD-10-CM

## 2024-03-18 DIAGNOSIS — R300 Dysuria: Secondary | ICD-10-CM | POA: Diagnosis not present

## 2024-03-18 DIAGNOSIS — F418 Other specified anxiety disorders: Secondary | ICD-10-CM | POA: Diagnosis not present

## 2024-03-18 DIAGNOSIS — E559 Vitamin D deficiency, unspecified: Secondary | ICD-10-CM | POA: Diagnosis not present

## 2024-03-18 DIAGNOSIS — R41840 Attention and concentration deficit: Secondary | ICD-10-CM | POA: Diagnosis not present

## 2024-03-18 DIAGNOSIS — D513 Other dietary vitamin B12 deficiency anemia: Secondary | ICD-10-CM

## 2024-03-18 DIAGNOSIS — I10 Essential (primary) hypertension: Secondary | ICD-10-CM

## 2024-03-18 DIAGNOSIS — E611 Iron deficiency: Secondary | ICD-10-CM

## 2024-03-18 DIAGNOSIS — E119 Type 2 diabetes mellitus without complications: Secondary | ICD-10-CM | POA: Diagnosis not present

## 2024-03-18 LAB — POCT URINALYSIS DIP (CLINITEK)
Blood, UA: NEGATIVE
Glucose, UA: NEGATIVE mg/dL
Ketones, POC UA: NEGATIVE mg/dL
Leukocytes, UA: NEGATIVE
Nitrite, UA: NEGATIVE
Spec Grav, UA: 1.015
Urobilinogen, UA: 1 U/dL
pH, UA: 6.5

## 2024-03-18 MED ORDER — VENLAFAXINE HCL ER 75 MG PO CP24
225.0000 mg | ORAL_CAPSULE | Freq: Every day | ORAL | 0 refills | Status: AC
  Filled 2024-03-18: qty 90, 30d supply, fill #0

## 2024-03-18 MED ORDER — LOSARTAN POTASSIUM 50 MG PO TABS
50.0000 mg | ORAL_TABLET | Freq: Every day | ORAL | 1 refills | Status: AC
  Filled 2024-03-18: qty 30, 30d supply, fill #0

## 2024-03-18 NOTE — Assessment & Plan Note (Signed)
 Continue supplement Orders:   VITAMIN D  25 Hydroxy (Vit-D Deficiency, Fractures)

## 2024-03-18 NOTE — Assessment & Plan Note (Signed)
" °  Orders:   TSH   Venlafaxine  HCl 225 MG TB24; Take 1 tablet (225 mg total) by mouth daily. ;increased from 150mg   "

## 2024-03-18 NOTE — Progress Notes (Signed)
 "  Subjective:  Patient ID: Michelle Lamb, female    DOB: 08-Jun-1990  Age: 34 y.o. MRN: [id number]  Chief Complaint  Patient presents with   Hypertension    HPI Pt in today for follow up of hypertension.  Blood pressure has continued to range in 130s-140s/upper 80s-90s - she denies chest pain, sob, edema - she is taking losartan  25mg  qd  Pt with anxiety and taking effexor  XR 150mg  qd - she states overall this medication has been working well for her - however in the past several weeks she has been having breakthrough anxiety almost every day  Pt with vit D def - is taking weekly supplement - due for labwork  Pt has history of diabetes but has not been on medication since having bariatric surgery a few years ago.  Recent labwork has shown hyperglycemia but hgb A1c has been stable  Pt with Vit B12 def - currently on monthly supplement and due for labwork  Pt with ADD - stable on adderall 20mg   Pt states that over the past 2 weeks she has been having pain and pressure in her right flank area.  States if she pushes on right flank and right upper abdomen  It feels like she needs to urinate.  She does have known decreased output from that kidney and has had prior surgery on that kidney.  Her last visit with nephrologist was a few months ago and everything was stable.  Renal imaging flow test showed right kidney output slightly lower than left in 8/25 CT abdomen/pelvis in 7/25 showed no stones Pt states she is worried her kidney is starting to fail  Pt with GERD - stable on protonix  40mg  qd     12/21/2023   11:42 AM 10/06/2023   11:11 AM 09/27/2023    9:35 AM 05/29/2023    1:23 PM 10/19/2022    4:23 PM  Depression screen PHQ 2/9  Decreased Interest 2 0 3 2 1   Down, Depressed, Hopeless 3 0 2 2 1   PHQ - 2 Score 5 0 5 4 2   Altered sleeping 3  3 2 2   Tired, decreased energy 2  2 2 1   Change in appetite 1  0 0 1  Feeling bad or failure about yourself  2  1 1    Trouble concentrating  3  2 3  0  Moving slowly or fidgety/restless 0  0 0 0  Suicidal thoughts 1  0 0 0  PHQ-9 Score 17   13  12  6    Difficult doing work/chores Very difficult  Very difficult Very difficult Somewhat difficult     Data saved with a previous flowsheet row definition        11/14/2022   10:55 AM 12/14/2022   12:06 PM 05/29/2023    1:23 PM 10/06/2023   11:11 AM 12/21/2023   11:42 AM  Fall Risk  Falls in the past year? 0 0 0 0 0  Was there an injury with Fall? 0  0  0  0  0   Fall Risk Category Calculator 0 0 0 0 0  Patient at Risk for Falls Due to No Fall Risks No Fall Risks No Fall Risks No Fall Risks No Fall Risks  Fall risk Follow up Falls evaluation completed Falls evaluation completed Falls evaluation completed Falls evaluation completed Falls evaluation completed     Data saved with a previous flowsheet row definition    CONSTITUTIONAL: Negative for chills, fatigue, fever,  E/N/T:  Negative for ear pain, nasal congestion and sore throat.  CARDIOVASCULAR: Negative for chest pain, dizziness, palpitations and pedal edema.  RESPIRATORY: Negative for recent cough and dyspnea.  GASTROINTESTINAL: Negative for abdominal pain, acid reflux symptoms, constipation, diarrhea, nausea and vomiting.  GU - see HPI MSK: Negative for arthralgias and myalgias.  INTEGUMENTARY: Negative for rash.  NEUROLOGICAL: Negative for dizziness and headaches.  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   Blood transfusion without reported diagnosis 2018   postpartum   Chest pain of uncertain  etiology 06/08/2022   06/15/22 Myocardial perfusion - normal, 09/15/22 long term heart monitor in Epic   Chronic kidney disease    right kidney at 35%   Complication of anesthesia    Slow to wake up   Constipation 06/28/2022   Diabetes mellitus without complication (HCC)    NOT AN ISSUE SINCE GASTRIC BYPASS IN 2022. 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  Report date is 02/23/2022.      The CancerNext-Expanded gene panel offered by [facility] and includes sequencing, rearrangement, and RNA analysis for the following 77 genes: AIP, ALK, APC, ATM, AXIN2, BAP1, BARD1, BLM, BMPR1A, BRCA1, BRCA2, BRIP1, CDC73, CDH1, CDK4, CDKN1B, CDKN2A, CHEK2, CTNNA1, DICER1, FANCC, FH, FLCN, GALNT12, KIF1B, LZTR1,   Hepatic steatosis 09/08/2021   History of herpes simplex infection    History of partial nephrectomy 03/21/2022   Formatting of this note might be different from the original. 03/15/2021: Underwent right partial nephrectomy by Dr. Renda for right renal neoplasm. Pathology: Angiomyolipoma. Formatting of this note might be different from the original. 03/15/2021: Underwent right partial nephrectomy by Dr. Renda for right renal neoplasm. Pathology: Angiomyolipoma.   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   Morbid obesity (HCC) 06/02/2020   Neoplasm of right kidney    benign tumor of kidney   Obesity (BMI 30.0-34.9) 06/08/2022   Obstructive sleep apnea 10/29/2019   hx of  before gastric bypass, no longer uses CPAP   Racing heart beat 08/22/2022   see 09/15/22 Long term heart monitor in Epic   Seasonal allergies    Sleep apnea    Ureteral obstruction, right 07/15/2021   Vaginal delivery 2009, 2010, 2015   Vitamin D  insufficiency 09/30/2020   Weight loss 09/30/2020   Objective:  PHYSICAL EXAM:   VS: BP (!) 138/90   Pulse 86   Temp 98 F (36.7 C) (Temporal)   Resp 18   Ht 5' 3 (1.6 m)   Wt 197 lb 4.8 oz (89.5 kg)  LMP 12/11/2022   BMI 34.95 kg/m   GEN: Well nourished, well developed, in no acute distress  Cardiac: RRR; no murmurs, rubs, or gallops,no edema - Respiratory:  normal respiratory rate and pattern with no distress - normal breath sounds with no rales, rhonchi, wheezes or rubs GI: normal bowel sounds, no masses - mild tenderness to ruq and mid stomach and mild flank tenderness MS: no deformity or atrophy  Skin: warm and dry, no rash  Neuro:  Alert and Oriented x 3,  - CN II-Xii grossly intact Psych: euthymic mood, appropriate affect and demeanor    Assessment & Plan Diabetes mellitus without complication (HCC)  Orders:   Microalbumin/Creatinine Ratio, Urine   Fe+CBC/D/Plt+TIBC+Fer+Retic   Hemoglobin A1c  Dysuria  Orders:   POCT URINALYSIS DIP (CLINITEK)  Anxiety with depression  Orders:   TSH   Venlafaxine  HCl 225 MG TB24; Take 1 tablet (225 mg total) by mouth daily. ;increased from 150mg   Benign hypertension  Orders:   losartan  (COZAAR ) 50 MG tablet; Take 1 tablet (50 mg total) by mouth daily.- increased from 25mg   Attention or concentration deficit Continue adderall    Iron  deficiency Labwork pending    Vitamin D  deficiency Continue supplement Orders:   VITAMIN D  25 Hydroxy (Vit-D Deficiency, Fractures)  Mixed hyperlipidemia Decrease fried/fatty foods in diet Orders:   Comprehensive metabolic panel with GFR   Lipid panel  Other dietary vitamin B12 deficiency anemia  Orders:   B12 and Folate  Panel    Follow-up: Return in about 4 weeks (around 04/15/2024) for follow-up.  An After Visit Summary was printed and given to the patient.  SARA R Erandi Lemma, PA-C [facility] [phone]    [1]  Current Outpatient Medications:    losartan  (COZAAR ) 50 MG tablet, Take 1 tablet (50 mg total) by mouth daily., Disp: 30 tablet, Rfl: 1   Venlafaxine  HCl 225 MG TB24, Take 1 tablet (225 mg total) by mouth daily., Disp: 30 tablet, Rfl: 0   albuterol  (VENTOLIN  HFA) 108 (90 Base) MCG/ACT inhaler, Inhale 2 puffs into the lungs every 6 (six) hours as needed for wheezing or shortness of breath., Disp: 1 each, Rfl: 5   amphetamine -dextroamphetamine  (ADDERALL) 20 MG tablet, Take 1 tablet (20 mg total) by mouth 2 (two) times daily., Disp: 60 tablet, Rfl: 0   Continuous Glucose Sensor (FREESTYLE LIBRE 3 SENSOR) MISC, Use every 14 days - apply as directed, Disp: 2 each, Rfl: 5   cyanocobalamin  (VITAMIN B12) 1000 MCG/ML injection, Inject 1 mL (1,000 mcg total) into the muscle every 30 (thirty) days., Disp: 1 mL, Rfl: 2   fluticasone  (FLONASE ) 50 MCG/ACT nasal spray, Place 2 sprays into both nostrils daily., Disp: 16 g, Rfl: 6   linaclotide  (LINZESS ) 290 MCG CAPS capsule, Take one capsule on an empty stomach 30 minutes prior to first meal of the day. Keep medicine in its original container., Disp: 90 capsule, Rfl: 4   LORazepam  (ATIVAN ) 1 MG tablet, Take 1 tablet (1 mg total) by mouth daily as needed., Disp: 30 tablet, Rfl: 1   meclizine  (ANTIVERT ) 25 MG tablet, Take 1 tablet (25 mg total) by mouth 3 (three) times daily as needed for dizziness., Disp: 30 tablet, Rfl: 0   Multiple Vitamin (MULTIVITAMIN WITH MINERALS) TABS tablet, Take 1 tablet by mouth daily., Disp: , Rfl:    ondansetron  (ZOFRAN -ODT) 4 MG disintegrating tablet, Take 1 tablet (4 mg total) by mouth every 8 (eight) hours as needed for nausea or vomiting., Disp: 20  tablet, Rfl: 0   pantoprazole  (PROTONIX ) 40 MG tablet, Take 1 tablet (40 mg  total) by mouth daily., Disp: 30 tablet, Rfl: 11   Sodium Fluoride  (SODIUM FLUORIDE  5000 PPM) 1.1 % PSTE, Apply 1 application to the teeth twice a day; . Floss first, then brush 2 times daily with PreviDent toothpaste. Expectorate. Do not rinse, use mouth rinse, eat, or drink for 30 min after use., Disp: 100 mL, Rfl: 5   Vitamin D , Ergocalciferol , (DRISDOL ) 1.25 MG (50000 UNIT) CAPS capsule, Take 1 capsule (50,000 Units total) by mouth 2 (two) times a week., Disp: 8 capsule, Rfl: 3  "

## 2024-03-18 NOTE — Assessment & Plan Note (Signed)
 Decrease fried/fatty foods in diet Orders:   Comprehensive metabolic panel with GFR   Lipid panel

## 2024-03-18 NOTE — Assessment & Plan Note (Signed)
 Continue adderall

## 2024-03-18 NOTE — Assessment & Plan Note (Signed)
" °  Orders:   Microalbumin/Creatinine Ratio, Urine   Fe+CBC/D/Plt+TIBC+Fer+Retic   Hemoglobin A1c  "

## 2024-03-18 NOTE — Assessment & Plan Note (Signed)
Labwork pending  

## 2024-03-18 NOTE — Assessment & Plan Note (Signed)
  Orders:   B12 and Folate Panel

## 2024-03-19 ENCOUNTER — Ambulatory Visit: Payer: Self-pay | Admitting: Physician Assistant

## 2024-03-19 LAB — FE+CBC/D/PLT+TIBC+FER+RETIC
Basophils Absolute: 0.1 x10E3/uL (ref 0.0–0.2)
Basos: 1 %
EOS (ABSOLUTE): 0.1 x10E3/uL (ref 0.0–0.4)
Eos: 1 %
Ferritin: 25 ng/mL (ref 15–150)
Hematocrit: 43.8 % (ref 34.0–46.6)
Hemoglobin: 14.4 g/dL (ref 11.1–15.9)
Immature Grans (Abs): 0 x10E3/uL (ref 0.0–0.1)
Immature Granulocytes: 0 %
Iron Saturation: 26 % (ref 15–55)
Iron: 103 ug/dL (ref 27–159)
Lymphocytes Absolute: 2.9 x10E3/uL (ref 0.7–3.1)
Lymphs: 27 %
MCH: 29.3 pg (ref 26.6–33.0)
MCHC: 32.9 g/dL (ref 31.5–35.7)
MCV: 89 fL (ref 79–97)
Monocytes Absolute: 0.6 x10E3/uL (ref 0.1–0.9)
Monocytes: 6 %
Neutrophils Absolute: 7.1 x10E3/uL — ABNORMAL HIGH (ref 1.4–7.0)
Neutrophils: 65 %
Platelets: 339 x10E3/uL (ref 150–450)
RBC: 4.91 x10E6/uL (ref 3.77–5.28)
RDW: 13 % (ref 11.7–15.4)
Retic Ct Pct: 1.2 % (ref 0.6–2.6)
Total Iron Binding Capacity: 400 ug/dL (ref 250–450)
UIBC: 297 ug/dL (ref 131–425)
WBC: 10.8 x10E3/uL (ref 3.4–10.8)

## 2024-03-19 LAB — COMPREHENSIVE METABOLIC PANEL WITH GFR
ALT: 20 IU/L (ref 0–32)
AST: 20 IU/L (ref 0–40)
Albumin: 4.7 g/dL (ref 3.9–4.9)
Alkaline Phosphatase: 101 IU/L (ref 41–116)
BUN/Creatinine Ratio: 23 (ref 9–23)
BUN: 13 mg/dL (ref 6–20)
Bilirubin Total: 0.5 mg/dL (ref 0.0–1.2)
CO2: 24 mmol/L (ref 20–29)
Calcium: 9.4 mg/dL (ref 8.7–10.2)
Chloride: 100 mmol/L (ref 96–106)
Creatinine, Ser: 0.57 mg/dL (ref 0.57–1.00)
Globulin, Total: 2.5 g/dL (ref 1.5–4.5)
Glucose: 68 mg/dL — ABNORMAL LOW (ref 70–99)
Potassium: 4.1 mmol/L (ref 3.5–5.2)
Sodium: 139 mmol/L (ref 134–144)
Total Protein: 7.2 g/dL (ref 6.0–8.5)
eGFR: 121 mL/min/1.73

## 2024-03-19 LAB — LIPID PANEL
Chol/HDL Ratio: 2.3 ratio (ref 0.0–4.4)
Cholesterol, Total: 230 mg/dL — ABNORMAL HIGH (ref 100–199)
HDL: 99 mg/dL
LDL Chol Calc (NIH): 119 mg/dL — ABNORMAL HIGH (ref 0–99)
Triglycerides: 69 mg/dL (ref 0–149)
VLDL Cholesterol Cal: 12 mg/dL (ref 5–40)

## 2024-03-19 LAB — VITAMIN D 25 HYDROXY (VIT D DEFICIENCY, FRACTURES): Vit D, 25-Hydroxy: 20 ng/mL — ABNORMAL LOW (ref 30.0–100.0)

## 2024-03-19 LAB — HEMOGLOBIN A1C
Est. average glucose Bld gHb Est-mCnc: 97 mg/dL
Hgb A1c MFr Bld: 5 % (ref 4.8–5.6)

## 2024-03-19 LAB — MICROALBUMIN / CREATININE URINE RATIO
Creatinine, Urine: 164.6 mg/dL
Microalb/Creat Ratio: 7 mg/g{creat} (ref 0–29)
Microalbumin, Urine: 11.1 ug/mL

## 2024-03-19 LAB — TSH: TSH: 0.449 u[IU]/mL — ABNORMAL LOW (ref 0.450–4.500)

## 2024-03-19 LAB — B12 AND FOLATE PANEL
Folate: 6.2 ng/mL
Vitamin B-12: 449 pg/mL (ref 232–1245)

## 2024-03-22 ENCOUNTER — Encounter: Payer: Self-pay | Admitting: Family Medicine

## 2024-03-22 ENCOUNTER — Ambulatory Visit: Admitting: Family Medicine

## 2024-03-22 ENCOUNTER — Other Ambulatory Visit (HOSPITAL_BASED_OUTPATIENT_CLINIC_OR_DEPARTMENT_OTHER): Payer: Self-pay

## 2024-03-22 VITALS — BP 128/84 | HR 93 | Temp 97.8°F | Resp 18 | Ht 63.0 in | Wt 198.0 lb

## 2024-03-22 DIAGNOSIS — M5441 Lumbago with sciatica, right side: Secondary | ICD-10-CM | POA: Diagnosis not present

## 2024-03-22 DIAGNOSIS — R1031 Right lower quadrant pain: Secondary | ICD-10-CM | POA: Diagnosis not present

## 2024-03-22 DIAGNOSIS — R3915 Urgency of urination: Secondary | ICD-10-CM | POA: Diagnosis not present

## 2024-03-22 DIAGNOSIS — G8929 Other chronic pain: Secondary | ICD-10-CM

## 2024-03-22 DIAGNOSIS — R102 Pelvic and perineal pain unspecified side: Secondary | ICD-10-CM

## 2024-03-22 LAB — POCT URINALYSIS DIP (CLINITEK)
Blood, UA: NEGATIVE
Glucose, UA: NEGATIVE mg/dL
Ketones, POC UA: NEGATIVE mg/dL
Leukocytes, UA: NEGATIVE
Nitrite, UA: NEGATIVE
POC PROTEIN,UA: NEGATIVE
Spec Grav, UA: 1.02
Urobilinogen, UA: 0.2 U/dL
pH, UA: 6

## 2024-03-22 MED ORDER — HYDROCODONE-ACETAMINOPHEN 10-325 MG PO TABS
1.0000 | ORAL_TABLET | Freq: Three times a day (TID) | ORAL | 0 refills | Status: AC | PRN
  Filled 2024-03-22: qty 15, 5d supply, fill #0

## 2024-03-22 MED ORDER — NITROFURANTOIN MONOHYD MACRO 100 MG PO CAPS
100.0000 mg | ORAL_CAPSULE | Freq: Two times a day (BID) | ORAL | 0 refills | Status: DC
  Filled 2024-03-22: qty 14, 7d supply, fill #0

## 2024-03-22 NOTE — Progress Notes (Signed)
 "  Acute Office Visit  Subjective:    Patient ID: Michelle Lamb, female    DOB: 1990-10-02, 34 y.o.   MRN: [id number]  Chief Complaint  Patient presents with   Urinary Tract Infection    Discussed the use of AI scribe software for clinical note transcription with the patient, who gave verbal consent to proceed.  History of Present Illness   Michelle Lamb is a 34 year old female who presents with abdominal pressure and urinary symptoms.  Abdominal pressure and urinary symptoms - Abdominal pressure, most pronounced at night and when the bladder is full - Pressure partially relieved by urination, but persistent pain remains, especially on the right side - No dysuria or burning with urination - History of right-sided kidney surgery - Previous urine test was negative for infection - Significant anxiety regarding kidney health due to solitary kidney  Constipation and pelvic prolapse - Chronic constipation with infrequent bowel movements, sometimes going weeks without a bowel movement - Requires manual assistance for defecation - Pelvic prolapse with sensation of weak pelvic muscles - Constipation and prolapse contribute to significant pelvic and back pressure  Chronic back pain - Chronic back pain, worsened by constipation and pelvic pressure - Pain localized to the back, associated with abdominal and pelvic symptoms  Antibiotic allergies and history - Allergy to sulfa  antibiotics, including Bactrim  - History of Bactrim  use for urinary tract infections - Tolerated Macrobid  in the past without adverse effects  Renal health concerns - History of kidney surgery, currently has only one kidney - Previous CT scan and kidney flow test performed, results unknown - Expresses ongoing concern and anxiety about renal function   Past Medical History:  Diagnosis Date   Abnormal menses 08/22/2022   Absolute anemia 10/12/2021   Acute cystitis with hematuria 12/22/2020    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   Blood transfusion without reported diagnosis 2018   postpartum   Chest pain of uncertain etiology 06/08/2022   06/15/22 Myocardial perfusion - normal, 09/15/22 long term heart monitor in Epic   Chronic kidney disease    right kidney at 35%   Complication of anesthesia    Slow to wake up   Constipation 06/28/2022   Diabetes mellitus without complication (HCC)    NOT AN ISSUE SINCE GASTRIC BYPASS IN 2022. 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  Report date is 02/23/2022.      The CancerNext-Expanded gene panel offered by [facility] and includes sequencing, rearrangement, and RNA analysis for the following 77 genes: AIP, ALK, APC, ATM, AXIN2, BAP1, BARD1, BLM, BMPR1A, BRCA1, BRCA2, BRIP1, CDC73, CDH1, CDK4, CDKN1B, CDKN2A, CHEK2, CTNNA1, DICER1, FANCC, FH, FLCN, GALNT12, KIF1B, LZTR1,   Hepatic steatosis 09/08/2021   History of herpes simplex infection    History of partial nephrectomy 03/21/2022   Formatting of this note might be different from the original. 03/15/2021: Underwent right partial nephrectomy by Dr. Renda for right renal neoplasm. Pathology: Angiomyolipoma. Formatting of this note might be different from the original. 03/15/2021: Underwent right partial nephrectomy by Dr. Renda for right renal  neoplasm. Pathology: Angiomyolipoma.   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   Morbid obesity (HCC) 06/02/2020   Neoplasm of right kidney    benign tumor of kidney   Obesity (BMI 30.0-34.9) 06/08/2022   Obstructive sleep apnea 10/29/2019   hx of before gastric bypass, no longer uses CPAP   Racing heart beat 08/22/2022   see 09/15/22 Long term heart monitor in Epic   Seasonal allergies    Sleep apnea    Ureteral obstruction, right 07/15/2021   Vaginal delivery 2009, 2010, 2015   Vitamin D  insufficiency 09/30/2020   Weight loss 09/30/2020    Past Surgical History:  Procedure Laterality Date   ABDOMINAL HYSTERECTOMY  2024   APPENDECTOMY     CESAREAN SECTION  09/29/2016   CHOLECYSTECTOMY     COLONOSCOPY WITH ESOPHAGOGASTRODUODENOSCOPY (EGD)  07/22/2022   CYSTOSCOPY N/A 12/27/2022   Procedure: CYSTOSCOPY;  Surgeon: Glennon Almarie POUR, MD;  Location: [facility];  Service: Gynecology;  Laterality: N/A;   CYSTOSCOPY W/ URETERAL STENT PLACEMENT Right 07/15/2021   Procedure: CYSTOSCOPY WITH RETROGRADE PYELOGRAM/URETERAL STENT PLACEMENT;  Surgeon: Renda Glance, MD;  Location: [facility];  Service: Urology;  Laterality: Right;   CYSTOSCOPY WITH RETROGRADE PYELOGRAM, URETEROSCOPY AND STENT PLACEMENT Right 05/03/2021   Procedure: CYSTOSCOPY WITH RIGHT RETROGRADE PYELOGRAM, URETEROSCOPY;  Surgeon: Renda Glance, MD;  Location: [facility];  Service: Urology;  Laterality: Right;   DILATION AND CURETTAGE OF UTERUS N/A 12/31/2015   Procedure: SUCTION DILATATION AND CURETTAGE;  Surgeon: Bebe Furry, MD;  Location: [facility];  Service: Gynecology;  Laterality: N/A;   DILATION AND EVACUATION N/A 01/01/2016   Procedure: DILATATION AND EVACUATION;  Surgeon: Norleen LULLA Server, MD;  Location: [facility];  Service: Gynecology;  Laterality: N/A;   ESOPHAGOGASTRODUODENOSCOPY      GASTRIC BYPASS  06/02/2020   IR NEPHRO TUBE REMOV/FL  07/16/2021   IR NEPHROSTOMY EXCHANGE RIGHT  05/12/2021   IR NEPHROSTOMY PLACEMENT RIGHT  05/05/2021   PANNICULECTOMY N/A 08/25/2023   Procedure: PANNICULECTOMY;  Surgeon: Waddell Leonce NOVAK, MD;  Location: [facility];  Service: Plastics;  Laterality: N/A;   ROBOT ASSISTED PYELOPLASTY Right 07/15/2021   Procedure: XI ROBOTIC ASSISTED  LAPAROSCOPIC PYELOPLASTY;  Surgeon: Renda Glance, MD;  Location: [facility];  Service: Urology;  Laterality: Right;   ROBOTIC ASSISTED LAPAROSCOPIC HYSTERECTOMY AND SALPINGECTOMY Bilateral 12/27/2022   Procedure: XI ROBOTIC ASSISTED LAPAROSCOPIC HYSTERECTOMY AND SALPINGECTOMY;  Surgeon: Glennon Almarie POUR, MD;  Location: [facility];  Service: Gynecology;  Laterality: Bilateral;   ROBOTIC ASSITED PARTIAL NEPHRECTOMY Right 03/15/2021   Procedure: XI ROBOTIC ASSITED PARTIAL NEPHRECTOMY;  Surgeon: Renda Glance, MD;  Location: [facility];  Service: Urology;  Laterality: Right;   TONSILLECTOMY     TUBAL LIGATION     UPPER GASTROINTESTINAL ENDOSCOPY     w/dilation    Family History  Problem Relation Age of Onset   Hypertension Mother    Thyroid  disease Mother    Cervical cancer Mother        dx 88s   Depression Mother    Diabetes Mother    Hyperlipidemia Mother  Anxiety disorder Mother    Asthma Father    Stomach cancer Maternal Grandmother        dx 18s   Leukemia Maternal Grandfather        d. 15s   Depression Paternal Grandmother    Diabetes Paternal Grandmother    Cancer Paternal Grandfather        dx after 24; mets   Breast cancer Maternal Great-grandmother        dx <50; mets; MGF's mother   Stomach cancer Other        MGM's brother; dx after 27   Cancer Other        MGF's sisters x3; unknown primary   ADD / ADHD Daughter    Colon cancer Neg Hx    Colon polyps Neg Hx    Esophageal cancer Neg Hx    Rectal cancer Neg Hx     Social History   Socioeconomic  History   Marital status: Married    Spouse name: Not on file   Number of children: 9   Years of education: 13   Highest education level: Associate degree: academic program  Occupational History   Occupation: Scientist, Forensic  Tobacco Use   Smoking status: Never   Smokeless tobacco: Never  Vaping Use   Vaping status: Never Used  Substance and Sexual Activity   Alcohol use: Yes    Comment: occasionally   Drug use: Never   Sexual activity: Not Currently    Partners: Male    Birth control/protection: Surgical    Comment: tubal, hysterectomy  Other Topics Concern   Not on file  Social History Narrative   Not on file   Social Drivers of Health   Tobacco Use: Low Risk (03/22/2024)   Patient History    Smoking Tobacco Use: Never    Smokeless Tobacco Use: Never    Passive Exposure: Not on file  Financial Resource Strain: Low Risk (12/21/2023)   Overall Financial Resource Strain (CARDIA)    Difficulty of Paying Living Expenses: Not very hard  Food Insecurity: No Food Insecurity (12/21/2023)   Epic    Worried About Radiation Protection Practitioner of Food in the Last Year: Never true    Ran Out of Food in the Last Year: Never true  Transportation Needs: No Transportation Needs (12/21/2023)   Epic    Lack of Transportation (Medical): No    Lack of Transportation (Non-Medical): No  Physical Activity: Insufficiently Active (12/21/2023)   Exercise Vital Sign    Days of Exercise per Week: 3 days    Minutes of Exercise per Session: 20 min  Stress: Stress Concern Present (12/21/2023)   [employer] of Occupational Health - Occupational Stress Questionnaire    Feeling of Stress: To some extent  Social Connections: Moderately Integrated (12/21/2023)   Social Connection and Isolation Panel    Frequency of Communication with Friends and Family: Patient declined    Frequency of Social Gatherings with Friends and Family: Three times a week    Attends Religious Services: 1 to 4 times per  year    Active Member of Clubs or Organizations: No    Attends Banker Meetings: Not on file    Marital Status: Married  Intimate Partner Violence: Not At Risk (09/29/2023)   Epic    Fear of Current or Ex-Partner: No    Emotionally Abused: No    Physically Abused: No    Sexually Abused: No  Depression (PHQ2-9): High Risk (12/21/2023)   Depression (  PHQ2-9)    PHQ-2 Score: 17  Alcohol Screen: Low Risk (10/06/2023)   Alcohol Screen    Last Alcohol Screening Score (AUDIT): 1  Housing: Low Risk (12/21/2023)   Epic    Unable to Pay for Housing in the Last Year: No    Number of Times Moved in the Last Year: 1    Homeless in the Last Year: No  Utilities: Not At Risk (09/29/2023)   Epic    Threatened with loss of utilities: No  Health Literacy: Adequate Health Literacy (04/07/2023)   B1300 Health Literacy    Frequency of need for help with medical instructions: Never    Outpatient Medications Prior to Visit  Medication Sig Dispense Refill   albuterol  (VENTOLIN  HFA) 108 (90 Base) MCG/ACT inhaler Inhale 2 puffs into the lungs every 6 (six) hours as needed for wheezing or shortness of breath. 1 each 5   amphetamine -dextroamphetamine  (ADDERALL) 20 MG tablet Take 1 tablet (20 mg total) by mouth 2 (two) times daily. 60 tablet 0   Continuous Glucose Sensor (FREESTYLE LIBRE 3 SENSOR) MISC Use every 14 days - apply as directed 2 each 5   cyanocobalamin  (VITAMIN B12) 1000 MCG/ML injection Inject 1 mL (1,000 mcg total) into the muscle every 30 (thirty) days. 1 mL 2   fluticasone  (FLONASE ) 50 MCG/ACT nasal spray Place 2 sprays into both nostrils daily. 16 g 6   linaclotide  (LINZESS ) 290 MCG CAPS capsule Take one capsule on an empty stomach 30 minutes prior to first meal of the day. Keep medicine in its original container. 90 capsule 4   LORazepam  (ATIVAN ) 1 MG tablet Take 1 tablet (1 mg total) by mouth daily as needed. 30 tablet 1   losartan  (COZAAR ) 50 MG tablet Take 1 tablet (50 mg total) by  mouth daily. 30 tablet 1   meclizine  (ANTIVERT ) 25 MG tablet Take 1 tablet (25 mg total) by mouth 3 (three) times daily as needed for dizziness. 30 tablet 0   Multiple Vitamin (MULTIVITAMIN WITH MINERALS) TABS tablet Take 1 tablet by mouth daily.     ondansetron  (ZOFRAN -ODT) 4 MG disintegrating tablet Take 1 tablet (4 mg total) by mouth every 8 (eight) hours as needed for nausea or vomiting. 20 tablet 0   pantoprazole  (PROTONIX ) 40 MG tablet Take 1 tablet (40 mg total) by mouth daily. 30 tablet 11   Sodium Fluoride  (SODIUM FLUORIDE  5000 PPM) 1.1 % PSTE Apply 1 application to the teeth twice a day; . Floss first, then brush 2 times daily with PreviDent toothpaste. Expectorate. Do not rinse, use mouth rinse, eat, or drink for 30 min after use. 100 mL 5   venlafaxine  XR (EFFEXOR -XR) 75 MG 24 hr capsule Take 3 capsules (225 mg total) by mouth daily. 90 capsule 0   Vitamin D , Ergocalciferol , (DRISDOL ) 1.25 MG (50000 UNIT) CAPS capsule Take 1 capsule (50,000 Units total) by mouth 2 (two) times a week. 8 capsule 3   No facility-administered medications prior to visit.    Allergies[1]  Review of Systems  Constitutional:  Negative for chills, diaphoresis, fatigue and fever.  HENT:  Negative for congestion, ear pain and sinus pain.   Respiratory:  Negative for cough and shortness of breath.   Cardiovascular:  Negative for chest pain.  Gastrointestinal:  Positive for abdominal pain (RLQ) and constipation. Negative for nausea and vomiting.  Genitourinary:  Positive for pelvic pain and urgency. Negative for dysuria.  Musculoskeletal:  Positive for back pain. Negative for arthralgias.  Neurological:  Negative  for weakness and headaches.  Psychiatric/Behavioral:  Negative for dysphoric mood. The patient is not nervous/anxious.        Objective:        03/22/2024    9:39 AM 03/18/2024   10:03 AM 02/27/2024   10:08 AM  Vitals with BMI  Height 5' 3 5' 3   Weight 198 lbs 197 lbs 5 oz   BMI 35.08  34.96   Systolic 128 138 871  Diastolic 84 90 92  Pulse 93 86     No data found.   Physical Exam Vitals reviewed.  Constitutional:      General: She is not in acute distress.    Appearance: Normal appearance. She is obese. She is not ill-appearing.  HENT:     Nose: Rhinorrhea present. No congestion.  Eyes:     Conjunctiva/sclera: Conjunctivae normal.  Cardiovascular:     Rate and Rhythm: Normal rate and regular rhythm.     Heart sounds: Normal heart sounds. No murmur heard. Pulmonary:     Effort: Pulmonary effort is normal. No respiratory distress.     Breath sounds: Normal breath sounds.  Abdominal:     Palpations: Abdomen is soft.     Tenderness: There is abdominal tenderness (RLQ, suprapubic).  Musculoskeletal:     Lumbar back: Tenderness present.  Skin:    General: Skin is warm.  Neurological:     Mental Status: She is alert and oriented to person, place, and time. Mental status is at baseline.  Psychiatric:        Mood and Affect: Mood normal.        Behavior: Behavior normal.     Health Maintenance Due  Topic Date Due   OPHTHALMOLOGY EXAM  09/16/2023    There are no preventive care reminders to display for this patient.   Lab Results  Component Value Date   TSH 0.449 (L) 03/18/2024   Lab Results  Component Value Date   WBC 10.8 03/18/2024   HGB 14.4 03/18/2024   HCT 43.8 03/18/2024   MCV 89 03/18/2024   PLT 339 03/18/2024   Lab Results  Component Value Date   NA 139 03/18/2024   K 4.1 03/18/2024   CO2 24 03/18/2024   GLUCOSE 68 (L) 03/18/2024   BUN 13 03/18/2024   CREATININE 0.57 03/18/2024   BILITOT 0.5 03/18/2024   ALKPHOS 101 03/18/2024   AST 20 03/18/2024   ALT 20 03/18/2024   PROT 7.2 03/18/2024   ALBUMIN 4.7 03/18/2024   CALCIUM 9.4 03/18/2024   ANIONGAP 6 09/30/2023   EGFR 121 03/18/2024   Lab Results  Component Value Date   CHOL 230 (H) 03/18/2024   Lab Results  Component Value Date   HDL 99 03/18/2024   Lab Results   Component Value Date   LDLCALC 119 (H) 03/18/2024   Lab Results  Component Value Date   TRIG 69 03/18/2024   Lab Results  Component Value Date   CHOLHDL 2.3 03/18/2024   Lab Results  Component Value Date   HGBA1C 5.0 03/18/2024        Results for orders placed or performed in visit on 03/22/24  POCT URINALYSIS DIP (CLINITEK)   Collection Time: 03/22/24 10:51 AM  Result Value Ref Range   Color, UA yellow yellow   Clarity, UA clear clear   Glucose, UA negative negative mg/dL   Bilirubin, UA small (A) negative   Ketones, POC UA negative negative mg/dL   Spec Grav, UA 8.979 8.989 -  1.025   Blood, UA negative negative   pH, UA 6.0 5.0 - 8.0   POC PROTEIN,UA negative negative, trace   Urobilinogen, UA 0.2 0.2 or 1.0 E.U./dL   Nitrite, UA Negative Negative   Leukocytes, UA Negative Negative     Assessment & Plan:   Assessment & Plan Pelvic pressure in female Pelvic organ prolapse with pelvic floor dysfunction and constipation Chronic prolapse with dysfunction causing severe constipation. GI specialist recommended surgery due to severity. Significant discomfort and pressure noted. - Appointment with urologist/gynecologist scheduled for this Monday rescheduled for March for further evaluation and management. - Discussed potential surgical intervention with GI specialist. Orders:   nitrofurantoin , macrocrystal-monohydrate, (MACROBID ) 100 MG capsule; Take 1 capsule (100 mg total) by mouth 2 (two) times daily for 7 days.   POCT URINALYSIS DIP (CLINITEK)   Urine Culture  RLQ abdominal pain Differentials include constipation, UTI, pelvic pressure, kidney infection - Sent antibiotic treatment based on current symptoms Orders:   nitrofurantoin , macrocrystal-monohydrate, (MACROBID ) 100 MG capsule; Take 1 capsule (100 mg total) by mouth 2 (two) times daily for 7 days.  Urinary urgency Urinary urgency and pelvic pain Lab Results  Component Value Date   COLORU yellow  03/22/2024   CLARITYU clear 03/22/2024   GLUCOSEUR negative 03/22/2024   BILIRUBINUR small (A) 03/22/2024   KETONESU Negative 12/20/2023   SPECGRAV 1.020 03/22/2024   RBCUR negative 03/22/2024   PHUR 6.0 03/22/2024   PROTEINUR Negative 12/20/2023   UROBILINOGEN 0.2 03/22/2024   LEUKOCYTESUR Negative 03/22/2024  Persistent abdominal pressure and right-sided kidney pain, no dysuria.  - Sent urine sample for culture.  Orders:   nitrofurantoin , macrocrystal-monohydrate, (MACROBID ) 100 MG capsule; Take 1 capsule (100 mg total) by mouth 2 (two) times daily for 7 days.   POCT URINALYSIS DIP (CLINITEK)   Urine Culture  Chronic bilateral low back pain with right-sided sciatica Chronic low back pain with right-sided sciatica Chronic pain exacerbated by constipation and prolapse, impacting daily activities. - Prescribed pain medication, one tablet Q 8 hours as needed for pain relief. Orders:   HYDROcodone -acetaminophen  (NORCO) 10-325 MG tablet; Take 1 tablet by mouth every 8 (eight) hours as needed for up to 5 days.     Follow-up: Return if symptoms worsen or fail to improve.  An After Visit Summary was printed and given to the patient.  Harrie Cedar, FNP [facility] [phone]      [1]  Allergies Allergen Reactions   Bactrim  [Sulfamethoxazole -Trimethoprim ] Hives   Dilaudid  [Hydromorphone ] Rash   Sulfa  Antibiotics Rash   "

## 2024-03-22 NOTE — Assessment & Plan Note (Addendum)
 Chronic low back pain with right-sided sciatica Chronic pain exacerbated by constipation and prolapse, impacting daily activities. - Prescribed pain medication, one tablet Q 8 hours as needed for pain relief. Orders:   HYDROcodone -acetaminophen  (NORCO) 10-325 MG tablet; Take 1 tablet by mouth every 8 (eight) hours as needed for up to 5 days.

## 2024-03-25 ENCOUNTER — Ambulatory Visit: Admitting: Obstetrics

## 2024-03-28 ENCOUNTER — Encounter: Payer: Self-pay | Admitting: Obstetrics and Gynecology

## 2024-03-28 ENCOUNTER — Ambulatory Visit: Admitting: Obstetrics and Gynecology

## 2024-03-28 VITALS — BP 130/83 | HR 86 | Ht 63.0 in | Wt 197.4 lb

## 2024-03-28 DIAGNOSIS — R300 Dysuria: Secondary | ICD-10-CM | POA: Diagnosis not present

## 2024-03-28 DIAGNOSIS — K5904 Chronic idiopathic constipation: Secondary | ICD-10-CM

## 2024-03-28 DIAGNOSIS — N816 Rectocele: Secondary | ICD-10-CM

## 2024-03-28 LAB — POCT URINALYSIS DIP (CLINITEK)
Bilirubin, UA: NEGATIVE
Blood, UA: NEGATIVE
Glucose, UA: NEGATIVE mg/dL
Ketones, POC UA: NEGATIVE mg/dL
Leukocytes, UA: NEGATIVE
Nitrite, UA: NEGATIVE
POC PROTEIN,UA: NEGATIVE
Spec Grav, UA: 1.025
Urobilinogen, UA: 1 U/dL
pH, UA: 7

## 2024-03-28 NOTE — Progress Notes (Signed)
 " New Patient Evaluation and Consultation  Referring Provider: Glennon Almarie POUR, MD PCP: Nicholaus Credit, PA-C Date of Service: 03/28/2024  SUBJECTIVE Chief Complaint: New Patient (Initial Visit) Michelle Lamb is a 34 y.o. female is here for pelvic prolapse.)  History of Present Illness: Michelle Lamb is a 34 y.o. Black or African-American female seen in consultation at the request of Dr Glennon for evaluation of prolapse.    Urinary Symptoms: Does not leak urine.   Day time voids 2-3.  Nocturia: 1 times per night to void. Voiding dysfunction:  empties bladder well.  Patient does not use a catheter to empty bladder.  When urinating, patient feels she has no difficulties   UTIs: a lot of UTI's in the last year.   Denies history of blood in urine and kidney or bladder stones   Pelvic Organ Prolapse Symptoms:                  Patient Denies a feeling of a bulge the vaginal area unless she has to have a bowel movement then can feel one when splinting.   Bowel Symptom: Bowel movements: several time(s) per week Stool consistency: soft , looser with linzess  Straining: yes.  Splinting: yes- has to splint in order to get the stool out  Incomplete evacuation: yes.  Patient Denies accidental bowel leakage / fecal incontinence Bowel regimen: diet, fiber, and stool softener, Linzess    Pelvic MRI 12/12/23:  FINDINGS:   Levator complex/Sphincters : Appears intact, with no evidence of significant atrophy..   Hiatal enlargement (H-line):  Rest 7.1 cm (Mild)  Strain 8.0 cm (Mild)  Evacuation 12.4 cm (Severe)   Pelvic floor descent (M-line):  Rest 3.1 cm (Mild)  Strain 3.6 cm (Mild)  Evacuation 7.1 cm (Severe)   Pelvic organ prolapse (relative to pubococcygeal line):  Anterior compartment: Mild cystocele.  Middle compartment: Mild to moderate descent of the vaginal cuff. Mild enterocele/peritoneocele.  Posterior compartment: Large anterior rectocele.   Intussusception: None identified.  Urethral rotation: 95 degrees during maximum effort, compatible with hypermobility.   Defecatory function:  Anorectal angle (degrees): Rest 119, Squeeze 87, Strain 126, Evacuation 140  Diminished appearance of expected puborectalis impression upon the rectal wall.   The patient was unable to evacuate the majority of instilled contrast media after 3 attempts.   Other pelvic findings:  . Uterus: Hysterectomy.  . Adnexa: Left ovarian cyst measuring 3.1 cm. Unremarkable right ovary.  . Lymph nodes: Unremarkable.  . Bowel: Unremarkable.  . MSK: Prior lower abdominal wall surgical changes.   Sexual Function Sexually active: yes.  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   Blood transfusion without reported diagnosis 2018   postpartum   Chest pain of uncertain etiology 06/08/2022   06/15/22 Myocardial perfusion - normal, 09/15/22 long  term heart monitor in Epic   Chronic kidney disease    right kidney at 35%   Complication of anesthesia    Slow to wake up   Constipation 06/28/2022   Diabetes mellitus without complication (HCC)    NOT AN ISSUE SINCE GASTRIC BYPASS IN 2022. 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  Report date is 02/23/2022.      The CancerNext-Expanded gene panel offered by [facility] and includes sequencing, rearrangement, and RNA analysis for the following 77 genes: AIP, ALK, APC, ATM, AXIN2, BAP1, BARD1, BLM, BMPR1A, BRCA1, BRCA2, BRIP1, CDC73, CDH1, CDK4, CDKN1B, CDKN2A, CHEK2, CTNNA1, DICER1, FANCC, FH, FLCN, GALNT12, KIF1B, LZTR1,   Hepatic steatosis 09/08/2021   History of herpes simplex infection    History of partial nephrectomy 03/21/2022   Formatting of this note might be different from the original. 03/15/2021: Underwent right partial nephrectomy by Dr. Renda for right renal neoplasm. Pathology: Angiomyolipoma. Formatting of this note might be different from the original. 03/15/2021: Underwent right partial nephrectomy by Dr. Renda for right renal neoplasm. Pathology: Angiomyolipoma.   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   Morbid obesity (HCC) 06/02/2020   Neoplasm of right kidney    benign tumor of kidney   Obesity (BMI 30.0-34.9) 06/08/2022   Obstructive sleep apnea 10/29/2019   hx of before gastric bypass, no longer uses CPAP   Racing heart beat 08/22/2022   see 09/15/22 Long term heart monitor in Epic   Seasonal allergies    Sleep apnea    Ureteral obstruction, right 07/15/2021   Vaginal delivery 2009, 2010, 2015   Vitamin D  insufficiency 09/30/2020   Weight loss 09/30/2020     Past Surgical History:   Past Surgical History:  Procedure Laterality Date   APPENDECTOMY     CESAREAN SECTION   09/29/2016   CHOLECYSTECTOMY     COLONOSCOPY WITH ESOPHAGOGASTRODUODENOSCOPY (EGD)  07/22/2022   CYSTOSCOPY N/A 12/27/2022   Procedure: CYSTOSCOPY;  Surgeon: Glennon Almarie POUR, MD;  Location[facility];  Service: Gynecology;  Laterality: N/A;   CYSTOSCOPY W/ URETERAL STENT PLACEMENT Right 07/15/2021   Procedure: CYSTOSCOPY WITH RETROGRADE PYELOGRAM/URETERAL STENT PLACEMENT;  Surgeon: Renda Glance, MD;  Location: [facility];  Service: Urology;  Laterality: Right;   CYSTOSCOPY WITH RETROGRADE PYELOGRAM, URETEROSCOPY AND STENT PLACEMENT Right 05/03/2021   Procedure: CYSTOSCOPY WITH RIGHT RETROGRADE PYELOGRAM, URETEROSCOPY;  Surgeon: Renda Glance, MD;  Location: [facility];  Service: Urology;  Laterality: Right;   DILATION AND CURETTAGE OF UTERUS N/A 12/31/2015   Procedure: SUCTION DILATATION AND CURETTAGE;  Surgeon: Bebe Furry, MD;  Location: [facility];  Service: Gynecology;  Laterality: N/A;   DILATION AND EVACUATION N/A 01/01/2016   Procedure: DILATATION AND EVACUATION;  Surgeon: Norleen LULLA Server, MD;  Location: [facility];  Service: Gynecology;  Laterality: N/A;   ESOPHAGOGASTRODUODENOSCOPY     GASTRIC BYPASS  06/02/2020   IR NEPHRO TUBE REMOV/FL  07/16/2021   IR NEPHROSTOMY EXCHANGE RIGHT  05/12/2021   IR NEPHROSTOMY PLACEMENT RIGHT  05/05/2021   PANNICULECTOMY N/A 08/25/2023   Procedure: PANNICULECTOMY;  Surgeon: Waddell Leonce NOVAK, MD;  Location: [facility];  Service: Plastics;  Laterality: N/A;   ROBOT ASSISTED PYELOPLASTY Right 07/15/2021   Procedure: XI ROBOTIC ASSISTED  LAPAROSCOPIC PYELOPLASTY;  Surgeon: Renda Glance, MD;  Location: [facility];  Service: Urology;  Laterality: Right;   ROBOTIC ASSISTED LAPAROSCOPIC HYSTERECTOMY AND SALPINGECTOMY Bilateral 12/27/2022   Procedure: XI ROBOTIC ASSISTED LAPAROSCOPIC HYSTERECTOMY AND SALPINGECTOMY;  Surgeon: Glennon Almarie POUR, MD;  Location: [facility];  Service: Gynecology;  Laterality: Bilateral;    ROBOTIC ASSITED PARTIAL NEPHRECTOMY Right 03/15/2021   Procedure: XI ROBOTIC ASSITED PARTIAL NEPHRECTOMY;  Surgeon: Renda Glance, MD;  Location: [facility];  Service: Urology;  Laterality: Right;   TONSILLECTOMY     TUBAL LIGATION     UPPER GASTROINTESTINAL ENDOSCOPY     w/dilation     Past OB/GYN History: OB History  Gravida Para Term Preterm AB Living  6 4 3 1 2 4   SAB IAB Ectopic Multiple Live Births  2    4    # Outcome Date GA Lbr Len/2nd Weight Sex Type Anes PTL Lv  6 Term 02/12/14 [redacted]w[redacted]d  6 lb 12 oz (3.062 kg) F Vag-Spont EPI N LIV  5 SAB 2015          4 Term 08/06/08 [redacted]w[redacted]d  6 lb 5 oz (2.863 kg) F Vag-Spont EPI N LIV  3 Term 04/02/07 [redacted]w[redacted]d  5 lb 5 oz (2.41 kg) F Vag-Spont EPI Y LIV     Complications: Preeclampsia, Diabetes mellitus complicating pregnancy  2 SAB           1 Preterm      CS-Unspec       Obstetric Comments  H/o GDM and HTN in her last pregnancy   She reports a significant tear into the rectum with her first delivery.  S/p hysterectomy  Medications: Patient has a current medication list which includes the following prescription(s): albuterol , amphetamine -dextroamphetamine , freestyle libre 3 sensor, cyanocobalamin , fluticasone , linzess , lorazepam , losartan , meclizine , multivitamin with minerals, nitrofurantoin  (macrocrystal-monohydrate), ondansetron , pantoprazole , sodium fluoride  5000 ppm, venlafaxine  xr, and vitamin d  (ergocalciferol ).   Allergies: Patient is allergic to bactrim  [sulfamethoxazole -trimethoprim ], dilaudid  [hydromorphone ], and sulfa  antibiotics.   Social History: Social History[1]  Relationship status: married Patient lives with her family.   Patient is employed as a CLINICAL BIOCHEMIST. Regular exercise: Yes:   History of abuse: No  Family History:   Family History  Problem Relation Age of Onset   Hypertension Mother    Thyroid  disease Mother    Cervical cancer Mother        dx 30s   Depression Mother    Diabetes Mother    Hyperlipidemia Mother     Anxiety disorder Mother    Asthma Father    Stomach cancer Maternal Grandmother        dx 9s   Leukemia Maternal Grandfather        d. 29s   Depression Paternal Grandmother    Diabetes Paternal Grandmother    Cancer Paternal Grandfather        dx after 67; mets   Breast cancer Maternal Great-grandmother        dx <50; mets; MGF's mother   Stomach cancer Other        MGM's brother; dx after 78   Cancer Other        MGF's sisters x3; unknown primary  ADD / ADHD Daughter    Colon cancer Neg Hx    Colon polyps Neg Hx    Esophageal cancer Neg Hx    Rectal cancer Neg Hx      Review of Systems: Review of Systems  Constitutional:  Negative for fever, malaise/fatigue and weight loss.  Respiratory:  Negative for cough, shortness of breath and wheezing.   Cardiovascular:  Negative for chest pain, palpitations and leg swelling.  Gastrointestinal:  Negative for abdominal pain and blood in stool.  Genitourinary:  Negative for dysuria.  Musculoskeletal:  Negative for myalgias.  Skin:  Negative for rash.  Neurological:  Negative for dizziness and headaches.  Endo/Heme/Allergies:  Does not bruise/bleed easily.       + hot flashes  Psychiatric/Behavioral:  Negative for depression. The patient is not nervous/anxious.      OBJECTIVE Physical Exam: Vitals:   03/28/24 0957  BP: 130/83  Pulse: 86  Weight: 197 lb 6.4 oz (89.5 kg)  Height: 5' 3 (1.6 m)    Physical Exam Vitals reviewed. Exam conducted with a chaperone present.  Constitutional:      General: She is not in acute distress. Pulmonary:     Effort: Pulmonary effort is normal.  Abdominal:     General: There is no distension.     Palpations: Abdomen is soft.     Tenderness: There is no abdominal tenderness. There is no rebound.  Musculoskeletal:        General: No swelling. Normal range of motion.  Skin:    General: Skin is warm and dry.     Findings: No rash.  Neurological:     Mental Status: She is alert and  oriented to person, place, and time.  Psychiatric:        Mood and Affect: Mood normal.        Behavior: Behavior normal.     GU / Detailed Urogynecologic Evaluation:  Pelvic Exam: Normal external female genitalia; Bartholin's and Skene's glands normal in appearance; urethral meatus normal in appearance, no urethral masses or discharge.   CST: negative  s/p hysterectomy: Speculum exam reveals normal vaginal mucosa without  atrophy and normal vaginal cuff.  Adnexa no mass, fullness, tenderness.    Pelvic floor strength I/V, puborectalis III/V external anal sphincter IV/V  Pelvic floor musculature: Right levator tender, Right obturator non-tender, Left levator non-tender, Left obturator non-tender  POP-Q:   POP-Q  -2.5                                            Aa   -2.5                                           Ba  -8.5                                              C   4.5                                            Gh  4.5                                            Pb  9                                            tvl   -1.5                                            Ap  -1.5                                            Bp                                                 D      Rectal Exam:  Normal sphincter tone, small distal rectocele, enterocoele not present, no rectal masses, noted dyssynergia when asking the patient to bear down.  Post-Void Residual (PVR) by Bladder Scan: In order to evaluate bladder emptying, we discussed obtaining a postvoid residual and patient agreed to this procedure.  Procedure: The ultrasound unit was placed on the patient's abdomen in the suprapubic region after the patient had voided.    Post Void Residual - 03/28/24 1005       Post Void Residual   Post Void Residual 15 mL           Laboratory Results: Lab Results  Component Value Date   COLORU yellow 03/28/2024   CLARITYU clear 03/28/2024   GLUCOSEUR negative 03/28/2024    BILIRUBINUR negative 03/28/2024   KETONESU Negative 12/20/2023   SPECGRAV 1.025 03/28/2024   RBCUR negative 03/28/2024   PHUR 7.0 03/28/2024   PROTEINUR Negative 12/20/2023   UROBILINOGEN 1.0 03/28/2024   LEUKOCYTESUR Negative 03/28/2024    Lab Results  Component Value Date   CREATININE 0.57 03/18/2024   CREATININE 0.54 (L) 02/23/2024   CREATININE 0.60 12/20/2023    Lab Results  Component Value Date   HGBA1C 5.0 03/18/2024    Lab Results  Component Value Date   HGB 14.4 03/18/2024     ASSESSMENT AND PLAN Ms. Grennan is a 34 y.o. with:  1. Prolapse of posterior vaginal wall   2. Chronic idiopathic constipation   3. Dysuria     Prolapse of posterior vaginal wall Assessment & Plan: - Stage I-II posterior prolapse on exam today.  - We discussed she does not have a significant bulge so unclear if surgery will completely fix her defecatory dysfunction. Recommended combination of surgery and pelvic physical therapy, referral placed. She will plan to start pelvic PT after surgery and recovery.  - Plan for surgery: Exam under anesthesia, posterior repair and perineorrhaphy  - We reviewed the patient's specific anatomic and functional findings, with the assistance of diagrams, and together finalized the above procedure. The planned surgical procedures were discussed along with the surgical risks outlined below, which  were also provided on a detailed handout. Additional treatment options including expectant management, conservative management, medical management were discussed where appropriate.  We reviewed the benefits and risks of each treatment option.   General Surgical Risks: For all procedures, there are risks of bleeding, infection, damage to surrounding organs including but not limited to bowel, bladder, blood vessels, ureters and nerves, and need for further surgery if an injury were to occur. These risks are all low with minimally invasive surgery.   There are risks of  numbness and weakness at any body site or buttock/rectal pain.  It is possible that baseline pain can be worsened by surgery, either with or without mesh. If surgery is vaginal, there is also a low risk of possible conversion to laparoscopy or open abdominal incision where indicated. Very rare risks include blood transfusion, blood clot, heart attack, pneumonia, or death.   There is also a risk of short-term postoperative urinary retention with need to use a catheter. About half of patients need to go home from surgery with a catheter, which is then later removed in the office. The risk of long-term need for a catheter is very low. There is also a risk of worsening of overactive bladder.  Prolapse (with or without mesh): Risk factors for surgical failure  include things that put pressure on your pelvis and the surgical repair, including obesity, chronic cough, and heavy lifting or straining (including lifting children or adults, straining on the toilet, or lifting heavy objects such as furniture or anything weighing >25 lbs. Risks of recurrence is 20-30% with vaginal native tissue repair and a less than 10% with sacrocolpopexy with mesh.   - For preop Visit:  She is required to have a visit within 30 days of her surgery.   - Medical clearance: not required  - Anticoagulant use: No - Medicaid Hysterectomy form: n/a - Accepts blood transfusion: Yes - Expected length of stay: outpatient  Request sent for surgery scheduling.    Chronic idiopathic constipation Assessment & Plan: - Currently on regimen of stool softener and linzess .  - Has some pelvic floor dyssynergia so could benefit from pelvic PT as well - We also discussed the importance of avoiding chronic straining, as it can exacerbate her pelvic floor symptoms; we discussed treating constipation and straining prior to surgery, as postoperative straining can lead to damage to the repair and recurrence of symptoms.   Orders: -     AMB  referral to rehabilitation  Dysuria -     POCT URINALYSIS DIP (CLINITEK)     Rosaline LOISE Caper, MD        [1]  Social History Tobacco Use   Smoking status: Never   Smokeless tobacco: Never  Vaping Use   Vaping status: Never Used  Substance Use Topics   Alcohol use: Yes    Comment: occasionally   Drug use: Never   "

## 2024-03-28 NOTE — Assessment & Plan Note (Signed)
-   Currently on regimen of stool softener and linzess .  - Has some pelvic floor dyssynergia so could benefit from pelvic PT as well - We also discussed the importance of avoiding chronic straining, as it can exacerbate her pelvic floor symptoms; we discussed treating constipation and straining prior to surgery, as postoperative straining can lead to damage to the repair and recurrence of symptoms.

## 2024-03-28 NOTE — Assessment & Plan Note (Addendum)
-   Stage I-II posterior prolapse on exam today.  - We discussed she does not have a significant bulge so unclear if surgery will completely fix her defecatory dysfunction. Recommended combination of surgery and pelvic physical therapy, referral placed. She will plan to start pelvic PT after surgery and recovery.  - Plan for surgery: Exam under anesthesia, posterior repair and perineorrhaphy  - We reviewed the patient's specific anatomic and functional findings, with the assistance of diagrams, and together finalized the above procedure. The planned surgical procedures were discussed along with the surgical risks outlined below, which were also provided on a detailed handout. Additional treatment options including expectant management, conservative management, medical management were discussed where appropriate.  We reviewed the benefits and risks of each treatment option.   General Surgical Risks: For all procedures, there are risks of bleeding, infection, damage to surrounding organs including but not limited to bowel, bladder, blood vessels, ureters and nerves, and need for further surgery if an injury were to occur. These risks are all low with minimally invasive surgery.   There are risks of numbness and weakness at any body site or buttock/rectal pain.  It is possible that baseline pain can be worsened by surgery, either with or without mesh. If surgery is vaginal, there is also a low risk of possible conversion to laparoscopy or open abdominal incision where indicated. Very rare risks include blood transfusion, blood clot, heart attack, pneumonia, or death.   There is also a risk of short-term postoperative urinary retention with need to use a catheter. About half of patients need to go home from surgery with a catheter, which is then later removed in the office. The risk of long-term need for a catheter is very low. There is also a risk of worsening of overactive bladder.  Prolapse (with or without  mesh): Risk factors for surgical failure  include things that put pressure on your pelvis and the surgical repair, including obesity, chronic cough, and heavy lifting or straining (including lifting children or adults, straining on the toilet, or lifting heavy objects such as furniture or anything weighing >25 lbs. Risks of recurrence is 20-30% with vaginal native tissue repair and a less than 10% with sacrocolpopexy with mesh.   - For preop Visit:  She is required to have a visit within 30 days of her surgery.   - Medical clearance: not required  - Anticoagulant use: No - Medicaid Hysterectomy form: n/a - Accepts blood transfusion: Yes - Expected length of stay: outpatient  Request sent for surgery scheduling.

## 2024-03-28 NOTE — Patient Instructions (Signed)
You have a stage 1-2 (out of 4) prolapse.  We discussed the fact that it is not life threatening but there are several treatment options. For treatment of pelvic organ prolapse, we discussed options for management including expectant management, conservative management, and surgical management, such as Kegels, a pessary, pelvic floor physical therapy, and specific surgical procedures.

## 2024-03-29 ENCOUNTER — Encounter: Payer: Self-pay | Admitting: Physician Assistant

## 2024-03-29 ENCOUNTER — Other Ambulatory Visit (HOSPITAL_BASED_OUTPATIENT_CLINIC_OR_DEPARTMENT_OTHER): Payer: Self-pay

## 2024-03-29 ENCOUNTER — Ambulatory Visit: Admitting: Physician Assistant

## 2024-03-29 VITALS — BP 130/86 | HR 90 | Temp 98.4°F | Resp 14 | Ht 63.0 in | Wt 197.0 lb

## 2024-03-29 DIAGNOSIS — J060 Acute laryngopharyngitis: Secondary | ICD-10-CM

## 2024-03-29 DIAGNOSIS — J020 Streptococcal pharyngitis: Secondary | ICD-10-CM

## 2024-03-29 LAB — URINE CULTURE

## 2024-03-29 MED ORDER — PROMETHAZINE-DM 6.25-15 MG/5ML PO SYRP
5.0000 mL | ORAL_SOLUTION | Freq: Four times a day (QID) | ORAL | 0 refills | Status: AC | PRN
  Filled 2024-03-29: qty 118, 6d supply, fill #0

## 2024-03-29 MED ORDER — AMOXICILLIN 875 MG PO TABS
875.0000 mg | ORAL_TABLET | Freq: Two times a day (BID) | ORAL | 0 refills | Status: DC
  Filled 2024-03-29: qty 20, 10d supply, fill #0

## 2024-03-29 NOTE — Assessment & Plan Note (Signed)
 Tylenol  as needed Orders:   promethazine -dextromethorphan  (PROMETHAZINE -DM) 6.25-15 MG/5ML syrup; Take 5 mLs by mouth 4 (four) times daily as needed.

## 2024-03-29 NOTE — Progress Notes (Signed)
 "  Acute Office Visit  Subjective:    Patient ID: Michelle Lamb, female    DOB: [birth year], 34 y.o.   MRN: [id number]  Chief Complaint  Patient presents with   Cough   chest congestion    HPI: Patient is in today for complaints of cough, congestion and scratchy throat since Wednesday.  Denies fever but has had malaise and sweats Is taking otc congestion /cough meds with minimal relief  Current Medications[1]  Allergies[2]  ROS CONSTITUTIONAL:see HPI E/N/T: see HPI CARDIOVASCULAR: Negative for chest pain,  RESPIRATORY: see HPI GASTROINTESTINAL: Negative for abdominal pain, acid reflux symptoms, constipation, diarrhea, nausea and vomiting.       Objective:    PHYSICAL EXAM:   BP 130/86   Pulse 90   Temp 98.4 F (36.9 C)   Resp 14   Ht 5' 3 (1.6 m)   Wt 197 lb (89.4 kg)   LMP 12/11/2022   SpO2 100%   BMI 34.90 kg/m    GEN: Well nourished, well developed, in no acute distress  HEENT: TMS normal - canals clear Oropharynx - uvula with erythema/pnd noted Cardiac: RRR; no murmurs,  Respiratory:  normal respiratory rate and pattern with no distress - normal breath sounds with no rales, rhonchi, wheezes or rubs        Assessment & Plan Acute laryngopharyngitis Tylenol  as needed Orders:   promethazine -dextromethorphan  (PROMETHAZINE -DM) 6.25-15 MG/5ML syrup; Take 5 mLs by mouth 4 (four) times daily as needed.  Strep pharyngitis Tylenol  as needed Orders:   amoxicillin  (AMOXIL ) 875 MG tablet; Take 1 tablet (875 mg total) by mouth 2 (two) times daily for 10 days.    Follow-up: Return if symptoms worsen or fail to improve.  An After Visit Summary was printed and given to the patient.  SARA R Zylon Creamer, PA-C [facility] [phone]    [1]  Current Outpatient Medications:    albuterol  (VENTOLIN  HFA) 108 (90 Base) MCG/ACT inhaler, Inhale 2 puffs into the lungs every 6 (six) hours as needed for wheezing or shortness of breath., Disp: 1 each,  Rfl: 5   amoxicillin  (AMOXIL ) 875 MG tablet, Take 1 tablet (875 mg total) by mouth 2 (two) times daily for 10 days., Disp: 20 tablet, Rfl: 0   amphetamine -dextroamphetamine  (ADDERALL) 20 MG tablet, Take 1 tablet (20 mg total) by mouth 2 (two) times daily., Disp: 60 tablet, Rfl: 0   Continuous Glucose Sensor (FREESTYLE LIBRE 3 SENSOR) MISC, Use every 14 days - apply as directed, Disp: 2 each, Rfl: 5   cyanocobalamin  (VITAMIN B12) 1000 MCG/ML injection, Inject 1 mL (1,000 mcg total) into the muscle every 30 (thirty) days., Disp: 1 mL, Rfl: 2   fluticasone  (FLONASE ) 50 MCG/ACT nasal spray, Place 2 sprays into both nostrils daily., Disp: 16 g, Rfl: 6   linaclotide  (LINZESS ) 290 MCG CAPS capsule, Take one capsule on an empty stomach 30 minutes prior to first meal of the day. Keep medicine in its original container., Disp: 90 capsule, Rfl: 4   LORazepam  (ATIVAN ) 1 MG tablet, Take 1 tablet (1 mg total) by mouth daily as needed., Disp: 30 tablet, Rfl: 1   losartan  (COZAAR ) 50 MG tablet, Take 1 tablet (50 mg total) by mouth daily., Disp: 30 tablet, Rfl: 1   meclizine  (ANTIVERT ) 25 MG tablet, Take 1 tablet (25 mg total) by mouth 3 (three) times daily as needed for dizziness., Disp: 30 tablet, Rfl: 0   Multiple Vitamin (MULTIVITAMIN WITH MINERALS) TABS tablet, Take 1 tablet by  mouth daily., Disp: , Rfl:    ondansetron  (ZOFRAN -ODT) 4 MG disintegrating tablet, Take 1 tablet (4 mg total) by mouth every 8 (eight) hours as needed for nausea or vomiting., Disp: 20 tablet, Rfl: 0   pantoprazole  (PROTONIX ) 40 MG tablet, Take 1 tablet (40 mg total) by mouth daily., Disp: 30 tablet, Rfl: 11   promethazine -dextromethorphan  (PROMETHAZINE -DM) 6.25-15 MG/5ML syrup, Take 5 mLs by mouth 4 (four) times daily as needed., Disp: 118 mL, Rfl: 0   Sodium Fluoride  (SODIUM FLUORIDE  5000 PPM) 1.1 % PSTE, Apply 1 application to the teeth twice a day; . Floss first, then brush 2 times daily with PreviDent toothpaste. Expectorate. Do not  rinse, use mouth rinse, eat, or drink for 30 min after use., Disp: 100 mL, Rfl: 5   venlafaxine  XR (EFFEXOR -XR) 75 MG 24 hr capsule, Take 3 capsules (225 mg total) by mouth daily., Disp: 90 capsule, Rfl: 0   Vitamin D , Ergocalciferol , (DRISDOL ) 1.25 MG (50000 UNIT) CAPS capsule, Take 1 capsule (50,000 Units total) by mouth 2 (two) times a week., Disp: 8 capsule, Rfl: 3 [2]  Allergies Allergen Reactions   Bactrim  [Sulfamethoxazole -Trimethoprim ] Hives   Dilaudid  [Hydromorphone ] Rash   Sulfa  Antibiotics Rash   "

## 2024-03-30 ENCOUNTER — Ambulatory Visit: Payer: Self-pay | Admitting: Family Medicine

## 2024-04-05 ENCOUNTER — Encounter: Payer: Self-pay | Admitting: Physician Assistant

## 2024-04-05 ENCOUNTER — Ambulatory Visit: Admitting: Physician Assistant

## 2024-04-05 ENCOUNTER — Other Ambulatory Visit (HOSPITAL_COMMUNITY): Payer: Self-pay

## 2024-04-05 ENCOUNTER — Other Ambulatory Visit (HOSPITAL_BASED_OUTPATIENT_CLINIC_OR_DEPARTMENT_OTHER): Payer: Self-pay

## 2024-04-05 ENCOUNTER — Telehealth (HOSPITAL_BASED_OUTPATIENT_CLINIC_OR_DEPARTMENT_OTHER): Payer: Self-pay | Admitting: Pharmacist

## 2024-04-05 VITALS — BP 130/80 | HR 99 | Temp 97.1°F | Resp 14 | Ht 63.0 in | Wt 197.0 lb

## 2024-04-05 DIAGNOSIS — K760 Fatty (change of) liver, not elsewhere classified: Secondary | ICD-10-CM

## 2024-04-05 DIAGNOSIS — R41840 Attention and concentration deficit: Secondary | ICD-10-CM

## 2024-04-05 DIAGNOSIS — F419 Anxiety disorder, unspecified: Secondary | ICD-10-CM

## 2024-04-05 DIAGNOSIS — N3000 Acute cystitis without hematuria: Secondary | ICD-10-CM

## 2024-04-05 LAB — POCT URINALYSIS DIP (CLINITEK)
Bilirubin, UA: NEGATIVE
Blood, UA: NEGATIVE
Glucose, UA: NEGATIVE mg/dL
Ketones, POC UA: NEGATIVE mg/dL
Leukocytes, UA: NEGATIVE
Nitrite, UA: NEGATIVE
Spec Grav, UA: 1.025
Urobilinogen, UA: 0.2 U/dL
pH, UA: 6

## 2024-04-05 MED ORDER — CLONAZEPAM 0.5 MG PO TABS
0.5000 mg | ORAL_TABLET | Freq: Every evening | ORAL | 1 refills | Status: AC | PRN
  Filled 2024-04-05: qty 30, 30d supply, fill #0

## 2024-04-05 MED ORDER — CIPROFLOXACIN HCL 500 MG PO TABS
500.0000 mg | ORAL_TABLET | Freq: Two times a day (BID) | ORAL | 0 refills | Status: AC
  Filled 2024-04-05: qty 6, 3d supply, fill #0

## 2024-04-05 MED ORDER — AMPHETAMINE-DEXTROAMPHETAMINE 20 MG PO TABS
20.0000 mg | ORAL_TABLET | Freq: Two times a day (BID) | ORAL | 0 refills | Status: AC
  Filled 2024-04-05: qty 60, 30d supply, fill #0

## 2024-04-05 MED ORDER — OZEMPIC (0.25 OR 0.5 MG/DOSE) 2 MG/3ML ~~LOC~~ SOPN
0.2500 mg | PEN_INJECTOR | SUBCUTANEOUS | 0 refills | Status: AC
  Filled 2024-04-05: qty 3, 42d supply, fill #0
  Filled 2024-04-05: qty 3, 30d supply, fill #0

## 2024-04-05 NOTE — Assessment & Plan Note (Addendum)
 Decrease fried/fatty foods in diet Start ozempic  Orders:   Semaglutide ,0.25 or 0.5MG /DOS, (OZEMPIC , 0.25 OR 0.5 MG/DOSE,) 2 MG/3ML SOPN; Inject 0.25 mg into the skin once a week.

## 2024-04-05 NOTE — Assessment & Plan Note (Addendum)
 Continue Adderall as directed Orders:   amphetamine -dextroamphetamine  (ADDERALL) 20 MG tablet; Take 1 tablet (20 mg total) by mouth 2 (two) times daily.

## 2024-04-05 NOTE — Assessment & Plan Note (Addendum)
 Stop ativan  Continue effexor  Start klonopin  qhs Orders:   clonazePAM  (KLONOPIN ) 0.5 MG tablet; Take 1 tablet (0.5 mg total) by mouth at bedtime as needed for anxiety

## 2024-04-05 NOTE — Telephone Encounter (Signed)
 Where is this request coming from? [facility] Medication: Ozempic  0.25mg  Prior authorization required? Yes If YES, on primary or secondary insurance? Primary Comments:

## 2024-04-05 NOTE — Progress Notes (Signed)
 "  Subjective:  Patient ID: Michelle Lamb, female    DOB: [birth year]  Age: 34 y.o. MRN: [id number]  Chief Complaint  Patient presents with   Discuss medication    HPI Pt was recently treated for uti and is on Amoxil  however she is having some dysuria and pressure with urination.  Has pain on right side of mid back at times.  Denies vaginal discharge or itching.  Urine culture done 1/23 showed Enterococcus  Pt requests refill of Adderall for ADD - medication working well  Pt would like to restart ozempic  - she had been on in the past because of fatty liver.  She states she would like to try medication again also to help with weight loss.  She is trying to watch diet and staying physically active  Pt with moderate anxiety at night.  She states overall doing well on Effexor  for her depression but having breakthrough anxiety symptoms - she uses ativan  as needed at night.  Would like to try different medication     12/21/2023   11:42 AM 10/06/2023   11:11 AM 09/27/2023    9:35 AM 05/29/2023    1:23 PM 10/19/2022    4:23 PM  Depression screen PHQ 2/9  Decreased Interest 2 0 3 2 1   Down, Depressed, Hopeless 3 0 2 2 1   PHQ - 2 Score 5 0 5 4 2   Altered sleeping 3  3 2 2   Tired, decreased energy 2  2 2 1   Change in appetite 1  0 0 1  Feeling bad or failure about yourself  2  1 1    Trouble concentrating 3  2 3  0  Moving slowly or fidgety/restless 0  0 0 0  Suicidal thoughts 1  0 0 0  PHQ-9 Score 17   13  12  6    Difficult doing work/chores Very difficult  Very difficult Very difficult Somewhat difficult     Data saved with a previous flowsheet row definition        11/14/2022   10:55 AM 12/14/2022   12:06 PM 05/29/2023    1:23 PM 10/06/2023   11:11 AM 12/21/2023   11:42 AM  Fall Risk  Falls in the past year? 0 0 0 0 0  Was there an injury with Fall? 0  0  0  0  0   Fall Risk Category Calculator 0 0 0 0 0  Patient at Risk for Falls Due to No Fall Risks No Fall Risks No Fall  Risks No Fall Risks No Fall Risks  Fall risk Follow up Falls evaluation completed Falls evaluation completed Falls evaluation completed Falls evaluation completed Falls evaluation completed     Data saved with a previous flowsheet row definition     ROS CONSTITUTIONAL: Negative for chills, fatigue, fever,  CARDIOVASCULAR: Negative for chest pain, dizziness, palpitations and pedal edema.  GASTROINTESTINAL: Negative for abdominal pain, acid reflux symptoms, constipation, diarrhea, nausea and vomiting.  GU - see HPI INTEGUMENTARY: Negative for rash.  NEUROLOGICAL: Negative for dizziness and headaches.  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   Blood transfusion without reported diagnosis 2018   postpartum   Chest pain of uncertain etiology 06/08/2022   06/15/22 Myocardial perfusion - normal, 09/15/22 long term heart monitor in Epic   Chronic kidney disease    right kidney at 35%   Complication of anesthesia    Slow to wake up   Constipation 06/28/2022   Diabetes mellitus without complication (HCC)    NOT AN ISSUE SINCE GASTRIC BYPASS IN 2022. 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  Report date is 02/23/2022.      The CancerNext-Expanded gene panel offered by [facility] and includes sequencing, rearrangement, and RNA analysis for the following 77 genes: AIP, ALK, APC, ATM, AXIN2,  BAP1, BARD1, BLM, BMPR1A, BRCA1, BRCA2, BRIP1, CDC73, CDH1, CDK4, CDKN1B, CDKN2A, CHEK2, CTNNA1, DICER1, FANCC, FH, FLCN, GALNT12, KIF1B, LZTR1,   Hepatic steatosis 09/08/2021   History of herpes simplex infection    History of partial nephrectomy 03/21/2022   Formatting of this note might be different from the original. 03/15/2021: Underwent right partial nephrectomy by Dr. Renda for right renal neoplasm. Pathology: Angiomyolipoma. Formatting of this note might be different from the original. 03/15/2021: Underwent right partial nephrectomy by Dr. Renda for right renal neoplasm. Pathology: Angiomyolipoma.   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   Morbid obesity (HCC) 06/02/2020   Neoplasm of right kidney    benign tumor of kidney   Obesity (BMI 30.0-34.9) 06/08/2022   Obstructive sleep apnea 10/29/2019   hx of before gastric bypass, no longer uses CPAP   Racing heart beat 08/22/2022   see 09/15/22 Long term heart monitor in Epic   Seasonal allergies    Sleep apnea    Ureteral obstruction, right 07/15/2021   Vaginal delivery 2009, 2010, 2015   Vitamin D  insufficiency 09/30/2020   Weight loss 09/30/2020   Objective:  PHYSICAL EXAM:   BP 130/80   Pulse 99   Temp (!) 97.1 F (36.2 C)   Resp 14   Ht 5' 3 (1.6 m)   Wt 197 lb (89.4 kg)   LMP 12/11/2022   SpO2 100%   BMI 34.90 kg/m    GEN: Well nourished, well developed, in no acute distress  Cardiac: RRR; no murmurs, rubs, or gallops,no edema -  Respiratory:  normal respiratory rate and pattern with no distress - normal breath sounds with no rales, rhonchi, wheezes or rubs Skin:  warm and dry, no rash  Neuro:  Alert and Oriented x 3,- CN II-Xii grossly intact Psych: euthymic mood, appropriate affect and demeanor  Office Visit on 04/05/2024  Component Date Value Ref Range Status   Color, UA 04/05/2024 yellow  yellow Final   Clarity, UA 04/05/2024 clear  clear Final   Glucose, UA 04/05/2024 negative  negative mg/dL Final   Bilirubin, UA 97/93/7973 negative  negative Final   Ketones, POC UA 04/05/2024 negative  negative mg/dL Final   Spec Grav, UA 97/93/7973  1.025  1.010 - 1.025 Final   Blood, UA 04/05/2024 negative  negative Final   pH, UA 04/05/2024 6.0  5.0 - 8.0 Final   POC PROTEIN,UA 04/05/2024 trace  negative, trace Final   Urobilinogen, UA 04/05/2024 0.2  0.2 or 1.0 E.U./dL Final   Nitrite, UA 97/93/7973 Negative  Negative Final   Leukocytes, UA 04/05/2024 Negative  Negative Final    Assessment & Plan Attention or concentration deficit Continue Adderall as directed Orders:   amphetamine -dextroamphetamine  (ADDERALL) 20 MG tablet; Take 1 tablet (20 mg total) by mouth 2 (two) times daily.  Acute cystitis without hematuria Stop amoxil  and switch to cipro  Orders:   ciprofloxacin  (CIPRO ) 500 MG tablet; Take 1 tablet (500 mg total) by mouth 2 (two) times daily for 3 days.   Urine Culture   POCT URINALYSIS DIP (CLINITEK)  Anxiety Stop ativan  Continue effexor  Start klonopin  qhs Orders:   clonazePAM  (KLONOPIN ) 0.5 MG tablet; Take 1 tablet (0.5 mg total) by mouth at bedtime as needed for anxiety  Hepatic steatosis Decrease fried/fatty foods in diet Start ozempic  Orders:   Semaglutide ,0.25 or 0.5MG /DOS, (OZEMPIC , 0.25 OR 0.5 MG/DOSE,) 2 MG/3ML SOPN; Inject 0.25 mg into the skin once a week.    Follow-up: Return for as  scheduled for next chronic visit.  An After Visit Summary was printed and given to the patient.  SARA R Zenaida Tesar, PA-C [facility] [phone]    [1]  Current Outpatient Medications:    ciprofloxacin  (CIPRO ) 500 MG  tablet, Take 1 tablet (500 mg total) by mouth 2 (two) times daily for 3 days., Disp: 6 tablet, Rfl: 0   clonazePAM  (KLONOPIN ) 0.5 MG tablet, Take 1 tablet (0.5 mg total) by mouth at bedtime as needed for anxiety, Disp: 30 tablet, Rfl: 1   Semaglutide ,0.25 or 0.5MG /DOS, (OZEMPIC , 0.25 OR 0.5 MG/DOSE,) 2 MG/3ML SOPN, Inject 0.25 mg into the skin once a week., Disp: 3 mL, Rfl: 0   albuterol  (VENTOLIN  HFA) 108 (90 Base) MCG/ACT inhaler, Inhale 2 puffs into the lungs every 6 (six) hours as needed for wheezing or shortness of breath., Disp: 1 each, Rfl: 5   amphetamine -dextroamphetamine  (ADDERALL) 20 MG tablet, Take 1 tablet (20 mg total) by mouth 2 (two) times daily., Disp: 60 tablet, Rfl: 0   Continuous Glucose Sensor (FREESTYLE LIBRE 3 SENSOR) MISC, Use every 14 days - apply as directed, Disp: 2 each, Rfl: 5   cyanocobalamin  (VITAMIN B12) 1000 MCG/ML injection, Inject 1 mL (1,000 mcg total) into the muscle every 30 (thirty) days., Disp: 1 mL, Rfl: 2   fluticasone  (FLONASE ) 50 MCG/ACT nasal spray, Place 2 sprays into both nostrils daily., Disp: 16 g, Rfl: 6   linaclotide  (LINZESS ) 290 MCG CAPS capsule, Take one capsule on an empty stomach 30 minutes prior to first meal of the day. Keep medicine in its original container., Disp: 90 capsule, Rfl: 4   losartan  (COZAAR ) 50 MG tablet, Take 1 tablet (50 mg total) by mouth daily., Disp: 30 tablet, Rfl: 1   meclizine  (ANTIVERT ) 25 MG tablet, Take 1 tablet (25 mg total) by mouth 3 (three) times daily as needed for dizziness., Disp: 30 tablet, Rfl: 0   Multiple Vitamin (MULTIVITAMIN WITH MINERALS) TABS tablet, Take 1 tablet by mouth daily., Disp: , Rfl:    ondansetron  (ZOFRAN -ODT) 4 MG disintegrating tablet, Take 1 tablet (4 mg total) by mouth every 8 (eight) hours as needed for nausea or vomiting., Disp: 20 tablet, Rfl: 0   pantoprazole  (PROTONIX ) 40 MG tablet,  Take 1 tablet (40 mg total) by mouth daily., Disp: 30 tablet, Rfl: 11   promethazine -dextromethorphan   (PROMETHAZINE -DM) 6.25-15 MG/5ML syrup, Take 5 mLs by mouth 4 (four) times daily as needed., Disp: 118 mL, Rfl: 0   Sodium Fluoride  (SODIUM FLUORIDE  5000 PPM) 1.1 % PSTE, Apply 1 application to the teeth twice a day; . Floss first, then brush 2 times daily with PreviDent toothpaste. Expectorate. Do not rinse, use mouth rinse, eat, or drink for 30 min after use., Disp: 100 mL, Rfl: 5   venlafaxine  XR (EFFEXOR -XR) 75 MG 24 hr capsule, Take 3 capsules (225 mg total) by mouth daily., Disp: 90 capsule, Rfl: 0   Vitamin D , Ergocalciferol , (DRISDOL ) 1.25 MG (50000 UNIT) CAPS capsule, Take 1 capsule (50,000 Units total) by mouth 2 (two) times a week., Disp: 8 capsule, Rfl: 3  "

## 2024-04-18 ENCOUNTER — Ambulatory Visit: Admitting: Physician Assistant

## 2024-04-23 ENCOUNTER — Ambulatory Visit (HOSPITAL_COMMUNITY): Admit: 2024-04-23 | Admitting: Obstetrics and Gynecology

## 2024-05-10 ENCOUNTER — Ambulatory Visit: Admitting: Obstetrics

## 2024-06-04 ENCOUNTER — Encounter: Admitting: Obstetrics and Gynecology

## 2024-10-07 ENCOUNTER — Encounter: Admitting: Physician Assistant
